# Patient Record
Sex: Female | Born: 1955 | Race: White | Hispanic: No | Marital: Single | Smoking: Current some day smoker
Health system: Southern US, Community
[De-identification: ages and names within clinical notes are randomized; demographics above are authoritative.]

## PROBLEM LIST (undated history)

## (undated) DIAGNOSIS — C50519 Malignant neoplasm of lower-outer quadrant of unspecified female breast: Secondary | ICD-10-CM

## (undated) DIAGNOSIS — G459 Transient cerebral ischemic attack, unspecified: Secondary | ICD-10-CM

## (undated) DIAGNOSIS — I671 Cerebral aneurysm, nonruptured: Secondary | ICD-10-CM

## (undated) DIAGNOSIS — I8390 Asymptomatic varicose veins of unspecified lower extremity: Secondary | ICD-10-CM

## (undated) DIAGNOSIS — T8859XA Other complications of anesthesia, initial encounter: Secondary | ICD-10-CM

## (undated) DIAGNOSIS — M1990 Unspecified osteoarthritis, unspecified site: Secondary | ICD-10-CM

## (undated) DIAGNOSIS — R52 Pain, unspecified: Secondary | ICD-10-CM

## (undated) DIAGNOSIS — F4323 Adjustment disorder with mixed anxiety and depressed mood: Secondary | ICD-10-CM

## (undated) DIAGNOSIS — C50919 Malignant neoplasm of unspecified site of unspecified female breast: Secondary | ICD-10-CM

## (undated) DIAGNOSIS — E785 Hyperlipidemia, unspecified: Secondary | ICD-10-CM

## (undated) DIAGNOSIS — T4145XA Adverse effect of unspecified anesthetic, initial encounter: Secondary | ICD-10-CM

## (undated) DIAGNOSIS — Z923 Personal history of irradiation: Secondary | ICD-10-CM

## (undated) DIAGNOSIS — D3A00 Benign carcinoid tumor of unspecified site: Secondary | ICD-10-CM

## (undated) DIAGNOSIS — Z9289 Personal history of other medical treatment: Secondary | ICD-10-CM

## (undated) DIAGNOSIS — J189 Pneumonia, unspecified organism: Secondary | ICD-10-CM

## (undated) DIAGNOSIS — B1910 Unspecified viral hepatitis B without hepatic coma: Secondary | ICD-10-CM

## (undated) DIAGNOSIS — N649 Disorder of breast, unspecified: Secondary | ICD-10-CM

## (undated) DIAGNOSIS — F419 Anxiety disorder, unspecified: Secondary | ICD-10-CM

## (undated) DIAGNOSIS — B1920 Unspecified viral hepatitis C without hepatic coma: Secondary | ICD-10-CM

## (undated) DIAGNOSIS — T7840XA Allergy, unspecified, initial encounter: Secondary | ICD-10-CM

## (undated) DIAGNOSIS — F329 Major depressive disorder, single episode, unspecified: Secondary | ICD-10-CM

## (undated) DIAGNOSIS — I10 Essential (primary) hypertension: Secondary | ICD-10-CM

## (undated) DIAGNOSIS — I639 Cerebral infarction, unspecified: Secondary | ICD-10-CM

## (undated) DIAGNOSIS — F32A Depression, unspecified: Secondary | ICD-10-CM

## (undated) DIAGNOSIS — R17 Unspecified jaundice: Secondary | ICD-10-CM

## (undated) HISTORY — DX: Hyperlipidemia, unspecified: E78.5

## (undated) HISTORY — PX: BREAST BIOPSY: SHX20

## (undated) HISTORY — DX: Transient cerebral ischemic attack, unspecified: G45.9

## (undated) HISTORY — DX: Asymptomatic varicose veins of unspecified lower extremity: I83.90

## (undated) HISTORY — DX: Unspecified jaundice: R17

## (undated) HISTORY — DX: Unspecified viral hepatitis C without hepatic coma: B19.20

## (undated) HISTORY — DX: Disorder of breast, unspecified: N64.9

## (undated) HISTORY — PX: DILATION AND CURETTAGE OF UTERUS: SHX78

---
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
## 2002-01-01 ENCOUNTER — Emergency Department (HOSPITAL_COMMUNITY): Admission: EM | Admit: 2002-01-01 | Discharge: 2002-01-01 | Payer: Self-pay | Admitting: Emergency Medicine

## 2002-01-01 ENCOUNTER — Encounter: Payer: Self-pay | Admitting: Emergency Medicine

## 2002-01-22 ENCOUNTER — Emergency Department (HOSPITAL_COMMUNITY): Admission: EM | Admit: 2002-01-22 | Discharge: 2002-01-22 | Payer: Self-pay | Admitting: Emergency Medicine

## 2002-01-23 ENCOUNTER — Emergency Department (HOSPITAL_COMMUNITY): Admission: EM | Admit: 2002-01-23 | Discharge: 2002-01-23 | Payer: Self-pay | Admitting: Emergency Medicine

## 2002-05-03 ENCOUNTER — Emergency Department (HOSPITAL_COMMUNITY): Admission: EM | Admit: 2002-05-03 | Discharge: 2002-05-03 | Payer: Self-pay | Admitting: Emergency Medicine

## 2002-06-08 ENCOUNTER — Emergency Department (HOSPITAL_COMMUNITY): Admission: EM | Admit: 2002-06-08 | Discharge: 2002-06-08 | Payer: Self-pay | Admitting: Emergency Medicine

## 2002-06-17 ENCOUNTER — Emergency Department (HOSPITAL_COMMUNITY): Admission: EM | Admit: 2002-06-17 | Discharge: 2002-06-17 | Payer: Self-pay | Admitting: Emergency Medicine

## 2003-02-10 ENCOUNTER — Emergency Department (HOSPITAL_COMMUNITY): Admission: EM | Admit: 2003-02-10 | Discharge: 2003-02-10 | Payer: Self-pay | Admitting: Emergency Medicine

## 2003-09-24 ENCOUNTER — Encounter: Admission: RE | Admit: 2003-09-24 | Discharge: 2003-09-24 | Payer: Self-pay | Admitting: Family Medicine

## 2004-03-30 ENCOUNTER — Emergency Department (HOSPITAL_COMMUNITY): Admission: EM | Admit: 2004-03-30 | Discharge: 2004-03-30 | Payer: Self-pay | Admitting: Emergency Medicine

## 2004-06-28 ENCOUNTER — Emergency Department (HOSPITAL_COMMUNITY): Admission: EM | Admit: 2004-06-28 | Discharge: 2004-06-28 | Payer: Self-pay | Admitting: Emergency Medicine

## 2007-10-30 ENCOUNTER — Emergency Department (HOSPITAL_COMMUNITY): Admission: EM | Admit: 2007-10-30 | Discharge: 2007-10-30 | Payer: Self-pay | Admitting: Emergency Medicine

## 2008-09-03 ENCOUNTER — Encounter: Admission: RE | Admit: 2008-09-03 | Discharge: 2008-09-03 | Payer: Self-pay | Admitting: Infectious Diseases

## 2008-09-07 ENCOUNTER — Encounter: Admission: RE | Admit: 2008-09-07 | Discharge: 2008-09-07 | Payer: Self-pay | Admitting: Infectious Diseases

## 2010-01-26 ENCOUNTER — Emergency Department (HOSPITAL_COMMUNITY): Admission: EM | Admit: 2010-01-26 | Discharge: 2010-01-26 | Payer: Self-pay | Admitting: Emergency Medicine

## 2010-09-11 DIAGNOSIS — I639 Cerebral infarction, unspecified: Secondary | ICD-10-CM

## 2010-09-11 DIAGNOSIS — I671 Cerebral aneurysm, nonruptured: Secondary | ICD-10-CM

## 2010-09-11 HISTORY — DX: Cerebral aneurysm, nonruptured: I67.1

## 2010-09-11 HISTORY — DX: Cerebral infarction, unspecified: I63.9

## 2010-10-01 ENCOUNTER — Encounter: Payer: Self-pay | Admitting: Family Medicine

## 2011-06-02 LAB — COMPREHENSIVE METABOLIC PANEL WITH GFR
ALT: 19
AST: 21
Albumin: 3.6
Alkaline Phosphatase: 54
BUN: 10
CO2: 24
Calcium: 8.8
Chloride: 108
Creatinine, Ser: 0.6
GFR calc Af Amer: >60
GFR calc non Af Amer: >60
Glucose, Bld: 108 — ABNORMAL HIGH
Potassium: 4
Sodium: 137
Total Bilirubin: 0.6
Total Protein: 6.3

## 2011-06-02 LAB — DIFFERENTIAL
Basophils Absolute: 0
Basophils Relative: 0
Eosinophils Absolute: 0.4
Eosinophils Relative: 4
Lymphocytes Relative: 25
Lymphs Abs: 2.3
Monocytes Absolute: 0.8
Monocytes Relative: 9
Neutro Abs: 5.7
Neutrophils Relative %: 62

## 2011-06-02 LAB — URINALYSIS, ROUTINE W REFLEX MICROSCOPIC
Nitrite: NEGATIVE
Specific Gravity, Urine: 1.025
Urobilinogen, UA: 0.2

## 2011-06-02 LAB — CBC
RBC: 4.47
WBC: 9.2

## 2011-06-12 DIAGNOSIS — G459 Transient cerebral ischemic attack, unspecified: Secondary | ICD-10-CM | POA: Insufficient documentation

## 2011-06-12 HISTORY — DX: Transient cerebral ischemic attack, unspecified: G45.9

## 2011-06-26 ENCOUNTER — Emergency Department (HOSPITAL_COMMUNITY): Payer: Self-pay

## 2011-06-26 ENCOUNTER — Emergency Department (HOSPITAL_COMMUNITY)
Admission: EM | Admit: 2011-06-26 | Discharge: 2011-06-27 | Disposition: A | Discharge status: Home / Self Care | Payer: Self-pay | Attending: Emergency Medicine | Admitting: Emergency Medicine

## 2011-06-26 DIAGNOSIS — R209 Unspecified disturbances of skin sensation: Secondary | ICD-10-CM | POA: Insufficient documentation

## 2011-06-26 DIAGNOSIS — R10812 Left upper quadrant abdominal tenderness: Secondary | ICD-10-CM | POA: Insufficient documentation

## 2011-06-26 DIAGNOSIS — G8929 Other chronic pain: Secondary | ICD-10-CM | POA: Insufficient documentation

## 2011-06-26 DIAGNOSIS — R4789 Other speech disturbances: Secondary | ICD-10-CM | POA: Insufficient documentation

## 2011-06-26 DIAGNOSIS — Z79899 Other long term (current) drug therapy: Secondary | ICD-10-CM | POA: Insufficient documentation

## 2011-06-26 DIAGNOSIS — R5381 Other malaise: Secondary | ICD-10-CM | POA: Insufficient documentation

## 2011-06-26 DIAGNOSIS — R109 Unspecified abdominal pain: Secondary | ICD-10-CM | POA: Insufficient documentation

## 2011-06-26 DIAGNOSIS — Z8619 Personal history of other infectious and parasitic diseases: Secondary | ICD-10-CM | POA: Insufficient documentation

## 2011-06-26 DIAGNOSIS — R42 Dizziness and giddiness: Secondary | ICD-10-CM | POA: Insufficient documentation

## 2011-06-26 DIAGNOSIS — R262 Difficulty in walking, not elsewhere classified: Secondary | ICD-10-CM | POA: Insufficient documentation

## 2011-06-26 DIAGNOSIS — G459 Transient cerebral ischemic attack, unspecified: Secondary | ICD-10-CM | POA: Insufficient documentation

## 2011-06-26 LAB — COMPREHENSIVE METABOLIC PANEL
ALT: 29 U/L (ref 0–35)
AST: 23 U/L (ref 0–37)
CO2: 23 mEq/L (ref 19–32)
Calcium: 9.5 mg/dL (ref 8.4–10.5)
Chloride: 108 mEq/L (ref 96–112)
Creatinine, Ser: 0.53 mg/dL (ref 0.50–1.10)
GFR calc Af Amer: >90 mL/min (ref >90)
GFR calc non Af Amer: >90 mL/min (ref >90)
Glucose, Bld: 105 mg/dL — ABNORMAL HIGH (ref 70–99)
Total Bilirubin: 0.6 mg/dL (ref 0.3–1.2)

## 2011-06-26 LAB — BASIC METABOLIC PANEL
BUN: 10 mg/dL (ref 6–23)
Creatinine, Ser: 0.61 mg/dL (ref 0.50–1.10)
GFR calc Af Amer: >90 mL/min (ref >90)
GFR calc non Af Amer: >90 mL/min (ref >90)
Glucose, Bld: 102 mg/dL — ABNORMAL HIGH (ref 70–99)

## 2011-06-26 LAB — CBC
HCT: 40.6 % (ref 36.0–46.0)
Hemoglobin: 13.6 g/dL (ref 12.0–15.0)
MCH: 28.8 pg (ref 26.0–34.0)
MCHC: 33.5 g/dL (ref 30.0–36.0)
MCV: 86 fL (ref 78.0–100.0)
RDW: 13.7 % (ref 11.5–15.5)

## 2011-06-26 LAB — CK TOTAL AND CKMB (NOT AT ARMC)
CK, MB: 2.3 ng/mL (ref 0.3–4.0)
Relative Index: INVALID (ref 0.0–2.5)

## 2011-06-26 LAB — DIFFERENTIAL
Eosinophils Relative: 1 % (ref 0–5)
Lymphocytes Relative: 24 % (ref 12–46)
Lymphs Abs: 1.8 10*3/uL (ref 0.7–4.0)
Monocytes Absolute: 0.4 10*3/uL (ref 0.1–1.0)
Monocytes Relative: 5 % (ref 3–12)

## 2011-06-26 LAB — URINALYSIS, ROUTINE W REFLEX MICROSCOPIC
Bilirubin Urine: NEGATIVE
Hgb urine dipstick: NEGATIVE
Ketones, ur: NEGATIVE mg/dL
Nitrite: NEGATIVE
pH: 6.5 (ref 5.0–8.0)

## 2011-06-26 LAB — GLUCOSE, CAPILLARY: Glucose-Capillary: 137 mg/dL — ABNORMAL HIGH (ref 70–99)

## 2011-06-27 ENCOUNTER — Other Ambulatory Visit (HOSPITAL_COMMUNITY): Payer: Self-pay

## 2011-06-27 DIAGNOSIS — I2699 Other pulmonary embolism without acute cor pulmonale: Secondary | ICD-10-CM

## 2011-06-27 DIAGNOSIS — M79609 Pain in unspecified limb: Secondary | ICD-10-CM

## 2011-06-27 LAB — CK TOTAL AND CKMB (NOT AT ARMC): CK, MB: 2 ng/mL (ref 0.3–4.0)

## 2011-06-27 LAB — LIPID PANEL
Cholesterol: 245 mg/dL — ABNORMAL HIGH (ref 0–200)
HDL: 67 mg/dL (ref >39)
Total CHOL/HDL Ratio: 3.7 RATIO
VLDL: 27 mg/dL (ref 0–40)

## 2011-06-27 LAB — HEMOGLOBIN A1C: Mean Plasma Glucose: 114 mg/dL (ref <117)

## 2011-11-22 ENCOUNTER — Other Ambulatory Visit: Payer: Self-pay | Admitting: Obstetrics and Gynecology

## 2011-11-22 DIAGNOSIS — Z1231 Encounter for screening mammogram for malignant neoplasm of breast: Secondary | ICD-10-CM

## 2011-11-28 ENCOUNTER — Other Ambulatory Visit: Payer: Self-pay | Admitting: Obstetrics and Gynecology

## 2011-11-28 ENCOUNTER — Ambulatory Visit (HOSPITAL_COMMUNITY): Payer: Medicaid Other | Attending: Obstetrics and Gynecology

## 2011-11-28 ENCOUNTER — Ambulatory Visit (INDEPENDENT_AMBULATORY_CARE_PROVIDER_SITE_OTHER): Payer: Self-pay | Admitting: *Deleted

## 2011-11-28 VITALS — BP 121/72 | HR 81 | Temp 97.0°F | Ht 66.0 in | Wt 162.9 lb

## 2011-11-28 DIAGNOSIS — N63 Unspecified lump in unspecified breast: Secondary | ICD-10-CM

## 2011-11-28 DIAGNOSIS — Z1239 Encounter for other screening for malignant neoplasm of breast: Secondary | ICD-10-CM

## 2011-11-28 DIAGNOSIS — N6323 Unspecified lump in the left breast, lower outer quadrant: Secondary | ICD-10-CM

## 2011-11-28 DIAGNOSIS — N6320 Unspecified lump in the left breast, unspecified quadrant: Secondary | ICD-10-CM

## 2011-11-28 NOTE — Progress Notes (Signed)
Complaints of lump in left breast for past 5 years that has increased in size within the last year. Lump palpated by physician at the free Breast and Pap smear screening at the [facility] on 11/06/11.   Pap Smear:    Pap smear not performed today. Patients last Pap smear was 11/06/11 at the free cervical cancer screening at the [facility] and awaiting results. Per patient she has no history of abnormal Pap smears. No Pap smear results in EPIC.  Physical exam: Breasts Breasts symmetrical. No skin abnormalities bilateral breasts. No nipple retraction bilateral breasts. No nipple discharge bilateral breasts. No lymphadenopathy. No lumps palpated right breast. Complaints of tenderness when palpated right outer breast. Palpated lump in the left breast at 5 o'clock. Patient complained of tenderness when palpated lump. Small lump palpated under left outer breast at bra line.  Patient referred to the [facility] for Diagnostic Mammogram and left breast ultrasound Friday, December 01, 2011 at 1430.         Pelvic/Bimanual No Pap smear completed today since last Pap smear was 11/06/11. Pap smear not indicated per BCCCP guidelines.

## 2011-11-28 NOTE — Patient Instructions (Addendum)
Taught patient how to perform BSE. Patient did not need a Pap smear today due to last Pap smear was at the free Pap smear screening at the [facility] 11/06/11 per patient. Let her know BCCCP will cover Pap smears every 3 years unless has a history of abnormal Pap smears. Patient is scheduled for a diagnostic mammogram at the [facility] Friday, December 01, 2011 at 1430. Patient aware of appointment and will be there. Smoking Cessation counseling was done. Patient stated has ordered nicotine patches online and plans to quit tomorrow. Told her about the free smoking cessation classes at the [facility] if interested. Let patient know will follow up with her within the next couple weeks with results. Patient verbalized understanding.

## 2011-12-01 ENCOUNTER — Other Ambulatory Visit: Payer: Self-pay | Admitting: Obstetrics and Gynecology

## 2011-12-01 ENCOUNTER — Ambulatory Visit
Admission: RE | Admit: 2011-12-01 | Discharge: 2011-12-01 | Disposition: A | Discharge status: Home / Self Care | Payer: No Typology Code available for payment source | Source: Ambulatory Visit | Attending: Obstetrics and Gynecology | Admitting: Obstetrics and Gynecology

## 2011-12-01 DIAGNOSIS — N6323 Unspecified lump in the left breast, lower outer quadrant: Secondary | ICD-10-CM

## 2011-12-06 ENCOUNTER — Ambulatory Visit
Admission: RE | Admit: 2011-12-06 | Discharge: 2011-12-06 | Disposition: A | Discharge status: Home / Self Care | Payer: No Typology Code available for payment source | Source: Ambulatory Visit | Attending: Obstetrics and Gynecology | Admitting: Obstetrics and Gynecology

## 2011-12-06 ENCOUNTER — Other Ambulatory Visit: Payer: Self-pay | Admitting: Obstetrics and Gynecology

## 2011-12-06 DIAGNOSIS — N6323 Unspecified lump in the left breast, lower outer quadrant: Secondary | ICD-10-CM

## 2011-12-24 ENCOUNTER — Emergency Department (HOSPITAL_COMMUNITY): Payer: Medicaid Other

## 2011-12-24 ENCOUNTER — Encounter (HOSPITAL_COMMUNITY): Payer: Self-pay | Admitting: Emergency Medicine

## 2011-12-24 ENCOUNTER — Emergency Department (HOSPITAL_COMMUNITY)
Admission: EM | Admit: 2011-12-24 | Discharge: 2011-12-24 | Disposition: A | Discharge status: Home / Self Care | Payer: Medicaid Other | Attending: Emergency Medicine | Admitting: Emergency Medicine

## 2011-12-24 DIAGNOSIS — Z8619 Personal history of other infectious and parasitic diseases: Secondary | ICD-10-CM | POA: Insufficient documentation

## 2011-12-24 DIAGNOSIS — E785 Hyperlipidemia, unspecified: Secondary | ICD-10-CM | POA: Insufficient documentation

## 2011-12-24 DIAGNOSIS — F419 Anxiety disorder, unspecified: Secondary | ICD-10-CM

## 2011-12-24 DIAGNOSIS — Z8673 Personal history of transient ischemic attack (TIA), and cerebral infarction without residual deficits: Secondary | ICD-10-CM | POA: Insufficient documentation

## 2011-12-24 DIAGNOSIS — R5381 Other malaise: Secondary | ICD-10-CM | POA: Insufficient documentation

## 2011-12-24 DIAGNOSIS — Z79899 Other long term (current) drug therapy: Secondary | ICD-10-CM | POA: Insufficient documentation

## 2011-12-24 DIAGNOSIS — F411 Generalized anxiety disorder: Secondary | ICD-10-CM | POA: Insufficient documentation

## 2011-12-24 DIAGNOSIS — F17200 Nicotine dependence, unspecified, uncomplicated: Secondary | ICD-10-CM | POA: Insufficient documentation

## 2011-12-24 DIAGNOSIS — R4789 Other speech disturbances: Secondary | ICD-10-CM | POA: Insufficient documentation

## 2011-12-24 DIAGNOSIS — R531 Weakness: Secondary | ICD-10-CM

## 2011-12-24 LAB — CBC
MCH: 28.7 pg (ref 26.0–34.0)
MCV: 87.5 fL (ref 78.0–100.0)
Platelets: 241 10*3/uL (ref 150–400)
RDW: 13.5 % (ref 11.5–15.5)
WBC: 8 10*3/uL (ref 4.0–10.5)

## 2011-12-24 LAB — POCT I-STAT, CHEM 8
Calcium, Ion: 1.18 mmol/L (ref 1.12–1.32)
HCT: 40 % (ref 36.0–46.0)
TCO2: 20 mmol/L (ref 0–100)

## 2011-12-24 LAB — COMPREHENSIVE METABOLIC PANEL
ALT: 22 U/L (ref 0–35)
AST: 20 U/L (ref 0–37)
Alkaline Phosphatase: 67 U/L (ref 39–117)
CO2: 20 mEq/L (ref 19–32)
Chloride: 107 mEq/L (ref 96–112)
Creatinine, Ser: 0.78 mg/dL (ref 0.50–1.10)
GFR calc non Af Amer: >90 mL/min (ref >90)
Sodium: 138 mEq/L (ref 135–145)
Total Bilirubin: 0.3 mg/dL (ref 0.3–1.2)

## 2011-12-24 LAB — RAPID URINE DRUG SCREEN, HOSP PERFORMED
Amphetamines: NOT DETECTED
Barbiturates: NOT DETECTED
Benzodiazepines: POSITIVE — AB
Cocaine: NOT DETECTED
Tetrahydrocannabinol: NOT DETECTED

## 2011-12-24 LAB — GLUCOSE, CAPILLARY

## 2011-12-24 LAB — DIFFERENTIAL
Basophils Absolute: 0 10*3/uL (ref 0.0–0.1)
Lymphocytes Relative: 35 % (ref 12–46)
Monocytes Absolute: 0.4 10*3/uL (ref 0.1–1.0)
Neutro Abs: 4.7 10*3/uL (ref 1.7–7.7)

## 2011-12-24 LAB — PROTIME-INR
INR: 0.93 (ref 0.00–1.49)
Prothrombin Time: 12.7 seconds (ref 11.6–15.2)

## 2011-12-24 LAB — APTT: aPTT: 27 s (ref 24–37)

## 2011-12-24 MED ORDER — ALPRAZOLAM 0.5 MG PO TABS
0.5000 mg | ORAL_TABLET | Freq: Once | ORAL | Status: AC
  Administered 2011-12-24: 0.5 mg via ORAL
  Filled 2011-12-24: qty 1

## 2011-12-24 NOTE — Discharge Instructions (Signed)
The neurologist recommends that you take an enteric coated aspirin a day. Stop smoking. For anxiety/stress, get adequate sleep, eat balanced diet, try exercise/yoga. You may also take your xanax as need. No driving for the next 6 hours or when taking xanax.  Follow up with primary care doctor in coming week - see resource guide. Return to ER if worse, new symptoms, one sided weakness, change in speech or vision, severe anxiety or depression, other concern.     RESOURCE GUIDE  Dental Problems  Patients with Medicaid: [facility] [phone] [address]                                           [phone] [address] Phone:  [phone]                                                  Phone:  [phone]  If unable to pay or uninsured, contact:  [facility] or [facility]. to become qualified for the adult dental clinic.  Chronic Pain Problems Contact [facility]  [phone] Patients need to be referred by their primary care doctor.  Insufficient Money for Medicine Contact [employer] Way:  call "211" or [facility] 531-365-8766.  No Primary Care Doctor Call [facility]  9843301643 Other agencies that provide inexpensive medical care    [facility]  [phone]    [facility]  [phone]    [facility]  [phone]    [facility]  [phone]    [facility]  4180874654    [facility]  [phone]  Psychological Services [facility]  [phone] [facility]  [phone] [facility]   [phone] (emergency services [phone])  Substance Abuse Resources [facility]  [phone] [facility] [phone] [address] [place] [phone] Floydene Flock [phone] Residential & Outpatient Substance Abuse Program  [phone]  Abuse/Neglect [facility] Child Abuse Hotline (418)636-6750 [facility] Child Abuse Hotline (724)132-2589 (After Hours)  Emergency Shelter [facility] [phone]  Maternity Homes [facility] [place] [facility] [place] [phone] [facility] Services ([phone]  MRSA Hotline #:   862-383-8607    [facility] Resources  [facility] [place]     [employer]                          [facility] [address] [place]                       [address]      [address]  Patrecia Pace  Michell Heinrich Phone:  [phone]                                   Phone:  [phone]                 Phone:  [phone]  [facility] Phone:  [phone]  [facility] Child Abuse Hotline [phone] (704)[phone] (After Hours)         Anxiety and Panic Attacks Your caregiver has informed you that you are having an anxiety or panic attack. There may be many forms of this. Most of the time these attacks come suddenly and without warning. They come at any time of day, including periods of sleep, and at any time of life. They may be strong and unexplained. Although panic attacks are very scary, they are physically harmless. Sometimes the cause of your anxiety is not known. Anxiety is a protective mechanism of the body in its fight or flight mechanism. Most of these perceived danger situations are actually nonphysical situations (such as anxiety over losing a job). CAUSES  The causes of an anxiety or panic attack are many. Panic attacks may occur in otherwise healthy people given a certain set of circumstances. There may be a genetic cause for panic attacks. Some medications may also have anxiety as a side effect. SYMPTOMS  Some of the most common feelings are:  Intense terror.   Dizziness, feeling faint.   Hot and cold flashes.   Fear of going crazy.   Feelings that nothing is  real.   Sweating.   Shaking.   Chest pain or a fast heartbeat (palpitations).   Smothering, choking sensations.   Feelings of impending doom and that death is near.   Tingling of extremities, this may be from over-breathing.   Altered reality (derealization).   Being detached from yourself (depersonalization).  Several symptoms can be present to make up anxiety or panic attacks. DIAGNOSIS  The evaluation by your caregiver will depend on the type of symptoms you are experiencing. The diagnosis of anxiety or panic attack is made when no physical illness can be determined to be a cause of the symptoms. TREATMENT  Treatment to prevent anxiety and panic attacks may include:  Avoidance of circumstances that cause anxiety.   Reassurance and relaxation.   Regular exercise.   Relaxation therapies, such as yoga.   Psychotherapy with a psychiatrist or therapist.   Avoidance of caffeine, alcohol and illegal drugs.   Prescribed medication.  SEEK IMMEDIATE MEDICAL CARE IF:   You experience panic attack symptoms that are different than your usual symptoms.   You have any worsening or concerning symptoms.  Document Released: 08/28/2005 Document Revised: 08/17/2011 Document Reviewed: 12/30/2009 [employer] Patient Information 2012 [place], [place].

## 2011-12-24 NOTE — ED Notes (Signed)
Pt to room 6 from triage at 1900. Pt placed on zoll. Pt to CT 3 with RN and NT at 1901. Lab at bedside 1902. Rapid Response to bedside at 1903. Neuro at bedside 1904. Pt back to room 6 at 1909. IV established pt being placed in gown and on monitor.

## 2011-12-24 NOTE — ED Notes (Signed)
Pt remains alertx4 at this time. Still has some slurred speech, Movement in all extremities.

## 2011-12-24 NOTE — ED Notes (Signed)
Pt tolerated ambulation well. Pt states she did not want fluids at this time, but did tolerate fluids well during stroke swallow screen. Pt remains alertx4, NAD, only request is referral to [facility] for follow up.

## 2011-12-24 NOTE — ED Notes (Signed)
Code Stroke activated.  [facility] notified per RN karen N.

## 2011-12-24 NOTE — Code Documentation (Signed)
56 yo wf brought in via pvt vehicle for stroke symptoms. LSN unknown, Code stroke called 1900, Pt arrival 1852, EDP exam 1856, Stroke team arrival 1903, to CT 1901, lab 1902, Code stroke canceled 1935.  NIH 1 for mild sensory loss.

## 2011-12-24 NOTE — ED Notes (Addendum)
C/o difficulty speaking and L sided weakness onset at 5pm.  L grip weaker than R.  Code Stroke initiated and pt straight back.  Pt reports mild headache.

## 2011-12-24 NOTE — Consult Note (Signed)
Chief Complaint: Left-sided weakness and numbness  HPI: [name] is an 56 y.o. female history hyperlipidemia, TIA in October 2012, and chronic anxiety and depression, presenting with new onset of left-sided numbness as well as complaint of left-sided weakness. Exact onset is unclear. Patient was noted to be very anxious by her son who brought her to the emergency room. She was complaining of left side weakness and numbness as well as speech difficulty. She has not been compliant with taking aspirin but to aspirin tablets prior to coming to the emergency room. CT scan of the head showed no acute intracranial abnormality. 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 No intervention is indicated. NIH stroke score was 1 for subjective sensory changes on the left side  LSN: Approximately 3 PM today tPA Given: No: No clear objective deficits MRankin: 0  Past Medical History  Diagnosis Date  . Hepatitis C   . Jaundice   . Mental disorder     anxiety and depression  . Breast disorder     lump in left breast  . TIA (transient ischemic attack) OCT 2012  . Hyperlipidemia   . Varicose veins     Family History  Problem Relation Age of Onset  . Diabetes Paternal Grandfather   . Diabetes Paternal Grandmother   . Cancer Father     lung  . Diabetes Mother   . Heart disease Mother   . Cancer Mother     cervical  . Diabetes Brother   . Stroke Brother   . Heart disease Sister   . Diabetes Sister   . Cancer Sister     cervical     Medications: Prior to Admission:  Xanax 0.5 mg twice a day when necessary Norco 5 mg one tablet every 8 hours when necessary pain   Physical Examination: Blood pressure 166/83, pulse 67, temperature 97.9 F (36.6 C), temperature source Oral, resp. rate 20, SpO2 99.00%.  Neurologic Examination: Mental Status: Alert, oriented, thought content appropriate.  Speech fluent without evidence of aphasia. Able to follow commands  without difficulty. Cranial Nerves: II-Visual fields were normal. III/IV/VI-Pupils were equal and reacted. Extraocular movements were full and conjugate.    V/VII-inconsistent left facial sensory perception abnormality detected stimulation; no facial weakness. VIII-normal. X-normal speech and symmetrical palatal movement. XII-midline tongue extension Motor: Normal strength and muscle tone throughout Sensory: Reduced perception tactile sensation over left extremities compared to the right. Deep Tendon Reflexes: 2+ and symmetric. Plantars: Flexor bilaterally Cerebellar: Normal finger-to-nose and heel-to-shin testing.  Ct Head Wo Contrast  12/24/2011  *RADIOLOGY REPORT*  Clinical Data: Code stroke.  Left-sided weakness with slurred speech.  Cannot see out of left eye.  CT HEAD WITHOUT CONTRAST  Technique:  Contiguous axial images were obtained from the base of the skull through the vertex without contrast.  Comparison: Head CT 06/26/2011 brain MRI 06/26/2011  Findings: No intracranial hemorrhage is identified.  The ventricles are normal in stable in size.  There is no evidence of mass, mass effect, hydrocephalus, or evidence of acute cortically based infarction.  The visualized paranasal sinuses, mastoid air cells, and middle ears are clear.  The skull is intact.  IMPRESSION: No acute intracranial abnormality is identified. This report was called to [facility] in the ED and Dr. Roseanne Reno at 7:25 pm on 12/23/2012.  Original Report Authenticated By: Britta Mccreedy, M.D.    Assessment: 56 y.o. female presenting with subjective weakness and sensory changes involving the left  side. No objective deficit was demonstrated on examination. No clinical indications TIA nor acute stroke. CT scan of her head was unremarkable. Suspect significant psychophysiologic factors contributing to her presenting symptoms, consistent with her history of anxiety which according to family members has been worse than usual.   Stroke  Risk Factors - hyperlipidemia  Plan: 1. No further neurodiagnostic studies are indicated. 2. Increase Xanax to 1 mg twice a day when necessary anxiety 3. Resume aspirin 325 mg per day for TIA and stroke prophylaxis.  C.R. Roseanne Reno, MD [facility] [phone]  12/24/2011, 8:16 PM

## 2011-12-24 NOTE — ED Provider Notes (Signed)
History     CSN: [id number]  Arrival date & time 12/24/11  4098   First MD Initiated Contact with Patient 12/24/11 1901      Chief Complaint  Patient presents with  . Code Stroke    (Consider location/radiation/quality/duration/timing/severity/associated sxs/prior treatment) The history is provided by the patient.  pt with hx hep c, presents stating onset approximately 5 pm of slurred speech and left sided weakness. States symptoms persistent since onset but improving. Denies numbness. States had slight frontal headache earlier but that resolved. Denies change in vision. Denies trauma or fall. Denies problems w balance or coordination. No hx cva. States uncertain fam hx cva. Pt denies exacerbating or alleviating factors. States has been under great deal of stress lately.   Past Medical History  Diagnosis Date  . Hepatitis C   . Jaundice   . Mental disorder     anxiety and depression  . Breast disorder     lump in left breast  . TIA (transient ischemic attack) OCT 2012  . Hyperlipidemia   . Varicose veins     History reviewed. No pertinent past surgical history.  Family History  Problem Relation Age of Onset  . Diabetes Paternal Grandfather   . Diabetes Paternal Grandmother   . Cancer Father     lung  . Diabetes Mother   . Heart disease Mother   . Cancer Mother     cervical  . Diabetes Brother   . Stroke Brother   . Heart disease Sister   . Diabetes Sister   . Cancer Sister     cervical    History  Substance Use Topics  . Smoking status: Current Everyday Smoker -- 0.5 packs/day for 35 years  . Smokeless tobacco: Never Used  . Alcohol Use: Yes     seldom    OB History    Grav Para Term Preterm Abortions TAB SAB Ect Mult Living   5 4  2 1 1    4       Review of Systems  Constitutional: Negative for fever and chills.  HENT: Negative for neck pain.   Eyes: Negative for visual disturbance.  Respiratory: Negative for shortness of breath.   Cardiovascular:  Negative for chest pain.  Gastrointestinal: Negative for abdominal pain.  Genitourinary: Negative for flank pain.  Musculoskeletal: Negative for back pain.  Skin: Negative for rash.  Neurological: Negative for seizures and light-headedness.  Hematological: Does not bruise/bleed easily.  Psychiatric/Behavioral: Negative for agitation.    Allergies  Review of patient's allergies indicates no known allergies.  Home Medications   Current Outpatient Rx  Name Route Sig Dispense Refill  . ALPRAZOLAM 1 MG PO TABS Oral Take 1 mg by mouth 2 (two) times daily as needed.    Marland Kitchen HYDROCODONE-ACETAMINOPHEN 10-325 MG PO TABS Oral Take 1 tablet by mouth every 8 (eight) hours as needed.    Marland Kitchen LOVASTATIN 20 MG PO TABS Oral Take 20 mg by mouth at bedtime.    . SERTRALINE HCL 50 MG PO TABS Oral Take 50 mg by mouth daily.      BP 166/83  Pulse 67  Temp(Src) 98.3 F (36.8 C) (Oral)  Resp 20  SpO2 99%  Physical Exam  Nursing note and vitals reviewed. Constitutional: She is oriented to person, place, and time. She appears well-developed and well-nourished. No distress.  HENT:  Head: Atraumatic.  Nose: Nose normal.  Mouth/Throat: Oropharynx is clear and moist.       No temporal tenderness  Eyes: Conjunctivae and EOM are normal. Pupils are equal, round, and reactive to light. No scleral icterus.  Neck: Normal range of motion. Neck supple. No tracheal deviation present.       No bruit  Cardiovascular: Normal rate, regular rhythm, normal heart sounds and intact distal pulses.  Exam reveals no gallop and no friction rub.   No murmur heard. Pulmonary/Chest: Effort normal and breath sounds normal. No respiratory distress.  Abdominal: Soft. Normal appearance and bowel sounds are normal. She exhibits no distension. There is no tenderness.  Musculoskeletal: Normal range of motion. She exhibits no edema and no tenderness.  Neurological: She is alert and oriented to person, place, and time. She displays normal  reflexes. No cranial nerve deficit.       Pt w dysarthric quality of speech which changes from second to second, minute to minute during H and P. No facial droop/asymmetry. No cr n deficit noted. Motor intact bil. Pt noted to hold left arm up against gravity for 20+ seconds while described left weakness, when testing strength states unable to lift.   Skin: Skin is warm and dry. No rash noted.  Psychiatric:       Anxious, tearful.     ED Course  Procedures (including critical care time)   Labs Reviewed  GLUCOSE, CAPILLARY  CBC  DIFFERENTIAL  POCT I-STAT, CHEM 8  PROTIME-INR  APTT  COMPREHENSIVE METABOLIC PANEL  CK TOTAL AND CKMB  TROPONIN I  URINE RAPID DRUG SCREEN (HOSP PERFORMED)    Results for orders placed during the hospital encounter of 12/24/11  GLUCOSE, CAPILLARY      Component Value Range   Glucose-Capillary 98  70 - 99 (mg/dL)   Comment 1 Notify RN     Comment 2 Documented in Chart    CBC      Component Value Range   WBC 8.0  4.0 - 10.5 (K/uL)   RBC 4.49  3.87 - 5.11 (MIL/uL)   Hemoglobin 12.9  12.0 - 15.0 (g/dL)   HCT 16.1  09.6 - 04.5 (%)   MCV 87.5  78.0 - 100.0 (fL)   MCH 28.7  26.0 - 34.0 (pg)   MCHC 32.8  30.0 - 36.0 (g/dL)   RDW 40.9  81.1 - 91.4 (%)   Platelets 241  150 - 400 (K/uL)  DIFFERENTIAL      Component Value Range   Neutrophils Relative 58  43 - 77 (%)   Neutro Abs 4.7  1.7 - 7.7 (K/uL)   Lymphocytes Relative 35  12 - 46 (%)   Lymphs Abs 2.8  0.7 - 4.0 (K/uL)   Monocytes Relative 5  3 - 12 (%)   Monocytes Absolute 0.4  0.1 - 1.0 (K/uL)   Eosinophils Relative 2  0 - 5 (%)   Eosinophils Absolute 0.2  0.0 - 0.7 (K/uL)   Basophils Relative 0  0 - 1 (%)   Basophils Absolute 0.0  0.0 - 0.1 (K/uL)  POCT I-STAT, CHEM 8      Component Value Range   Sodium 143  135 - 145 (mEq/L)   Potassium 3.8  3.5 - 5.1 (mEq/L)   Chloride 112  96 - 112 (mEq/L)   BUN 11  6 - 23 (mg/dL)   Creatinine, Ser 7.82  0.50 - 1.10 (mg/dL)   Glucose, Bld 91  70 - 99  (mg/dL)   Calcium, Ion 9.56  2.13 - 1.32 (mmol/L)   TCO2 20  0 - 100 (mmol/L)   Hemoglobin  13.6  12.0 - 15.0 (g/dL)   HCT 45.4  09.8 - 11.9 (%)   Ct Head Wo Contrast  12/24/2011  *RADIOLOGY REPORT*  Clinical Data: Code stroke.  Left-sided weakness with slurred speech.  Cannot see out of left eye.  CT HEAD WITHOUT CONTRAST  Technique:  Contiguous axial images were obtained from the base of the skull through the vertex without contrast.  Comparison: Head CT 06/26/2011 brain MRI 06/26/2011  Findings: No intracranial hemorrhage is identified.  The ventricles are normal in stable in size.  There is no evidence of mass, mass effect, hydrocephalus, or evidence of acute cortically based infarction.  The visualized paranasal sinuses, mastoid air cells, and middle ears are clear.  The skull is intact.  IMPRESSION: No acute intracranial abnormality is identified. This report was called to [facility] in the ED and Dr. Roseanne Reno at 7:25 pm on 12/23/2012.  Original Report Authenticated By: Britta Mccreedy, M.D.    MDM  Pt called code stroke on arrival. Code stroke and neurologist in with patient.   Ct done. Labs pending.   Neurology has evaluated patient - Dr Roseanne Reno indicates no cva/tia, and that likely anxiety/stress reaction. States no further neuro workup needed, but states would advise to restart asa a day.   Of note, pt w same symptoms 10/12, had extensive workup then including ct, mri, mra, carotid dopplers, echo. Dr Roseanne Reno states the possible 2 mm cavernous ica anuerysm on prior mra of no clinical relevance and unrelated to todays symptoms.   Pt provided reassurance. Is anxious. Xanax po.     Date: 12/24/2011  Rate: 65  Rhythm: normal sinus rhythm  QRS Axis: normal  Intervals: normal  ST/T Wave abnormalities: normal  Conduction Disutrbances:none  Narrative Interpretation:   Old EKG Reviewed: unchanged    Pt ambulatory about the ED with steady gait. Calm and alert. No depressed mood/affect. States  feels ready for d/c.      Suzi Roots, MD 12/24/11 2217

## 2011-12-24 NOTE — ED Notes (Signed)
Pt reports had a headache earlier and took 2 ASAs. Pt reports vision is blurred to left side. EDP and Neuro at bedside. Pt reports tingling to left hand.

## 2011-12-25 LAB — GLUCOSE, CAPILLARY: Glucose-Capillary: 86 mg/dL (ref 70–99)

## 2012-03-11 ENCOUNTER — Emergency Department (HOSPITAL_COMMUNITY)
Admission: EM | Admit: 2012-03-11 | Discharge: 2012-03-11 | Discharge status: Against Medical Advice | Payer: Medicaid Other | Attending: Emergency Medicine | Admitting: Emergency Medicine

## 2012-03-11 ENCOUNTER — Encounter (HOSPITAL_COMMUNITY): Payer: Self-pay | Admitting: *Deleted

## 2012-03-11 DIAGNOSIS — R0602 Shortness of breath: Secondary | ICD-10-CM | POA: Insufficient documentation

## 2012-03-11 HISTORY — DX: Cerebral aneurysm, nonruptured: I67.1

## 2012-03-11 LAB — CBC
MCHC: 33.8 g/dL (ref 30.0–36.0)
MCV: 85.2 fL (ref 78.0–100.0)
Platelets: 203 10*3/uL (ref 150–400)
RDW: 13.7 % (ref 11.5–15.5)
WBC: 7.2 10*3/uL (ref 4.0–10.5)

## 2012-03-11 LAB — POCT PREGNANCY, URINE: Preg Test, Ur: NEGATIVE

## 2012-03-11 LAB — BASIC METABOLIC PANEL
BUN: 10 mg/dL (ref 6–23)
CO2: 21 mEq/L (ref 19–32)
Calcium: 9.5 mg/dL (ref 8.4–10.5)
Creatinine, Ser: 0.63 mg/dL (ref 0.50–1.10)
GFR calc Af Amer: >90 mL/min (ref >90)

## 2012-03-11 NOTE — ED Notes (Signed)
Pt reports possible spider bite on back of neck - states she woke up last night to insect crawling on neck. Reports stiff neck, throbbing headache, sob, chest "tightness" radiating across chest, swollen lymph node on right side of neck. States she tried to see pcp today, was seen by NP - confirmed spider bite, prescribed antibiotic and tramadol. Pt states hx of brain aneurysm, worried about headache.

## 2012-03-21 ENCOUNTER — Emergency Department (INDEPENDENT_AMBULATORY_CARE_PROVIDER_SITE_OTHER): Payer: Medicaid Other

## 2012-03-21 ENCOUNTER — Emergency Department (HOSPITAL_COMMUNITY)
Admission: EM | Admit: 2012-03-21 | Discharge: 2012-03-21 | Disposition: A | Discharge status: Home / Self Care | Payer: Medicaid Other | Source: Home / Self Care | Attending: Emergency Medicine | Admitting: Emergency Medicine

## 2012-03-21 ENCOUNTER — Encounter (HOSPITAL_COMMUNITY): Payer: Self-pay | Admitting: *Deleted

## 2012-03-21 DIAGNOSIS — L0390 Cellulitis, unspecified: Secondary | ICD-10-CM

## 2012-03-21 DIAGNOSIS — S92919A Unspecified fracture of unspecified toe(s), initial encounter for closed fracture: Secondary | ICD-10-CM

## 2012-03-21 DIAGNOSIS — Z23 Encounter for immunization: Secondary | ICD-10-CM

## 2012-03-21 DIAGNOSIS — T148XXA Other injury of unspecified body region, initial encounter: Secondary | ICD-10-CM

## 2012-03-21 MED ORDER — LIDOCAINE HCL (PF) 1 % IJ SOLN
INTRAMUSCULAR | Status: AC
  Filled 2012-03-21: qty 5

## 2012-03-21 MED ORDER — TETANUS-DIPHTH-ACELL PERTUSSIS 5-2.5-18.5 LF-MCG/0.5 IM SUSP
0.5000 mL | Freq: Once | INTRAMUSCULAR | Status: AC
  Administered 2012-03-21: 0.5 mL via INTRAMUSCULAR

## 2012-03-21 MED ORDER — AMOXICILLIN-POT CLAVULANATE 875-125 MG PO TABS: 1.0000 | ORAL_TABLET | Freq: Two times a day (BID) | ORAL | Status: AC

## 2012-03-21 MED ORDER — CEFTRIAXONE SODIUM 1 G IJ SOLR
1.0000 g | Freq: Once | INTRAMUSCULAR | Status: AC
  Administered 2012-03-21: 1 g via INTRAMUSCULAR

## 2012-03-21 MED ORDER — TETANUS-DIPHTH-ACELL PERTUSSIS 5-2.5-18.5 LF-MCG/0.5 IM SUSP
INTRAMUSCULAR | Status: AC
  Filled 2012-03-21: qty 0.5

## 2012-03-21 MED ORDER — CIPROFLOXACIN HCL 500 MG PO TABS: 500.0000 mg | ORAL_TABLET | Freq: Two times a day (BID) | ORAL | Status: AC

## 2012-03-21 MED ORDER — CEFTRIAXONE SODIUM 1 G IJ SOLR
INTRAMUSCULAR | Status: AC
  Filled 2012-03-21: qty 10

## 2012-03-21 MED ORDER — TRAMADOL HCL 50 MG PO TABS: 100.0000 mg | ORAL_TABLET | Freq: Three times a day (TID) | ORAL | Status: AC | PRN

## 2012-03-21 NOTE — ED Provider Notes (Signed)
Chief Complaint  Patient presents with  . Puncture Wound  . Foot Pain  . Foot Injury    History of Present Illness:  [name] is a 56 year old female who experienced a puncture wound to the plantar surface of her right foot 3 days ago. She was chasing a kitten and a stepped on a board that had to rest the nail is protruding. She was wearing a tennis shoe at the time. Her granddaughter pulled out the board with nails attached and she cleansed it with some soap and antibiotic ointment. Ever since then it's been painful, red, and swollen with some redness and swelling extending up onto the dorsum of the foot. She is on some clindamycin for a spider bite. She denies any fever or chills. There's no red streak running up her leg. No numbness or tingling. She cannot recall when her last tetanus vaccine was.  Because of the pain in the foot, she has been limping around the last couple of days and subsequently yesterday, struck the little toe of her right foot. She's had a little bit of pain over the little toe as well.  Review of Systems:  Other than noted above, the patient denies any of the following symptoms: Systemic:  No fever or chills. Musculoskeletal:  No joint pain or decreased range of motion. Neuro:  No numbness, tingling, or weakness.  PMFSH:  Past medical history, family history, social history, meds, and allergies were reviewed.  Physical Exam:   Vital signs:  BP 115/81  Pulse 68  Temp 97.6 F (36.4 C) (Oral)  Resp 18  SpO2 97% Ext:  She has 2 puncture wounds in the plantar surface of the foot near the metatarsal heads. There is no purulent drainage, there is some surrounding swelling, erythema, and tenderness to palpation. She also has tenderness to palpation over the dorsum of the foot as well measuring 5 x 5 cm. This was outlined with a skin marking pen.  All joints had a full ROM without pain.  Pulses were full.  Good capillary refill in all digits.  No edema. Neurological:  Alert  and oriented.  No muscle weakness.  Sensation was intact to light touch.    Plantar surface of foot showing 2 puncture wounds.   Dorsal surface of the foot showing swelling and erythema.   Dorsal surface of the foot after marking with a skin marking pen showing a 5 cm x 5 cm area of erythema.  Dg Foot Complete Right  03/21/2012  *RADIOLOGY REPORT*  Clinical Data: Rule out foreign body  RIGHT FOOT COMPLETE - 3+ VIEW  Comparison: None.  Findings: Nondisplaced transverse fracture through the proximal phalanx of the small toe.  Associated soft tissue swelling.  No radiopaque foreign body in the soft tissues.  IMPRESSION: Nondisplaced fracture involving the proximal phalanx of the small toe.  No definite radiopaque foreign body.  Original Report Authenticated By: Donavan Burnet, M.D.    X-ray of foot showing fracture of the proximal phalanx of the little toe.  Medications given in [facility]:  She was given a Tdap vaccine and Rocephin 1 g IM and tolerated these well without any immediate side effects.  Assessment:  The primary encounter diagnosis was Puncture wound. Diagnoses of Cellulitis and Fracture of toe were also pertinent to this visit. I do not think that the fracture of the little toe is directly caused by the puncture wound, but rather as a result of limping around on the foot because of the pain  and striking the little toe the day after the puncture wound.  Plan:   1.  The following meds were prescribed:   New Prescriptions   AMOXICILLIN-CLAVULANATE (AUGMENTIN) 875-125 MG PER TABLET    Take 1 tablet by mouth 2 (two) times daily.   CIPROFLOXACIN (CIPRO) 500 MG TABLET    Take 1 tablet (500 mg total) by mouth every 12 (twelve) hours.   TRAMADOL (ULTRAM) 50 MG TABLET    Take 2 tablets (100 mg total) by mouth every 8 (eight) hours as needed for pain.   2.  The patient was instructed in wound care and pain control, and handouts were given. 3.  The patient was told to return in 48 hours for  recheck.   Reuben Likes, MD 03/21/12 2030

## 2012-03-21 NOTE — ED Notes (Signed)
Pt stepped on two nails Tuesday has two puncture wounds bottom of right foot - now with pain/swelling/redness - last tetnus unknown

## 2012-03-24 ENCOUNTER — Encounter (HOSPITAL_COMMUNITY): Payer: Self-pay

## 2012-03-24 ENCOUNTER — Emergency Department (HOSPITAL_COMMUNITY)
Admission: EM | Admit: 2012-03-24 | Discharge: 2012-03-24 | Disposition: A | Discharge status: Home / Self Care | Payer: Medicaid Other | Source: Home / Self Care | Attending: Emergency Medicine | Admitting: Emergency Medicine

## 2012-03-24 DIAGNOSIS — T148XXA Other injury of unspecified body region, initial encounter: Secondary | ICD-10-CM

## 2012-03-24 NOTE — ED Provider Notes (Signed)
Chief Complaint  Patient presents with  . Follow-up    History of Present Illness:   [name] is a 55 -year-old female who was seen here 3 days ago with a puncture wound to her right foot.this had been sustained a couple days prior to her presentation here while she was chasing a cat. She actually had 2 puncture wounds on the plantar surface of the foot from to rusty nails nails that were sticking in a board. The reason that she came in was because the wounds were getting red and inflamed and additionally, she was developing some redness and inflammation over the dorsum of the foot. She was placed on Cipro since she was wearing tennis shoes at the time and cephalexin. She couldn't tolerate the Cipro because of some palpitations but she has been taking the cephalexin. It still little bit sore and tender to touch, but the swelling and redness have gone down and she denies any fever or red streak running up the leg. There's been no purulent drainage.   Last tetanus shot was given at her last visit here, 3 days ago.   Additionally, she was found to have a fracture of the proximal phalanx of her right little toe which he probably sustained while she was limping on her foot because of the pain. She has been wearing a buddy tape and a postoperative shoe.  Review of Systems:  Other than noted above, the patient denies any of the following symptoms: Systemic:  No fever or chills. Musculoskeletal:  No joint pain or decreased range of motion. Neuro:  No numbness, tingling, or weakness.  PMFSH:  Past medical history, family history, social history, meds, and allergies were reviewed.  Physical Exam:   Vital signs:  BP 154/93  Pulse 62  Temp 97.7 F (36.5 C) (Oral)  Resp 17  SpO2 98% Ext:   her foot looks good today. There is no swelling, redness, and only minimal tenderness to palpation around the puncture wounds themselves. There is no purulent drainage.   All joints had a full ROM without pain.  Pulses  were full.  Good capillary refill in all digits.  No edema.The right little toe was buddy taped and she is wearing a postoperative shoe.  Neurological:  Alert and oriented.  No muscle weakness.  Sensation was intact to light touch.   Assessment:  The encounter diagnosis was Puncture wound.  Plan:   1.  The following meds were prescribed:   New Prescriptions   No medications on file   2.  The patient was instructed in wound care and pain control, and handouts were given. 3.  The patient was told to return if any sign of infection.      Reuben Likes, MD 03/24/12 [phone]

## 2012-03-24 NOTE — ED Notes (Signed)
Pt here for follow-up for rt foot, states the redness is much improved.  She stopped the cipro after two doses because of tachycardia.

## 2012-10-22 ENCOUNTER — Encounter: Payer: Self-pay | Admitting: Internal Medicine

## 2012-11-06 DIAGNOSIS — R17 Unspecified jaundice: Secondary | ICD-10-CM | POA: Insufficient documentation

## 2012-11-06 DIAGNOSIS — I671 Cerebral aneurysm, nonruptured: Secondary | ICD-10-CM | POA: Insufficient documentation

## 2012-11-06 DIAGNOSIS — N6320 Unspecified lump in the left breast, unspecified quadrant: Secondary | ICD-10-CM | POA: Insufficient documentation

## 2012-11-06 DIAGNOSIS — E785 Hyperlipidemia, unspecified: Secondary | ICD-10-CM | POA: Insufficient documentation

## 2012-11-06 DIAGNOSIS — I8390 Asymptomatic varicose veins of unspecified lower extremity: Secondary | ICD-10-CM | POA: Insufficient documentation

## 2012-11-06 DIAGNOSIS — B1920 Unspecified viral hepatitis C without hepatic coma: Secondary | ICD-10-CM | POA: Insufficient documentation

## 2012-11-08 ENCOUNTER — Institutional Professional Consult (permissible substitution): Payer: Medicaid Other | Admitting: Cardiovascular Disease

## 2012-11-26 ENCOUNTER — Encounter: Payer: Self-pay | Admitting: Internal Medicine

## 2012-12-05 ENCOUNTER — Encounter: Payer: Self-pay | Admitting: Cardiovascular Disease

## 2012-12-05 ENCOUNTER — Ambulatory Visit (INDEPENDENT_AMBULATORY_CARE_PROVIDER_SITE_OTHER): Payer: Medicaid Other | Admitting: Cardiovascular Disease

## 2012-12-05 VITALS — BP 132/86 | HR 67 | Wt 152.0 lb

## 2012-12-05 DIAGNOSIS — F17200 Nicotine dependence, unspecified, uncomplicated: Secondary | ICD-10-CM

## 2012-12-05 DIAGNOSIS — I8390 Asymptomatic varicose veins of unspecified lower extremity: Secondary | ICD-10-CM

## 2012-12-05 DIAGNOSIS — I658 Occlusion and stenosis of other precerebral arteries: Secondary | ICD-10-CM

## 2012-12-05 DIAGNOSIS — Z720 Tobacco use: Secondary | ICD-10-CM | POA: Insufficient documentation

## 2012-12-05 DIAGNOSIS — G459 Transient cerebral ischemic attack, unspecified: Secondary | ICD-10-CM

## 2012-12-05 DIAGNOSIS — I6523 Occlusion and stenosis of bilateral carotid arteries: Secondary | ICD-10-CM

## 2012-12-05 DIAGNOSIS — E785 Hyperlipidemia, unspecified: Secondary | ICD-10-CM

## 2012-12-05 NOTE — Assessment & Plan Note (Signed)
Counseled Less than 1ppd Continue patch and E-cig

## 2012-12-05 NOTE — Patient Instructions (Addendum)
Your physician wants you to follow-up in:   YEAR  WITH  DR NISHAN You will receive a reminder letter in the mail two months in advance. If you don't receive a letter, please call our office to schedule the follow-up appointment. Your physician recommends that you continue on your current medications as directed. Please refer to the Current Medication list given to you today. Your physician has requested that you have a carotid duplex. This test is an ultrasound of the carotid arteries in your neck. It looks at blood flow through these arteries that supply the brain with blood. Allow one hour for this exam. There are no restrictions or special instructions.  

## 2012-12-05 NOTE — Assessment & Plan Note (Signed)
F/U primary intolerant to ? statins

## 2012-12-05 NOTE — Progress Notes (Signed)
Patient ID: [name], female   DOB: 1956-02-29, 57 y.o.   MRN: [id number] 57 y.o. referred by [facility] Georgette [facility] for HTN, elevated lipids and TIA.  She continues to smoke Counseled for less than 10 minutes on cesssation and trying patches with E-cig.  10/12 had ? TIA with dysequilibrium  Reviewed w/u at [facility] Echo normal, LE duplex with varicose veins.  Carotid with 40-59% RICA.  D/C with ASA.  Intolerant to multiple chol pills pain and abdominal bloating.  No chest pain dyspnea , palpitations or syncope.  Walks daily.  No motor deficits.    ROS: Denies fever, malais, weight loss, blurry vision, decreased visual acuity, cough, sputum, SOB, hemoptysis, pleuritic pain, palpitaitons, heartburn, abdominal pain, melena, lower extremity edema, claudication, or rash.  All other systems reviewed and negative   General: Affect appropriate Healthy:  appears stated age HEENT: normal Neck supple with no adenopathy JVP normal no bruits no thyromegaly Lungs clear with no wheezing and good diaphragmatic motion Heart:  S1/S2 no murmur,rub, gallop or click PMI normal Abdomen: benighn, BS positve, no tenderness, no AAA no bruit.  No HSM or HJR Distal pulses intact with no bruits No edema Neuro non-focal Skin warm and dry No muscular weakness  Medications Current Outpatient Prescriptions  Medication Sig Dispense Refill  . ALPRAZolam (XANAX) 1 MG tablet Take 1 mg by mouth 2 (two) times daily as needed. anxiety      . aspirin 325 MG tablet Take 325 mg by mouth daily.      Marland Kitchen oxyCODONE (OXYCONTIN) 10 MG 12 hr tablet Take 10 mg by mouth as needed for pain.       No current facility-administered medications for this visit.    Allergies Ciprofloxacin hcl  Family History: Family History  Problem Relation Age of Onset  . Diabetes Paternal Grandfather   . Diabetes Paternal Grandmother   . Cancer Father     lung  . Diabetes Mother   . Heart disease Mother   . Cancer Mother     cervical  .  Diabetes Brother   . Stroke Brother   . Heart disease Sister   . Diabetes Sister   . Cancer Sister     cervical    Social History: History   Social History  . Marital Status: Legally Separated    Spouse Name: N/A    Number of Children: N/A  . Years of Education: N/A   Occupational History  . Not on file.   Social History Main Topics  . Smoking status: Current Every Day Smoker -- 0.50 packs/day for 35 years  . Smokeless tobacco: Never Used  . Alcohol Use: Yes     Comment: seldom  . Drug Use: No  . Sexually Active: No   Other Topics Concern  . Not on file   Social History Narrative  . No narrative on file    Electrocardiogram: 03/11/12  SR rate 59 normal  Assessment and Plan

## 2012-12-05 NOTE — Assessment & Plan Note (Signed)
Non painful Support hose and elevate legs as needed

## 2012-12-05 NOTE — Assessment & Plan Note (Signed)
F/U carotid duplex for moderate Right disease  ASA

## 2012-12-10 ENCOUNTER — Encounter: Payer: Medicaid Other | Admitting: Internal Medicine

## 2012-12-12 ENCOUNTER — Encounter (INDEPENDENT_AMBULATORY_CARE_PROVIDER_SITE_OTHER): Payer: Medicaid Other

## 2012-12-12 DIAGNOSIS — I6529 Occlusion and stenosis of unspecified carotid artery: Secondary | ICD-10-CM

## 2012-12-12 DIAGNOSIS — I6523 Occlusion and stenosis of bilateral carotid arteries: Secondary | ICD-10-CM

## 2012-12-18 ENCOUNTER — Telehealth: Payer: Self-pay | Admitting: Internal Medicine

## 2012-12-18 NOTE — Telephone Encounter (Signed)
PT AWARE OF CAROTID RESULTS ./CY 

## 2012-12-18 NOTE — Telephone Encounter (Signed)
New Problem:    Patient called in returning your call. Please call back. 

## 2013-01-04 ENCOUNTER — Emergency Department (HOSPITAL_COMMUNITY): Payer: Medicaid Other

## 2013-01-04 ENCOUNTER — Inpatient Hospital Stay (HOSPITAL_COMMUNITY)
Admission: EM | Admit: 2013-01-04 | Discharge: 2013-01-09 | DRG: 389 | Disposition: A | Discharge status: Home / Self Care | Payer: Medicaid Other | Attending: Surgery | Admitting: Surgery

## 2013-01-04 ENCOUNTER — Encounter (HOSPITAL_COMMUNITY): Payer: Self-pay | Admitting: Emergency Medicine

## 2013-01-04 DIAGNOSIS — K56609 Unspecified intestinal obstruction, unspecified as to partial versus complete obstruction: Secondary | ICD-10-CM

## 2013-01-04 DIAGNOSIS — N63 Unspecified lump in unspecified breast: Secondary | ICD-10-CM | POA: Diagnosis present

## 2013-01-04 DIAGNOSIS — E785 Hyperlipidemia, unspecified: Secondary | ICD-10-CM | POA: Diagnosis present

## 2013-01-04 DIAGNOSIS — F4323 Adjustment disorder with mixed anxiety and depressed mood: Secondary | ICD-10-CM | POA: Diagnosis present

## 2013-01-04 DIAGNOSIS — Z7982 Long term (current) use of aspirin: Secondary | ICD-10-CM

## 2013-01-04 DIAGNOSIS — G894 Chronic pain syndrome: Secondary | ICD-10-CM | POA: Diagnosis present

## 2013-01-04 DIAGNOSIS — F17200 Nicotine dependence, unspecified, uncomplicated: Secondary | ICD-10-CM | POA: Diagnosis present

## 2013-01-04 DIAGNOSIS — B1920 Unspecified viral hepatitis C without hepatic coma: Secondary | ICD-10-CM | POA: Diagnosis present

## 2013-01-04 DIAGNOSIS — E041 Nontoxic single thyroid nodule: Secondary | ICD-10-CM | POA: Diagnosis present

## 2013-01-04 DIAGNOSIS — N6320 Unspecified lump in the left breast, unspecified quadrant: Secondary | ICD-10-CM | POA: Diagnosis present

## 2013-01-04 DIAGNOSIS — Z8673 Personal history of transient ischemic attack (TIA), and cerebral infarction without residual deficits: Secondary | ICD-10-CM

## 2013-01-04 DIAGNOSIS — K5900 Constipation, unspecified: Secondary | ICD-10-CM | POA: Diagnosis present

## 2013-01-04 DIAGNOSIS — R17 Unspecified jaundice: Secondary | ICD-10-CM | POA: Diagnosis present

## 2013-01-04 DIAGNOSIS — K5669 Other intestinal obstruction: Principal | ICD-10-CM | POA: Diagnosis present

## 2013-01-04 DIAGNOSIS — B182 Chronic viral hepatitis C: Secondary | ICD-10-CM | POA: Diagnosis present

## 2013-01-04 DIAGNOSIS — R197 Diarrhea, unspecified: Secondary | ICD-10-CM | POA: Diagnosis present

## 2013-01-04 DIAGNOSIS — D72829 Elevated white blood cell count, unspecified: Secondary | ICD-10-CM | POA: Diagnosis present

## 2013-01-04 HISTORY — DX: Adjustment disorder with mixed anxiety and depressed mood: F43.23

## 2013-01-04 LAB — COMPREHENSIVE METABOLIC PANEL
BUN: 12 mg/dL (ref 6–23)
CO2: 22 mEq/L (ref 19–32)
Calcium: 9.7 mg/dL (ref 8.4–10.5)
Creatinine, Ser: 0.73 mg/dL (ref 0.50–1.10)
GFR calc Af Amer: >90 mL/min (ref >90)
GFR calc non Af Amer: >90 mL/min (ref >90)
Glucose, Bld: 97 mg/dL (ref 70–99)
Sodium: 137 mEq/L (ref 135–145)
Total Protein: 7 g/dL (ref 6.0–8.3)

## 2013-01-04 LAB — CBC WITH DIFFERENTIAL/PLATELET
Eosinophils Absolute: 0.1 10*3/uL (ref 0.0–0.7)
Eosinophils Relative: 1 % (ref 0–5)
HCT: 40.8 % (ref 36.0–46.0)
Lymphocytes Relative: 17 % (ref 12–46)
Lymphs Abs: 1.9 10*3/uL (ref 0.7–4.0)
MCH: 28.9 pg (ref 26.0–34.0)
MCV: 83.1 fL (ref 78.0–100.0)
Monocytes Absolute: 0.6 10*3/uL (ref 0.1–1.0)
Monocytes Relative: 6 % (ref 3–12)
Platelets: 240 10*3/uL (ref 150–400)
RBC: 4.91 MIL/uL (ref 3.87–5.11)

## 2013-01-04 LAB — URINALYSIS, MICROSCOPIC ONLY
Nitrite: NEGATIVE
Specific Gravity, Urine: 1.034 — ABNORMAL HIGH (ref 1.005–1.030)
Urobilinogen, UA: 1 mg/dL (ref 0.0–1.0)
pH: 7 (ref 5.0–8.0)

## 2013-01-04 LAB — LIPASE, BLOOD: Lipase: 11 U/L (ref 11–59)

## 2013-01-04 MED ORDER — HYDROMORPHONE HCL PF 1 MG/ML IJ SOLN
1.0000 mg | Freq: Once | INTRAMUSCULAR | Status: AC
  Administered 2013-01-04: 1 mg via INTRAVENOUS
  Filled 2013-01-04: qty 1

## 2013-01-04 MED ORDER — ONDANSETRON HCL 4 MG/2ML IJ SOLN
4.0000 mg | Freq: Once | INTRAMUSCULAR | Status: AC
  Administered 2013-01-04: 4 mg via INTRAVENOUS
  Filled 2013-01-04: qty 2

## 2013-01-04 MED ORDER — SODIUM CHLORIDE 0.9 % IV BOLUS (SEPSIS)
1000.0000 mL | Freq: Once | INTRAVENOUS | Status: AC
  Administered 2013-01-04: 1000 mL via INTRAVENOUS

## 2013-01-04 MED ORDER — FLEET ENEMA 7-19 GM/118ML RE ENEM
1.0000 | ENEMA | Freq: Once | RECTAL | Status: AC
  Administered 2013-01-04: 19:00:00 via RECTAL
  Filled 2013-01-04: qty 1

## 2013-01-04 MED ORDER — SODIUM CHLORIDE 0.9 % IV SOLN
INTRAVENOUS | Status: DC
  Administered 2013-01-04 – 2013-01-05 (×2): via INTRAVENOUS

## 2013-01-04 MED ORDER — IOHEXOL 300 MG/ML  SOLN
100.0000 mL | Freq: Once | INTRAMUSCULAR | Status: AC | PRN
  Administered 2013-01-04: 100 mL via INTRAVENOUS

## 2013-01-04 MED ORDER — IOHEXOL 300 MG/ML  SOLN
50.0000 mL | Freq: Once | INTRAMUSCULAR | Status: AC | PRN
  Administered 2013-01-04: 50 mL via ORAL

## 2013-01-04 NOTE — ED Notes (Signed)
Dr. Effie Shy states to stop fluid challenge because pt was vomiting. Recommend NPO until further notice from general surgery.

## 2013-01-04 NOTE — ED Provider Notes (Signed)
[name] is a 57 y.o. female who reports onset of abdominal pain, one week ago. She states she has not had a bowel movement in several weeks. She took a suppository last night and got a very tiny amount of stool out. She has nausea and vomiting. She denies fever, chills  Abdomen normal bowel sounds, soft, mild diffuse tenderness. There is no palpable mass or hepatosplenomegaly Neurologic grossly nonfocal. Psychiatric she is very anxious.  Assessment: Nonspecific abdominal pain. She will require assessment by CT imaging    Reevaluation: 21:30- Still has severe abdominal pain, N/V. Refuses NG at this time.   Consultation; General Surgery- 21:35- Dr. Derrell Lolling will see the patient  Medical screening examination/treatment/procedure(s) were conducted as a shared visit with non-physician practitioner(s) and myself.  I personally evaluated the patient during the encounter  Flint Melter, MD 01/05/13 [phone]

## 2013-01-04 NOTE — ED Notes (Signed)
Ct notified that pt is ready for CT 

## 2013-01-04 NOTE — ED Notes (Signed)
Order clarification re: catheter care order. Verbal order per Dr. Effie Shy for urinary catheter and nasogastric tube.

## 2013-01-04 NOTE — ED Notes (Signed)
Pt NPO - No fluid challenge.

## 2013-01-04 NOTE — ED Provider Notes (Signed)
History     CSN: [id number]  Arrival date & time 01/04/13  1336   First MD Initiated Contact with Patient 01/04/13 1510      Chief Complaint  Patient presents with  . Abdominal Pain    (Consider location/radiation/quality/duration/timing/severity/associated sxs/prior treatment) HPI Pt is a 57yo female hx of Hep C, TIA, and HLD c/o sharp, tearing sensation in epigastric region associated with nausea and vomiting x3 that started yesterday. Unknown if emesis was blooding.  Pt states she felt she was constipated and used 2 suppositories, states she did have a BM but unable to describe stool.  Unknown blood or mucous. Pt took 10mg  oxycodone she has for OA, states this did not help.  Pain waxes and wanes, 10/10 at worse, 0/10 at best.  Reports possible pancreatitis in past and was told she may need splenectomy 17yrs ago for Hep C, however informed 71mo ago by PCP her blood work indicated no Hep C. Reports taking daily aspirin 325mg . Denies fever, diarrhea, urinary symptoms, sick contacts, or abdominal surgery.    Past Medical History  Diagnosis Date  . Hepatitis C   . Jaundice   . Breast disorder     lump in left breast  . TIA (transient ischemic attack) OCT 2012  . Hyperlipidemia   . Varicose veins   . Brain aneurysm 2012  . Adjustment disorder with mixed anxiety and depressed mood 01/05/2013    History reviewed. No pertinent past surgical history.  Family History  Problem Relation Age of Onset  . Diabetes Paternal Grandfather   . Diabetes Paternal Grandmother   . Cancer Father     lung  . Diabetes Mother   . Heart disease Mother   . Cancer Mother     cervical  . Diabetes Brother   . Stroke Brother   . Heart disease Sister   . Diabetes Sister   . Cancer Sister     cervical    History  Substance Use Topics  . Smoking status: Current Every Day Smoker -- 0.50 packs/day for 35 years  . Smokeless tobacco: Never Used  . Alcohol Use: Yes     Comment: seldom    OB History    Grav Para Term Preterm Abortions TAB SAB Ect Mult Living   5 4  2 1 1    4       Review of Systems  Constitutional: Negative for fever and chills.  Respiratory: Negative for chest tightness and shortness of breath.   Cardiovascular: Negative for chest pain.  Gastrointestinal: Positive for nausea, vomiting, abdominal pain and abdominal distention. Negative for diarrhea.  Genitourinary: Negative for dysuria and flank pain.  Musculoskeletal: Negative for back pain.  Skin: Negative.   All other systems reviewed and are negative.    Allergies  Ciprofloxacin hcl  Home Medications   No current outpatient prescriptions on file.  BP 112/74  Pulse 63  Temp(Src) 98 F (36.7 C) (Oral)  Resp 18  Ht 5\' 6"  (1.676 m)  Wt 157 lb 3 oz (71.3 kg)  BMI 25.38 kg/m2  SpO2 98%  Physical Exam  Nursing note and vitals reviewed. Constitutional: She appears well-developed and well-nourished. No distress.  Pt laying on right side in exam bed, hold emesis bag and rubbing abdomen.  HENT:  Head: Normocephalic and atraumatic.  Eyes: Conjunctivae are normal. No scleral icterus.  Neck: Normal range of motion. Neck supple.  Cardiovascular: Normal rate, regular rhythm and normal heart sounds.   Pulmonary/Chest: Effort normal and breath  sounds normal. No respiratory distress. She has no wheezes. She has no rales. She exhibits no tenderness.  Abdominal: Soft. She exhibits distension ( per pt). She exhibits no pulsatile liver, no abdominal bruit, no ascites, no pulsatile midline mass and no mass. Bowel sounds are decreased. There is no splenomegaly or hepatomegaly. There is tenderness in the epigastric area, periumbilical area and left upper quadrant. There is no rigidity, no rebound, no guarding and no CVA tenderness. No hernia.  Musculoskeletal: Normal range of motion.  Neurological: She is alert.  Skin: Skin is warm and dry. She is not diaphoretic.    ED Course  Procedures (including critical care  time)  Labs Reviewed  CBC WITH DIFFERENTIAL - Abnormal; Notable for the following:    WBC 11.4 (*)    Neutro Abs 8.8 (*)    All other components within normal limits  URINALYSIS, MICROSCOPIC ONLY - Abnormal; Notable for the following:    Color, Urine AMBER (*)    Specific Gravity, Urine 1.034 (*)    Bilirubin Urine SMALL (*)    Ketones, ur >80 (*)    Leukocytes, UA TRACE (*)    Squamous Epithelial / LPF MANY (*)    All other components within normal limits  URINALYSIS, ROUTINE W REFLEX MICROSCOPIC - Abnormal; Notable for the following:    Color, Urine AMBER (*)    Hgb urine dipstick MODERATE (*)    Bilirubin Urine SMALL (*)    Ketones, ur >80 (*)    Protein, ur 30 (*)    All other components within normal limits  URINE MICROSCOPIC-ADD ON - Abnormal; Notable for the following:    Squamous Epithelial / LPF FEW (*)    Bacteria, UA FEW (*)    All other components within normal limits  BASIC METABOLIC PANEL - Abnormal; Notable for the following:    Glucose, Bld 157 (*)    All other components within normal limits  COMPREHENSIVE METABOLIC PANEL  LIPASE, BLOOD  LACTIC ACID, PLASMA  CBC   Ct Abdomen Pelvis W Contrast  01/04/2013  **ADDENDUM** CREATED: 01/04/2013 21:28:38  In the body of the report, a left breast mass was described but inadvertently omitted from the impression.  The patient had a left breast biopsy in 2009, but the previously described findings do not appear to correlate with today's CT finding.  Outpatient nonemergent bilateral diagnostic mammography and possibly left ultrasound are recommended for further evaluation, if the patient has not had intercurrent mammography elsewhere since 2009 (that is the most recent date for which we have record of mammography at this institution).  These findings and recommendations called to Dr. Effie Shy by Dr. Chilton Si on 01/04/2013 at 9:30 p.m.  **END ADDENDUM** SIGNED BY: Harrel Lemon, M.D.   01/04/2013  *RADIOLOGY REPORT*  Clinical  Data: Abdominal pain, reported history of hepatitis C  CT ABDOMEN AND PELVIS WITH CONTRAST  Technique:  Multidetector CT imaging of the abdomen and pelvis was performed following the standard protocol during bolus administration of intravenous contrast.  Contrast: OMNIPAQUE IOHEXOL 300 MG/ML  SOLN  Comparison: Abdominal radiograph same date  Findings: Minimal dependent bibasilar atelectasis noted.  In the left breast inferiorly, there is a lobulated 2.6 cm mass. Lateral to this, there is metallic artifact image 5 likely from previous ultrasound guided core biopsy in 2009.  Lung bases are clear.  Trace perihepatic ascites is present. There are numerous dilated loops of small bowel to the level of the ileum, with abrupt transition point image 71  and distal terminal ileal decompression.  Small bowel fecalization is present with bowel wall thickening involving the dilated small bowel loops, measuring 7 mm.  Surrounding free fluid is evident.  Colon is unremarkable.  The appendix is normal.  No free air is identified.  Liver, gallbladder, adrenal glands, kidneys, spleen, and pancreas are normal.  No lymphadenopathy.  Mild atheromatous aortic calcification without aneurysm.  No acute osseous abnormality. Uterus and ovaries are normal.  IMPRESSION: Small bowel obstruction, wall edema, and free fluid to the level of a transition point in the lower right pelvis.  This may be due to adhesion or occult mass. There is a risk of bowel ischemia.  Findings called to Dr. Effie Shy by Dr. Chilton Si on 01/04/2013 at 9:15 p.m.   Original Report Authenticated By: Christiana Pellant, M.D.    Dg Abd Acute W/chest  01/04/2013  *RADIOLOGY REPORT*  Clinical Data: Abdominal pain and distention.  Nausea.  ACUTE ABDOMEN SERIES (ABDOMEN 2 VIEW & CHEST 1 VIEW)  Comparison: 06/28/2004 CT scan  Findings: Interstitial accentuation in the lungs may be from tobacco use. Chest otherwise unremarkable.  Abnormal air-fluid levels present in the left  abdomen, within mildly dilated loops of small bowel.  Some of these air-fluid levels are at differing vertical heights in the same loop.  There may be some degree of small bowel mucosal irregularity on the supine view and this vicinity.  No free intraperitoneal gas observed.  IMPRESSION:  1.  Abnormal bowel gas pattern with air-fluid levels in the left abdomen within mildly dilated loops of small bowel.  Differing vertical heights of small bowel air fluid levels may reflect early obstruction.   Original Report Authenticated By: Gaylyn Rong, M.D.    3:10 PM Pt states medication has decreased pain and nausea.  Still waiting for plain films.  3:10 PM Checked on pt to see why she hadn't gone to CT.  Pt refuses until pain and nausea are under control.  Asking for an enema.  Will give dilaudid, enema and zofran.    Pt did feel better and ready to go to CT.  1. Small bowel obstruction   2. Left breast mass       MDM  Pt PMH Hep C c/o severe abd pain x1 day associated with nausea, vomiting, and constipation.  Started on fluids, zofran, and dilaudid.  PE: abd: soft, distended (per pt), diffuse ttp, > in epigastrium.   Concern for pancreatitis,  SBO, or peptic ulcer.  Will get labs and acute abdominal series.  Initial BP improved after 1L fluids.  Labs relatively normal. Acute abd: possible early obstruction.  Discussed pt with Dr. Effie Shy. Will order CT for better dx.  CT: SBO with wall edema, & free fluid to level of transition point in lower right pelvis. May be due to adhesin or occult mass. Risk of bowel ischemia.    Dr. Effie Shy consulted general surgery.  Pt will be admitted for SBO.  Pt refused NG tube but agreed after speaking to surgery. Dr. Derrell Lolling agreed to admit pt to med-surg floor. Pt made NPO.  NG tube and foley catheter will be placed.  Pt still on IV pain medication and prn zofran.   Discussed pt with attending during ED encounter.         Junius Finner, PA-C 01/05/13  1511

## 2013-01-04 NOTE — ED Notes (Signed)
Pt. Stated, I started having Abd. Pain last night with nausea.  Its right on my stomach.  Its tender to touch.  I had some ginger ale and then I vomited up.

## 2013-01-05 ENCOUNTER — Encounter (HOSPITAL_COMMUNITY): Payer: Self-pay | Admitting: *Deleted

## 2013-01-05 DIAGNOSIS — F4323 Adjustment disorder with mixed anxiety and depressed mood: Secondary | ICD-10-CM

## 2013-01-05 DIAGNOSIS — K56609 Unspecified intestinal obstruction, unspecified as to partial versus complete obstruction: Secondary | ICD-10-CM | POA: Diagnosis present

## 2013-01-05 DIAGNOSIS — R109 Unspecified abdominal pain: Secondary | ICD-10-CM

## 2013-01-05 DIAGNOSIS — G894 Chronic pain syndrome: Secondary | ICD-10-CM

## 2013-01-05 DIAGNOSIS — E041 Nontoxic single thyroid nodule: Secondary | ICD-10-CM | POA: Insufficient documentation

## 2013-01-05 HISTORY — DX: Adjustment disorder with mixed anxiety and depressed mood: F43.23

## 2013-01-05 LAB — URINALYSIS, ROUTINE W REFLEX MICROSCOPIC
Ketones, ur: >80 mg/dL — AB
Nitrite: NEGATIVE
Protein, ur: 30 mg/dL — AB
Specific Gravity, Urine: 1.03 (ref 1.005–1.030)
Urobilinogen, UA: 1 mg/dL (ref 0.0–1.0)

## 2013-01-05 LAB — CBC
Hemoglobin: 13 g/dL (ref 12.0–15.0)
MCH: 28.6 pg (ref 26.0–34.0)
MCV: 83.9 fL (ref 78.0–100.0)
Platelets: 215 10*3/uL (ref 150–400)
RBC: 4.54 MIL/uL (ref 3.87–5.11)
WBC: 9.4 10*3/uL (ref 4.0–10.5)

## 2013-01-05 LAB — URINE MICROSCOPIC-ADD ON

## 2013-01-05 LAB — BASIC METABOLIC PANEL
CO2: 24 mEq/L (ref 19–32)
Calcium: 8.5 mg/dL (ref 8.4–10.5)
Chloride: 108 mEq/L (ref 96–112)
Glucose, Bld: 157 mg/dL — ABNORMAL HIGH (ref 70–99)
Potassium: 3.7 mEq/L (ref 3.5–5.1)
Sodium: 141 mEq/L (ref 135–145)

## 2013-01-05 MED ORDER — HYDROMORPHONE HCL PF 1 MG/ML IJ SOLN
1.0000 mg | INTRAMUSCULAR | Status: DC | PRN
  Administered 2013-01-05 (×3): 1 mg via INTRAVENOUS
  Administered 2013-01-06: 2 mg via INTRAVENOUS
  Administered 2013-01-07 – 2013-01-08 (×4): 1 mg via INTRAVENOUS
  Filled 2013-01-05: qty 2
  Filled 2013-01-05 (×5): qty 1
  Filled 2013-01-05: qty 2

## 2013-01-05 MED ORDER — MENTHOL 3 MG MT LOZG
1.0000 | LOZENGE | OROMUCOSAL | Status: DC | PRN
  Filled 2013-01-05: qty 9

## 2013-01-05 MED ORDER — BISACODYL 10 MG RE SUPP: 10.0000 mg | Freq: Two times a day (BID) | RECTAL | Status: DC | PRN

## 2013-01-05 MED ORDER — PANTOPRAZOLE SODIUM 40 MG IV SOLR
40.0000 mg | Freq: Every day | INTRAVENOUS | Status: DC
  Administered 2013-01-05 – 2013-01-08 (×4): 40 mg via INTRAVENOUS
  Filled 2013-01-05 (×5): qty 40

## 2013-01-05 MED ORDER — MAGIC MOUTHWASH: 15.0000 mL | Freq: Four times a day (QID) | ORAL | Status: DC | PRN

## 2013-01-05 MED ORDER — DIPHENHYDRAMINE HCL 50 MG/ML IJ SOLN
12.5000 mg | Freq: Four times a day (QID) | INTRAMUSCULAR | Status: DC | PRN
  Administered 2013-01-09: 25 mg via INTRAVENOUS
  Filled 2013-01-05: qty 1

## 2013-01-05 MED ORDER — BIOTENE DRY MOUTH MT LIQD
15.0000 mL | Freq: Two times a day (BID) | OROMUCOSAL | Status: DC
  Administered 2013-01-05 – 2013-01-06 (×3): 15 mL via OROMUCOSAL

## 2013-01-05 MED ORDER — ALUM & MAG HYDROXIDE-SIMETH 200-200-20 MG/5ML PO SUSP: 30.0000 mL | Freq: Four times a day (QID) | ORAL | Status: DC | PRN

## 2013-01-05 MED ORDER — KETOROLAC TROMETHAMINE 15 MG/ML IJ SOLN
15.0000 mg | Freq: Four times a day (QID) | INTRAMUSCULAR | Status: AC
  Administered 2013-01-05 – 2013-01-06 (×4): 15 mg via INTRAVENOUS
  Filled 2013-01-05: qty 1

## 2013-01-05 MED ORDER — LIP MEDEX EX OINT
1.0000 "application " | TOPICAL_OINTMENT | Freq: Two times a day (BID) | CUTANEOUS | Status: DC
  Filled 2013-01-05: qty 7

## 2013-01-05 MED ORDER — ACETAMINOPHEN 650 MG RE SUPP: 650.0000 mg | Freq: Four times a day (QID) | RECTAL | Status: DC | PRN

## 2013-01-05 MED ORDER — LORAZEPAM 2 MG/ML IJ SOLN
0.5000 mg | Freq: Three times a day (TID) | INTRAMUSCULAR | Status: DC | PRN
  Administered 2013-01-05 (×2): 1 mg via INTRAVENOUS
  Filled 2013-01-05 (×2): qty 1

## 2013-01-05 MED ORDER — CHLORHEXIDINE GLUCONATE 0.12 % MT SOLN
15.0000 mL | Freq: Two times a day (BID) | OROMUCOSAL | Status: DC
  Administered 2013-01-05 – 2013-01-06 (×3): 15 mL via OROMUCOSAL
  Filled 2013-01-05 (×3): qty 15

## 2013-01-05 MED ORDER — BLISTEX EX OINT
TOPICAL_OINTMENT | Freq: Two times a day (BID) | CUTANEOUS | Status: DC
  Administered 2013-01-05 – 2013-01-09 (×9): via TOPICAL
  Filled 2013-01-05: qty 10

## 2013-01-05 MED ORDER — ONDANSETRON HCL 4 MG/2ML IJ SOLN
4.0000 mg | Freq: Once | INTRAMUSCULAR | Status: AC
  Administered 2013-01-05: 4 mg via INTRAVENOUS
  Filled 2013-01-05: qty 2

## 2013-01-05 MED ORDER — KETOROLAC TROMETHAMINE 15 MG/ML IJ SOLN
15.0000 mg | Freq: Four times a day (QID) | INTRAMUSCULAR | Status: DC | PRN
  Filled 2013-01-05: qty 1
  Filled 2013-01-05: qty 2
  Filled 2013-01-05: qty 1

## 2013-01-05 MED ORDER — METOPROLOL TARTRATE 1 MG/ML IV SOLN: 5.0000 mg | Freq: Four times a day (QID) | INTRAVENOUS | Status: DC | PRN

## 2013-01-05 MED ORDER — LACTATED RINGERS IV BOLUS (SEPSIS)
1000.0000 mL | Freq: Three times a day (TID) | INTRAVENOUS | Status: AC | PRN
  Administered 2013-01-05 – 2013-01-06 (×2): 1000 mL via INTRAVENOUS

## 2013-01-05 MED ORDER — ONDANSETRON HCL 4 MG/2ML IJ SOLN
4.0000 mg | Freq: Four times a day (QID) | INTRAMUSCULAR | Status: DC | PRN
  Administered 2013-01-05 – 2013-01-06 (×3): 4 mg via INTRAVENOUS
  Filled 2013-01-05 (×3): qty 2

## 2013-01-05 MED ORDER — HYDROMORPHONE HCL PF 1 MG/ML IJ SOLN
1.0000 mg | Freq: Once | INTRAMUSCULAR | Status: AC
  Administered 2013-01-05: 1 mg via INTRAVENOUS
  Filled 2013-01-05: qty 1

## 2013-01-05 MED ORDER — HYDROMORPHONE HCL PF 1 MG/ML IJ SOLN
0.5000 mg | INTRAMUSCULAR | Status: DC | PRN
  Filled 2013-01-05: qty 1

## 2013-01-05 MED ORDER — BISACODYL 10 MG RE SUPP
10.0000 mg | Freq: Every day | RECTAL | Status: DC
  Administered 2013-01-05 – 2013-01-09 (×3): 10 mg via RECTAL
  Filled 2013-01-05 (×4): qty 1

## 2013-01-05 MED ORDER — DEXTROSE-NACL 5-0.9 % IV SOLN
INTRAVENOUS | Status: DC
  Administered 2013-01-05 – 2013-01-08 (×6): via INTRAVENOUS

## 2013-01-05 MED ORDER — HYDROMORPHONE HCL PF 1 MG/ML IJ SOLN
1.0000 mg | INTRAMUSCULAR | Status: DC | PRN
  Administered 2013-01-05 (×2): 1 mg via INTRAVENOUS
  Filled 2013-01-05 (×2): qty 1

## 2013-01-05 MED ORDER — PHENOL 1.4 % MT LIQD
2.0000 | OROMUCOSAL | Status: DC | PRN
  Filled 2013-01-05: qty 177

## 2013-01-05 MED ORDER — KETOROLAC TROMETHAMINE 15 MG/ML IJ SOLN: 15.0000 mg | Freq: Four times a day (QID) | INTRAMUSCULAR | Status: DC | PRN

## 2013-01-05 MED ORDER — PROMETHAZINE HCL 25 MG/ML IJ SOLN: 12.5000 mg | Freq: Four times a day (QID) | INTRAMUSCULAR | Status: DC | PRN

## 2013-01-05 NOTE — Progress Notes (Signed)
[name] [id number] [birth year]  CARE TEAM:  PCP: [name], MD  Outpatient Care Team: Patient Care Team: [name], MD as PCP - General (Family Medicine)  Inpatient Treatment Team: Treatment Team: Attending Provider: Md Montez Morita, MD; Registered Nurse: Magnus Sinning, RN; Registered Nurse: Zoe Lan, RN; Registered Nurse: Laureen Ochs, RN; Respiratory Therapist: Antoine Poche, RRT   Subjective:  C/o pain Abd less distended w NGT Flatus w enema in ED  Objective:  Vital signs:  Filed Vitals:   01/05/13 0200 01/05/13 0300 01/05/13 0330 01/05/13 0427  BP: 120/76 121/77  116/67  Pulse: 63 64 66 63  Temp:   98.6 F (37 C) 98.1 F (36.7 C)  TempSrc:    Oral  Resp: 20  16 16   Height:    5\' 6"  (1.676 m)  Weight:    157 lb 3 oz (71.3 kg)  SpO2: 91% 92% 93% 97%    Last BM Date: 01/05/13 (after enema)  Intake/Output   Yesterday:  04/26 0701 - 04/27 0700 In: 30 [NG/GT:30] Out: 600 [Emesis/NG output:600] This shift:     Bowel function:  Flatus: n  BM: n  Drain: n/a  Physical Exam:  General: Pt awake/alert/oriented x4 in mild acute distress Eyes: PERRL, normal EOM.  Sclera clear.  No icterus Neuro: CN II-XII intact w/o focal sensory/motor deficits. Lymph: No head/neck/groin lymphadenopathy Psych:  No delerium/psychosis/paranoia.  Anxious/talkative/interrupting but consolable HENT: Normocephalic, Mucus membranes moist.  No thrush Neck: Supple, No tracheal deviation Chest: No chest wall pain w good excursion CV:  Pulses intact.  Regular rhythm MS: Normal AROM mjr joints.  No obvious deformity Abdomen: Soft.  Nondistended.  Mildly tender diffusely.  No evidence of peritonitis to cough.  Can sit up but afraid to move.  No incarcerated hernias. Ext:  SCDs BLE.  No mjr edema.  No cyanosis Skin: No petechiae / purpura   Problem List:   Principal Problem:   SBO (small bowel obstruction) Active Problems:   Hepatitis C   Mass  of breast, left   Adjustment disorder with mixed anxiety and depressed mood   Chronic pain syndrome   Assessment  [name]  57 y.o. female       Stabilizing but guarded  Plan:  -NGT -sips -IVF -improve pain control but follow closely -anxiolysis -f/u Xray in AM  -challenge to follow w chronic pain & anxiety.  No fever & WBC better, abd softer & no definite peritonits hopeful signs, but SB fecalization on CT, no prior abd surgery & c/o pain concerning.  If not better or worse - exlap 1-2days.  She wants to avoid surgery if possible.  I explained this can be life-threatening & surgery can be avoided 75% of time if pt improves.  She expressed understanding.  Time =34min.   -VTE prophylaxis- SCDs, etc -mobilize as tolerated to help recovery  Ardeth Sportsman, M.D., F.A.C.S. Gastrointestinal and Minimally Invasive Surgery [facility] [address], [address] [place], [place] [address] [phone] Main / Paging   01/05/2013   Results:   Labs: Results for orders placed during the hospital encounter of 01/04/13 (from the past 48 hour(s))  CBC WITH DIFFERENTIAL     Status: Abnormal   Collection Time    01/04/13  1:55 PM      Result Value Range   WBC 11.4 (*) 4.0 - 10.5 K/uL   RBC 4.91  3.87 - 5.11 MIL/uL   Hemoglobin 14.2  12.0 -  15.0 g/dL   HCT 16.1  09.6 - 04.5 %   MCV 83.1  78.0 - 100.0 fL   MCH 28.9  26.0 - 34.0 pg   MCHC 34.8  30.0 - 36.0 g/dL   RDW 40.9  81.1 - 91.4 %   Platelets 240  150 - 400 K/uL   Neutrophils Relative 77  43 - 77 %   Neutro Abs 8.8 (*) 1.7 - 7.7 K/uL   Lymphocytes Relative 17  12 - 46 %   Lymphs Abs 1.9  0.7 - 4.0 K/uL   Monocytes Relative 6  3 - 12 %   Monocytes Absolute 0.6  0.1 - 1.0 K/uL   Eosinophils Relative 1  0 - 5 %   Eosinophils Absolute 0.1  0.0 - 0.7 K/uL   Basophils Relative 0  0 - 1 %   Basophils Absolute 0.0  0.0 - 0.1 K/uL  COMPREHENSIVE METABOLIC PANEL     Status: None   Collection Time     01/04/13  1:55 PM      Result Value Range   Sodium 137  135 - 145 mEq/L   Potassium 4.2  3.5 - 5.1 mEq/L   Chloride 102  96 - 112 mEq/L   CO2 22  19 - 32 mEq/L   Glucose, Bld 97  70 - 99 mg/dL   BUN 12  6 - 23 mg/dL   Creatinine, Ser 7.82  0.50 - 1.10 mg/dL   Calcium 9.7  8.4 - 95.6 mg/dL   Total Protein 7.0  6.0 - 8.3 g/dL   Albumin 3.9  3.5 - 5.2 g/dL   AST 19  0 - 37 U/L   ALT 16  0 - 35 U/L   Alkaline Phosphatase 69  39 - 117 U/L   Total Bilirubin 0.6  0.3 - 1.2 mg/dL   GFR calc non Af Amer >90  >90 mL/min   GFR calc Af Amer >90  >90 mL/min   Comment:            The eGFR has been calculated     using the CKD EPI equation.     This calculation has not been     validated in all clinical     situations.     eGFR's persistently     <90 mL/min signify     possible Chronic Kidney Disease.  LIPASE, BLOOD     Status: None   Collection Time    01/04/13  1:55 PM      Result Value Range   Lipase 11  11 - 59 U/L  URINALYSIS, MICROSCOPIC ONLY     Status: Abnormal   Collection Time    01/04/13  3:18 PM      Result Value Range   Color, Urine AMBER (*) YELLOW   Comment: BIOCHEMICALS MAY BE AFFECTED BY COLOR   APPearance CLEAR  CLEAR   Specific Gravity, Urine 1.034 (*) 1.005 - 1.030   pH 7.0  5.0 - 8.0   Glucose, UA NEGATIVE  NEGATIVE mg/dL   Hgb urine dipstick NEGATIVE  NEGATIVE   Bilirubin Urine SMALL (*) NEGATIVE   Ketones, ur >80 (*) NEGATIVE mg/dL   Protein, ur NEGATIVE  NEGATIVE mg/dL   Urobilinogen, UA 1.0  0.0 - 1.0 mg/dL   Nitrite NEGATIVE  NEGATIVE   Leukocytes, UA TRACE (*) NEGATIVE   WBC, UA 0-2  <3 WBC/hpf   Bacteria, UA RARE  RARE   Squamous Epithelial / LPF MANY (*)  RARE   Urine-Other MUCOUS PRESENT     Comment: AMORPHOUS URATES/PHOSPHATES  LACTIC ACID, PLASMA     Status: None   Collection Time    01/04/13 10:12 PM      Result Value Range   Lactic Acid, Venous 0.8  0.5 - 2.2 mmol/L  URINALYSIS, ROUTINE W REFLEX MICROSCOPIC     Status: Abnormal    Collection Time    01/05/13  3:45 AM      Result Value Range   Color, Urine AMBER (*) YELLOW   Comment: BIOCHEMICALS MAY BE AFFECTED BY COLOR   APPearance CLEAR  CLEAR   Specific Gravity, Urine 1.030  1.005 - 1.030   pH 5.5  5.0 - 8.0   Glucose, UA NEGATIVE  NEGATIVE mg/dL   Hgb urine dipstick MODERATE (*) NEGATIVE   Bilirubin Urine SMALL (*) NEGATIVE   Ketones, ur >80 (*) NEGATIVE mg/dL   Protein, ur 30 (*) NEGATIVE mg/dL   Urobilinogen, UA 1.0  0.0 - 1.0 mg/dL   Nitrite NEGATIVE  NEGATIVE   Leukocytes, UA NEGATIVE  NEGATIVE  URINE MICROSCOPIC-ADD ON     Status: Abnormal   Collection Time    01/05/13  3:45 AM      Result Value Range   Squamous Epithelial / LPF FEW (*) RARE   WBC, UA 0-2  <3 WBC/hpf   RBC / HPF 7-10  <3 RBC/hpf   Bacteria, UA FEW (*) RARE   Urine-Other MICROSCOPIC EXAM PERFORMED ON UNCONCENTRATED URINE     Comment: LESS THAN 10 mL OF URINE SUBMITTED  BASIC METABOLIC PANEL     Status: Abnormal   Collection Time    01/05/13  6:50 AM      Result Value Range   Sodium 141  135 - 145 mEq/L   Potassium 3.7  3.5 - 5.1 mEq/L   Chloride 108  96 - 112 mEq/L   CO2 24  19 - 32 mEq/L   Glucose, Bld 157 (*) 70 - 99 mg/dL   BUN 9  6 - 23 mg/dL   Creatinine, Ser 2.72  0.50 - 1.10 mg/dL   Calcium 8.5  8.4 - 53.6 mg/dL   GFR calc non Af Amer >90  >90 mL/min   GFR calc Af Amer >90  >90 mL/min   Comment:            The eGFR has been calculated     using the CKD EPI equation.     This calculation has not been     validated in all clinical     situations.     eGFR's persistently     <90 mL/min signify     possible Chronic Kidney Disease.  CBC     Status: None   Collection Time    01/05/13  6:50 AM      Result Value Range   WBC 9.4  4.0 - 10.5 K/uL   RBC 4.54  3.87 - 5.11 MIL/uL   Hemoglobin 13.0  12.0 - 15.0 g/dL   HCT 64.4  03.4 - 74.2 %   MCV 83.9  78.0 - 100.0 fL   MCH 28.6  26.0 - 34.0 pg   MCHC 34.1  30.0 - 36.0 g/dL   RDW 59.5  63.8 - 75.6 %   Platelets  215  150 - 400 K/uL    Imaging / Studies: Ct Abdomen Pelvis W Contrast  01/04/2013  **ADDENDUM** CREATED: 01/04/2013 21:28:38  In the body of the report, a left  breast mass was described but inadvertently omitted from the impression.  The patient had a left breast biopsy in 2009, but the previously described findings do not appear to correlate with today's CT finding.  Outpatient nonemergent bilateral diagnostic mammography and possibly left ultrasound are recommended for further evaluation, if the patient has not had intercurrent mammography elsewhere since 2009 (that is the most recent date for which we have record of mammography at this institution).  These findings and recommendations called to Dr. Effie Shy by Dr. Chilton Si on 01/04/2013 at 9:30 p.m.  **END ADDENDUM** SIGNED BY: Harrel Lemon, M.D.   01/04/2013  *RADIOLOGY REPORT*  Clinical Data: Abdominal pain, reported history of hepatitis C  CT ABDOMEN AND PELVIS WITH CONTRAST  Technique:  Multidetector CT imaging of the abdomen and pelvis was performed following the standard protocol during bolus administration of intravenous contrast.  Contrast: OMNIPAQUE IOHEXOL 300 MG/ML  SOLN  Comparison: Abdominal radiograph same date  Findings: Minimal dependent bibasilar atelectasis noted.  In the left breast inferiorly, there is a lobulated 2.6 cm mass. Lateral to this, there is metallic artifact image 5 likely from previous ultrasound guided core biopsy in 2009.  Lung bases are clear.  Trace perihepatic ascites is present. There are numerous dilated loops of small bowel to the level of the ileum, with abrupt transition point image 71 and distal terminal ileal decompression.  Small bowel fecalization is present with bowel wall thickening involving the dilated small bowel loops, measuring 7 mm.  Surrounding free fluid is evident.  Colon is unremarkable.  The appendix is normal.  No free air is identified.  Liver, gallbladder, adrenal glands, kidneys, spleen,  and pancreas are normal.  No lymphadenopathy.  Mild atheromatous aortic calcification without aneurysm.  No acute osseous abnormality. Uterus and ovaries are normal.  IMPRESSION: Small bowel obstruction, wall edema, and free fluid to the level of a transition point in the lower right pelvis.  This may be due to adhesion or occult mass. There is a risk of bowel ischemia.  Findings called to Dr. Effie Shy by Dr. Chilton Si on 01/04/2013 at 9:15 p.m.   Original Report Authenticated By: Christiana Pellant, M.D.    Dg Abd Acute W/chest  01/04/2013  *RADIOLOGY REPORT*  Clinical Data: Abdominal pain and distention.  Nausea.  ACUTE ABDOMEN SERIES (ABDOMEN 2 VIEW & CHEST 1 VIEW)  Comparison: 06/28/2004 CT scan  Findings: Interstitial accentuation in the lungs may be from tobacco use. Chest otherwise unremarkable.  Abnormal air-fluid levels present in the left abdomen, within mildly dilated loops of small bowel.  Some of these air-fluid levels are at differing vertical heights in the same loop.  There may be some degree of small bowel mucosal irregularity on the supine view and this vicinity.  No free intraperitoneal gas observed.  IMPRESSION:  1.  Abnormal bowel gas pattern with air-fluid levels in the left abdomen within mildly dilated loops of small bowel.  Differing vertical heights of small bowel air fluid levels may reflect early obstruction.   Original Report Authenticated By: Gaylyn Rong, M.D.     Medications / Allergies: per chart  Antibiotics: Anti-infectives   None

## 2013-01-05 NOTE — Progress Notes (Signed)
Urine output was 150cc for 8 hours. LR bolus given per PRN order. Will continue to monitor.

## 2013-01-05 NOTE — ED Notes (Signed)
Pt reports pain.Dr. Effie Shy updated re: Dr. Derrell Lolling in surgery. Verbal order per Dr. Effie Shy for Dilaudid 1 mg IV x one dose and Zofran 4mg  IV x one dose.

## 2013-01-05 NOTE — H&P (Addendum)
[name] is an 57 y.o. female.   Chief Complaint: Abdominal pain HPI: the patient is a 57 year old female with a 24 history of abdominal pain, with nausea and emesis upon arrival to the ED. The patient states that her pain has been generalized over her abdomen. She felt that she was constipated and take the suppositories the night prior to admission on the morning of admission.  The patient was given a enema in the ER with minimal output. The patient also is she takes a large amount of psyllium fiber. The patient underwent CT scan of the abdomen and pelvis which revealed dilated loops of bowel, fluid-filled. There also seemed to be a transition point in the distal ileum.  Past Medical History  Diagnosis Date  . Hepatitis C   . Jaundice   . Mental disorder     anxiety and depression  . Breast disorder     lump in left breast  . TIA (transient ischemic attack) OCT 2012  . Hyperlipidemia   . Varicose veins   . Brain aneurysm 2012    History reviewed. No pertinent past surgical history.  Family History  Problem Relation Age of Onset  . Diabetes Paternal Grandfather   . Diabetes Paternal Grandmother   . Cancer Father     lung  . Diabetes Mother   . Heart disease Mother   . Cancer Mother     cervical  . Diabetes Brother   . Stroke Brother   . Heart disease Sister   . Diabetes Sister   . Cancer Sister     cervical   Social History:  reports that she has been smoking.  She has never used smokeless tobacco. She reports that  drinks alcohol. She reports that she does not use illicit drugs.  Allergies:  Allergies  Allergen Reactions  . Ciprofloxacin Hcl Palpitations    Medications Prior to Admission  Medication Sig Dispense Refill  . ALPRAZolam (XANAX) 1 MG tablet Take 1 mg by mouth 2 (two) times daily as needed for anxiety.       Marland Kitchen aspirin 325 MG tablet Take 325 mg by mouth daily.      [place] Lanius CAPS Take 1 capsule by mouth daily.      Marland Kitchen oxyCODONE (OXYCONTIN) 10 MG 12 hr  tablet Take 10 mg by mouth 3 (three) times daily as needed for pain.       Marland Kitchen Psyllium Husk POWD Take 5 mLs by mouth daily.        Results for orders placed during the hospital encounter of 01/04/13 (from the past 48 hour(s))  CBC WITH DIFFERENTIAL     Status: Abnormal   Collection Time    01/04/13  1:55 PM      Result Value Range   WBC 11.4 (*) 4.0 - 10.5 K/uL   RBC 4.91  3.87 - 5.11 MIL/uL   Hemoglobin 14.2  12.0 - 15.0 g/dL   HCT 40.9  81.1 - 91.4 %   MCV 83.1  78.0 - 100.0 fL   MCH 28.9  26.0 - 34.0 pg   MCHC 34.8  30.0 - 36.0 g/dL   RDW 78.2  95.6 - 21.3 %   Platelets 240  150 - 400 K/uL   Neutrophils Relative 77  43 - 77 %   Neutro Abs 8.8 (*) 1.7 - 7.7 K/uL   Lymphocytes Relative 17  12 - 46 %   Lymphs Abs 1.9  0.7 - 4.0 K/uL   Monocytes  Relative 6  3 - 12 %   Monocytes Absolute 0.6  0.1 - 1.0 K/uL   Eosinophils Relative 1  0 - 5 %   Eosinophils Absolute 0.1  0.0 - 0.7 K/uL   Basophils Relative 0  0 - 1 %   Basophils Absolute 0.0  0.0 - 0.1 K/uL  COMPREHENSIVE METABOLIC PANEL     Status: None   Collection Time    01/04/13  1:55 PM      Result Value Range   Sodium 137  135 - 145 mEq/L   Potassium 4.2  3.5 - 5.1 mEq/L   Chloride 102  96 - 112 mEq/L   CO2 22  19 - 32 mEq/L   Glucose, Bld 97  70 - 99 mg/dL   BUN 12  6 - 23 mg/dL   Creatinine, Ser 1.61  0.50 - 1.10 mg/dL   Calcium 9.7  8.4 - 09.6 mg/dL   Total Protein 7.0  6.0 - 8.3 g/dL   Albumin 3.9  3.5 - 5.2 g/dL   AST 19  0 - 37 U/L   ALT 16  0 - 35 U/L   Alkaline Phosphatase 69  39 - 117 U/L   Total Bilirubin 0.6  0.3 - 1.2 mg/dL   GFR calc non Af Amer >90  >90 mL/min   GFR calc Af Amer >90  >90 mL/min   Comment:            The eGFR has been calculated     using the CKD EPI equation.     This calculation has not been     validated in all clinical     situations.     eGFR's persistently     <90 mL/min signify     possible Chronic Kidney Disease.  LIPASE, BLOOD     Status: None   Collection Time     01/04/13  1:55 PM      Result Value Range   Lipase 11  11 - 59 U/L  URINALYSIS, MICROSCOPIC ONLY     Status: Abnormal   Collection Time    01/04/13  3:18 PM      Result Value Range   Color, Urine AMBER (*) YELLOW   Comment: BIOCHEMICALS MAY BE AFFECTED BY COLOR   APPearance CLEAR  CLEAR   Specific Gravity, Urine 1.034 (*) 1.005 - 1.030   pH 7.0  5.0 - 8.0   Glucose, UA NEGATIVE  NEGATIVE mg/dL   Hgb urine dipstick NEGATIVE  NEGATIVE   Bilirubin Urine SMALL (*) NEGATIVE   Ketones, ur >80 (*) NEGATIVE mg/dL   Protein, ur NEGATIVE  NEGATIVE mg/dL   Urobilinogen, UA 1.0  0.0 - 1.0 mg/dL   Nitrite NEGATIVE  NEGATIVE   Leukocytes, UA TRACE (*) NEGATIVE   WBC, UA 0-2  <3 WBC/hpf   Bacteria, UA RARE  RARE   Squamous Epithelial / LPF MANY (*) RARE   Urine-Other MUCOUS PRESENT     Comment: AMORPHOUS URATES/PHOSPHATES  LACTIC ACID, PLASMA     Status: None   Collection Time    01/04/13 10:12 PM      Result Value Range   Lactic Acid, Venous 0.8  0.5 - 2.2 mmol/L  URINALYSIS, ROUTINE W REFLEX MICROSCOPIC     Status: Abnormal   Collection Time    01/05/13  3:45 AM      Result Value Range   Color, Urine AMBER (*) YELLOW   Comment: BIOCHEMICALS MAY BE AFFECTED BY COLOR  APPearance CLEAR  CLEAR   Specific Gravity, Urine 1.030  1.005 - 1.030   pH 5.5  5.0 - 8.0   Glucose, UA NEGATIVE  NEGATIVE mg/dL   Hgb urine dipstick MODERATE (*) NEGATIVE   Bilirubin Urine SMALL (*) NEGATIVE   Ketones, ur >80 (*) NEGATIVE mg/dL   Protein, ur 30 (*) NEGATIVE mg/dL   Urobilinogen, UA 1.0  0.0 - 1.0 mg/dL   Nitrite NEGATIVE  NEGATIVE   Leukocytes, UA NEGATIVE  NEGATIVE  URINE MICROSCOPIC-ADD ON     Status: Abnormal   Collection Time    01/05/13  3:45 AM      Result Value Range   Squamous Epithelial / LPF FEW (*) RARE   WBC, UA 0-2  <3 WBC/hpf   RBC / HPF 7-10  <3 RBC/hpf   Bacteria, UA FEW (*) RARE   Urine-Other MICROSCOPIC EXAM PERFORMED ON UNCONCENTRATED URINE     Comment: LESS THAN 10 mL OF  URINE SUBMITTED   Ct Abdomen Pelvis W Contrast  01/04/2013  **ADDENDUM** CREATED: 01/04/2013 21:28:38  In the body of the report, a left breast mass was described but inadvertently omitted from the impression.  The patient had a left breast biopsy in 2009, but the previously described findings do not appear to correlate with today's CT finding.  Outpatient nonemergent bilateral diagnostic mammography and possibly left ultrasound are recommended for further evaluation, if the patient has not had intercurrent mammography elsewhere since 2009 (that is the most recent date for which we have record of mammography at this institution).  These findings and recommendations called to Dr. Effie Shy by Dr. Chilton Si on 01/04/2013 at 9:30 p.m.  **END ADDENDUM** SIGNED BY: Harrel Lemon, M.D.   01/04/2013  *RADIOLOGY REPORT*  Clinical Data: Abdominal pain, reported history of hepatitis C  CT ABDOMEN AND PELVIS WITH CONTRAST  Technique:  Multidetector CT imaging of the abdomen and pelvis was performed following the standard protocol during bolus administration of intravenous contrast.  Contrast: OMNIPAQUE IOHEXOL 300 MG/ML  SOLN  Comparison: Abdominal radiograph same date  Findings: Minimal dependent bibasilar atelectasis noted.  In the left breast inferiorly, there is a lobulated 2.6 cm mass. Lateral to this, there is metallic artifact image 5 likely from previous ultrasound guided core biopsy in 2009.  Lung bases are clear.  Trace perihepatic ascites is present. There are numerous dilated loops of small bowel to the level of the ileum, with abrupt transition point image 71 and distal terminal ileal decompression.  Small bowel fecalization is present with bowel wall thickening involving the dilated small bowel loops, measuring 7 mm.  Surrounding free fluid is evident.  Colon is unremarkable.  The appendix is normal.  No free air is identified.  Liver, gallbladder, adrenal glands, kidneys, spleen, and pancreas are normal.   No lymphadenopathy.  Mild atheromatous aortic calcification without aneurysm.  No acute osseous abnormality. Uterus and ovaries are normal.  IMPRESSION: Small bowel obstruction, wall edema, and free fluid to the level of a transition point in the lower right pelvis.  This may be due to adhesion or occult mass. There is a risk of bowel ischemia.  Findings called to Dr. Effie Shy by Dr. Chilton Si on 01/04/2013 at 9:15 p.m.   Original Report Authenticated By: Christiana Pellant, M.D.    Dg Abd Acute W/chest  01/04/2013  *RADIOLOGY REPORT*  Clinical Data: Abdominal pain and distention.  Nausea.  ACUTE ABDOMEN SERIES (ABDOMEN 2 VIEW & CHEST 1 VIEW)  Comparison: 06/28/2004 CT scan  Findings: Interstitial accentuation in the lungs may be from tobacco use. Chest otherwise unremarkable.  Abnormal air-fluid levels present in the left abdomen, within mildly dilated loops of small bowel.  Some of these air-fluid levels are at differing vertical heights in the same loop.  There may be some degree of small bowel mucosal irregularity on the supine view and this vicinity.  No free intraperitoneal gas observed.  IMPRESSION:  1.  Abnormal bowel gas pattern with air-fluid levels in the left abdomen within mildly dilated loops of small bowel.  Differing vertical heights of small bowel air fluid levels may reflect early obstruction.   Original Report Authenticated By: Gaylyn Rong, M.D.     Review of Systems  Constitutional: Negative.   HENT: Negative.   Eyes: Negative.   Respiratory: Negative.   Cardiovascular: Negative.   Gastrointestinal: Positive for nausea, vomiting and abdominal pain. Negative for diarrhea and constipation.  Genitourinary: Negative.   Musculoskeletal: Negative.   Skin: Negative.   Neurological: Negative.     Blood pressure 116/67, pulse 63, temperature 98.1 F (36.7 C), temperature source Oral, resp. rate 16, height 5\' 6"  (1.676 m), weight 157 lb 3 oz (71.3 kg), SpO2 97.00%. Physical Exam   Constitutional: She is oriented to person, place, and time. She appears well-developed and well-nourished.  HENT:  Head: Normocephalic and atraumatic.  Eyes: Conjunctivae and EOM are normal. Pupils are equal, round, and reactive to light.  Neck: Neck supple.  Cardiovascular: Normal rate, regular rhythm and normal heart sounds.   Respiratory: Effort normal and breath sounds normal.  GI: Soft. She exhibits distension. She exhibits no mass. Bowel sounds are increased. There is tenderness (x4Q's). There is no rebound and no guarding.  Musculoskeletal: Normal range of motion.  Neurological: She is alert and oriented to person, place, and time.     Assessment/Plan 89 her old female with a small bowel suction, possible bezoar.  With the patient admitted to the MedSurg floor. Patient will require an NG tube to be placed. We'll continue with serial abdominal exams. I discussed with the patient should her symptoms in bowel function not resolved the patient may require a laparotomy to further assess her bowel function. We'll keep patient n.p.o. With IV fluids.  Of note the patient also has a left breast mass for which she says is being followed by the breast center. The patient has had this previously worked up and is due for a mammogram.  Marigene Ehlers., Jed Limerick 01/05/2013, 4:32 AM

## 2013-01-05 NOTE — Progress Notes (Signed)
Utilization review completed.  

## 2013-01-06 ENCOUNTER — Inpatient Hospital Stay (HOSPITAL_COMMUNITY): Payer: Medicaid Other

## 2013-01-06 DIAGNOSIS — K5900 Constipation, unspecified: Secondary | ICD-10-CM

## 2013-01-06 MED ORDER — SORBITOL 70 % SOLN
960.0000 mL | TOPICAL_OIL | Freq: Once | ORAL | Status: AC
  Administered 2013-01-06: 960 mL via RECTAL
  Filled 2013-01-06 (×2): qty 240

## 2013-01-06 MED ORDER — ENOXAPARIN SODIUM 40 MG/0.4ML ~~LOC~~ SOLN
40.0000 mg | SUBCUTANEOUS | Status: DC
  Administered 2013-01-06: 40 mg via SUBCUTANEOUS
  Filled 2013-01-06 (×4): qty 0.4

## 2013-01-06 MED ORDER — ALPRAZOLAM 0.5 MG PO TABS
1.0000 mg | ORAL_TABLET | Freq: Two times a day (BID) | ORAL | Status: DC | PRN
  Administered 2013-01-06 – 2013-01-08 (×6): 1 mg via ORAL
  Filled 2013-01-06: qty 2
  Filled 2013-01-06: qty 1
  Filled 2013-01-06 (×2): qty 2
  Filled 2013-01-06: qty 1
  Filled 2013-01-06 (×2): qty 2

## 2013-01-06 NOTE — Progress Notes (Signed)
I have seen and examined the patient and agree with the assessment and plans. Clamping NG  Mekel Haverstock A. Magnus Ivan  MD, FACS

## 2013-01-06 NOTE — Progress Notes (Signed)
NG tube d/c. Was clamped for 4 hours residual checked and 25cc returned

## 2013-01-06 NOTE — Progress Notes (Signed)
Nutrition Brief Note  Patient identified on the Malnutrition Screening Tool (MST) Report  Body mass index is 25.38 kg/(m^2). Patient meets criteria for overweight based on current BMI.   Current diet order is clear liquid.  Labs and medications reviewed.   Pt with SBO. Status improved since yesterday- clamped NGT, reported appetite.  Pt has lost 5 lbs over the past 1 year (3% UBW) which is not clinically significant.  No nutrition interventions warranted at this time. If nutrition issues arise, please consult RD.   Loyce Dys, MS RD LDN Clinical Inpatient Dietitian Pager: [phone] Weekend/After hours pager: [phone]

## 2013-01-06 NOTE — Progress Notes (Signed)
Subjective: Pt much improved today, pain is nearly resolved, no N/V.  Pt passing flatus, small BM today.  Ambulating to BR.  Wants to get NG out and start eating.  Pt notes she has trouble with chronic constipation since her pain mgt doctor changed her dosage to 10mg  from 5mg  TID.  Objective: Vital signs in last 24 hours: Temp:  [98 F (36.7 C)-98.7 F (37.1 C)] 98.6 F (37 C) (04/28 0513) Pulse Rate:  [61-68] 68 (04/28 0513) Resp:  [18-20] 18 (04/28 0513) BP: (112-134)/(66-75) 133/70 mmHg (04/28 0513) SpO2:  [95 %-98 %] 96 % (04/28 0513) Last BM Date: 01/05/13  Intake/Output from previous day: 04/27 0701 - 04/28 0700 In: 2427 [P.O.:360; I.V.:2067] Out: 500 [Urine:400; Emesis/NG output:100] Intake/Output this shift:    PE: Gen:  Alert, NAD, pleasant Abd: Soft, NT/ND, +BS, no HSM, no abdominal scars noted   Lab Results:   Recent Labs  01/04/13 1355 01/05/13 0650  WBC 11.4* 9.4  HGB 14.2 13.0  HCT 40.8 38.1  PLT 240 215   BMET  Recent Labs  01/04/13 1355 01/05/13 0650  NA 137 141  K 4.2 3.7  CL 102 108  CO2 22 24  GLUCOSE 97 157*  BUN 12 9  CREATININE 0.73 0.68  CALCIUM 9.7 8.5   PT/INR No results found for this basename: LABPROT, INR,  in the last 72 hours CMP     Component Value Date/Time   NA 141 01/05/2013 0650   K 3.7 01/05/2013 0650   CL 108 01/05/2013 0650   CO2 24 01/05/2013 0650   GLUCOSE 157* 01/05/2013 0650   BUN 9 01/05/2013 0650   CREATININE 0.68 01/05/2013 0650   CALCIUM 8.5 01/05/2013 0650   PROT 7.0 01/04/2013 1355   ALBUMIN 3.9 01/04/2013 1355   AST 19 01/04/2013 1355   ALT 16 01/04/2013 1355   ALKPHOS 69 01/04/2013 1355   BILITOT 0.6 01/04/2013 1355   GFRNONAA >90 01/05/2013 0650   GFRAA >90 01/05/2013 0650   Lipase     Component Value Date/Time   LIPASE 11 01/04/2013 1355       Studies/Results: Dg Abd 1 View  01/06/2013  *RADIOLOGY REPORT*  Clinical Data: Low abdominal pain.  Small bowel obstruction.  ABDOMEN - 1 VIEW   Comparison: CT abdomen pelvis and acute abdominal series 01/04/2013.  Findings: There is retained oral contrast in the distal transverse and descending colon.  Mild gaseous distention of small bowel loops in the central abdomen, measuring up to 3.3 cm.  Gas is seen in nondistended colon and in the rectum.  IMPRESSION: Resolving small bowel obstruction.   Original Report Authenticated By: Leanna Battles, M.D.    Ct Abdomen Pelvis W Contrast  01/04/2013  **ADDENDUM** CREATED: 01/04/2013 21:28:38  In the body of the report, a left breast mass was described but inadvertently omitted from the impression.  The patient had a left breast biopsy in 2009, but the previously described findings do not appear to correlate with today's CT finding.  Outpatient nonemergent bilateral diagnostic mammography and possibly left ultrasound are recommended for further evaluation, if the patient has not had intercurrent mammography elsewhere since 2009 (that is the most recent date for which we have record of mammography at this institution).  These findings and recommendations called to Dr. Effie Shy by Dr. Chilton Si on 01/04/2013 at 9:30 p.m.  **END ADDENDUM** SIGNED BY: Harrel Lemon, M.D.   01/04/2013  *RADIOLOGY REPORT*  Clinical Data: Abdominal pain, reported history of hepatitis C  CT ABDOMEN AND PELVIS WITH CONTRAST  Technique:  Multidetector CT imaging of the abdomen and pelvis was performed following the standard protocol during bolus administration of intravenous contrast.  Contrast: OMNIPAQUE IOHEXOL 300 MG/ML  SOLN  Comparison: Abdominal radiograph same date  Findings: Minimal dependent bibasilar atelectasis noted.  In the left breast inferiorly, there is a lobulated 2.6 cm mass. Lateral to this, there is metallic artifact image 5 likely from previous ultrasound guided core biopsy in 2009.  Lung bases are clear.  Trace perihepatic ascites is present. There are numerous dilated loops of small bowel to the level of the  ileum, with abrupt transition point image 71 and distal terminal ileal decompression.  Small bowel fecalization is present with bowel wall thickening involving the dilated small bowel loops, measuring 7 mm.  Surrounding free fluid is evident.  Colon is unremarkable.  The appendix is normal.  No free air is identified.  Liver, gallbladder, adrenal glands, kidneys, spleen, and pancreas are normal.  No lymphadenopathy.  Mild atheromatous aortic calcification without aneurysm.  No acute osseous abnormality. Uterus and ovaries are normal.  IMPRESSION: Small bowel obstruction, wall edema, and free fluid to the level of a transition point in the lower right pelvis.  This may be due to adhesion or occult mass. There is a risk of bowel ischemia.  Findings called to Dr. Effie Shy by Dr. Chilton Si on 01/04/2013 at 9:15 p.m.   Original Report Authenticated By: Christiana Pellant, M.D.    Dg Abd Acute W/chest  01/04/2013  *RADIOLOGY REPORT*  Clinical Data: Abdominal pain and distention.  Nausea.  ACUTE ABDOMEN SERIES (ABDOMEN 2 VIEW & CHEST 1 VIEW)  Comparison: 06/28/2004 CT scan  Findings: Interstitial accentuation in the lungs may be from tobacco use. Chest otherwise unremarkable.  Abnormal air-fluid levels present in the left abdomen, within mildly dilated loops of small bowel.  Some of these air-fluid levels are at differing vertical heights in the same loop.  There may be some degree of small bowel mucosal irregularity on the supine view and this vicinity.  No free intraperitoneal gas observed.  IMPRESSION:  1.  Abnormal bowel gas pattern with air-fluid levels in the left abdomen within mildly dilated loops of small bowel.  Differing vertical heights of small bowel air fluid levels may reflect early obstruction.   Original Report Authenticated By: Gaylyn Rong, M.D.     Anti-infectives: Anti-infectives   None       Assessment/Plan SBO - improving only out NG/24 hours, KUB improved 1.  Clamp NG for 4 hours and  start clear sips, if worse restart suction, if improved d/c NG and advance diet 2.  Ambulate and IS 3.  VTE - lovenox & SCD's  Constipation 2/2 long term narcotic use for arthritis and recent increase in use 1.  Smog enema  Arthritis & chronic pain Hep C H/o TIA HLD Adjustment d/o with mixed anxiety and depression Left Breast lump - being seen by breast center   LOS: 2 days    DORT, Dinesh Ulysse 01/06/2013, 8:11 AM Pager: [phone]

## 2013-01-06 NOTE — Progress Notes (Signed)
SMOG giving with 2-3 small hard round pebbles

## 2013-01-07 ENCOUNTER — Encounter: Payer: Medicaid Other | Admitting: Internal Medicine

## 2013-01-07 ENCOUNTER — Inpatient Hospital Stay (HOSPITAL_COMMUNITY): Payer: Medicaid Other

## 2013-01-07 NOTE — Progress Notes (Signed)
I have seen and examined the patient and agree with the assessment and plans. Will check a SBFT  Nylia Gavina A. Magnus Ivan  MD, FACS

## 2013-01-07 NOTE — Progress Notes (Signed)
Subjective: Pt feels a little better, but still c/o a lot of RUQ abdominal pain which comes and goes.  She is tolerating her liquids well and the pain is not affected by her diet.  She is ambulating well through the halls.  She is passing gas, but is still burping a lot.    Objective: Vital signs in last 24 hours: Temp:  [98.1 F (36.7 C)-98.8 F (37.1 C)] 98.7 F (37.1 C) (04/29 0541) Pulse Rate:  [55-65] 55 (04/29 0541) Resp:  [16-20] 16 (04/29 0541) BP: (123-146)/(68-86) 136/76 mmHg (04/29 0541) SpO2:  [97 %-99 %] 98 % (04/29 0541) Last BM Date: 01/06/13  Intake/Output from previous day: 04/28 0701 - 04/29 0700 In: 487 [P.O.:180; I.V.:307] Out: 400 [Urine:400] Intake/Output this shift:    PE: Gen:  Alert, NAD, pleasant Abd: Soft, mildly tender to palp in RUQ, ND, +BS, no HSM, no abdominal scars noted   Lab Results:   Recent Labs  01/04/13 1355 01/05/13 0650  WBC 11.4* 9.4  HGB 14.2 13.0  HCT 40.8 38.1  PLT 240 215   BMET  Recent Labs  01/04/13 1355 01/05/13 0650  NA 137 141  K 4.2 3.7  CL 102 108  CO2 22 24  GLUCOSE 97 157*  BUN 12 9  CREATININE 0.73 0.68  CALCIUM 9.7 8.5   PT/INR No results found for this basename: LABPROT, INR,  in the last 72 hours CMP     Component Value Date/Time   NA 141 01/05/2013 0650   K 3.7 01/05/2013 0650   CL 108 01/05/2013 0650   CO2 24 01/05/2013 0650   GLUCOSE 157* 01/05/2013 0650   BUN 9 01/05/2013 0650   CREATININE 0.68 01/05/2013 0650   CALCIUM 8.5 01/05/2013 0650   PROT 7.0 01/04/2013 1355   ALBUMIN 3.9 01/04/2013 1355   AST 19 01/04/2013 1355   ALT 16 01/04/2013 1355   ALKPHOS 69 01/04/2013 1355   BILITOT 0.6 01/04/2013 1355   GFRNONAA >90 01/05/2013 0650   GFRAA >90 01/05/2013 0650   Lipase     Component Value Date/Time   LIPASE 11 01/04/2013 1355       Studies/Results: Dg Abd 1 View  01/06/2013  *RADIOLOGY REPORT*  Clinical Data: Low abdominal pain.  Small bowel obstruction.  ABDOMEN - 1 VIEW   Comparison: CT abdomen pelvis and acute abdominal series 01/04/2013.  Findings: There is retained oral contrast in the distal transverse and descending colon.  Mild gaseous distention of small bowel loops in the central abdomen, measuring up to 3.3 cm.  Gas is seen in nondistended colon and in the rectum.  IMPRESSION: Resolving small bowel obstruction.   Original Report Authenticated By: Leanna Battles, M.D.     Anti-infectives: Anti-infectives   None       Assessment/Plan SBO - KUB from yesterday improved, today's pending, still c/o abdominal pain 1. NG d/c yesterday, on clears and tolerating well 2. Ambulate and IS  3. VTE - lovenox & SCD's  4. Concerned given her lack of abdominal surgery history that her SBO is not resolving and she may need an ex lap or diagnostic laparoscopy.  Will discuss with Dr. Magnus Ivan 5. Told her sister its okay to bring her outside food if consistent with clear liquids (prune juice and better chicken broth)  Constipation 2/2 long term narcotic use for arthritis and recent increase in use  1. Having loose BM's which are watery, push fluids Arthritis & chronic pain  Hep C  H/o TIA  HLD  Adjustment d/o with mixed anxiety and depression  Left Breast lump - being seen by breast center     LOS: 3 days    DORT, Juliett Eastburn 01/07/2013, 10:00 AM Pager: [phone]

## 2013-01-08 MED ORDER — ENSURE COMPLETE PO LIQD
237.0000 mL | Freq: Two times a day (BID) | ORAL | Status: DC
  Administered 2013-01-08 – 2013-01-09 (×3): 237 mL via ORAL

## 2013-01-08 MED ORDER — POLYETHYLENE GLYCOL 3350 17 G PO PACK
17.0000 g | PACK | Freq: Four times a day (QID) | ORAL | Status: DC
  Administered 2013-01-08 – 2013-01-09 (×2): 17 g via ORAL
  Filled 2013-01-08 (×4): qty 1

## 2013-01-08 NOTE — Consult Note (Signed)
[facility] Reason for consult: possible small bowel obstruction Referring Physician: CCS. PCP: Dr. Burnadette Pop [name] is an 57 y.o. Cohen.  HPI: patient has been in her usual state of health until several weeks ago. 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 Recently, her OxyContin was increased to 10 mg TID. Following this increasing dosage the patient noticed increasing constipation. She has never had any abdominal or pelvic surgery. She developed severe abdominal pain with nausea and vomiting in the distended abdomen positioning to emergency room with acute abdominal series and subsequent CT scan consistent with SBO. She had an NG tube for a while now is taking small amounts of liquids without problems. She is having no fever or chills. She is seen no blood in her stool. She has never had colonoscopy. She has history of hepatitis C as normal LFT's. Upper G.I. small bowel follow-through obtained today interpreted as normal with no sign of SBO. Patient clinically feeling better.  Past Medical History  Diagnosis Date  . Hepatitis C   . Jaundice   . Breast disorder     lump in left breast  . TIA (transient ischemic attack) OCT 2012  . Hyperlipidemia   . Varicose veins   . Brain aneurysm 2012  . Adjustment disorder with mixed anxiety and depressed mood 01/05/2013    History reviewed. No pertinent past surgical history.  Family History  Problem Relation Age of Onset  . Diabetes Paternal Grandfather   . Diabetes Paternal Grandmother   . Cancer Father     lung  . Diabetes Mother   . Heart disease Mother   . Cancer Mother     cervical  . Diabetes Brother   . Stroke Brother   . Heart disease Sister   . Diabetes Sister   . Cancer Sister     cervical    Social History:  reports that she has been smoking.  She has never used smokeless tobacco. She reports that  drinks alcohol. She reports that  she does not use illicit drugs.  Allergies:  Allergies  Allergen Reactions  . Ciprofloxacin Hcl Palpitations    Medications; . bisacodyl  10 mg Rectal Daily  . enoxaparin (LOVENOX) injection  40 mg Subcutaneous Q24H  . feeding supplement  237 mL Oral BID BM  . lip balm   Topical BID  . pantoprazole (PROTONIX) IV  40 mg Intravenous QHS   PRN Meds acetaminophen, ALPRAZolam, alum & mag hydroxide-simeth, diphenhydrAMINE, HYDROmorphone (DILAUDID) injection, ketorolac, magic mouthwash, menthol-cetylpyridinium, metoprolol, ondansetron, phenol, promethazine Results for orders placed during the hospital encounter of 01/04/13 (from the past 48 hour(s))  CLOSTRIDIUM DIFFICILE BY PCR     Status: None   Collection Time    01/07/13  8:30 PM      Result Value Range   C difficile by pcr NEGATIVE  NEGATIVE    Dg Abd 1 View  01/07/2013  *RADIOLOGY REPORT*  Clinical Data: Abdominal pain and constipation.  Small bowel obstruction.  ABDOMEN - 1 VIEW  Comparison: 01/06/2013.  Findings: Dilated mid upper abdominal small bowel loop measuring up to 4.3 cm.  This may reflect result of underlying small bowel obstruction as best appreciated on recent CT.  Decrease in number of gas distended small bowel loops.  The possibility of free intraperitoneal air cannot be addressed on a supine view.  IMPRESSION: Decrease in number of gas distended small bowel  loops.  Residual gas distended small bowel loop upper mid abdomen may represent result of previously noted small bowel obstruction.   Original Report Authenticated By: Lacy Duverney, M.D.    Dg Kayleen Memos W/small Bowel  01/08/2013  *RADIOLOGY REPORT*  Clinical Data:Small bowel obstruction.  UPPER GI W/ SMALL BOWEL  Technique: Upper GI series performed with high density barium and effervescent agent. Thin barium also used.  Subsequently, serial images of the small bowel were obtained including spot views of the terminal ileum.  Fluoroscopy Time: 1 minute and 46 seconds   Comparison: CT scan 01/04/2013  Findings: Initial barium swallows demonstrate normal esophageal motility.  No intrinsic or extrinsic lesions of the esophagus are identified.  There is a small sliding-type hiatal hernia.  No gastroesophageal reflux was demonstrated.  The stomach, duodenal bulb and C-loop are unremarkable.  Small bowel follow-through examination demonstrates normal transit time of 90 minutes.  Mildly dilated mid small bowel loops but normal mucosal fold pattern.  The distal and terminal ileum are normal.  IMPRESSION: Normal upper GI and small bowel follow-through examination.   Original Report Authenticated By: Rudie Meyer, M.D.                Blood pressure 132/91, pulse 62, temperature 98.6 F (37 C), temperature source Oral, resp. rate 18, height 5\' 6"  (1.676 m), weight 71.3 kg (157 lb 3 oz), SpO2 98.00%.  Physical exam:   General-- [name] in no acute distress. Mucous membranes moist sclera nonicteric Heart-- RRR Lungs--clear  Abdomen-- non-distended and generally soft with normal bowel sounds minimal tenderness   Assessment: 1. Abdominal distention/nausea and vomiting. Small bowel follow-through normal ruling out SBO. But wonder if patient developed decreased G.I. motility as a result of increasing narcotic dose 2. Hepatitis C history with normal LFTs. Patient reports that she saw Dr. in [place] for hepatitis C. After having this for a number of years, apparently the virus cleared. 3. Chronic narcotic use for knee pain  Plan: would continue on clear liquid diet and will add Miralax to see if we can obtain spontaneous bowel movements. If this is successful, we may try to clean her out further consider colonoscopy at some point in the future.   [name],[name] 01/08/2013, 3:09 PM

## 2013-01-08 NOTE — Progress Notes (Signed)
Subjective: Pt doesn't feel much better.  Yesterday night her pain was improved and she was able to eat, but last night she had abdominal cramping.  She has been having constant diarrhea-cdiff pending.  BM's are watery with some brownish stool.  Pt is ambulating well and tolerating clears most of the time.    Objective: Vital signs in last 24 hours: Temp:  [98 F (36.7 C)-98.1 F (36.7 C)] 98 F (36.7 C) (04/30 0543) Pulse Rate:  [57-64] 63 (04/30 0543) Resp:  [15-18] 18 (04/30 0543) BP: (121-144)/(52-88) 144/88 mmHg (04/30 0543) SpO2:  [99 %-100 %] 99 % (04/30 0543) Last BM Date: 01/07/13  Intake/Output from previous day: 04/29 0701 - 04/30 0700 In: 3300 [I.V.:3300] Out: 200 [Stool:200] Intake/Output this shift:    PE: Gen:  Alert, NAD, pleasant Abd: Soft, mild tenderness in upper abdomen, ND, +BS, no HSM,  no abdominal scars noted   Lab Results:  No results found for this basename: WBC, HGB, HCT, PLT,  in the last 72 hours BMET No results found for this basename: NA, K, CL, CO2, GLUCOSE, BUN, CREATININE, CALCIUM,  in the last 72 hours PT/INR No results found for this basename: LABPROT, INR,  in the last 72 hours CMP     Component Value Date/Time   NA 141 01/05/2013 0650   K 3.7 01/05/2013 0650   CL 108 01/05/2013 0650   CO2 24 01/05/2013 0650   GLUCOSE 157* 01/05/2013 0650   BUN 9 01/05/2013 0650   CREATININE 0.68 01/05/2013 0650   CALCIUM 8.5 01/05/2013 0650   PROT 7.0 01/04/2013 1355   ALBUMIN 3.9 01/04/2013 1355   AST 19 01/04/2013 1355   ALT 16 01/04/2013 1355   ALKPHOS 69 01/04/2013 1355   BILITOT 0.6 01/04/2013 1355   GFRNONAA >90 01/05/2013 0650   GFRAA >90 01/05/2013 0650   Lipase     Component Value Date/Time   LIPASE 11 01/04/2013 1355       Studies/Results: Dg Abd 1 View  01/07/2013  *RADIOLOGY REPORT*  Clinical Data: Abdominal pain and constipation.  Small bowel obstruction.  ABDOMEN - 1 VIEW  Comparison: 01/06/2013.  Findings: Dilated mid upper  abdominal small bowel loop measuring up to 4.3 cm.  This may reflect result of underlying small bowel obstruction as best appreciated on recent CT.  Decrease in number of gas distended small bowel loops.  The possibility of free intraperitoneal air cannot be addressed on a supine view.  IMPRESSION: Decrease in number of gas distended small bowel loops.  Residual gas distended small bowel loop upper mid abdomen may represent result of previously noted small bowel obstruction.   Original Report Authenticated By: Lacy Duverney, M.D.     Anti-infectives: Anti-infectives   None       Assessment/Plan SBO - no history of surgery, KUB from yesterday improved, still c/o abdominal pain/cramping, nausea 1. NG d/c yesterday, on clears and tolerating well  2. Ambulate and IS  3. VTE - lovenox & SCD's  4. Concerned that she may have something else going on as she does not appear to be completely obstructed at this point.  May need scoped by GI, will consult.  Chronic Constipation 2/2 long term narcotic use for arthritis and recent increase in use  Diarrhea 1. Having loose BM's which are watery, ordered Cdiff which is pending Arthritis & chronic pain  Hep C  H/o TIA  HLD  Adjustment d/o with mixed anxiety and depression  Left Breast lump - being seen  by breast center     LOS: 4 days    DORT, Zennie Ayars 01/08/2013, 8:09 AM Pager: [phone]

## 2013-01-08 NOTE — Progress Notes (Signed)
I have seen and examined the patient and agree with the assessment and plans.  Waiting for final read on SBFT.  GI consult.  If no obstruction, question viral illness  Berneta Sconyers A. Magnus Ivan  MD, FACS

## 2013-01-09 MED ORDER — TRAMADOL HCL 50 MG PO TABS: 50.0000 mg | ORAL_TABLET | Freq: Four times a day (QID) | ORAL | Status: DC | PRN

## 2013-01-09 MED ORDER — SODIUM CHLORIDE 0.9 % IJ SOLN
3.0000 mL | Freq: Two times a day (BID) | INTRAMUSCULAR | Status: DC
  Administered 2013-01-09: 3 mL via INTRAVENOUS

## 2013-01-09 MED ORDER — SODIUM CHLORIDE 0.9 % IJ SOLN: 3.0000 mL | INTRAMUSCULAR | Status: DC | PRN

## 2013-01-09 NOTE — Discharge Summary (Signed)
Physician Discharge Summary  Patient ID: [name] MRN: [id number] DOB/AGE: [birth year] 57 y.o.  Admit date: 01/04/2013 Discharge date: 01/09/2013  Admitting Diagnosis: Small Bowel Obstruction Constipation Abdominal pain in the epigastric/periumbilical/LUQ area Nausea and vomiting  Discharge Diagnosis Patient Active Problem List   Diagnosis Date Noted  . SBO (small bowel obstruction) 01/05/2013  . Adjustment disorder with mixed anxiety and depressed mood 01/05/2013  . Chronic pain syndrome 01/05/2013  . Thyroid cyst 01/05/2013  . Smoking 12/05/2012  . Hepatitis C   . Jaundice   . Mass of breast, left   . Hyperlipidemia   . Varicose veins   . Brain aneurysm   . TIA (transient ischemic attack) 06/12/2011    Consultants Dr. Vertell Novak - Gastroenterology  Imaging: Dg Abd 1 View  01/07/2013  *RADIOLOGY REPORT*  Clinical Data: Abdominal pain and constipation.  Small bowel obstruction.  ABDOMEN - 1 VIEW  Comparison: 01/06/2013.  Findings: Dilated mid upper abdominal small bowel loop measuring up to 4.3 cm.  This may reflect result of underlying small bowel obstruction as best appreciated on recent CT.  Decrease in number of gas distended small bowel loops.  The possibility of free intraperitoneal air cannot be addressed on a supine view.  IMPRESSION: Decrease in number of gas distended small bowel loops.  Residual gas distended small bowel loop upper mid abdomen may represent result of previously noted small bowel obstruction.   Original Report Authenticated By: Lacy Duverney, M.D.    Dg Kayleen Memos W/small Bowel  01/08/2013  *RADIOLOGY REPORT*  Clinical Data:Small bowel obstruction.  UPPER GI W/ SMALL BOWEL  Technique: Upper GI series performed with high density barium and effervescent agent. Thin barium also used.  Subsequently, serial images of the small bowel were obtained including spot views of the terminal ileum.  Fluoroscopy Time: 1 minute and 46 seconds  Comparison: CT scan  01/04/2013  Findings: Initial barium swallows demonstrate normal esophageal motility.  No intrinsic or extrinsic lesions of the esophagus are identified.  There is a small sliding-type hiatal hernia.  No gastroesophageal reflux was demonstrated.  The stomach, duodenal bulb and C-loop are unremarkable.  Small bowel follow-through examination demonstrates normal transit time of 90 minutes.  Mildly dilated mid small bowel loops but normal mucosal fold pattern.  The distal and terminal ileum are normal.  IMPRESSION: Normal upper GI and small bowel follow-through examination.   Original Report Authenticated By: Rudie Meyer, M.D.     Procedures None  Hospital Course:  Patient is a 57 year old female who presented to [facility] with abdominal pain, nausea and vomiting.  Workup showed leukocytosis.  CT scan with contrast of abdomen and pelvis revealed a small bowel obstruction with wall edema and free fluid to the level of the transition point in the lower right pelvis which could be caused by an adhesion or occult mass.  It was noted that there was a possible risk of bowel ischemia.  Radiologic exams of the abdomen revealed an abnormal bowel gas pattern with air-fluid levels in the left abdomen with mildly dilated loops of small bowel consistent with SBO.  Patient was admitted for SBO observation and pain management and further work-up.  Patient had intermittent cramping and diarrhea for several days, which prevented her from going home.  Diet was advanced as tolerated.   She had to follow-up abdominal radiologic exams, which showed a decrease in the number of gas distended small bowel loops consistent with a resolving SBO.  Patient was still complaining of  intermittent abdominal pain and diarrhea, so an upper GI with small bowel follow-through was completed which revealed a normal examination - at which point Dr. Randa Evens was consulted.  On hospital day 6, the patient was voiding well, diarrhea had improved, tolerating  liquid and ensure diet, ambulating well, pain well controlled, vital signs stable, and felt stable for discharge home.  Patient will follow up in our office as needed and knows to call with questions or concerns.  Patient will need to follow-up with Dr. Randa Evens, gastroenterologist in 2-3 weeks to arrange an outpatient colonoscopy.     Medication List    STOP taking these medications       oxyCODONE 10 MG 12 hr tablet  Commonly known as:  OXYCONTIN      TAKE these medications       ALPRAZolam 1 MG tablet  Commonly known as:  XANAX  Take 1 mg by mouth 2 (two) times daily as needed for anxiety.     aspirin 325 MG tablet  Take 325 mg by mouth daily.     Krill Oil Caps  Take 1 capsule by mouth daily.     Psyllium Husk Powd  Take 5 mLs by mouth daily.     traMADol 50 MG tablet  Commonly known as:  ULTRAM  Take 1-2 tablets (50-100 mg total) by mouth every 6 (six) hours as needed for pain.         Follow-up Information   Call [facility],MD, MD. (As needed)    Contact information:   [address] [place] [address] [phone]       Call [name], MD. (As needed)    Contact information:   [address] [place] [address] [phone]       Schedule an appointment as soon as possible for a visit with [facility] Burna Mortimer, MD.   Contact information:   [address]                         [address] [place] [place] [address] [phone]       Signed: Marchelle Folks A. Fabienne Bruns, PA-S [facility] [phone]  01/09/2013, 9:39 AM

## 2013-01-09 NOTE — Progress Notes (Signed)
Patient discharged to home with instructions. 

## 2013-01-09 NOTE — Progress Notes (Signed)
Subjective: Pt feels a bit better today.  Tolerating liquids and ensure supplements.  Pt ambulating well.  Diarrhea improved.  Still having abdominal cramping and pain which is intermittent.  Pt would like to go home today.  Pt is wiling to f/u with GI on outpatient basis.    Objective: Vital signs in last 24 hours: Temp:  [98.6 F (37 C)] 98.6 F (37 C) (05/01 0500) Pulse Rate:  [57-62] 59 (05/01 0500) Resp:  [18] 18 (05/01 0500) BP: (129-134)/(68-91) 134/68 mmHg (05/01 0500) SpO2:  [98 %-99 %] 98 % (05/01 0500) Last BM Date: 01/08/13  Intake/Output from previous day: 04/30 0701 - 05/01 0700 In: 1700 [I.V.:1700] Out: 1701 [Urine:1700; Stool:1] Intake/Output this shift:    PE: Gen:  Alert, NAD, pleasant Abd: Soft, NT/ND, +BS, no HSM, no abdominal scars noted   Lab Results:  No results found for this basename: WBC, HGB, HCT, PLT,  in the last 72 hours BMET No results found for this basename: NA, K, CL, CO2, GLUCOSE, BUN, CREATININE, CALCIUM,  in the last 72 hours PT/INR No results found for this basename: LABPROT, INR,  in the last 72 hours CMP     Component Value Date/Time   NA 141 01/05/2013 0650   K 3.7 01/05/2013 0650   CL 108 01/05/2013 0650   CO2 24 01/05/2013 0650   GLUCOSE 157* 01/05/2013 0650   BUN 9 01/05/2013 0650   CREATININE 0.68 01/05/2013 0650   CALCIUM 8.5 01/05/2013 0650   PROT 7.0 01/04/2013 1355   ALBUMIN 3.9 01/04/2013 1355   AST 19 01/04/2013 1355   ALT 16 01/04/2013 1355   ALKPHOS 69 01/04/2013 1355   BILITOT 0.6 01/04/2013 1355   GFRNONAA >90 01/05/2013 0650   GFRAA >90 01/05/2013 0650   Lipase     Component Value Date/Time   LIPASE 11 01/04/2013 1355       Studies/Results: Dg Abd 1 View  01/07/2013  *RADIOLOGY REPORT*  Clinical Data: Abdominal pain and constipation.  Small bowel obstruction.  ABDOMEN - 1 VIEW  Comparison: 01/06/2013.  Findings: Dilated mid upper abdominal small bowel loop measuring up to 4.3 cm.  This may reflect result of  underlying small bowel obstruction as best appreciated on recent CT.  Decrease in number of gas distended small bowel loops.  The possibility of free intraperitoneal air cannot be addressed on a supine view.  IMPRESSION: Decrease in number of gas distended small bowel loops.  Residual gas distended small bowel loop upper mid abdomen may represent result of previously noted small bowel obstruction.   Original Report Authenticated By: Lacy Duverney, M.D.    Dg Kayleen Memos W/small Bowel  01/08/2013  *RADIOLOGY REPORT*  Clinical Data:Small bowel obstruction.  UPPER GI W/ SMALL BOWEL  Technique: Upper GI series performed with high density barium and effervescent agent. Thin barium also used.  Subsequently, serial images of the small bowel were obtained including spot views of the terminal ileum.  Fluoroscopy Time: 1 minute and 46 seconds  Comparison: CT scan 01/04/2013  Findings: Initial barium swallows demonstrate normal esophageal motility.  No intrinsic or extrinsic lesions of the esophagus are identified.  There is a small sliding-type hiatal hernia.  No gastroesophageal reflux was demonstrated.  The stomach, duodenal bulb and C-loop are unremarkable.  Small bowel follow-through examination demonstrates normal transit time of 90 minutes.  Mildly dilated mid small bowel loops but normal mucosal fold pattern.  The distal and terminal ileum are normal.  IMPRESSION: Normal upper GI and small  bowel follow-through examination.   Original Report Authenticated By: Rudie Meyer, M.D.     Anti-infectives: Anti-infectives   None       Assessment/Plan Abdominal distension, cramping, pain, nausea and vomiting  1. Tolerating liquids and ensure supplements 2. Ambulate and IS  3. VTE - lovenox & SCD's  4. Can likely go home today to continue liquid diet for now, if okay with GI.  She can f/u as an outpatient, would like them to set up OP f/u for colonoscopy which was was previously set up for and OP offices  visits.  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

## 2013-01-09 NOTE — Progress Notes (Signed)
I have seen and examined the patient and agree with the assessment and plans.  Juel Ripley A. Eulalia Ellerman  MD, FACS  

## 2013-01-09 NOTE — Progress Notes (Signed)
[facility] PROGRESS NOTE Subjective Pt still bloated but passing air and having BMs Is going home today  Objective: Vital signs in last 24 hours: Temp:  [98.6 F (37 C)] 98.6 F (37 C) (05/01 0500) Pulse Rate:  [57-62] 59 (05/01 0500) Resp:  [18] 18 (05/01 0500) BP: (129-134)/(68-91) 134/68 mmHg (05/01 0500) SpO2:  [98 %-99 %] 98 % (05/01 0500) Last BM Date: 01/08/13  Intake/Output from previous day: 04/30 0701 - 05/01 0700 In: 1700 [I.V.:1700] Out: 1701 [Urine:1700; Stool:1] Intake/Output this shift:      Lab Results: No results found for this basename: WBC, HGB, HCT, PLT,  in the last 72 hours BMET No results found for this basename: NA, K, CL, CO2, CREATININE,  in the last 72 hours LFT No results found for this basename: PROT, AST, ALT, ALKPHOS, BILITOT, BILIDIR, IBILI,  in the last 72 hours PT/INR No results found for this basename: LABPROT, INR,  in the last 72 hours PANCREAS No results found for this basename: LIPASE,  in the last 72 hours       Studies/Results: Dg Abd 1 View  01/07/2013  *RADIOLOGY REPORT*  Clinical Data: Abdominal pain and constipation.  Small bowel obstruction.  ABDOMEN - 1 VIEW  Comparison: 01/06/2013.  Findings: Dilated mid upper abdominal small bowel loop measuring up to 4.3 cm.  This may reflect result of underlying small bowel obstruction as best appreciated on recent CT.  Decrease in number of gas distended small bowel loops.  The possibility of free intraperitoneal air cannot be addressed on a supine view.  IMPRESSION: Decrease in number of gas distended small bowel loops.  Residual gas distended small bowel loop upper mid abdomen may represent result of previously noted small bowel obstruction.   Original Report Authenticated By: Lacy Duverney, M.D.    Dg Kayleen Memos W/small Bowel  01/08/2013  *RADIOLOGY REPORT*  Clinical Data:Small bowel obstruction.  UPPER GI W/ SMALL BOWEL  Technique: Upper GI series performed with high density  barium and effervescent agent. Thin barium also used.  Subsequently, serial images of the small bowel were obtained including spot views of the terminal ileum.  Fluoroscopy Time: 1 minute and 46 seconds  Comparison: CT scan 01/04/2013  Findings: Initial barium swallows demonstrate normal esophageal motility.  No intrinsic or extrinsic lesions of the esophagus are identified.  There is a small sliding-type hiatal hernia.  No gastroesophageal reflux was demonstrated.  The stomach, duodenal bulb and C-loop are unremarkable.  Small bowel follow-through examination demonstrates normal transit time of 90 minutes.  Mildly dilated mid small bowel loops but normal mucosal fold pattern.  The distal and terminal ileum are normal.  IMPRESSION: Normal upper GI and small bowel follow-through examination.   Original Report Authenticated By: Rudie Meyer, M.D.     Medications: I have reviewed the patient's current medications.  Assessment/Plan: 1. Abd pain, bloating, diarrhea.  ? Cause. ? Due to narcotics, viral etc.  The SBFT was normal w/o SBO.  I discussed LR diet with her and have asked her to make an appointment in about 2-3 weeks to arrange an OP colonoscopy   Dhyana Bastone JR,Leonora Gores L 01/09/2013, 9:59 AM

## 2013-01-20 ENCOUNTER — Other Ambulatory Visit: Payer: Self-pay | Admitting: Physician Assistant

## 2013-01-20 DIAGNOSIS — N6325 Unspecified lump in the left breast, overlapping quadrants: Secondary | ICD-10-CM

## 2013-01-31 ENCOUNTER — Other Ambulatory Visit: Payer: Medicaid Other

## 2013-02-12 ENCOUNTER — Ambulatory Visit
Admission: RE | Admit: 2013-02-12 | Discharge: 2013-02-12 | Disposition: A | Discharge status: Home / Self Care | Payer: Self-pay | Source: Ambulatory Visit | Attending: Physician Assistant | Admitting: Physician Assistant

## 2013-03-11 HISTORY — PX: COLON SURGERY: SHX602

## 2013-03-31 ENCOUNTER — Encounter (HOSPITAL_COMMUNITY): Payer: Self-pay | Admitting: Emergency Medicine

## 2013-03-31 ENCOUNTER — Inpatient Hospital Stay (HOSPITAL_COMMUNITY)
Admission: EM | Admit: 2013-03-31 | Discharge: 2013-04-07 | DRG: 330 | Disposition: A | Discharge status: Home / Self Care | Payer: Medicaid Other | Attending: General Surgery | Admitting: General Surgery

## 2013-03-31 ENCOUNTER — Emergency Department (HOSPITAL_COMMUNITY): Payer: Medicaid Other

## 2013-03-31 DIAGNOSIS — D371 Neoplasm of uncertain behavior of stomach: Principal | ICD-10-CM | POA: Diagnosis present

## 2013-03-31 DIAGNOSIS — F4323 Adjustment disorder with mixed anxiety and depressed mood: Secondary | ICD-10-CM | POA: Diagnosis present

## 2013-03-31 DIAGNOSIS — Z5331 Laparoscopic surgical procedure converted to open procedure: Secondary | ICD-10-CM

## 2013-03-31 DIAGNOSIS — E876 Hypokalemia: Secondary | ICD-10-CM | POA: Diagnosis not present

## 2013-03-31 DIAGNOSIS — F17200 Nicotine dependence, unspecified, uncomplicated: Secondary | ICD-10-CM | POA: Diagnosis present

## 2013-03-31 DIAGNOSIS — B1920 Unspecified viral hepatitis C without hepatic coma: Secondary | ICD-10-CM | POA: Diagnosis present

## 2013-03-31 DIAGNOSIS — R599 Enlarged lymph nodes, unspecified: Secondary | ICD-10-CM | POA: Diagnosis present

## 2013-03-31 DIAGNOSIS — K56600 Partial intestinal obstruction, unspecified as to cause: Secondary | ICD-10-CM | POA: Diagnosis present

## 2013-03-31 DIAGNOSIS — R630 Anorexia: Secondary | ICD-10-CM | POA: Diagnosis present

## 2013-03-31 DIAGNOSIS — K56609 Unspecified intestinal obstruction, unspecified as to partial versus complete obstruction: Secondary | ICD-10-CM

## 2013-03-31 DIAGNOSIS — Z8673 Personal history of transient ischemic attack (TIA), and cerebral infarction without residual deficits: Secondary | ICD-10-CM

## 2013-03-31 DIAGNOSIS — D0590 Unspecified type of carcinoma in situ of unspecified breast: Secondary | ICD-10-CM | POA: Diagnosis present

## 2013-03-31 DIAGNOSIS — D0512 Intraductal carcinoma in situ of left breast: Secondary | ICD-10-CM

## 2013-03-31 DIAGNOSIS — K5669 Other intestinal obstruction: Secondary | ICD-10-CM | POA: Diagnosis present

## 2013-03-31 DIAGNOSIS — D649 Anemia, unspecified: Secondary | ICD-10-CM | POA: Diagnosis present

## 2013-03-31 DIAGNOSIS — E785 Hyperlipidemia, unspecified: Secondary | ICD-10-CM | POA: Diagnosis present

## 2013-03-31 LAB — COMPREHENSIVE METABOLIC PANEL
AST: 35 U/L (ref 0–37)
Albumin: 4 g/dL (ref 3.5–5.2)
BUN: 5 mg/dL — ABNORMAL LOW (ref 6–23)
Calcium: 9.3 mg/dL (ref 8.4–10.5)
Chloride: 103 mEq/L (ref 96–112)
Creatinine, Ser: 0.58 mg/dL (ref 0.50–1.10)
Total Protein: 7.5 g/dL (ref 6.0–8.3)

## 2013-03-31 LAB — CBC WITH DIFFERENTIAL/PLATELET
Basophils Absolute: 0 10*3/uL (ref 0.0–0.1)
Basophils Relative: 0 % (ref 0–1)
Eosinophils Absolute: 0.2 10*3/uL (ref 0.0–0.7)
Eosinophils Relative: 2 % (ref 0–5)
HCT: 44.8 % (ref 36.0–46.0)
MCH: 29 pg (ref 26.0–34.0)
MCHC: 34.2 g/dL (ref 30.0–36.0)
Monocytes Absolute: 0.5 10*3/uL (ref 0.1–1.0)
Neutro Abs: 5.6 10*3/uL (ref 1.7–7.7)
RDW: 14.5 % (ref 11.5–15.5)

## 2013-03-31 LAB — URINALYSIS, ROUTINE W REFLEX MICROSCOPIC
Glucose, UA: NEGATIVE mg/dL
Leukocytes, UA: NEGATIVE
Nitrite: NEGATIVE
Specific Gravity, Urine: <1.005 — ABNORMAL LOW (ref 1.005–1.030)
pH: 5.5 (ref 5.0–8.0)

## 2013-03-31 LAB — LIPASE, BLOOD: Lipase: 16 U/L (ref 11–59)

## 2013-03-31 MED ORDER — HYDROMORPHONE HCL PF 1 MG/ML IJ SOLN
1.0000 mg | Freq: Once | INTRAMUSCULAR | Status: AC
  Administered 2013-03-31: 1 mg via INTRAVENOUS
  Filled 2013-03-31: qty 1

## 2013-03-31 MED ORDER — ONDANSETRON HCL 4 MG/2ML IJ SOLN: 4.0000 mg | Freq: Four times a day (QID) | INTRAMUSCULAR | Status: DC | PRN

## 2013-03-31 MED ORDER — DEXTROSE-NACL 5-0.9 % IV SOLN
INTRAVENOUS | Status: DC
  Administered 2013-03-31 – 2013-04-02 (×3): via INTRAVENOUS

## 2013-03-31 MED ORDER — SODIUM CHLORIDE 0.9 % IV SOLN
1000.0000 mL | Freq: Once | INTRAVENOUS | Status: AC
  Administered 2013-03-31: 1000 mL via INTRAVENOUS

## 2013-03-31 MED ORDER — HYDROMORPHONE HCL PF 1 MG/ML IJ SOLN
1.0000 mg | Freq: Once | INTRAMUSCULAR | Status: AC
  Administered 2013-03-31: 1 mg via INTRAVENOUS

## 2013-03-31 MED ORDER — HYDROMORPHONE HCL PF 1 MG/ML IJ SOLN
1.0000 mg | INTRAMUSCULAR | Status: DC | PRN
  Administered 2013-04-01: 1 mg via INTRAVENOUS
  Filled 2013-03-31 (×3): qty 1

## 2013-03-31 MED ORDER — IOHEXOL 300 MG/ML  SOLN
100.0000 mL | Freq: Once | INTRAMUSCULAR | Status: AC | PRN
  Administered 2013-03-31: 100 mL via INTRAVENOUS

## 2013-03-31 MED ORDER — ONDANSETRON HCL 4 MG/2ML IJ SOLN
4.0000 mg | Freq: Once | INTRAMUSCULAR | Status: AC
  Administered 2013-03-31: 4 mg via INTRAVENOUS
  Filled 2013-03-31: qty 2

## 2013-03-31 MED ORDER — PANTOPRAZOLE SODIUM 40 MG IV SOLR
40.0000 mg | Freq: Every day | INTRAVENOUS | Status: DC
  Administered 2013-03-31 – 2013-04-05 (×5): 40 mg via INTRAVENOUS
  Filled 2013-03-31 (×7): qty 40

## 2013-03-31 MED ORDER — IOHEXOL 300 MG/ML  SOLN
25.0000 mL | INTRAMUSCULAR | Status: AC
  Administered 2013-03-31: 25 mL via ORAL

## 2013-03-31 MED ORDER — ALPRAZOLAM 0.5 MG PO TABS
1.0000 mg | ORAL_TABLET | Freq: Two times a day (BID) | ORAL | Status: DC | PRN
  Administered 2013-03-31 – 2013-04-01 (×3): 1 mg via ORAL
  Filled 2013-03-31: qty 1
  Filled 2013-03-31 (×2): qty 2
  Filled 2013-03-31: qty 1

## 2013-03-31 NOTE — ED Notes (Signed)
Unable to obtain urine sample at this time, pt already used the bathroom at this time

## 2013-03-31 NOTE — ED Notes (Signed)
Dr. Ramirez at bedside  

## 2013-03-31 NOTE — H&P (Signed)
[name] is an 57 y.o. female.   Chief Complaint: Abdominal pain HPI: the patient is a 57 year old female who is recently admitted to the hospital back in April 2014 for small bowel obstruction. Patient was discharged from the hospital and underwent colonoscopy which was normal. The patient returns today with a three-day history of abdominal pain which is increased today that she presented to the ER. Patient complains of some on-off abdominal pain. She states her last bowel movement was approximately 4 days ago.   The patient also has a left breast mass which she's been followed by the breast center. She is to me with Dr. Magnus Ivan next week.    Past Medical History  Diagnosis Date  . Hepatitis C   . Jaundice   . Breast disorder     lump in left breast  . TIA (transient ischemic attack) OCT 2012  . Hyperlipidemia   . Varicose veins   . Brain aneurysm 2012  . Adjustment disorder with mixed anxiety and depressed mood 01/05/2013    History reviewed. No pertinent past surgical history.  Family History  Problem Relation Age of Onset  . Diabetes Paternal Grandfather   . Diabetes Paternal Grandmother   . Cancer Father     lung  . Diabetes Mother   . Heart disease Mother   . Cancer Mother     cervical  . Diabetes Brother   . Stroke Brother   . Heart disease Sister   . Diabetes Sister   . Cancer Sister     cervical   Social History:  reports that she has been smoking.  She has never used smokeless tobacco. She reports that  drinks alcohol. She reports that she does not use illicit drugs.  Allergies:  Allergies  Allergen Reactions  . Statins Swelling    Stomach swelling & muscle ache  . Ciprofloxacin Hcl Palpitations     (Not in a hospital admission)  Results for orders placed during the hospital encounter of 03/31/13 (from the past 48 hour(s))  CBC WITH DIFFERENTIAL     Status: Abnormal   Collection Time    03/31/13  4:43 PM      Result Value Range   WBC 8.1  4.0 -  10.5 K/uL   RBC 5.27 (*) 3.87 - 5.11 MIL/uL   Hemoglobin 15.3 (*) 12.0 - 15.0 g/dL   HCT 16.1  09.6 - 04.5 %   MCV 85.0  78.0 - 100.0 fL   MCH 29.0  26.0 - 34.0 pg   MCHC 34.2  30.0 - 36.0 g/dL   RDW 40.9  81.1 - 91.4 %   Platelets 231  150 - 400 K/uL   Neutrophils Relative % 70  43 - 77 %   Neutro Abs 5.6  1.7 - 7.7 K/uL   Lymphocytes Relative 22  12 - 46 %   Lymphs Abs 1.8  0.7 - 4.0 K/uL   Monocytes Relative 6  3 - 12 %   Monocytes Absolute 0.5  0.1 - 1.0 K/uL   Eosinophils Relative 2  0 - 5 %   Eosinophils Absolute 0.2  0.0 - 0.7 K/uL   Basophils Relative 0  0 - 1 %   Basophils Absolute 0.0  0.0 - 0.1 K/uL  COMPREHENSIVE METABOLIC PANEL     Status: Abnormal   Collection Time    03/31/13  4:43 PM      Result Value Range   Sodium 137  135 - 145 mEq/L  Potassium 5.7 (*) 3.5 - 5.1 mEq/L   Comment: HEMOLYSIS AT THIS LEVEL MAY AFFECT RESULT   Chloride 103  96 - 112 mEq/L   CO2 22  19 - 32 mEq/L   Glucose, Bld 101 (*) 70 - 99 mg/dL   BUN 5 (*) 6 - 23 mg/dL   Creatinine, Ser 1.61  0.50 - 1.10 mg/dL   Calcium 9.3  8.4 - 09.6 mg/dL   Total Protein 7.5  6.0 - 8.3 g/dL   Albumin 4.0  3.5 - 5.2 g/dL   AST 35  0 - 37 U/L   ALT 20  0 - 35 U/L   Comment: HEMOLYSIS AT THIS LEVEL MAY AFFECT RESULT   Alkaline Phosphatase 61  39 - 117 U/L   Comment: HEMOLYSIS AT THIS LEVEL MAY AFFECT RESULT   Total Bilirubin 0.6  0.3 - 1.2 mg/dL   GFR calc non Af Amer >90  >90 mL/min   GFR calc Af Amer >90  >90 mL/min   Comment:            The eGFR has been calculated     using the CKD EPI equation.     This calculation has not been     validated in all clinical     situations.     eGFR's persistently     <90 mL/min signify     possible Chronic Kidney Disease.  LIPASE, BLOOD     Status: None   Collection Time    03/31/13  4:43 PM      Result Value Range   Lipase 16  11 - 59 U/L   Ct Abdomen Pelvis W Contrast  03/31/2013   *RADIOLOGY REPORT*  Clinical Data: Diffuse abdominal pain and  tenderness.  CT ABDOMEN AND PELVIS WITH CONTRAST  Technique:  Multidetector CT imaging of the abdomen and pelvis was performed following the standard protocol during bolus administration of intravenous contrast.  Contrast: OMNIPAQUE IOHEXOL 300 MG/ML  SOLN  Comparison: CT abdomen and pelvis 01/04/2013.  Diagnostic mammogram and ultrasound 02/12/2013.  Findings: A lesion inferiorly in the left breast demonstrates interval growth as previously documented by mammography and ultrasound.  Please see the mammogram and ultrasound reports for further discussion.  The lung bases are clear without focal nodule, mass, or airspace disease.  Heart size is normal.  No significant pleural or pericardial effusion is present.  The liver and spleen is within normal limits.  A small amount of free fluid is present.  The stomach, duodenum, and pancreas are unremarkable.  The common bile duct and gallbladder are normal. The adrenal glands are normal bilaterally.  The kidneys and ureters are unremarkable.  The rectosigmoid colon is within normal limits.  The remainder the colon is unremarkable.  The appendix is visualized and normal.  A partial distal small bowel obstruction is evident to at the same site of the previous exam.  A soft tissue mass is suggested, measuring 2.1 x 1.8 cm.  Wall thickening continues for several loops proximal to this transition point.  No significant adenopathy is present.  Moderate to valve ascites are present.  The uterus and adnexa are within normal limits for age.  The bone windows demonstrate minimal endplate degenerative changes. No focal lytic or blastic lesions are present.  IMPRESSION:  1.  Persistent or recurrent to distal partial small bowel obstruction with transition point near the terminal ileum.  There appears to be a soft tissue mass which is concerning for neoplasm. Inflammatory bowel  disease is considered less likely. 2.  Increasing abdominal ascites with layering fluid throughout the  abdomen as described. 3.  Increasing size of left breast mass.  Please see the discussion and recommendation for excisional biopsy on the diagnostic mammogram and ultrasound 02/12/2013.   Original Report Authenticated By: Marin Roberts, M.D.    Review of Systems  Constitutional: Negative.   HENT: Negative.   Respiratory: Negative.   Cardiovascular: Negative.   Gastrointestinal: Positive for abdominal pain. Negative for nausea, vomiting and diarrhea.  Genitourinary: Negative.   Musculoskeletal: Negative.   Skin: Negative.   Neurological: Negative.   All other systems reviewed and are negative.    Blood pressure 127/76, pulse 59, temperature 97.8 F (36.6 C), temperature source Oral, resp. rate 16, SpO2 100.00%. Physical Exam  Nursing note and vitals reviewed. Constitutional: She is oriented to person, place, and time. She appears well-developed and well-nourished.  HENT:  Head: Normocephalic and atraumatic.  Eyes: Conjunctivae and EOM are normal. Pupils are equal, round, and reactive to light.  Neck: Normal range of motion. Neck supple.  Cardiovascular: Normal rate, regular rhythm and normal heart sounds.   Respiratory: Effort normal and breath sounds normal.  GI: Soft. She exhibits no distension and no mass. There is tenderness (RLQ). There is no rebound and no guarding.  Musculoskeletal: Normal range of motion.  Neurological: She is alert and oriented to person, place, and time.  Skin: Skin is warm and dry.     Assessment/Plan 57 year old female with a small bowel obstruction and a small bowel mass, and left breast mass 1. I will admit the patient to the hospital, n.p.o., and hydrate the patient at this time 2. I will discuss the patient with Dr. Magnus Ivan and the likely need for a diagnostic laparoscopy and possible bowel resection.  Marigene Ehlers., Sacha Topor 03/31/2013, 7:39 PM

## 2013-03-31 NOTE — ED Provider Notes (Signed)
History    CSN: [id number] Arrival date & time 03/31/13  1551  First MD Initiated Contact with Patient 03/31/13 1606     Chief Complaint  Patient presents with  . Abdominal Pain   (Consider location/radiation/quality/duration/timing/severity/associated sxs/prior Treatment) HPI  Patient presents with abdominal pain.  Pain began 3 days ago.  Pain is similar to that she experienced during prior small bowel obstruction. There is no clear precipitant, and since onset the pain has been persistent, with no relief from home OTC medication or narcotics. Patient has had bowel movements, following use of suppositories and enemas. She is by mouth intolerance, with anorexia. There is no report of fever, chills, dyspnea, chest pain, confusion, disorientation.   Past Medical History  Diagnosis Date  . Hepatitis C   . Jaundice   . Breast disorder     lump in left breast  . TIA (transient ischemic attack) OCT 2012  . Hyperlipidemia   . Varicose veins   . Brain aneurysm 2012  . Adjustment disorder with mixed anxiety and depressed mood 01/05/2013   History reviewed. No pertinent past surgical history. Family History  Problem Relation Age of Onset  . Diabetes Paternal Grandfather   . Diabetes Paternal Grandmother   . Cancer Father     lung  . Diabetes Mother   . Heart disease Mother   . Cancer Mother     cervical  . Diabetes Brother   . Stroke Brother   . Heart disease Sister   . Diabetes Sister   . Cancer Sister     cervical   History  Substance Use Topics  . Smoking status: Current Every Day Smoker -- 0.50 packs/day for 35 years  . Smokeless tobacco: Never Used  . Alcohol Use: Yes     Comment: seldom   OB History   Grav Para Term Preterm Abortions TAB SAB Ect Mult Living   5 4  2 1 1    4      Review of Systems  Constitutional:       Per HPI, otherwise negative  HENT:       Per HPI, otherwise negative  Respiratory:       Per HPI, otherwise negative  Cardiovascular:        Per HPI, otherwise negative  Gastrointestinal: Positive for nausea. Negative for vomiting.  Endocrine:       Negative aside from HPI  Genitourinary:       Neg aside from HPI   Musculoskeletal:       Per HPI, otherwise negative  Skin: Negative.   Neurological: Negative for syncope.    Allergies  Statins and Ciprofloxacin hcl  Home Medications   Current Outpatient Rx  Name  Route  Sig  Dispense  Refill  . ALPRAZolam (XANAX) 1 MG tablet   Oral   Take 1 mg by mouth 2 (two) times daily as needed for anxiety.          Marland Kitchen oxyCODONE (OXY IR/ROXICODONE) 5 MG immediate release tablet   Oral   Take 5 mg by mouth every 8 (eight) hours as needed for pain. For pain          BP 142/85  Pulse 73  Temp(Src) 97.8 F (36.6 C) (Oral)  Resp 16  SpO2 99% Physical Exam  Nursing note and vitals reviewed. Constitutional: She is oriented to person, place, and time. She appears well-developed and well-nourished. No distress.  HENT:  Head: Normocephalic and atraumatic.  Eyes: Conjunctivae and EOM  are normal.  Cardiovascular: Normal rate and regular rhythm.   Pulmonary/Chest: Effort normal and breath sounds normal. No stridor. No respiratory distress.  Abdominal: She exhibits no distension. There is generalized tenderness. There is guarding. There is no rigidity and no rebound.  Musculoskeletal: She exhibits no edema.  Neurological: She is alert and oriented to person, place, and time. No cranial nerve deficit.  Skin: Skin is warm and dry.  Psychiatric: She has a normal mood and affect.    ED Course  Procedures (including critical care time) Labs Reviewed  CBC WITH DIFFERENTIAL - Abnormal; Notable for the following:    RBC 5.27 (*)    Hemoglobin 15.3 (*)    All other components within normal limits  COMPREHENSIVE METABOLIC PANEL - Abnormal; Notable for the following:    Potassium 5.7 (*)    Glucose, Bld 101 (*)    BUN 5 (*)    All other components within normal limits   LIPASE, BLOOD  URINALYSIS, ROUTINE W REFLEX MICROSCOPIC   No results found. No diagnosis found. I reviewed the patient's chart from her recent admission for abdominal pain, diagnosis of small bowel obstruction. Pulse ox 99% room air normal  MDM  Patient with history of bowel obstructions now presents with abdominal pain.  On exam she isn't tender abdomen, though no peritonitis.  Patient admitted for further evaluation and management after CT demonstrates small bowel obstruction.  Gerhard Munch, MD 04/01/13 [phone]

## 2013-03-31 NOTE — ED Notes (Signed)
Attempted to place NG tube as ordered. Patient did not tolerate well however tube was placed. Placement verified via auscultation. Patient requested that tube be removed immediately and adamantly refuses the NG tube. MD notified

## 2013-03-31 NOTE — ED Notes (Signed)
Patient transported to CT 

## 2013-03-31 NOTE — ED Notes (Signed)
Pt c/o generalized abd pain x 3 days; pt sts hx of same with SBO; pt denies N/V at present

## 2013-03-31 NOTE — ED Notes (Signed)
Patient presents with episodic midabd pain x 3 days. Started after eating a heavy meal. Has taken pain medications, stool softeners and enemas with some relief. No nausea or vomiting. No diarrhea or constipation. Is tolerating liquids and bland foods without issues. States that the episodes of pain is occuring more frequently. At its worst, pain 9/10, described as constant and sharp. Will last "just a few minutes" then goes away. No hx abdominal surgery. Notably, patient was admitted on 01/04/13 for SBO that resolved without any surgical interventions.

## 2013-03-31 NOTE — ED Notes (Signed)
Attempted floor report x 1 

## 2013-04-01 ENCOUNTER — Encounter (HOSPITAL_COMMUNITY): Payer: Self-pay | Admitting: *Deleted

## 2013-04-01 DIAGNOSIS — D0590 Unspecified type of carcinoma in situ of unspecified breast: Secondary | ICD-10-CM

## 2013-04-01 LAB — CBC
HCT: 35.3 % — ABNORMAL LOW (ref 36.0–46.0)
MCV: 86.3 fL (ref 78.0–100.0)
Platelets: 172 10*3/uL (ref 150–400)
RBC: 4.09 MIL/uL (ref 3.87–5.11)
RDW: 14.6 % (ref 11.5–15.5)
WBC: 6.7 10*3/uL (ref 4.0–10.5)

## 2013-04-01 LAB — BASIC METABOLIC PANEL
CO2: 26 mEq/L (ref 19–32)
Chloride: 107 mEq/L (ref 96–112)
GFR calc Af Amer: >90 mL/min (ref >90)
Sodium: 138 mEq/L (ref 135–145)

## 2013-04-01 MED ORDER — HYDROMORPHONE HCL PF 1 MG/ML IJ SOLN
1.0000 mg | INTRAMUSCULAR | Status: DC | PRN
  Administered 2013-04-01 – 2013-04-02 (×5): 1 mg via INTRAVENOUS
  Filled 2013-04-01 (×4): qty 1

## 2013-04-01 NOTE — Progress Notes (Signed)
Utilization review completed. Orena Cavazos RN CCM Case Mgmt phone [phone] 

## 2013-04-01 NOTE — Progress Notes (Signed)
Pt complaining of abdominal pain rated 9 out of 10;was medicated with 1 mg Dilaudid approx. 0400.  Notified Dr. Derrell Lolling.  Order placed in chart.

## 2013-04-01 NOTE — Progress Notes (Signed)
Subjective: Doing ok this morning. Abdominal pain is better, though states just had pain medication. Is passing gas. No BM yet. Notes has had this type of pain for about 10 years.  Objective: Vital signs in last 24 hours: Temp:  [97.6 F (36.4 C)-98.3 F (36.8 C)] 98.3 F (36.8 C) (07/22 0536) Pulse Rate:  [56-119] 56 (07/22 0536) Resp:  [16-18] 16 (07/22 0536) BP: (121-155)/(73-117) 121/73 mmHg (07/22 0536) SpO2:  [96 %-100 %] 99 % (07/22 0536) Weight:  [157 lb (71.215 kg)] 157 lb (71.215 kg) (07/21 2145) Last BM Date: 03/31/13  Intake/Output from previous day: 07/21 0701 - 07/22 0700 In: 826.7 [I.V.:826.7] Out: -  Intake/Output this shift:    General appearance: alert, cooperative and no distress GI: soft, mildly tender to palpation throughout, no guarding or rebound, no masses felt  Lab Results:   Recent Labs  03/31/13 1643 04/01/13 0603  WBC 8.1 6.7  HGB 15.3* 11.6*  HCT 44.8 35.3*  PLT 231 172    BMET  Recent Labs  03/31/13 1643 04/01/13 0603  NA 137 138  K 5.7* 3.5  CL 103 107  CO2 22 26  GLUCOSE 101* 116*  BUN 5* 5*  CREATININE 0.58 0.66  CALCIUM 9.3 8.5   PT/INR No results found for this basename: LABPROT, INR,  in the last 72 hours   Recent Labs Lab 03/31/13 1643  AST 35  ALT 20  ALKPHOS 61  BILITOT 0.6  PROT 7.5  ALBUMIN 4.0     Lipase     Component Value Date/Time   LIPASE 16 03/31/2013 1643     Studies/Results: Ct Abdomen Pelvis W Contrast  03/31/2013   *RADIOLOGY REPORT*  Clinical Data: Diffuse abdominal pain and tenderness.  CT ABDOMEN AND PELVIS WITH CONTRAST  Technique:  Multidetector CT imaging of the abdomen and pelvis was performed following the standard protocol during bolus administration of intravenous contrast.  Contrast: OMNIPAQUE IOHEXOL 300 MG/ML  SOLN  Comparison: CT abdomen and pelvis 01/04/2013.  Diagnostic mammogram and ultrasound 02/12/2013.  Findings: A lesion inferiorly in the left breast  demonstrates interval growth as previously documented by mammography and ultrasound.  Please see the mammogram and ultrasound reports for further discussion.  The lung bases are clear without focal nodule, mass, or airspace disease.  Heart size is normal.  No significant pleural or pericardial effusion is present.  The liver and spleen is within normal limits.  A small amount of free fluid is present.  The stomach, duodenum, and pancreas are unremarkable.  The common bile duct and gallbladder are normal. The adrenal glands are normal bilaterally.  The kidneys and ureters are unremarkable.  The rectosigmoid colon is within normal limits.  The remainder the colon is unremarkable.  The appendix is visualized and normal.  A partial distal small bowel obstruction is evident to at the same site of the previous exam.  A soft tissue mass is suggested, measuring 2.1 x 1.8 cm.  Wall thickening continues for several loops proximal to this transition point.  No significant adenopathy is present.  Moderate to valve ascites are present.  The uterus and adnexa are within normal limits for age.  The bone windows demonstrate minimal endplate degenerative changes. No focal lytic or blastic lesions are present.  IMPRESSION:  1.  Persistent or recurrent to distal partial small bowel obstruction with transition point near the terminal ileum.  There appears to be a soft tissue mass which is concerning for neoplasm. Inflammatory bowel disease is  considered less likely. 2.  Increasing abdominal ascites with layering fluid throughout the abdomen as described. 3.  Increasing size of left breast mass.  Please see the discussion and recommendation for excisional biopsy on the diagnostic mammogram and ultrasound 02/12/2013.   Original Report Authenticated By: Marin Roberts, M.D.    Medications: . pantoprazole (PROTONIX) IV  40 mg Intravenous QHS    Assessment/Plan 1. Partial SBO 2. Left Breast mass 3. Hep C 4.  Hyperlipidemia 5. Anemia  Plan: 1. Patient with partial SBO likely related to soft tissue mass near terminal ileum. Will likely plan for exploratory laparoscopy tomorrow to get a better idea of what this mass is. Dr. Magnus Ivan to evaluate for need for procedure. 2. Breast mass: patient with diagnostic mammogram and ultrasound of left breast on 02/12/13 that indicated an enlarging mass (3.0x2.8x1.6 cm) at the 5:30 position. The recommendation from this imaging study was for left breast surgical excisional biopsy. This could potentially be accomplished at the time of a procedure for her abdominal mass, though this will be determined by Dr. Magnus Ivan. 3. Continue pain control dilaudid prn. 4. Continue IV fluids until transitioned to PO intake. 5. 2 view abd film to be done tomorrow 6. Anemia likely related to dilutional effect-will follow with cbc in AM    LOS: 1 day    Marikay Alar 04/01/2013

## 2013-04-01 NOTE — Progress Notes (Signed)
I have seen and examined the patient and agree with the assessment and plans. I suspect the breast mass is a fibroadenoma although phylloides tumor can not be ruled out so it does need to be removed.  I also believe she needs a diagnostic lap, possible bowel resection regarding the intraabdominal process.  I discussed the risks of surgery with her in detail.  These include but are not limited to bleeding, infection, need for bowel resection, injury to surrounding structures, need for further surgery should malignancy be found, etc.  She agrees to proceed.  Hobart Marte A. Magnus Ivan  MD, FACS

## 2013-04-02 ENCOUNTER — Encounter (HOSPITAL_COMMUNITY): Payer: Self-pay | Admitting: Anesthesiology

## 2013-04-02 ENCOUNTER — Encounter (HOSPITAL_COMMUNITY): Admission: EM | Disposition: A | Discharge status: Home / Self Care | Payer: Self-pay | Source: Home / Self Care

## 2013-04-02 ENCOUNTER — Inpatient Hospital Stay (HOSPITAL_COMMUNITY): Payer: Medicaid Other | Admitting: Anesthesiology

## 2013-04-02 ENCOUNTER — Inpatient Hospital Stay (HOSPITAL_COMMUNITY): Payer: Medicaid Other

## 2013-04-02 DIAGNOSIS — C7A019 Malignant carcinoid tumor of the small intestine, unspecified portion: Secondary | ICD-10-CM

## 2013-04-02 HISTORY — PX: TUMOR EXCISION: SHX421

## 2013-04-02 HISTORY — PX: BREAST LUMPECTOMY: SHX2

## 2013-04-02 HISTORY — PX: LAPAROSCOPY: SHX197

## 2013-04-02 LAB — BASIC METABOLIC PANEL
BUN: 3 mg/dL — ABNORMAL LOW (ref 6–23)
CO2: 27 mEq/L (ref 19–32)
Chloride: 108 mEq/L (ref 96–112)
Creatinine, Ser: 0.64 mg/dL (ref 0.50–1.10)
GFR calc Af Amer: >90 mL/min (ref >90)
Glucose, Bld: 107 mg/dL — ABNORMAL HIGH (ref 70–99)
Potassium: 3.7 mEq/L (ref 3.5–5.1)

## 2013-04-02 LAB — CBC
HCT: 33.3 % — ABNORMAL LOW (ref 36.0–46.0)
Hemoglobin: 11 g/dL — ABNORMAL LOW (ref 12.0–15.0)
MCV: 87.2 fL (ref 78.0–100.0)
RBC: 3.82 MIL/uL — ABNORMAL LOW (ref 3.87–5.11)
RDW: 14.6 % (ref 11.5–15.5)
WBC: 6.1 10*3/uL (ref 4.0–10.5)

## 2013-04-02 SURGERY — LAPAROSCOPY, DIAGNOSTIC
Anesthesia: General | Site: Breast | Wound class: Clean Contaminated

## 2013-04-02 MED ORDER — NEOSTIGMINE METHYLSULFATE 1 MG/ML IJ SOLN
INTRAMUSCULAR | Status: DC | PRN
  Administered 2013-04-02: 3 mg via INTRAVENOUS

## 2013-04-02 MED ORDER — SODIUM CHLORIDE 0.9 % IJ SOLN: 9.0000 mL | INTRAMUSCULAR | Status: DC | PRN

## 2013-04-02 MED ORDER — ONDANSETRON HCL 4 MG/2ML IJ SOLN
4.0000 mg | Freq: Four times a day (QID) | INTRAMUSCULAR | Status: DC | PRN
  Administered 2013-04-02 – 2013-04-03 (×3): 4 mg via INTRAVENOUS
  Filled 2013-04-02 (×3): qty 2

## 2013-04-02 MED ORDER — BUPIVACAINE-EPINEPHRINE 0.25% -1:200000 IJ SOLN
INTRAMUSCULAR | Status: DC | PRN
  Administered 2013-04-02 (×2): 10 mL

## 2013-04-02 MED ORDER — FENTANYL CITRATE 0.05 MG/ML IJ SOLN
INTRAMUSCULAR | Status: DC | PRN
  Administered 2013-04-02: 150 ug via INTRAVENOUS
  Administered 2013-04-02: 100 ug via INTRAVENOUS
  Administered 2013-04-02: 50 ug via INTRAVENOUS
  Administered 2013-04-02: 100 ug via INTRAVENOUS

## 2013-04-02 MED ORDER — LIDOCAINE HCL (CARDIAC) 20 MG/ML IV SOLN
INTRAVENOUS | Status: DC | PRN
  Administered 2013-04-02: 50 mg via INTRAVENOUS

## 2013-04-02 MED ORDER — NALOXONE HCL 0.4 MG/ML IJ SOLN: 0.4000 mg | INTRAMUSCULAR | Status: DC | PRN

## 2013-04-02 MED ORDER — DIPHENHYDRAMINE HCL 50 MG/ML IJ SOLN: 12.5000 mg | Freq: Four times a day (QID) | INTRAMUSCULAR | Status: DC | PRN

## 2013-04-02 MED ORDER — ALBUTEROL SULFATE HFA 108 (90 BASE) MCG/ACT IN AERS
INHALATION_SPRAY | RESPIRATORY_TRACT | Status: DC | PRN
  Administered 2013-04-02: 2 via RESPIRATORY_TRACT

## 2013-04-02 MED ORDER — DEXTROSE-NACL 5-0.9 % IV SOLN
INTRAVENOUS | Status: DC
  Administered 2013-04-02: 20:00:00 via INTRAVENOUS
  Administered 2013-04-02: 1000 mL via INTRAVENOUS
  Administered 2013-04-03: 125 mL/h via INTRAVENOUS
  Administered 2013-04-03 – 2013-04-05 (×4): via INTRAVENOUS

## 2013-04-02 MED ORDER — PROPOFOL 10 MG/ML IV BOLUS
INTRAVENOUS | Status: DC | PRN
  Administered 2013-04-02: 170 mg via INTRAVENOUS

## 2013-04-02 MED ORDER — GLYCOPYRROLATE 0.2 MG/ML IJ SOLN
INTRAMUSCULAR | Status: DC | PRN
  Administered 2013-04-02: 0.6 mg via INTRAVENOUS

## 2013-04-02 MED ORDER — HYDROMORPHONE 0.3 MG/ML IV SOLN
INTRAVENOUS | Status: DC
  Administered 2013-04-02: 11:00:00 via INTRAVENOUS
  Administered 2013-04-02: 2 mg via INTRAVENOUS
  Administered 2013-04-03: 14:00:00 via INTRAVENOUS
  Administered 2013-04-03: 1.5 mg via INTRAVENOUS
  Administered 2013-04-03: 2.7 mg via INTRAVENOUS
  Administered 2013-04-03: 1.5 mg via INTRAVENOUS
  Administered 2013-04-03: 2.1 mg via INTRAVENOUS
  Administered 2013-04-03: 0.6 mg via INTRAVENOUS
  Administered 2013-04-04 (×2): 2.4 mg via INTRAVENOUS
  Filled 2013-04-02 (×2): qty 25

## 2013-04-02 MED ORDER — LORAZEPAM 2 MG/ML IJ SOLN
1.0000 mg | Freq: Four times a day (QID) | INTRAMUSCULAR | Status: DC | PRN
  Administered 2013-04-03 – 2013-04-06 (×9): 1 mg via INTRAVENOUS
  Filled 2013-04-02 (×9): qty 1

## 2013-04-02 MED ORDER — METOCLOPRAMIDE HCL 5 MG/ML IJ SOLN
10.0000 mg | Freq: Once | INTRAMUSCULAR | Status: AC | PRN
  Administered 2013-04-02: 10 mg via INTRAVENOUS

## 2013-04-02 MED ORDER — MIDAZOLAM HCL 5 MG/5ML IJ SOLN
INTRAMUSCULAR | Status: DC | PRN
  Administered 2013-04-02: 2 mg via INTRAVENOUS

## 2013-04-02 MED ORDER — SODIUM CHLORIDE 0.9 % IR SOLN
Status: DC | PRN
  Administered 2013-04-02 (×2): 2000 mL

## 2013-04-02 MED ORDER — HYDROMORPHONE HCL PF 1 MG/ML IJ SOLN
INTRAMUSCULAR | Status: AC
  Filled 2013-04-02: qty 1

## 2013-04-02 MED ORDER — DEXTROSE 5 % IV SOLN
2.0000 g | INTRAVENOUS | Status: DC
  Filled 2013-04-02: qty 2

## 2013-04-02 MED ORDER — CEFOXITIN SODIUM 2 G IV SOLR
2.0000 g | INTRAVENOUS | Status: DC | PRN
  Administered 2013-04-02: 2 g via INTRAVENOUS

## 2013-04-02 MED ORDER — LACTATED RINGERS IV SOLN
INTRAVENOUS | Status: DC | PRN
  Administered 2013-04-02: 08:00:00 via INTRAVENOUS

## 2013-04-02 MED ORDER — LORAZEPAM 2 MG/ML IJ SOLN
INTRAMUSCULAR | Status: AC
  Administered 2013-04-02: 1 mg via INTRAVENOUS
  Filled 2013-04-02: qty 1

## 2013-04-02 MED ORDER — LACTATED RINGERS IV SOLN
INTRAVENOUS | Status: DC
  Administered 2013-04-02: 08:00:00 via INTRAVENOUS

## 2013-04-02 MED ORDER — DIPHENHYDRAMINE HCL 12.5 MG/5ML PO ELIX: 12.5000 mg | ORAL_SOLUTION | Freq: Four times a day (QID) | ORAL | Status: DC | PRN

## 2013-04-02 MED ORDER — ROCURONIUM BROMIDE 100 MG/10ML IV SOLN
INTRAVENOUS | Status: DC | PRN
  Administered 2013-04-02: 5 mg via INTRAVENOUS
  Administered 2013-04-02: 50 mg via INTRAVENOUS

## 2013-04-02 MED ORDER — HYDROMORPHONE 0.3 MG/ML IV SOLN
INTRAVENOUS | Status: AC
  Administered 2013-04-02: 20:00:00
  Filled 2013-04-02: qty 25

## 2013-04-02 MED ORDER — ENOXAPARIN SODIUM 40 MG/0.4ML ~~LOC~~ SOLN
40.0000 mg | SUBCUTANEOUS | Status: DC
  Administered 2013-04-03 – 2013-04-06 (×4): 40 mg via SUBCUTANEOUS
  Filled 2013-04-02 (×7): qty 0.4

## 2013-04-02 MED ORDER — OXYCODONE HCL 5 MG PO TABS: 5.0000 mg | ORAL_TABLET | Freq: Once | ORAL | Status: DC | PRN

## 2013-04-02 MED ORDER — METOCLOPRAMIDE HCL 5 MG/ML IJ SOLN
INTRAMUSCULAR | Status: AC
  Filled 2013-04-02: qty 2

## 2013-04-02 MED ORDER — EPHEDRINE SULFATE 50 MG/ML IJ SOLN
INTRAMUSCULAR | Status: DC | PRN
  Administered 2013-04-02: 10 mg via INTRAVENOUS

## 2013-04-02 MED ORDER — ONDANSETRON HCL 4 MG/2ML IJ SOLN
INTRAMUSCULAR | Status: DC | PRN
  Administered 2013-04-02: 4 mg via INTRAVENOUS

## 2013-04-02 MED ORDER — HYDROMORPHONE HCL PF 1 MG/ML IJ SOLN
0.2500 mg | INTRAMUSCULAR | Status: DC | PRN
  Administered 2013-04-02 (×4): 0.5 mg via INTRAVENOUS

## 2013-04-02 MED ORDER — OXYCODONE HCL 5 MG/5ML PO SOLN: 5.0000 mg | Freq: Once | ORAL | Status: DC | PRN

## 2013-04-02 SURGICAL SUPPLY — 68 items
ADH SKN CLS APL DERMABOND .7 (GAUZE/BANDAGES/DRESSINGS) ×2
APL SKNCLS STERI-STRIP NONHPOA (GAUZE/BANDAGES/DRESSINGS) ×2
APPLIER CLIP 5 13 M/L LIGAMAX5 (MISCELLANEOUS) ×3
APR CLP MED LRG 5 ANG JAW (MISCELLANEOUS) ×2
BANDAGE ADHESIVE 1X3 (GAUZE/BANDAGES/DRESSINGS) ×6 IMPLANT
BENZOIN TINCTURE PRP APPL 2/3 (GAUZE/BANDAGES/DRESSINGS) ×3 IMPLANT
BINDER BREAST LRG (GAUZE/BANDAGES/DRESSINGS) IMPLANT
BINDER BREAST XLRG (GAUZE/BANDAGES/DRESSINGS) IMPLANT
BLADE SURG 15 STRL LF DISP TIS (BLADE) ×2 IMPLANT
BLADE SURG 15 STRL SS (BLADE) ×6
CANISTER SUCTION 2500CC (MISCELLANEOUS) ×1 IMPLANT
CHLORAPREP W/TINT 26ML (MISCELLANEOUS) ×3 IMPLANT
CLIP APPLIE 5 13 M/L LIGAMAX5 (MISCELLANEOUS) IMPLANT
CLOTH BEACON ORANGE TIMEOUT ST (SAFETY) ×3 IMPLANT
CONT SPEC 4OZ CLIKSEAL STRL BL (MISCELLANEOUS) ×1 IMPLANT
COVER SURGICAL LIGHT HANDLE (MISCELLANEOUS) ×3 IMPLANT
DECANTER SPIKE VIAL GLASS SM (MISCELLANEOUS) ×2 IMPLANT
DERMABOND ADVANCED (GAUZE/BANDAGES/DRESSINGS) ×1
DERMABOND ADVANCED .7 DNX12 (GAUZE/BANDAGES/DRESSINGS) ×2 IMPLANT
DEVICE DUBIN SPECIMEN MAMMOGRA (MISCELLANEOUS) IMPLANT
DRAPE C-ARM 42X72 X-RAY (DRAPES) IMPLANT
DRAPE PED LAPAROTOMY (DRAPES) ×3 IMPLANT
DRSG MEPILEX BORDER 4X8 (GAUZE/BANDAGES/DRESSINGS) ×1 IMPLANT
DRSG TEGADERM 4X4.75 (GAUZE/BANDAGES/DRESSINGS) ×1 IMPLANT
ELECT CAUTERY BLADE 6.4 (BLADE) ×3 IMPLANT
ELECT REM PT RETURN 9FT ADLT (ELECTROSURGICAL) ×3
ELECTRODE REM PT RTRN 9FT ADLT (ELECTROSURGICAL) ×2 IMPLANT
GAUZE SPONGE 2X2 8PLY STRL LF (GAUZE/BANDAGES/DRESSINGS) IMPLANT
GAUZE SPONGE 4X4 16PLY XRAY LF (GAUZE/BANDAGES/DRESSINGS) ×3 IMPLANT
GLOVE SURG SIGNA 7.5 PF LTX (GLOVE) ×4 IMPLANT
GOWN STRL NON-REIN LRG LVL3 (GOWN DISPOSABLE) ×5 IMPLANT
GOWN STRL REIN XL XLG (GOWN DISPOSABLE) ×4 IMPLANT
KIT BASIN OR (CUSTOM PROCEDURE TRAY) ×3 IMPLANT
KIT MARKER MARGIN INK (KITS) IMPLANT
KIT ROOM TURNOVER OR (KITS) ×3 IMPLANT
LIGASURE IMPACT 36 18CM CVD LR (INSTRUMENTS) ×1 IMPLANT
NDL HYPO 25GX1X1/2 BEV (NEEDLE) ×2 IMPLANT
NEEDLE HYPO 25GX1X1/2 BEV (NEEDLE) ×3 IMPLANT
NS IRRIG 1000ML POUR BTL (IV SOLUTION) ×6 IMPLANT
PACK SURGICAL SETUP 50X90 (CUSTOM PROCEDURE TRAY) ×3 IMPLANT
PAD ARMBOARD 7.5X6 YLW CONV (MISCELLANEOUS) ×4 IMPLANT
PENCIL BUTTON HOLSTER BLD 10FT (ELECTRODE) ×3 IMPLANT
RELOAD PROXIMATE 75MM BLUE (ENDOMECHANICALS) ×6 IMPLANT
RELOAD STAPLE 75 3.8 BLU REG (ENDOMECHANICALS) IMPLANT
SCISSORS LAP 5X35 DISP (ENDOMECHANICALS) ×1 IMPLANT
SET IRRIG TUBING LAPAROSCOPIC (IRRIGATION / IRRIGATOR) ×1 IMPLANT
SLEEVE ENDOPATH XCEL 5M (ENDOMECHANICALS) ×3 IMPLANT
SPONGE GAUZE 2X2 STER 10/PKG (GAUZE/BANDAGES/DRESSINGS) ×1
STAPLER GUN LINEAR PROX 60 (STAPLE) ×1 IMPLANT
STAPLER PROXIMATE 75MM BLUE (STAPLE) ×1 IMPLANT
STAPLER VISISTAT 35W (STAPLE) ×1 IMPLANT
SUCTION POOLE TIP (SUCTIONS) ×1 IMPLANT
SUT MNCRL AB 4-0 PS2 18 (SUTURE) ×4 IMPLANT
SUT MON AB 4-0 PC3 18 (SUTURE) ×3 IMPLANT
SUT PDS AB 1 TP1 96 (SUTURE) ×1 IMPLANT
SUT SILK 3 0 SH CR/8 (SUTURE) ×2 IMPLANT
SUT VIC AB 3-0 SH 27 (SUTURE) ×3
SUT VIC AB 3-0 SH 27X BRD (SUTURE) ×2 IMPLANT
SYR BULB 3OZ (MISCELLANEOUS) ×3 IMPLANT
SYR CONTROL 10ML LL (SYRINGE) ×3 IMPLANT
TOWEL OR 17X24 6PK STRL BLUE (TOWEL DISPOSABLE) ×3 IMPLANT
TOWEL OR 17X26 10 PK STRL BLUE (TOWEL DISPOSABLE) ×3 IMPLANT
TRAY LAPAROSCOPIC (CUSTOM PROCEDURE TRAY) ×3 IMPLANT
TROCAR XCEL BLUNT TIP 100MML (ENDOMECHANICALS) ×1 IMPLANT
TROCAR XCEL NON-BLD 11X100MML (ENDOMECHANICALS) IMPLANT
TROCAR XCEL NON-BLD 5MMX100MML (ENDOMECHANICALS) ×3 IMPLANT
TUBE CONNECTING 12X1/4 (SUCTIONS) ×1 IMPLANT
YANKAUER SUCT BULB TIP NO VENT (SUCTIONS) ×2 IMPLANT

## 2013-04-02 NOTE — Anesthesia Preprocedure Evaluation (Signed)
Anesthesia Evaluation  Patient identified by MRN, date of birth, ID band Patient awake    Reviewed: Allergy & Precautions, H&P , NPO status , Patient's Chart, lab work & pertinent test results, reviewed documented beta blocker date and time   Airway Mallampati: II TM Distance: >3 FB Neck ROM: full    Dental   Pulmonary neg pulmonary ROS,  breath sounds clear to auscultation        Cardiovascular + Peripheral Vascular Disease negative cardio ROS  Rhythm:regular     Neuro/Psych PSYCHIATRIC DISORDERS Anxiety Depression TIAnegative neurological ROS  negative psych ROS   GI/Hepatic negative GI ROS, Neg liver ROS, (+) Hepatitis -, C  Endo/Other  negative endocrine ROS  Renal/GU negative Renal ROS  negative genitourinary   Musculoskeletal   Abdominal   Peds  Hematology negative hematology ROS (+)   Anesthesia Other Findings See surgeon's H&P   Reproductive/Obstetrics negative OB ROS                           Anesthesia Physical Anesthesia Plan  ASA: III  Anesthesia Plan: General   Post-op Pain Management:    Induction: Intravenous  Airway Management Planned: Oral ETT  Additional Equipment:   Intra-op Plan:   Post-operative Plan: Extubation in OR  Informed Consent: I have reviewed the patients History and Physical, chart, labs and discussed the procedure including the risks, benefits and alternatives for the proposed anesthesia with the patient or authorized representative who has indicated his/her understanding and acceptance.   Dental Advisory Given  Plan Discussed with: CRNA and Surgeon  Anesthesia Plan Comments:         Anesthesia Quick Evaluation

## 2013-04-02 NOTE — Preoperative (Signed)
Beta Blockers   Reason not to administer Beta Blockers:Not Applicable 

## 2013-04-02 NOTE — Op Note (Signed)
Diaganostic Lap.; Small Bowel Resection, Excision  of Left Breast Mass  Procedure Note  [name] 03/31/2013 - 04/02/2013   Pre-op Diagnosis: Small Bowel Obstruction Left Breast Mass     Post-op Diagnosis: same with small bowel mass  Procedure(s): Diaganostic Lap.; Small Bowel Resection Excision  of Left Breast Mass  Surgeon(s): Shelly Rubenstein, MD  Anesthesia: General  Staff:  Circulator: Patrica Duel Scrub Person: Eliane Decree, RN; Armanda Magic Key  Estimated Blood Loss: Minimal               Specimens: sent to path          [facility]   Date: 04/02/2013  Time: 10:03 AM

## 2013-04-02 NOTE — Anesthesia Postprocedure Evaluation (Signed)
Anesthesia Post Note  Patient: [name]  Procedure(s) Performed: Procedure(s) (LRB): Diaganostic Lap.; Small Bowel Resection (N/A) Excision  of Left Breast Mass (Left)  Anesthesia type: General  Patient location: PACU  Post pain: Pain level controlled  Post assessment: Patient's Cardiovascular Status Stable  Last Vitals:  Filed Vitals:   04/02/13 1227  BP: 147/82  Pulse: 53  Temp: 36.7 C  Resp: 16    Post vital signs: Reviewed and stable  Level of consciousness: alert  Complications: No apparent anesthesia complications

## 2013-04-02 NOTE — Op Note (Signed)
NAMEHANNA, [name]                ACCOUNT NO.:  [id number]  MEDICAL RECORD NO.:  [id number]  LOCATION:  [facility]                         FACILITY:  [facility]  PHYSICIAN:  Abigail Miyamoto, M.D. DATE OF BIRTH:  September 23, 1955  DATE OF PROCEDURE:  04/02/2013 DATE OF DISCHARGE:                              OPERATIVE REPORT   PREOPERATIVE DIAGNOSES: 1. Small bowel obstruction. 2. Left breast mass.  POSTOPERATIVE DIAGNOSES: 1. Small bowel obstruction. 2. Left breast mass.  PROCEDURE: 1. Excision of left breast mass. 2. Diagnostic laparoscopy. 3. Small bowel resection.  SURGEON:  Abigail Miyamoto, MD  ANESTHESIA:  General and 0.25% Marcaine.  ESTIMATED BLOOD LOSS:  Minimal.  INDICATIONS:  This is a 57 year old female with a known history of fibroadenoma of the left breast, which is getting larger.  She has also been presenting with intermittent small bowel obstructions.  Originally in April she had a CAT scan of the abdomen and pelvis which was fairly unremarkable.  She had a small-bowel follow-through which is unremarkable as well.  She now presents again with a small bowel obstruction and this time the CAT scan demonstrated a possible mass in the small intestines.  FINDINGS:  The left breast mass appeared consistent with a fibroadenoma. There was a obstructing mass in the lumen of the distal small bowel.  PROCEDURE IN DETAIL:  The patient was brought to the operating room, identified as Ms. [name].  She was placed supine on the operating table and general anesthesia was induced.  Her breast and abdomen were then prepped and draped in usual sterile fashion.  I made a circumareolar incision at the lower edge of the areola of the left breast.  I took this down to the breast tissue with electrocautery.  The mass was easily identified and elevated and appeared consistent with a fibroadenoma.  It was completely excised with the electrocautery.  It was then sent to Pathology for  evaluation.  Hemostasis was achieved with the cautery.  I anesthetized the wound with Marcaine.  I then closed subcutaneous tissue with interrupted 3-0 Vicryl sutures and closed the skin with a running 4- 0 Monocryl.  Steri-Strips, gauze, and Tegaderm were then applied.  I then turned my attention to the abdominal portion of procedure.  I made a small incision just below the umbilicus with scalpel.  I took this down to fascia which was opened with scalpel.  A hemostat was then used to pass through the peritoneal cavity under direct vision.  A 0 Vicryl pursestring suture was then placed around the fascial opening. The Hasson port was placed through the opening and insufflation of the abdomen was begun.  A 5 mm port was placed above the umbilicus and another in the lower midline both under direct vision.  I evaluated the distal small bowel and colon.  The patient had a mass that appeared to be obstructing the lumen of the small bowel, approximately 1-1/2 feet from the terminal ileum.  The rest of the intra-abdominal contents appeared normal.  At this point, I converted to an open procedure.  I removed all ports and deflated the abdomen.  I then created an incision between the Hasson port in the lower  midline port with a scalpel.  I took this down to the fascia which was opened with the electrocautery. Upon entering the abdomen, there was a small serosal injury to the small intestines, which I closed with a silk suture.  I then eviscerated the small intestines to identify the mass.  Again it was approximately 1-1/2 feet from the distal ileum.  There was some mild shotty adenopathy in the mesentery around the mass.  I transected the small intestine proximal and distal to the mass with GIA 75 stapler.  I then took down the mesentery with the ligature and 3-0 silk sutures.  The specimen was then sent to Pathology for evaluation.  I eviscerated the rest of the proximal small intestines and found  no other pathology.  I then reapproximated the small bowel proximally and distally in a side-to-side fashion with silk sutures.  I created 2 enterotomies and performed a side-to-side anastomosis with a single firing of the GIA 75 stapler. The open end was then closed with a TA 60 stapler.  I reinforced staple line with interrupted silk sutures.  I also closed the mesenteric defect with silk sutures as well.  A wide anastomosis which appeared pink and viable appeared to be achieved.  I then placed this back into the abdominal cavity and irrigated the abdomen with a liter of normal saline.  Hemostasis appeared to be achieved.  I then closed the patient's midline fascia with a running looped #1 PDS suture.  The skin was then irrigated, anesthetized with Marcaine, and closed with staples. Gauze and tape were then applied.  The patient tolerated the procedure well.  All counts were correct at the end of procedure.  The patient was then extubated in operating room and taken in stable condition to the recovery room.     Abigail Miyamoto, M.D.     DB/MEDQ  D:  04/02/2013  T:  04/02/2013  Job:  [id number]

## 2013-04-02 NOTE — Anesthesia Procedure Notes (Signed)
Procedure Name: Intubation Date/Time: 04/02/2013 8:42 AM Performed by: Gwenyth Allegra Pre-anesthesia Checklist: Emergency Drugs available, Patient identified, Timeout performed, Suction available and Patient being monitored Patient Re-evaluated:Patient Re-evaluated prior to inductionOxygen Delivery Method: Circle system utilized Preoxygenation: Pre-oxygenation with 100% oxygen Intubation Type: IV induction Ventilation: Mask ventilation without difficulty Laryngoscope Size: Mac Grade View: Grade II Tube type: Oral Tube size: 7.0 mm Number of attempts: 1 Airway Equipment and Method: Stylet Secured at: 21 cm Tube secured with: Tape Dental Injury: Teeth and Oropharynx as per pre-operative assessment

## 2013-04-02 NOTE — Transfer of Care (Signed)
Immediate Anesthesia Transfer of Care Note  Patient: [name]  Procedure(s) Performed: Procedure(s): Diaganostic Lap.; Small Bowel Resection (N/A) Excision  of Left Breast Mass (Left)  Patient Location: PACU  Anesthesia Type:General  Level of Consciousness: awake, alert  and oriented  Airway & Oxygen Therapy: Patient Spontanous Breathing and Patient connected to nasal cannula oxygen  Post-op Assessment: Report given to PACU RN and Post -op Vital signs reviewed and stable  Post vital signs: Reviewed and stable  Complications: No apparent anesthesia complications

## 2013-04-03 ENCOUNTER — Encounter (HOSPITAL_COMMUNITY): Payer: Self-pay | Admitting: Surgery

## 2013-04-03 LAB — CBC
MCH: 28.7 pg (ref 26.0–34.0)
MCV: 85.8 fL (ref 78.0–100.0)
Platelets: 183 10*3/uL (ref 150–400)
RDW: 14.3 % (ref 11.5–15.5)
WBC: 11.3 10*3/uL — ABNORMAL HIGH (ref 4.0–10.5)

## 2013-04-03 LAB — BASIC METABOLIC PANEL
Calcium: 8.7 mg/dL (ref 8.4–10.5)
Creatinine, Ser: 0.56 mg/dL (ref 0.50–1.10)
GFR calc Af Amer: >90 mL/min (ref >90)
GFR calc non Af Amer: >90 mL/min (ref >90)
Sodium: 137 mEq/L (ref 135–145)

## 2013-04-03 NOTE — Progress Notes (Signed)
Md on call notified for portable chest xray to verify placement of NGT. Ilean Skill LPN

## 2013-04-03 NOTE — Progress Notes (Signed)
1 Day Post-Op  Subjective: Complains of some pain this morning. States PCA helps with this. Is not enjoying the NG tube.  Objective: Vital signs in last 24 hours: Temp:  [97.6 F (36.4 C)-98.6 F (37 C)] 98.6 F (37 C) (07/24 0454) Pulse Rate:  [47-72] 64 (07/24 0623) Resp:  [1-25] 17 (07/24 0623) BP: (138-173)/(69-87) 154/69 mmHg (07/24 0623) SpO2:  [93 %-100 %] 99 % (07/24 0623) FiO2 (%):  [100 %] 100 % (07/24 0415) Last BM Date: 04/01/13  Intake/Output from previous day: 07/23 0701 - 07/24 0700 In: 3385.4 [I.V.:3385.4] Out: 5325 [Urine:5125; Emesis/NG output:200] Intake/Output this shift:    General appearance: alert, cooperative and no distress Breasts: bandage in place and appears c/d/i, small area of erythema below bandage without warmth, induration, or fluctuance GI: soft, diffusely tender to palpation, no guarding or rebound, minimal bowel sounds  Lab Results:   Recent Labs  04/02/13 0445 04/03/13 0530  WBC 6.1 11.3*  HGB 11.0* 11.9*  HCT 33.3* 35.6*  PLT 165 183    BMET  Recent Labs  04/02/13 0445 04/03/13 0530  NA 141 137  K 3.7 3.7  CL 108 103  CO2 27 27  GLUCOSE 107* 141*  BUN 3* 3*  CREATININE 0.64 0.56  CALCIUM 8.8 8.7   PT/INR No results found for this basename: LABPROT, INR,  in the last 72 hours   Recent Labs Lab 03/31/13 1643  AST 35  ALT 20  ALKPHOS 61  BILITOT 0.6  PROT 7.5  ALBUMIN 4.0     Lipase     Component Value Date/Time   LIPASE 16 03/31/2013 1643     Studies/Results: Dg Chest Port 1 View  04/02/2013   *RADIOLOGY REPORT*  Clinical Data: Small bowel obstruction. Verify nasogastric tube placement  PORTABLE CHEST - 1 VIEW  Comparison: January 04, 2013  Findings: Nasogastric tube is identified with distal tip in the proximal stomach.  There is no small bowel obstruction or diverticulitis.  Mediastinal contour and cardiac silhouette are stable.  The soft tissues and osseous structures are stable.  IMPRESSION: Nasogastric  tube is identified with distal tip in the proximal stomach.   Original Report Authenticated By: Sherian Rein, M.D.   Dg Abd Portable 2v  04/02/2013   *RADIOLOGY REPORT*  Clinical Data: Partial small bowel obstruction.  PORTABLE ABDOMEN - 2 VIEW  Comparison: Radiograph of January 07, 2013; CT scan of March 31, 2013.  Findings: Residual contrast is seen in the distal colon.  Mildly dilated loops of small bowel seen in the central portion of the abdomen consistent with the partial small bowel obstruction noted on prior study.  No abnormal calcifications are noted.  No osseous abnormality is noted.  IMPRESSION: Mildly dilated loops of small bowel seen in central portion of the abdomen consistent with partial small bowel obstruction seen on prior CT scan.   Original Report Authenticated By: Lupita Raider.,  M.D.    Medications: . enoxaparin (LOVENOX) injection  40 mg Subcutaneous Q24H  . HYDROmorphone PCA 0.3 mg/mL   Intravenous Q4H  . pantoprazole (PROTONIX) IV  40 mg Intravenous QHS    Assessment/Plan 1. Partial SBO s/p diagnostic lap with small bowel resection 2. Left Breast mass s/p excision of breast mass 3. Hep C 4. Hyperlipidemia 5. Anemia 6. Leukocytosis  Plan: 1. Will continue with NG tube and NPO as patient continues to have abdominal pain in post-surgical setting. Advised to mobilize today.  2. Continue pain management with dilaudid PCA 3.  Will await pathology results for diagnosis of small bowel mass. 4. 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 Leukocytosis likely related to stress reaction from surgery, will follow with CBC tomorrow 6. Will follow erythema of left breast, if spreads further consider cellulitis and may need antibiotics    LOS: 3 days    Marikay Alar 04/03/2013

## 2013-04-03 NOTE — Progress Notes (Signed)
I have seen and examined the patient and agree with the assessment and plans. Awaiting path  Charnese Federici A. Chanika Byland  MD, FACS 

## 2013-04-04 LAB — CBC
Platelets: 178 10*3/uL (ref 150–400)
RBC: 3.79 MIL/uL — ABNORMAL LOW (ref 3.87–5.11)
RDW: 14.3 % (ref 11.5–15.5)
WBC: 8.4 10*3/uL (ref 4.0–10.5)

## 2013-04-04 LAB — BASIC METABOLIC PANEL
Chloride: 103 mEq/L (ref 96–112)
Creatinine, Ser: 0.57 mg/dL (ref 0.50–1.10)
GFR calc Af Amer: >90 mL/min (ref >90)
Potassium: 3.1 mEq/L — ABNORMAL LOW (ref 3.5–5.1)
Sodium: 140 mEq/L (ref 135–145)

## 2013-04-04 MED ORDER — POTASSIUM CHLORIDE 10 MEQ/100ML IV SOLN
10.0000 meq | INTRAVENOUS | Status: AC
  Administered 2013-04-04 (×4): 10 meq via INTRAVENOUS
  Filled 2013-04-04: qty 100
  Filled 2013-04-04: qty 300
  Filled 2013-04-04: qty 100

## 2013-04-04 MED ORDER — HYDROMORPHONE HCL PF 1 MG/ML IJ SOLN
1.0000 mg | INTRAMUSCULAR | Status: DC | PRN
  Administered 2013-04-04 – 2013-04-07 (×7): 1 mg via INTRAVENOUS
  Filled 2013-04-04 (×7): qty 1

## 2013-04-04 NOTE — Progress Notes (Signed)
2 Days Post-Op  Subjective: Doing well. Pain is improved though still present. No gas or BM yet. Burping a lot.   Objective: Vital signs in last 24 hours: Temp:  [88.1 F (31.2 C)-98.8 F (37.1 C)] 98.5 F (36.9 C) (07/25 0611) Pulse Rate:  [57-73] 73 (07/25 0611) Resp:  [16-22] 17 (07/25 0611) BP: (127-142)/(54-83) 140/68 mmHg (07/25 0611) SpO2:  [95 %-100 %] 96 % (07/25 0611) Last BM Date: 04/01/13  Intake/Output from previous day: 07/24 0701 - 07/25 0700 In: 0  Out: 2900 [Urine:1900; Emesis/NG output:1000] Intake/Output this shift:    General appearance: alert, cooperative and no distress Breasts: incision c/d/i, erythema below area of previous bandage unchanged GI: soft, minimally tender to palpation, no guarding or rebound, minimal bowel sounds, midline incision with staples c/d/i  Lab Results:   Recent Labs  04/03/13 0530 04/04/13 0500  WBC 11.3* 8.4  HGB 11.9* 10.8*  HCT 35.6* 32.8*  PLT 183 178    BMET  Recent Labs  04/03/13 0530 04/04/13 0500  NA 137 140  K 3.7 3.1*  CL 103 103  CO2 27 26  GLUCOSE 141* 112*  BUN 3* 3*  CREATININE 0.56 0.57  CALCIUM 8.7 8.9   PT/INR No results found for this basename: LABPROT, INR,  in the last 72 hours   Recent Labs Lab 03/31/13 1643  AST 35  ALT 20  ALKPHOS 61  BILITOT 0.6  PROT 7.5  ALBUMIN 4.0     Lipase     Component Value Date/Time   LIPASE 16 03/31/2013 1643     Studies/Results: Dg Chest Port 1 View  04/02/2013   *RADIOLOGY REPORT*  Clinical Data: Small bowel obstruction. Verify nasogastric tube placement  PORTABLE CHEST - 1 VIEW  Comparison: January 04, 2013  Findings: Nasogastric tube is identified with distal tip in the proximal stomach.  There is no small bowel obstruction or diverticulitis.  Mediastinal contour and cardiac silhouette are stable.  The soft tissues and osseous structures are stable.  IMPRESSION: Nasogastric tube is identified with distal tip in the proximal stomach.    Original Report Authenticated By: Sherian Rein, M.D.    Medications: . enoxaparin (LOVENOX) injection  40 mg Subcutaneous Q24H  . HYDROmorphone PCA 0.3 mg/mL   Intravenous Q4H  . pantoprazole (PROTONIX) IV  40 mg Intravenous QHS    Assessment/Plan 1. Partial SBO s/p diagnostic lap with small bowel resection 2. Left Breast mass s/p excision of breast mass 3. Hep C 4. Hyperlipidemia 5. Anemia 6. Hypokalemia  Plan: 1. Will continue with NG tube and NPO as patient continues to have minimal bowel sounds and is not passing gas or having BM yet. Continue to encourage mobilization. Once bowel sounds increase could consider clamping trial of NG. 2. Will d/c pca and transition to dilaudid injection 3. Will await pathology results for diagnosis of small bowel mass. 4. 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 Will replete potassium    LOS: 4 days    Marikay Alar 04/04/2013

## 2013-04-04 NOTE — Progress Notes (Signed)
I have seen and examined the patient and agree with the assessment and plans. Awaiting path  Riley Lam A. Magnus Ivan  MD, FACS

## 2013-04-05 MED ORDER — DEXTROSE-NACL 5-0.9 % IV SOLN: INTRAVENOUS | Status: DC

## 2013-04-05 MED ORDER — POTASSIUM CHLORIDE 2 MEQ/ML IV SOLN
INTRAVENOUS | Status: DC
  Administered 2013-04-05 – 2013-04-06 (×2): via INTRAVENOUS
  Filled 2013-04-05 (×5): qty 1000

## 2013-04-05 NOTE — Progress Notes (Signed)
3 Days Post-Op  Subjective: Feels better. Passed some flatus. No nausea. Ambulating. NG drainage thinner.  Objective: Vital signs in last 24 hours: Temp:  [98.1 F (36.7 C)-99.3 F (37.4 C)] 98.9 F (37.2 C) (07/26 0600) Pulse Rate:  [69-78] 69 (07/26 0600) Resp:  [17-20] 18 (07/26 0600) BP: (135-143)/(68-83) 140/71 mmHg (07/26 0600) SpO2:  [96 %-97 %] 97 % (07/26 0600) Last BM Date: 04/01/13  Intake/Output from previous day: 07/25 0701 - 07/26 0700 In: 0  Out: 3625 [Urine:2775; Emesis/NG output:850] Intake/Output this shift:    General appearance: alert. Mental status normal. No distress GI: soft. Not distended. Hypoactive bowel sounds. Wound clean.  Lab Results:  No results found for this or any previous visit (from the past 24 hour(s)).   Studies/Results: @RISRSLT24 @  . enoxaparin (LOVENOX) injection  40 mg Subcutaneous Q24H  . pantoprazole (PROTONIX) IV  40 mg Intravenous QHS     Assessment/Plan: s/p Procedure(s): Diaganostic Lap.; Small Bowel Resection Excision  of Left Breast Mass  POD#3. Small bowel resection and left breast mass excision Ileus resolving. Discontinue NG tube. Start clear liquids.  Hypokalemia. (3.1)Add Kay Ciel to IV fluids  Check pathology  @PROBHOSP @  LOS: 5 days    Anet Logsdon M 04/05/2013  . .prob

## 2013-04-06 MED ORDER — PANTOPRAZOLE SODIUM 40 MG PO TBEC
40.0000 mg | DELAYED_RELEASE_TABLET | Freq: Every day | ORAL | Status: DC
  Administered 2013-04-06 – 2013-04-07 (×2): 40 mg via ORAL
  Filled 2013-04-06 (×2): qty 1

## 2013-04-06 MED ORDER — HYDROCODONE-ACETAMINOPHEN 5-325 MG PO TABS
1.0000 | ORAL_TABLET | ORAL | Status: DC | PRN
  Administered 2013-04-07: 2 via ORAL
  Filled 2013-04-06: qty 2

## 2013-04-06 NOTE — Progress Notes (Signed)
4 Days Post-Op  Subjective: Feeling better. Had a bowel movement. Tolerating clear liquids. Wants regular food.Pain control very good.  Pathology report reviewed with patient by Dr. 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 I told her that she would need further breast workup including MRI and Pott and involvement with medical oncology at a later date and Dr. Rayburn Ma which supervised that. She understands as well.  Objective: Vital signs in last 24 hours: Temp:  [98.4 F (36.9 C)-99.7 F (37.6 C)] 98.4 F (36.9 C) (07/27 0550) Pulse Rate:  [61-68] 61 (07/27 0550) Resp:  [15-20] 16 (07/27 0550) BP: (125-158)/(76-78) 158/78 mmHg (07/27 0550) SpO2:  [96 %-99 %] 99 % (07/27 0550) Last BM Date: 04/06/13  Intake/Output from previous day: 07/26 0701 - 07/27 0700 In: 1413 [P.O.:120; I.V.:1293] Out: -  Intake/Output this shift:    General appearance: alert. Pleasant. In good spirits. No distress. GI: abdomen soft. Minimally tender. Not distended. Bowel sounds present. Wound clean.  Lab Results:  No results found for this or any previous visit (from the past 24 hour(s)).   Studies/Results: @RISRSLT24 @  . enoxaparin (LOVENOX) injection  40 mg Subcutaneous Q24H  . pantoprazole  40 mg Oral Daily     Assessment/Plan: s/p Procedure(s): Diaganostic Lap.; Small Bowel Resection Excision  of Left Breast Mass   POD #4. Small bowel resection for stage II carcinoid, and left breast excision for fibroadenoma and low-grade DCIS. Pathology discussed with patient Ileus resolving. Advanced to low-residue diet  Oral analgesics Possible discharge home tomorrow.  Dr. Carman Ching will arrange followup care regarding her breast neoplasia and her abdominal neoplasia.  @PROBHOSP @  LOS: 6 days    Macguire Holsinger  M 04/06/2013  . .prob

## 2013-04-07 ENCOUNTER — Encounter (INDEPENDENT_AMBULATORY_CARE_PROVIDER_SITE_OTHER): Payer: Medicaid Other | Admitting: Surgery

## 2013-04-07 DIAGNOSIS — K56600 Partial intestinal obstruction, unspecified as to cause: Secondary | ICD-10-CM | POA: Diagnosis present

## 2013-04-07 DIAGNOSIS — D0512 Intraductal carcinoma in situ of left breast: Secondary | ICD-10-CM

## 2013-04-07 MED ORDER — HYDROCODONE-ACETAMINOPHEN 5-325 MG PO TABS: 1.0000 | ORAL_TABLET | ORAL | Status: DC | PRN

## 2013-04-07 NOTE — Discharge Summary (Signed)
Physician Discharge Summary  [name]:[id number] DOB: Jun 20, 1956 DOA: 03/31/2013  PCP: [name], MD  Consultation: none  Admit date: 03/31/2013 Discharge date: 04/07/2013  Recommendations for Outpatient Follow-up:    Follow-up Information   Follow up with [facility] On 04/10/2013. (this is a nurse visit to have your staples removed.  Appointment time: 10AM)    Contact information:   [address] [place] [address] [phone]      Follow up with Shelly Rubenstein, MD On 04/22/2013. (APPOINTMENT TIME: 12PM. THIS IS AN APPOINTMENT WITH YOUR BREAST AND ABDOMINAL SURGEON)    Contact information:   [address] [place] [address] [phone]      Discharge Diagnoses:  1. 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 Magnus Ivan 04/02/13)  Discharge Condition: stable Disposition: home  Diet recommendation: regular  Filed Weights   03/31/13 2145  Weight: 157 lb (71.215 kg)    Filed Vitals:   04/07/13 0539  BP: 141/67  Pulse: 59  Temp: 98.1 F (36.7 C)  Resp: 18    Hospital Course:  [name] is a 57 year old female with a history of depression, hepatitis C, TIA, SBO in April of 2014 admitted with recurrent SBO secondary to a small bowel mass.  She also had a new patient evaluation for a breast mass with Dr. Magnus Ivan scheduled today.  Upon admission she was started on IVF, NPO.  She underwent a small bowel resection on 04/02/13 as well as resection of the breast mass.  She was treated with NGT and PCA post operatively.  She began to feel better, having bms and diet was advanced.  Her pathology showed stage II cancer of small bowel and DCIS of left breast.  This was discussed with the patient.  Follow up with Dr. Magnus Ivan has been set up on behalf of the patient which she is  agreeable with.  She had hypokalemia which was supplemented.  She had mild anemia which was monitored, but did not require treatment.  POD #5 her pain was under good control having bm's and ambulating in hallways.  General appearance: alert and oriented. Calm and cooperative No acute distress. VSS. Afebrile.  Resp: clear to auscultation bilaterally  Cardio: S1S1 RRR without murmurs or gallops. No edema. GI: soft round and nontender. +BS x4 quadrants. No organomegaly, hernias or masses. Midline incision-dressing removed, edges are approximated, staples are intact, there is no erythema or drainage.   Pulses: +2 bilateral distal pulses without cyanosis  Neurologic: thought content appropriate  Breast: left breast inferior to nipple-no drainage, steri strips are intact  Discharge Instructions   Future Appointments Provider Department Dept Phone   04/10/2013 10:00 AM [facility] Nurse [facility] [place] [phone]   04/22/2013 12:15 PM Shelly Rubenstein, MD [facility], [place] [phone]       Medication List    STOP taking these medications       oxyCODONE 5 MG immediate release tablet  Commonly known as:  Oxy IR/ROXICODONE      TAKE these medications       ALPRAZolam 1 MG tablet  Commonly known as:  XANAX  Take 1 mg by mouth 2 (two) times daily as needed for anxiety.     HYDROcodone-acetaminophen 5-325 MG per tablet  Commonly known as:  NORCO/VICODIN  Take 1 tablet by mouth every 4 (four) hours as needed.           Follow-up Information   Follow up with [facility] On 04/10/2013. (this is a nurse visit to have your staples removed.  Appointment time: 10AM)    Contact information:   [address] [place] [address] [phone]      Follow up with Shelly Rubenstein, MD On 04/22/2013. (APPOINTMENT TIME: 12PM. THIS IS AN APPOINTMENT WITH YOUR BREAST AND ABDOMINAL SURGEON)    Contact information:   [address] [place] [address] [phone]        The results of significant diagnostics from this hospitalization (including imaging, microbiology, ancillary and laboratory) are listed below for reference.    Significant Diagnostic Studies: Ct Abdomen Pelvis W Contrast  03/31/2013   *RADIOLOGY REPORT*  Clinical Data: Diffuse abdominal pain and tenderness.  CT ABDOMEN AND PELVIS WITH CONTRAST  Technique:  Multidetector CT imaging of the abdomen and pelvis was performed following the standard protocol during bolus administration of intravenous contrast.  Contrast: OMNIPAQUE IOHEXOL 300 MG/ML  SOLN  Comparison: CT abdomen and pelvis 01/04/2013.  Diagnostic mammogram and ultrasound 02/12/2013.  Findings: A lesion inferiorly in the left breast demonstrates interval growth as previously documented by mammography and ultrasound.  Please see the mammogram and ultrasound reports for further discussion.  The lung bases are clear without focal nodule, mass, or airspace disease.  Heart size is normal.  No significant pleural or pericardial effusion is present.  The liver and spleen is within normal limits.  A small amount of free fluid is present.  The stomach, duodenum, and pancreas are unremarkable.  The common bile duct and gallbladder are normal. The adrenal glands are normal bilaterally.  The kidneys and ureters are unremarkable.  The rectosigmoid colon is within normal limits.  The remainder the colon is unremarkable.  The appendix is visualized and normal.  A partial distal small bowel obstruction is evident to at the same site of the previous exam.  A soft tissue mass is suggested, measuring 2.1 x 1.8 cm.  Wall thickening continues for several loops proximal to this transition point.  No significant adenopathy is present.  Moderate to valve ascites are present.  The uterus and adnexa are within normal limits for age.  The bone windows demonstrate minimal endplate degenerative changes. No  focal lytic or blastic lesions are present.  IMPRESSION:  1.  Persistent or recurrent to distal partial small bowel obstruction with transition point near the terminal ileum.  There appears to be a soft tissue mass which is concerning for neoplasm. Inflammatory bowel disease is considered less likely. 2.  Increasing abdominal ascites with layering fluid throughout the abdomen as described. 3.  Increasing size of left breast mass.  Please see the discussion and recommendation for excisional biopsy on the diagnostic mammogram and ultrasound 02/12/2013.   Original Report Authenticated By: Marin Roberts, M.D.   Dg Chest Port 1 View  04/02/2013   *RADIOLOGY REPORT*  Clinical Data: Small bowel obstruction. Verify nasogastric tube placement  PORTABLE CHEST - 1 VIEW  Comparison: January 04, 2013  Findings: Nasogastric tube is identified with distal tip in the proximal stomach.  There is no small bowel obstruction or diverticulitis.  Mediastinal contour and cardiac silhouette are stable.  The soft tissues and osseous structures are stable.  IMPRESSION: Nasogastric tube is identified with distal tip in the proximal stomach.  Original Report Authenticated By: Sherian Rein, M.D.   Dg Abd Portable 2v  04/02/2013   *RADIOLOGY REPORT*  Clinical Data: Partial small bowel obstruction.  PORTABLE ABDOMEN - 2 VIEW  Comparison: Radiograph of January 07, 2013; CT scan of March 31, 2013.  Findings: Residual contrast is seen in the distal colon.  Mildly dilated loops of small bowel seen in the central portion of the abdomen consistent with the partial small bowel obstruction noted on prior study.  No abnormal calcifications are noted.  No osseous abnormality is noted.  IMPRESSION: Mildly dilated loops of small bowel seen in central portion of the abdomen consistent with partial small bowel obstruction seen on prior CT scan.   Original Report Authenticated By: Lupita Raider.,  M.D.    Microbiology: Recent Results (from the past  240 hour(s))  SURGICAL PCR SCREEN     Status: None   Collection Time    04/02/13  5:22 AM      Result Value Range Status   MRSA, PCR NEGATIVE  NEGATIVE Final   Staphylococcus aureus NEGATIVE  NEGATIVE Final   Comment:            The Xpert SA Assay (FDA     approved for NASAL specimens     in patients over 66 years of age),     is one component of     a comprehensive surveillance     program.  Test performance has     been validated by [employer] for patients greater     than or equal to 3 year old.     It is not intended     to diagnose infection nor to     guide or monitor treatment.     Labs: Basic Metabolic Panel:  Recent Labs Lab 03/31/13 1643 04/01/13 0603 04/02/13 0445 04/03/13 0530 04/04/13 0500  NA 137 138 141 137 140  K 5.7* 3.5 3.7 3.7 3.1*  CL 103 107 108 103 103  CO2 22 26 27 27 26   GLUCOSE 101* 116* 107* 141* 112*  BUN 5* 5* 3* 3* 3*  CREATININE 0.58 0.66 0.64 0.56 0.57  CALCIUM 9.3 8.5 8.8 8.7 8.9   Liver Function Tests:  Recent Labs Lab 03/31/13 1643  AST 35  ALT 20  ALKPHOS 61  BILITOT 0.6  PROT 7.5  ALBUMIN 4.0    Recent Labs Lab 03/31/13 1643  LIPASE 16   CBC:  Recent Labs Lab 03/31/13 1643 04/01/13 0603 04/02/13 0445 04/03/13 0530 04/04/13 0500  WBC 8.1 6.7 6.1 11.3* 8.4  NEUTROABS 5.6  --   --   --   --   HGB 15.3* 11.6* 11.0* 11.9* 10.8*  HCT 44.8 35.3* 33.3* 35.6* 32.8*  MCV 85.0 86.3 87.2 85.8 86.5  PLT 231 172 165 183 178   Active Problems:   DCIS (ductal carcinoma in situ)   Partial small bowel obstruction Diaganostic Lap.; Small Bowel Resection  Excision of Left Breast Mass Small bowel resection for stage II carcinoid   Time coordinating discharge: 30 mins  Signed:  Romelo Sciandra, ANP-BC

## 2013-04-07 NOTE — Discharge Summary (Signed)
Margaret Kimmet, MD, MPH, FACS Pager: [phone]  

## 2013-04-10 ENCOUNTER — Ambulatory Visit (INDEPENDENT_AMBULATORY_CARE_PROVIDER_SITE_OTHER): Payer: Medicaid Other

## 2013-04-10 DIAGNOSIS — Z4802 Encounter for removal of sutures: Secondary | ICD-10-CM

## 2013-04-10 NOTE — Progress Notes (Signed)
Patient comes in for staple removal s/p diagnostic laparoscopy for SBO. Incision is healed and intact with no signs of infection. All staples were removed without difficulty. Steri strips were applied. Patient instructed to leave steri strips on 7-10 days. Patient withh follow up with Dr Magnus Ivan on 8/12.

## 2013-04-22 ENCOUNTER — Encounter (INDEPENDENT_AMBULATORY_CARE_PROVIDER_SITE_OTHER): Payer: Self-pay | Admitting: Surgery

## 2013-04-22 ENCOUNTER — Other Ambulatory Visit (INDEPENDENT_AMBULATORY_CARE_PROVIDER_SITE_OTHER): Payer: Self-pay | Admitting: Surgery

## 2013-04-22 ENCOUNTER — Ambulatory Visit (INDEPENDENT_AMBULATORY_CARE_PROVIDER_SITE_OTHER): Payer: Medicaid Other | Admitting: Surgery

## 2013-04-22 VITALS — BP 102/80 | HR 81 | Resp 14 | Ht 66.0 in | Wt 141.0 lb

## 2013-04-22 DIAGNOSIS — Z8719 Personal history of other diseases of the digestive system: Secondary | ICD-10-CM

## 2013-04-22 DIAGNOSIS — Z09 Encounter for follow-up examination after completed treatment for conditions other than malignant neoplasm: Secondary | ICD-10-CM

## 2013-04-22 DIAGNOSIS — C50912 Malignant neoplasm of unspecified site of left female breast: Secondary | ICD-10-CM

## 2013-04-22 NOTE — Progress Notes (Signed)
Subjective:     Patient ID: [name], female   DOB: 1956/06/25, 57 y.o.   MRN: [id number]  HPI She is here for her first postop visit. Again, she is status post excision of a left breast mass and exploratory laparotomy for small bowel obstruction. The left breast mass revealed a fibroadenoma but there was also ductal carcinoma in situ. The small bowel obstruction showed a carcinoid tumor. Currently, she is doing well and has no complaints  Review of Systems     Objective:   Physical Exam On exam, both her incisions are well healed.    Assessment:     Patient stable postop with recent findings of ductal carcinoma in situ of the left breast and carcinoid tumor of the small bowel     Plan:     Because of her mammogram showing no rollout maladies other than a fibroadenoma, she needs a bilateral breast MRI prior to reexcision of the biopsy site for negative margins. She will also need referral to the cancer center for discussion with from an oncological standpoint. I will see her back after the MRI to determine whether or not other areas in the breast need to be addressed preoperatively.

## 2013-04-24 ENCOUNTER — Telehealth: Payer: Self-pay | Admitting: *Deleted

## 2013-04-25 NOTE — Telephone Encounter (Signed)
Left message for pt to return my call so I can schedule her for a med onc appt. 

## 2013-04-28 ENCOUNTER — Telehealth: Payer: Self-pay | Admitting: *Deleted

## 2013-04-28 NOTE — Telephone Encounter (Signed)
Spoke to pt concerning new breast cancer appt.   Scheduled pt for 05/15/13 at 1:30pm to see Dr. Welton Flakes.  Confirmed appt date and time.  Gave pt instructions and contact information.  Will send pt intake form and new pt letter.  Pt denies further needs at this time.  Encourage pt to call with needs.

## 2013-04-29 ENCOUNTER — Ambulatory Visit
Admission: RE | Admit: 2013-04-29 | Discharge: 2013-04-29 | Disposition: A | Discharge status: Home / Self Care | Payer: Medicaid Other | Source: Ambulatory Visit | Attending: Surgery | Admitting: Surgery

## 2013-04-29 DIAGNOSIS — C50912 Malignant neoplasm of unspecified site of left female breast: Secondary | ICD-10-CM

## 2013-04-29 MED ORDER — GADOBENATE DIMEGLUMINE 529 MG/ML IV SOLN
13.0000 mL | Freq: Once | INTRAVENOUS | Status: AC | PRN
  Administered 2013-04-29: 13 mL via INTRAVENOUS

## 2013-05-14 ENCOUNTER — Other Ambulatory Visit: Payer: Self-pay | Admitting: Medical Oncology

## 2013-05-14 DIAGNOSIS — N6320 Unspecified lump in the left breast, unspecified quadrant: Secondary | ICD-10-CM

## 2013-05-15 ENCOUNTER — Telehealth: Payer: Self-pay | Admitting: Oncology

## 2013-05-15 ENCOUNTER — Ambulatory Visit: Payer: Medicaid Other

## 2013-05-15 ENCOUNTER — Other Ambulatory Visit (HOSPITAL_BASED_OUTPATIENT_CLINIC_OR_DEPARTMENT_OTHER): Payer: Medicaid Other | Admitting: Lab

## 2013-05-15 ENCOUNTER — Encounter: Payer: Self-pay | Admitting: Oncology

## 2013-05-15 ENCOUNTER — Ambulatory Visit (HOSPITAL_BASED_OUTPATIENT_CLINIC_OR_DEPARTMENT_OTHER): Payer: Medicaid Other | Admitting: Oncology

## 2013-05-15 VITALS — BP 145/80 | HR 60 | Temp 98.4°F | Resp 20 | Ht 66.0 in | Wt 142.1 lb

## 2013-05-15 DIAGNOSIS — Z170 Estrogen receptor positive status [ER+]: Secondary | ICD-10-CM

## 2013-05-15 DIAGNOSIS — C7A010 Malignant carcinoid tumor of the duodenum: Secondary | ICD-10-CM

## 2013-05-15 DIAGNOSIS — D0590 Unspecified type of carcinoma in situ of unspecified breast: Secondary | ICD-10-CM

## 2013-05-15 DIAGNOSIS — N6320 Unspecified lump in the left breast, unspecified quadrant: Secondary | ICD-10-CM

## 2013-05-15 DIAGNOSIS — N63 Unspecified lump in unspecified breast: Secondary | ICD-10-CM

## 2013-05-15 LAB — COMPREHENSIVE METABOLIC PANEL (CC13)
AST: 13 U/L (ref 5–34)
Albumin: 3.5 g/dL (ref 3.5–5.0)
Alkaline Phosphatase: 65 U/L (ref 40–150)
BUN: 7 mg/dL (ref 7.0–26.0)
Potassium: 4.5 mEq/L (ref 3.5–5.1)
Sodium: 144 mEq/L (ref 136–145)
Total Protein: 6.4 g/dL (ref 6.4–8.3)

## 2013-05-15 LAB — CBC WITH DIFFERENTIAL/PLATELET
BASO%: 0.4 % (ref 0.0–2.0)
EOS%: 1.4 % (ref 0.0–7.0)
MCH: 28.3 pg (ref 25.1–34.0)
MCHC: 33 g/dL (ref 31.5–36.0)
MCV: 85.7 fL (ref 79.5–101.0)
MONO%: 7.4 % (ref 0.0–14.0)
RBC: 4.33 10*6/uL (ref 3.70–5.45)
RDW: 14.4 % (ref 11.2–14.5)

## 2013-05-15 NOTE — Patient Instructions (Addendum)
Proceed with radiation therapy initially  We discussed your diagnosis of breast cancer and neuroendocrine tumor  We discussed the treatment options  You will need radiation therapy first   We discussed prevention of future breast cancer risk with aromasin for 5 years  Exemestane tablets What is this medicine? EXEMESTANE (ex e MES tane) blocks the production of the hormone estrogen. Some types of breast cancer depend on estrogen to grow, and this medicine can stop tumor growth by blocking estrogen production. This medicine is for the treatment of breast cancer in postmenopausal women only. This medicine may be used for other purposes; ask your health care provider or pharmacist if you have questions. What should I tell my health care provider before I take this medicine? They need to know if you have any of these conditions: -an unusual or allergic reaction to exemestane, other medicines, foods, dyes, or preservatives -pregnant or trying to get pregnant -breast-feeding How should I use this medicine? Take this medicine by mouth with a glass of water. Follow the directions on the prescription label. Take your doses at regular intervals after a meal. Do not take your medicine more often than directed. Do not stop taking except on the advice of your doctor or health care professional. Contact your pediatrician regarding the use of this medicine in children. Special care may be needed. Overdosage: If you think you have taken too much of this medicine contact a poison control center or emergency room at once. NOTE: This medicine is only for you. Do not share this medicine with others. What if I miss a dose? If you miss a dose, take the next dose as usual. Do not try to make up the missed dose. Do not take double or extra doses. What may interact with this medicine? Do not take this medicine with any of the following medications: -female hormones, like estrogens and birth control pills This  medicine may also interact with the following medications: -androstenedione -phenytoin -rifabutin, rifampin, or rifapentine -St. John's Wort This list may not describe all possible interactions. Give your health care provider a list of all the medicines, herbs, non-prescription drugs, or dietary supplements you use. Also tell them if you smoke, drink alcohol, or use illegal drugs. Some items may interact with your medicine. What should I watch for while using this medicine? Visit your doctor or health care professional for regular checks on your progress. If you experience hot flashes or sweating while taking this medicine, avoid alcohol, smoking and drinks with caffeine. This may help to decrease these side effects. What side effects may I notice from receiving this medicine? Side effects that you should report to your doctor or health care professional as soon as possible: -any new or unusual symptoms -changes in vision -fever -leg or arm swelling -pain in bones, joints, or muscles -pain in hips, back, ribs, arms, shoulders, or legs Side effects that usually do not require medical attention (report to your doctor or health care professional if they continue or are bothersome): -difficulty sleeping -headache -hot flashes -sweating -unusually weak or tired This list may not describe all possible side effects. Call your doctor for medical advice about side effects. You may report side effects to FDA at 1-800-FDA-1088. Where should I keep my medicine? Keep out of the reach of children. Store at room temperature between 15 and 30 degrees C (59 and 86 degrees F). Throw away any unused medicine after the expiration date. NOTE: This sheet is a summary. It may not  cover all possible information. If you have questions about this medicine, talk to your doctor, pharmacist, or health care provider.  2013, [employer]vier/Gold Standard. (12/31/2007 11:48:29 AM)

## 2013-05-15 NOTE — Telephone Encounter (Signed)
, °

## 2013-05-15 NOTE — Progress Notes (Signed)
Spoke with patient after her appt. With Dr. Welton Flakes.  Contact information given.  In-basket sent to Dr. Magnus Ivan for follow up after her MRI.  Will continue to follow.  No other questions or concerns at this time.

## 2013-05-15 NOTE — Progress Notes (Signed)
Checked in new patient and she is not sure if active [employer] access. I sent email to Tiffany M.--not sure if referral was done. She wants all communication via mail and phone.

## 2013-05-16 ENCOUNTER — Encounter: Payer: Self-pay | Admitting: Radiation Oncology

## 2013-05-16 NOTE — Progress Notes (Addendum)
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 Breast, excision, LeftFIBROADENOMA WITH EXTENSIVE INVOLVEMENT WITH LOW GRADE CARCINOMA IN SITU.- ESTIMATED TUMOR SIZE IS 3.5 CM IN GREATEST DIMENSION. - LOW GRADE DUCTAL CARCINOMA IN SITU LESS THAN 0.1 CM FROM INKED MARGIN.- SEE ONCOLOGY TEMPLATE.2. Small intestine, resection, Small- INVASIVE WELL DIFFERENTIATED NEUROENDOCRINE TUMOR (CARCINOID), SPANNING 2 CM IN GREATEST(low grade DCIS)  Past/Anticipated interventions by surgeon, if any: 04/02/13: 1. Excision of left breast mass. 2. Diagnostic laparoscopy. 3. Small bowel resection. Dr. Abigail Miyamoto  Past/Anticipated interventions by medical oncology, if any: no  Weight changes, if any: no Bowel/Bladder complaints, if any: regular bowels, no bladder c/o Nausea / Vomiting, if any: no   Pain issues, if any:  Under left axilla,dull aching, also lower left abdomen still has sharp stabbing pains  Still, for 10 years has been pain there, at pain management clinic  SAFETY ISSUES:  Prior radiation? no  Pacemaker/ICD? no  Possible current pregnancy? no  Is the patient on methotrexate? no  Current Complaints / other details: separated 10 years, anxiety/depressio,    hX TIA, smoker 1/2ppd 35 years.,drinks alcohol, Mother deceased cervical cancer, heart disease,diabetes,   sister alive  hx cervical cancer, father hx lung cancer,  Menses age 81, 1st pregnancy age 47,6 months  Carried, daughter passed away 13 hours later, set twins 1 year later,carried them 6 months also, hospital  For 4 months, then 10 years later full term son G22,P3

## 2013-05-19 ENCOUNTER — Ambulatory Visit
Admission: RE | Admit: 2013-05-19 | Discharge: 2013-05-19 | Disposition: A | Discharge status: Home / Self Care | Payer: Medicaid Other | Source: Ambulatory Visit | Attending: Radiation Oncology | Admitting: Radiation Oncology

## 2013-05-19 ENCOUNTER — Encounter: Payer: Self-pay | Admitting: Radiation Oncology

## 2013-05-19 VITALS — BP 136/89 | HR 66 | Temp 97.5°F | Resp 20 | Ht 66.0 in | Wt 142.5 lb

## 2013-05-19 DIAGNOSIS — K56600 Partial intestinal obstruction, unspecified as to cause: Secondary | ICD-10-CM

## 2013-05-19 DIAGNOSIS — C7A019 Malignant carcinoid tumor of the small intestine, unspecified portion: Secondary | ICD-10-CM | POA: Insufficient documentation

## 2013-05-19 DIAGNOSIS — D0590 Unspecified type of carcinoma in situ of unspecified breast: Secondary | ICD-10-CM | POA: Insufficient documentation

## 2013-05-19 DIAGNOSIS — C50519 Malignant neoplasm of lower-outer quadrant of unspecified female breast: Secondary | ICD-10-CM

## 2013-05-19 DIAGNOSIS — K56609 Unspecified intestinal obstruction, unspecified as to partial versus complete obstruction: Secondary | ICD-10-CM

## 2013-05-19 HISTORY — DX: Allergy, unspecified, initial encounter: T78.40XA

## 2013-05-19 HISTORY — DX: Unspecified osteoarthritis, unspecified site: M19.90

## 2013-05-19 HISTORY — DX: Anxiety disorder, unspecified: F41.9

## 2013-05-19 HISTORY — DX: Cerebral infarction, unspecified: I63.9

## 2013-05-19 HISTORY — DX: Malignant neoplasm of lower-outer quadrant of unspecified female breast: C50.519

## 2013-05-19 NOTE — Progress Notes (Signed)
Radiation Oncology         ([phone]) [phone] ________________________________  Name: [name] MRN: [id number]  Date: 05/19/2013  DOB: [birth year]  NW:GNFAOZ,HYQMVHQ C, MD  Victorino December, MD     REFERRING PHYSICIAN: Victorino December, MD   DIAGNOSIS: Ductal carcinoma in situ of the left breast; the patient also has a recent diagnosis of small bowel carcinoid tumor  HISTORY OF PRESENT ILLNESS::[name] is a 57 y.o. female who is seen for an initial consultation visit. The patient is seen today regarding her recent diagnosis of left-sided breast cancer. The patient had been noted to have a left breast mass previously. She underwent an ultrasound guided biopsy on 12/06/2011. Pathology revealed a fibroadenoma. The patient however noted recently some enlargement in the left breast tumor which was associated with some discomfort. She therefore sought additional evaluation for this area and an excision was recommended. This was completed by Dr. Magnus Ivan on 04/02/2013. The pathology revealed a fibroadenoma with extensive involvement with low-grade carcinoma in situ. The estimated tumor size was 3.5 cm in greatest dimension. Ductal carcinoma in situ extended to less than 0.1 cm from the inked margin.  The patient subsequently has undergone an MRI scan breasts bilaterally. This demonstrated an enlarging mass in the inferior left breast which was felt to represent a seroma. There is also what was felt to be a fibroadenoma within the lower inner quadrant of the left breast which is adjacent to the seroma. No findings of malignancy in the right breast and no axillary adenopathy on either side which were abnormal.  The patient has seen Dr. Park Breed who discussed possible hormonal treatment with her after radiation treatment. The patient indicates that she is feeling well postoperatively in terms of recovering.  The patient notably also had an exploratory laparotomy at the time of her lumpectomy. She was  suffering from a small bowel obstruction and this did return positive for a carcinoid tumor spanning 2 cm which was present associate with the small intestine. The margins were negative. 2/5 lymph nodes were positive. This represented a T3 N1 tumor.   PREVIOUS RADIATION THERAPY: No   PAST MEDICAL HISTORY:  has a past medical history of Hepatitis C; Jaundice; Breast disorder; TIA (transient ischemic attack) (OCT 2012); Hyperlipidemia; Varicose veins; Brain aneurysm (2012); Adjustment disorder with mixed anxiety and depressed mood (01/05/2013); Stroke; Allergy; Anxiety; Arthritis; and Malignant neoplasm of lower-outer quadrant of female breast (05/19/2013).     PAST SURGICAL HISTORY: Past Surgical History  Procedure Laterality Date  . Laparoscopy N/A 04/02/2013    Procedure: Diaganostic Lap.; Small Bowel Resection;  Surgeon: Shelly Rubenstein, MD;  Location[facility];  Service: General;  Laterality: N/A;  . Breast lumpectomy Left 04/02/2013    Procedure: Excision  of Left Breast Mass;  Surgeon: Shelly Rubenstein, MD;  Location: [facility] OR;  Service: General;  Laterality: Left;     FAMILY HISTORY: family history includes Cancer in her father, mother, and sister; Diabetes in her brother, mother, paternal grandfather, paternal grandmother, and sister; Heart disease in her mother and sister; Stroke in her brother.   SOCIAL HISTORY:  reports that she has been smoking.  She has never used smokeless tobacco. She reports that  drinks alcohol. She reports that she does not use illicit drugs.   ALLERGIES: Statins; Ace inhibitors; and Ciprofloxacin hcl   MEDICATIONS:  Current Outpatient Prescriptions  Medication Sig Dispense Refill  . ALPRAZolam (XANAX) 1 MG tablet 1 mg. Take 1 tablet (1  mg total) by mouth 3 (three) times a day as needed.      . Multiple Vitamins-Minerals (MULTIVITAMIN WITH MINERALS) tablet Take 1 tablet by mouth daily.      Marland Kitchen OVER THE COUNTER MEDICATION Take by mouth as needed. Fiber pill       . oxyCODONE (OXYCONTIN) 10 MG 12 hr tablet Take 10 mg by mouth every 12 (twelve) hours. Takes half a pill      . aspirin 325 MG tablet 325 mg. Take 325 mg by mouth daily.      . Probiotic Product (PROBIOTIC DAILY PO) Take by mouth.       No current facility-administered medications for this encounter.     REVIEW OF SYSTEMS:  A 15 point review of systems is documented in the electronic medical record. This was obtained by the nursing staff. However, I reviewed this with the patient to discuss relevant findings and make appropriate changes.  Pertinent items are noted in HPI.    PHYSICAL EXAM:  height is 5\' 6"  (1.676 m) and weight is 142 lb 8 oz (64.638 kg). Her oral temperature is 97.5 F (36.4 C). Her blood pressure is 136/89 and her pulse is 66. Her respiration is 20.   General: Well-developed, in no acute distress HEENT: Normocephalic, atraumatic; oral cavity clear Neck: Supple without any lymphadenopathy Cardiovascular: Regular rate and rhythm Respiratory: Clear to auscultation bilaterally GI: Soft, nontender, normal bowel sounds Extremities: No edema present Neuro: No focal deficits  ECOG = 1  0 - Asymptomatic (Fully active, able to carry on all predisease activities without restriction)  1 - Symptomatic but completely ambulatory (Restricted in physically strenuous activity but ambulatory and able to carry out work of a light or sedentary nature. For example, light housework, office work)  2 - Symptomatic, <50% in bed during the day (Ambulatory and capable of all self care but unable to carry out any work activities. Up and about more than 50% of waking hours)  3 - Symptomatic, >50% in bed, but not bedbound (Capable of only limited self-care, confined to bed or chair 50% or more of waking hours)  4 - Bedbound (Completely disabled. Cannot carry on any self-care. Totally confined to bed or chair)  5 - Death   Santiago Glad MM, Creech RH, Tormey DC, et al. [phone]). "Toxicity and  response criteria of the [facility]". Am. Evlyn Clines. Oncol. 5 (6): 649-55   LABORATORY DATA:  Lab Results  Component Value Date   WBC 7.4 05/15/2013   HGB 12.2 05/15/2013   HCT 37.1 05/15/2013   MCV 85.7 05/15/2013   PLT 192 05/15/2013   Lab Results  Component Value Date   NA 144 05/15/2013   K 4.5 05/15/2013   CL 103 04/04/2013   CO2 27 05/15/2013   Lab Results  Component Value Date   ALT 14 05/15/2013   AST 13 05/15/2013   ALKPHOS 65 05/15/2013   BILITOT 0.40 05/15/2013      RADIOGRAPHY: Mr Breast Bilateral W Wo Contrast  04/29/2013   CLINICAL DATA:  Enlarging left breast previously biopsied fibroadenoma at recent mammography and ultrasound. 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  EXAM: BILATERAL BREAST MRI WITH AND WITHOUT CONTRAST  LABS:  None.  TECHNIQUE: Multiplanar, multisequence MR images of both breasts were obtained prior to and following the intravenous administration of 13ml of MultiHance.  THREE-DIMENSIONAL MR IMAGE RENDERING ON INDEPENDENT WORKSTATION:  Three-dimensional MR images were rendered  by post-processing of the original MR data on an independent workstation. The three-dimensional MR images were interpreted, and findings are reported in the following complete MRI report for this study.  COMPARISON:  Current and recent imaging examinations  FINDINGS: Breast composition: b. Scattered fibroglandular tissue  Background parenchymal enhancement: Moderate  Right breast: No mass or abnormal enhancement.  Left breast: 10 x 7 x 6 mm rounded enhancing mass in the lower inner quadrant of the left breast in the middle 3rd. This has plateau enhancement kinetics. This is at the inferior, posterior aspect of a seroma containing a fluid/hematocrit level and surrounding hemosiderin rim in the 6 o'clock position of the left breast in the middle 3rd. This seroma corresponds to the enlarging mass seen on recent mammography and  ultrasound. There is a biopsy marker clip artifact 7 mm lateral to the rounded enhancing mass. This 10 x 7 x 6 mm enhancing mass corresponds to the 15 x 12 x 11 mm fibroadenoma biopsied on 12/06/2011. No other abnormalities are seen in the left breast.  Lymph nodes: No abnormal appearing lymph nodes.  Ancillary findings:  None.  IMPRESSION: 1. The recently demonstrated enlarging mass in the inferior left breast corresponds to a chronic post biopsy seroma.  2. Interval mild decrease in size of a previously biopsied fibroadenoma in the lower inner quadrant of the left breast. This is adjacent to the seroma.  3. No evidence of malignancy in either breast.  RECOMMENDATION: Annual screening mammography. The patient will be due for her next screening mammogram in 10 months.  2: Benign Finding(s)   Electronically Signed   By: Gordan Payment   On: 04/29/2013 16:49       IMPRESSION: The patient is a pleasant 57 year old female with a recent diagnosis of DCIS of the left breast. She also simultaneously he was found to have a carcinoid tumor of the small bowel status post resection.  The patient is an appropriate candidate for adjuvant radiotherapy to reduce the chance of local/regional recurrence. I discussed this with her. We discussed the possible benefit of treatment as well as the possible side effects and risks. All of her questions were answered. I would anticipate a 6-1/2 week course of treatment.  The patient has seen Dr. Park Breed to discuss possible antifungal treatment after radiation treatment.  The patient's margin status was close with DCIS extending to less than 0.1 cm. The patient's MRI scan also was somewhat complex and this will be reviewed with the patient by Dr. Magnus Ivan when he sees her next week for consideration of re-excision.   PLAN: I will await her visit with Dr. Magnus Ivan. From my perspective, given the close margin and MRI findings, I believe that reexcision is a reasonable consideration. I  discussed this with the patient and she understands that we will bring her back to our clinic at the appropriate time for coordination of adjuvant radiotherapy.    I spent 60 minutes minutes face to face with the patient and more than 50% of that time was spent in counseling and/or coordination of care.    ________________________________   Radene Gunning, MD, PhD

## 2013-05-19 NOTE — Progress Notes (Signed)
Please see the Nurse Progress Note in the MD Initial Consult Encounter for this patient. 

## 2013-05-22 ENCOUNTER — Encounter: Payer: Self-pay | Admitting: *Deleted

## 2013-05-22 NOTE — Progress Notes (Signed)
[facility] Psychosocial Distress Screening Clinical Social Work  Clinical Social Work was referred by distress screening protocol.  The patient scored a 8 on the Psychosocial Distress Thermometer which indicates extreme distress. Clinical Social Worker phoned Pt to follow up and further assess for distress and other psychosocial needs. Pt shared financial concerns, family issues and emotional distress are frequent concerns. She reports to take xanax for her anxiety and this is helpful, but still has issues with sleep. Pt receives disability, medicaid and food stamps ($53 a month) for some assistance. Her son lives with her and is supportive, but has his own mental health concerns. Pt shared concerns about her treatment plan and adjusting to her illness were also concerns.  CSW provided supportive listening and education re. [facility] resources. CSW mailed her a calendar and other resource info.   Clinical Social Worker follow up needed: yes  If yes, follow up plan: Check in at follow up appt in December, mailed resources to Pt's home.    Doreen Salvage, LCSW Clinical Social Worker [facility] Wednesday, Thursday and Friday Phone: [phone] Fax: [phone]

## 2013-05-23 ENCOUNTER — Ambulatory Visit (HOSPITAL_COMMUNITY)
Admission: RE | Admit: 2013-05-23 | Discharge: 2013-05-23 | Disposition: A | Discharge status: Home / Self Care | Payer: Medicaid Other | Source: Ambulatory Visit | Attending: Oncology | Admitting: Oncology

## 2013-05-23 ENCOUNTER — Encounter (HOSPITAL_COMMUNITY): Payer: Self-pay

## 2013-05-23 DIAGNOSIS — C7A010 Malignant carcinoid tumor of the duodenum: Secondary | ICD-10-CM

## 2013-05-23 DIAGNOSIS — D3A010 Benign carcinoid tumor of the duodenum: Secondary | ICD-10-CM | POA: Insufficient documentation

## 2013-05-23 DIAGNOSIS — I708 Atherosclerosis of other arteries: Secondary | ICD-10-CM | POA: Insufficient documentation

## 2013-05-23 DIAGNOSIS — J438 Other emphysema: Secondary | ICD-10-CM | POA: Insufficient documentation

## 2013-05-23 MED ORDER — IOHEXOL 300 MG/ML  SOLN
100.0000 mL | Freq: Once | INTRAMUSCULAR | Status: AC | PRN
  Administered 2013-05-23: 100 mL via INTRAVENOUS

## 2013-05-26 ENCOUNTER — Encounter (INDEPENDENT_AMBULATORY_CARE_PROVIDER_SITE_OTHER): Payer: Medicaid Other | Admitting: Surgery

## 2013-05-27 ENCOUNTER — Ambulatory Visit (INDEPENDENT_AMBULATORY_CARE_PROVIDER_SITE_OTHER): Payer: Medicaid Other | Admitting: Surgery

## 2013-05-27 ENCOUNTER — Encounter (INDEPENDENT_AMBULATORY_CARE_PROVIDER_SITE_OTHER): Payer: Self-pay | Admitting: Surgery

## 2013-05-27 VITALS — BP 122/74 | HR 68 | Resp 16 | Ht 66.0 in | Wt 139.4 lb

## 2013-05-27 DIAGNOSIS — C50919 Malignant neoplasm of unspecified site of unspecified female breast: Secondary | ICD-10-CM

## 2013-05-27 DIAGNOSIS — Z09 Encounter for follow-up examination after completed treatment for conditions other than malignant neoplasm: Secondary | ICD-10-CM

## 2013-05-27 DIAGNOSIS — C50912 Malignant neoplasm of unspecified site of left female breast: Secondary | ICD-10-CM

## 2013-05-27 NOTE — Progress Notes (Signed)
Subjective:     Patient ID: [name], female   DOB: 07-22-1956, 57 y.o.   MRN: [id number]  HPI She is here for a followup. She actually been lost to followup temporarily. She has actually seen oncology. She now is ready to schedule her for reexcision of the ductal carcinoma in situ of the left breast. She is doing great  Review of Systems     Objective:   Physical Exam Her incisions are well-healed    Assessment:     Patient stable postop     Plan:     I will now scheduled her for re\re lumpectomy of the left breast with left breast and the lymph node biopsy. Risks were discussed

## 2013-05-28 ENCOUNTER — Encounter (HOSPITAL_COMMUNITY): Payer: Self-pay | Admitting: Pharmacy Technician

## 2013-05-31 NOTE — Progress Notes (Signed)
[name] [id number] 1956/07/08 56 y.o. 05/31/2013 8:36 PM  CC  [name], MD [address] [place] [address] Dr. 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 Abigail Miyamoto  HISTORY OF PRESENT ILLNESS:  [name] is a 57 y.o. female.  Patient noted a left breast mass she underwent ultrasound-guided biopsy on 12/06/2011 that showed a fibroadenoma. Recently she had enlargement in the left breast that was associated with some discomfort. She went on to have an excision performed on 04/02/2013. The pathology revealed a fibroadenoma with extensive involvement with low-grade ductal carcinoma in situ. The tumor was 3.5 cm in dimension. The ductal carcinoma in situ extended to less than 0.1 cm from the inked margin. Patient had MRI of the breast performed post R. This demonstrated enlarging mass in the inferior left breast representing a seroma there was also a fibroadenoma within the lower inner quadrant of left breast adjacent to the seroma no malignancies in the right side no axillary adenopathy.  Patient also has had a exploratory laparotomy after having small bowel obstruction. She had a resection performed that showed a 2 cm well-differentiated  Neuroendocrine tumor with 2 of 5 lymph nodes positive. T3 N1 tumor. Postoperatively she is doing well for that as well.  Today patient is without any clinical complaints. She is accompanied by her family member.   Past Medical History: Past Medical History  Diagnosis Date  . Hepatitis C    . Jaundice   . Breast disorder     lump in left breast  . TIA (transient ischemic attack) OCT 2012  . Hyperlipidemia   . Varicose veins   . Brain aneurysm 2012  . Adjustment disorder with mixed anxiety and depressed mood 01/05/2013  . Stroke     TIA  . Allergy   . Anxiety   . Arthritis     knees,wrists   . Malignant neoplasm of lower-outer quadrant of female breast 05/19/2013    Past Surgical History: Past Surgical History  Procedure Laterality Date  . Laparoscopy N/A 04/02/2013    Procedure: Diaganostic Lap.; Small Bowel Resection;  Surgeon: Shelly Rubenstein, MD;  Location: [facility] OR;  Service: General;  Laterality: N/A;  . Breast lumpectomy Left 04/02/2013    Procedure: Excision  of Left Breast Mass;  Surgeon: Shelly Rubenstein, MD;  Location: [facility] OR;  Service: General;  Laterality: Left;    Family History: Family History  Problem Relation Age of Onset  . Diabetes Paternal Grandfather   . Diabetes Paternal Grandmother   . Cancer Father     lung  . Diabetes Mother   . Heart disease Mother   . Cancer Mother     cervical  . Diabetes Brother   . Stroke Brother   . Heart disease Sister   . Diabetes Sister   . Cancer Sister     cervical    Social History History  Substance Use Topics  . Smoking status: Current Every Day Smoker -- 0.50 packs/day for 35 years  . Smokeless tobacco: Never Used  . Alcohol Use: Yes  Comment: seldom    Allergies: Allergies  Allergen Reactions  . Statins Swelling    Stomach swelling & muscle ache  . Ace Inhibitors Other (See Comments)    Other reaction(s): Palpitations  . Ciprofloxacin Hcl Palpitations    Current Medications: Current Outpatient Prescriptions  Medication Sig Dispense Refill  . ALPRAZolam (XANAX) 1 MG tablet 1 mg 3 (three) times daily. Takes 2-3 times daily      . Multiple Vitamins-Minerals (MULTIVITAMIN WITH MINERALS) tablet Take 1 tablet by mouth daily.      Marland Kitchen oxyCODONE (OXYCONTIN) 10 MG 12 hr tablet Take 5-10  mg by mouth every 12 (twelve) hours as needed. For pain      . aspirin EC 81 MG tablet Take 81 mg by mouth 2 (two) times daily.       No current facility-administered medications for this visit.    OB/GYN History:menarche at age 73 she underwent menopause in 2001 she said 3 live births first live birth was at 61  Fertility Discussion: n/a Prior History of Cancer: no  Health Maintenance:  Colonoscopy 2014 Bone Density no Last PAP smear 2012  ECOG PERFORMANCE STATUS: 1 - Symptomatic but completely ambulatory  Genetic Counseling/testing: no  REVIEW OF SYSTEMS:  Constitutional: positive for fatigue and night sweats Ears, nose, mouth, throat, and face: negative Respiratory: negative Cardiovascular: negative Gastrointestinal: positive for nausea and reflux symptoms Genitourinary:negative Integument/breast: positive for breast tenderness Hematologic/lymphatic: negative Musculoskeletal:positive for arthralgias Neurological: negative  PHYSICAL EXAMINATION: Blood pressure 145/80, pulse 60, temperature 98.4 F (36.9 C), temperature source Oral, resp. rate 20, height 5\' 6"  (1.676 m), weight 142 lb 1.6 oz (64.456 kg).  WUJ:WJXBJ, healthy, no distress, well nourished, well developed and anxious SKIN: skin color, texture, turgor are normal HEAD: Normocephalic EYES: PERRLA, EOMI, Conjunctiva are pink and non-injected EARS: External ears normal OROPHARYNX:no exudate, no erythema and dentition normal  NECK: supple, no adenopathy, thyroid normal size, non-tender, without nodularity LYMPH:  no palpable lymphadenopathy, no hepatosplenomegaly, no cervical supraclavicular or axillary adenopathy BREAST:right breast normal without mass, skin or nipple changes or axillary nodes, surgical scars noted well-healed in the left breast LUNGS: clear to auscultation and percussion HEART: regular rate & rhythm ABDOMEN:abdomen soft, non-tender, normal bowel sounds, no masses or organomegaly and surgical  scar well healing BACK: Back symmetric, no curvature., No CVA tenderness, Range of motion is normal EXTREMITIES:no edema, no clubbing, no cyanosis  NEURO: alert & oriented x 3 with fluent speech, no focal motor/sensory deficits, gait normal, reflexes normal and symmetric     STUDIES/RESULTS: Ct Chest W Contrast  05/23/2013   CLINICAL DATA:  Followup evaluation for carcinoid tumor of the duodenum.  EXAM: CT CHEST, ABDOMEN, AND PELVIS WITH CONTRAST  TECHNIQUE: Multidetector CT imaging of the chest, abdomen and pelvis was performed following the standard protocol during bolus administration of intravenous contrast.  CONTRAST:  OMNIPAQUE IOHEXOL 300 MG/ML  SOLN  COMPARISON:  CT of the abdomen and pelvis 03/31/2013.  FINDINGS: CT CHEST FINDINGS  Mediastinum: Heart size is normal. There is no significant pericardial fluid, thickening or pericardial calcification. No pathologically enlarged mediastinal or hilar lymph nodes. Esophagus is unremarkable in appearance.  Lungs/Pleura: No suspicious appearing pulmonary nodules or masses are identified at this time. No acute consolidative airspace disease. No pleural effusions. Mild centrilobular and paraseptal emphysema, most apparent in the lung apices.  Musculoskeletal: There are no aggressive appearing lytic or blastic lesions noted in the visualized portions of the skeleton. Surgical clips in the left breast, presumably  from prior lumpectomy. The previously noted large nodule in the left breast has been removed, however, inferior to the resection site, there are at least 2 areas of soft tissue nodularity which appear new compared the prior study, best demonstrated on image 38 of series 2, the largest of which measures approximately 1.2 cm. These are nonspecific.  CT ABDOMEN AND PELVIS FINDINGS  Abdomen/Pelvis: The appearance of the liver, gallbladder, pancreas, spleen, bilateral adrenal glands and bilateral kidneys is unremarkable. The duodenum is grossly  normal in appearance on today's examination. No significant volume of ascites. No pneumoperitoneum. No pathologic distention of small bowel. Normal appendix. No definite pathologic lymphadenopathy identified within the abdomen or pelvis. Atherosclerosis throughout the abdominal and pelvic vasculature, without definite aneurysm or dissection. Uterus and ovaries are unremarkable in appearance. Urinary bladder is normal in appearance.  Musculoskeletal: There are no aggressive appearing lytic or blastic lesions noted in the visualized portions of the skeleton.  IMPRESSION: CT CHEST IMPRESSION  1. No signs of metastatic disease to the lungs at this time. 2. Status post left breast lumpectomy. Adjacent to the lumpectomy site there are 2 areas of nodularity inferiorly which are nonspecific. These may represent areas of postoperative scarring, however, the possibility of residual or recurrent disease in this region is not excluded. Correlation with mammography and or breast ultrasound may be warranted if clinically indicated. 3. Mild centrilobular and paraseptal emphysema.  CT ABDOMEN AND PELVIS IMPRESSION  1. No definite evidence to suggest local recurrence of disease or metastatic disease in the abdomen or pelvis on today's examination. 2. Extensive atherosclerosis.   Electronically Signed   By: Trudie Reed M.D.   On: 05/23/2013 08:56   Ct Abdomen Pelvis W Contrast  05/23/2013   CLINICAL DATA:  Followup evaluation for carcinoid tumor of the duodenum.  EXAM: CT CHEST, ABDOMEN, AND PELVIS WITH CONTRAST  TECHNIQUE: Multidetector CT imaging of the chest, abdomen and pelvis was performed following the standard protocol during bolus administration of intravenous contrast.  CONTRAST:  OMNIPAQUE IOHEXOL 300 MG/ML  SOLN  COMPARISON:  CT of the abdomen and pelvis 03/31/2013.  FINDINGS: CT CHEST FINDINGS  Mediastinum: Heart size is normal. There is no significant pericardial fluid, thickening or pericardial  calcification. No pathologically enlarged mediastinal or hilar lymph nodes. Esophagus is unremarkable in appearance.  Lungs/Pleura: No suspicious appearing pulmonary nodules or masses are identified at this time. No acute consolidative airspace disease. No pleural effusions. Mild centrilobular and paraseptal emphysema, most apparent in the lung apices.  Musculoskeletal: There are no aggressive appearing lytic or blastic lesions noted in the visualized portions of the skeleton. Surgical clips in the left breast, presumably from prior lumpectomy. The previously noted large nodule in the left breast has been removed, however, inferior to the resection site, there are at least 2 areas of soft tissue nodularity which appear new compared the prior study, best demonstrated on image 38 of series 2, the largest of which measures approximately 1.2 cm. These are nonspecific.  CT ABDOMEN AND PELVIS FINDINGS  Abdomen/Pelvis: The appearance of the liver, gallbladder, pancreas, spleen, bilateral adrenal glands and bilateral kidneys is unremarkable. The duodenum is grossly normal in appearance on today's examination. No significant volume of ascites. No pneumoperitoneum. No pathologic distention of small bowel. Normal appendix. No definite pathologic lymphadenopathy identified within the abdomen or pelvis. Atherosclerosis throughout the abdominal and pelvic vasculature, without definite aneurysm or dissection. Uterus and ovaries are unremarkable in appearance. Urinary bladder is normal in appearance.  Musculoskeletal: There are  no aggressive appearing lytic or blastic lesions noted in the visualized portions of the skeleton.  IMPRESSION: CT CHEST IMPRESSION  1. No signs of metastatic disease to the lungs at this time. 2. Status post left breast lumpectomy. Adjacent to the lumpectomy site there are 2 areas of nodularity inferiorly which are nonspecific. These may represent areas of postoperative scarring, however, the possibility of  residual or recurrent disease in this region is not excluded. Correlation with mammography and or breast ultrasound may be warranted if clinically indicated. 3. Mild centrilobular and paraseptal emphysema.  CT ABDOMEN AND PELVIS IMPRESSION  1. No definite evidence to suggest local recurrence of disease or metastatic disease in the abdomen or pelvis on today's examination. 2. Extensive atherosclerosis.   Electronically Signed   By: Trudie Reed M.D.   On: 05/23/2013 08:56     LABS:    Chemistry      Component Value Date/Time   NA 144 05/15/2013 1303   NA 140 04/04/2013 0500   K 4.5 05/15/2013 1303   K 3.1* 04/04/2013 0500   CL 103 04/04/2013 0500   CO2 27 05/15/2013 1303   CO2 26 04/04/2013 0500   BUN 7.0 05/15/2013 1303   BUN 3* 04/04/2013 0500   CREATININE 0.7 05/15/2013 1303   CREATININE 0.57 04/04/2013 0500      Component Value Date/Time   CALCIUM 9.1 05/15/2013 1303   CALCIUM 8.9 04/04/2013 0500   ALKPHOS 65 05/15/2013 1303   ALKPHOS 61 03/31/2013 1643   AST 13 05/15/2013 1303   AST 35 03/31/2013 1643   ALT 14 05/15/2013 1303   ALT 20 03/31/2013 1643   BILITOT 0.40 05/15/2013 1303   BILITOT 0.6 03/31/2013 1643      Lab Results  Component Value Date   WBC 7.4 05/15/2013   HGB 12.2 05/15/2013   HCT 37.1 05/15/2013   MCV 85.7 05/15/2013   PLT 192 05/15/2013   PATHOLOGY: ADDITIONAL INFORMATION: 1. PROGNOSTIC INDICATORS - ACIS Results: IMMUNOHISTOCHEMICAL AND MORPHOMETRIC ANALYSIS BY THE AUTOMATED CELLULAR IMAGING SYSTEM (ACIS) Estrogen Receptor: 100%, POSITIVE, STRONG STAINING INTENSITY Progesterone Receptor: 100%, POSITIVE, STRONG STAINING INTENSITY REFERENCE RANGE ESTROGEN RECEPTOR NEGATIVE <1% POSITIVE =>1% PROGESTERONE RECEPTOR NEGATIVE <1% POSITIVE =>1% All controls stained appropriately Pecola Leisure MD Pathologist, Electronic Signature ( Signed 04/07/2013) FINAL DIAGNOSIS Diagnosis 1. Breast, excision, Left - FIBROADENOMA WITH EXTENSIVE INVOLVEMENT WITH LOW GRADE CARCINOMA IN SITU. -  ESTIMATED TUMOR SIZE IS 3.5 CM IN GREATEST DIMENSION. - LOW GRADE DUCTAL CARCINOMA IN SITU LESS THAN 0.1 CM FROM INKED MARGIN. - SEE ONCOLOGY TEMPLATE. 2. Small intestine, resection, Small - INVASIVE WELL DIFFERENTIATED NEUROENDOCRINE TUMOR (CARCINOID), SPANNING 2 CM IN GREATEST 1 of 3 Duplicate copy FINAL for LATOSHA, GAYLORD B [phone]) Diagnosis(continued) DIMENSION. - TUMOR INVADES THROUGH MUSCULARIS PROPRIA INTO SUBSEROSAL TISSUES. - MARGINS ARE NEGATIVE. - TWO OF FIVE LYMPH NODES ARE POSITIVE FOR METASTATIC WELL DIFFERENTIATED NEUROENDOCRINE TUMOR (2/5). - SEE ONCOLOGY TEMPLATE. Microscopic Comment 1. BREAST, IN SITU CARCINOMA Specimen, including laterality: Left partial breast. Procedure: Left breast excision. Grade of carcinoma: Low grade. Necrosis: No. Estimated tumor size: (based on gross measurement): 3.5 cm. Treatment effect: N/A. Distance to closest margin: Less than 0.1 cm from unoriented inked margin. Breast prognostic profile: Not previously performed, will be performed on current case. Lymph nodes: Examined: 0. TNM: pTis, pNX. Comments: A Cytokeratin 5/6 immunohistochemical stain is performed on a representative block. The areas of epithelial proliferation fail to stain for cytokeratin 5/6. The morphology coupled with the staining pattern is consistent  with the above diagnosis. A quantitative estrogen receptor and progesterone receptor will be performed on a representative block and reported in an addendum. Dr. Colonel Bald has seen the first specimen in consultation with agreement that the findings represent a fibroadenoma with low grade ductal carcinoma in situ. (RAH:caf 04/04/13) 2. SMALL INTESTINE NEUROENDOCRINE TUMOR Histologic Type: Well differentiated neuroendocrine tumor (carcinoid). Histologic Grade: Well differentiated/low grade. Microscopic Tumor Extension: Tumor extends through muscularis propria into subserosal tissues. Resection Margins: All margins are  negative including proximal and distal margins. Lymph-Vascular invasion: Definitive lymph-vascular invasion is not identified; however, lymph nodes are positive, see below. Perineural Invasion: Not identified. Lymph nodes: 2 of 5 lymph nodes positive for metastatic tumor. TNM code: pT3, pN1, MX. Additional Pathologic Findings: No additional significant pathologic abnormalities. Ancillary Studies: Can be performed upon request. Clinical History: Small bowel obstruction with small bowel mass. Comments: Immunohistochemical stains are performed. The tumor is positive for cytokeratin AE1/AE3, chromogranin, synaptophysin, and CD56. The morphology coupled with the staining pattern is consistent with the above diagnosis. Dr. Colonel Bald is in agreement that the tumor extends through muscularis propria (pT3). (RH:ecj 04/03/2013)   ASSESSMENT    57 year old female with  #1 new diagnosis of stage 0 ductal carcinoma in situ of the left breast measuring 3.5 cm ER/PR positive. Patient is status post lumpectomy. She does have a close margin. If she will require a reexcision given the close margin.  #2 patient and I discussed adjuvant treatment of DCIS with antiestrogen therapy. We discussed role of tamoxifen in this setting. We discussed the side effects of this as well today.  #3 patient will require radiation therapy post lumpectomy and I have referred her to Dr. Dorothy Puffer.  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 We will need to continue to follow this as well.  Clinical Trial Eligibility:no Multidisciplinary conference discussion no     PLAN:    #1 patient will be referred to radiation oncology.  #2 when she completes her radiation I will plan on starting her on antiestrogen therapy with tamoxifen.  #3 for her neuroendocrine tumor I will plan on getting staging scans performed on her at  some point. I do not believe she needs active treatment at this time. But we will make a final decision after her scans.  #4 I will see her back in a few weeks time      Discussion: Patient is being treated per NCCN breast cancer care guidelines appropriate for stage.0 for breast cancer Stage II for neuroendocrine tumor   Thank you so much for allowing me to participate in the care of Sheanna B Goatley. I will continue to follow up the patient with you and assist in her care.  All questions were answered. The patient knows to call the clinic with any problems, questions or concerns. We can certainly see the patient much sooner if necessary.  I spent 55 minutes counseling the patient face to face. The total time spent in the appointment was 60 minutes.  Drue Second, MD Medical/Oncology [facility] [phone] (beeper) [phone] (Office)

## 2013-06-03 ENCOUNTER — Encounter (HOSPITAL_COMMUNITY): Payer: Self-pay

## 2013-06-03 ENCOUNTER — Encounter (HOSPITAL_COMMUNITY)
Admission: RE | Admit: 2013-06-03 | Discharge: 2013-06-03 | Disposition: A | Discharge status: Home / Self Care | Payer: Medicaid Other | Source: Ambulatory Visit | Attending: Surgery | Admitting: Surgery

## 2013-06-03 HISTORY — DX: Other complications of anesthesia, initial encounter: T88.59XA

## 2013-06-03 HISTORY — DX: Adverse effect of unspecified anesthetic, initial encounter: T41.45XA

## 2013-06-03 HISTORY — DX: Personal history of other medical treatment: Z92.89

## 2013-06-03 HISTORY — DX: Pain, unspecified: R52

## 2013-06-03 LAB — COMPREHENSIVE METABOLIC PANEL
ALT: 16 U/L (ref 0–35)
Alkaline Phosphatase: 66 U/L (ref 39–117)
BUN: 9 mg/dL (ref 6–23)
CO2: 27 mEq/L (ref 19–32)
GFR calc Af Amer: 80 mL/min — ABNORMAL LOW (ref >90)
GFR calc non Af Amer: 69 mL/min — ABNORMAL LOW (ref >90)
Glucose, Bld: 71 mg/dL (ref 70–99)
Potassium: 3.7 mEq/L (ref 3.5–5.1)
Total Protein: 6.8 g/dL (ref 6.0–8.3)

## 2013-06-03 LAB — CBC
HCT: 39.4 % (ref 36.0–46.0)
Platelets: 205 10*3/uL (ref 150–400)
RDW: 14.1 % (ref 11.5–15.5)
WBC: 7.5 10*3/uL (ref 4.0–10.5)

## 2013-06-03 NOTE — Progress Notes (Signed)
Pt. Denies chest pain, difficulty breathing, SOB etc.

## 2013-06-03 NOTE — Pre-Procedure Instructions (Signed)
[name]  06/03/2013   Your procedure is scheduled on:  06/05/2013  Report to [facility] tower, Entrance A at 5:30 AM.  Call this number if you have problems the morning of surgery: [phone]   Remember:   Do not eat food or drink liquids after midnight. On WEDNESDAY  Take these medicines the morning of surgery with A SIP OF WATER: pain medicine is ok if needed, also take Xanax if needed    Do not wear jewelry, make-up or nail polish.  Do not wear lotions, powders, or perfumes. You may wear deodorant.  Do not shave 48 hours prior to surgery.  Do not bring valuables to the hospital.  [facility] is not responsible                   for any belongings or valuables.  Contacts, dentures or bridgework may not be worn into surgery.  Leave suitcase in the car. After surgery it may be brought to your room.  For patients admitted to the hospital, checkout time is 11:00 AM the day of  discharge.   Patients discharged the day of surgery will not be allowed to drive  home.  Name and phone number of your driver: with relative   Special Instructions: Shower using CHG 2 nights before surgery and the night before surgery.  If you shower the day of surgery use CHG.  Use special wash - you have one bottle of CHG for all showers.  You should use approximately 1/3 of the bottle for each shower.   Please read over the following fact sheets that you were given: Pain Booklet, Coughing and Deep Breathing and Surgical Site Infection Prevention

## 2013-06-04 MED ORDER — CEFAZOLIN SODIUM-DEXTROSE 2-3 GM-% IV SOLR
2.0000 g | INTRAVENOUS | Status: AC
  Administered 2013-06-05: 2 g via INTRAVENOUS
  Filled 2013-06-04: qty 50

## 2013-06-04 NOTE — H&P (Signed)
[name] is an 57 y.o. female.   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 At the same time she had an enlarging fibroadenoma of left breast. This was removed at the same time. The final pathology also showed DCIS. Margins were negative but close. She now presents for reexcision and sentinel node biopsy. She is doing well and is without complaints  Past Medical History  Diagnosis Date  . Hepatitis C   . Jaundice   . Breast disorder     lump in left breast  . TIA (transient ischemic attack) OCT 2012  . Hyperlipidemia   . Varicose veins   . Adjustment disorder with mixed anxiety and depressed mood 01/05/2013  . Allergy   . Anxiety   . Arthritis     knees,wrists   . Malignant neoplasm of lower-outer quadrant of female breast 05/19/2013  . Pain management     goes to [facility] clinic, but she doesn't intend to return   . Complication of anesthesia     hallucinations   . H/O echocardiogram     not finding report in EPIC, pt. reports that it was before her appt. /w Dr. Eden Emms   . Stroke 2012    TIA  . Brain aneurysm 2012    2cms.    Past Surgical History  Procedure Laterality Date  . Laparoscopy N/A 04/02/2013    Procedure: Diaganostic Lap.; Small Bowel Resection;  Surgeon: Shelly Rubenstein, MD;  Location: [facility] OR;  Service: General;  Laterality: N/A;  . Breast lumpectomy Left 04/02/2013    Procedure: Excision  of Left Breast Mass;  Surgeon: Shelly Rubenstein, MD;  Location: [facility] OR;  Service: General;  Laterality: Left;  . Colon surgery  03/2013    bowel obstruction , malignant tumor    . Vaginal births   3     birthed twins at -[redacted] weeks gestation - both survived , total of 3 pregnancies - all born vaginally    Family History  Problem Relation Age of Onset  . Diabetes Paternal Grandfather   . Diabetes Paternal Grandmother   . Cancer Father     lung  .  Diabetes Mother   . Heart disease Mother   . Cancer Mother     cervical  . Diabetes Brother   . Stroke Brother   . Heart disease Sister   . Diabetes Sister   . Cancer Sister     cervical   Social History:  reports that she has been smoking.  She has never used smokeless tobacco. She reports that  drinks alcohol. She reports that she does not use illicit drugs.  Allergies:  Allergies  Allergen Reactions  . Statins Swelling    Stomach swelling & muscle ache  . Ace Inhibitors Other (See Comments)    Other reaction(s): Palpitations  . Ciprofloxacin Hcl Palpitations    No prescriptions prior to admission    Results for orders placed during the hospital encounter of 06/03/13 (from the past 48 hour(s))  COMPREHENSIVE METABOLIC PANEL     Status: Abnormal   Collection Time    06/03/13  3:49 PM      Result Value Range   Sodium 141  135 - 145 mEq/L   Potassium 3.7  3.5 - 5.1 mEq/L   Chloride 104  96 - 112 mEq/L   CO2 27  19 - 32 mEq/L  Glucose, Bld 71  70 - 99 mg/dL   BUN 9  6 - 23 mg/dL   Creatinine, Ser 6.44  0.50 - 1.10 mg/dL   Calcium 9.1  8.4 - 03.4 mg/dL   Total Protein 6.8  6.0 - 8.3 g/dL   Albumin 3.7  3.5 - 5.2 g/dL   AST 15  0 - 37 U/L   ALT 16  0 - 35 U/L   Alkaline Phosphatase 66  39 - 117 U/L   Total Bilirubin 0.3  0.3 - 1.2 mg/dL   GFR calc non Af Amer 69 (*) >90 mL/min   GFR calc Af Amer 80 (*) >90 mL/min   Comment: (NOTE)     The eGFR has been calculated using the CKD EPI equation.     This calculation has not been validated in all clinical situations.     eGFR's persistently <90 mL/min signify possible Chronic Kidney     Disease.  CBC     Status: None   Collection Time    06/03/13  3:49 PM      Result Value Range   WBC 7.5  4.0 - 10.5 K/uL   RBC 4.58  3.87 - 5.11 MIL/uL   Hemoglobin 13.6  12.0 - 15.0 g/dL   HCT 74.2  59.5 - 63.8 %   MCV 86.0  78.0 - 100.0 fL   MCH 29.7  26.0 - 34.0 pg   MCHC 34.5  30.0 - 36.0 g/dL   RDW 75.6  43.3 - 29.5 %    Platelets 205  150 - 400 K/uL   No results found.  Review of Systems  All other systems reviewed and are negative.    There were no vitals taken for this visit. Physical Exam  Constitutional: She is oriented to person, place, and time. She appears well-developed and well-nourished. No distress.  HENT:  Head: Normocephalic and atraumatic.  Eyes: Conjunctivae are normal. Pupils are equal, round, and reactive to light.  Neck: Normal range of motion. Neck supple. No tracheal deviation present.  Cardiovascular: Normal rate, regular rhythm and normal heart sounds.   Respiratory: Effort normal and breath sounds normal. No respiratory distress.  GI: She exhibits no distension. There is no tenderness.  Musculoskeletal: Normal range of motion.  Neurological: She is alert and oriented to person, place, and time.  Skin: Skin is warm and dry. No rash noted. No erythema.  Psychiatric: Her behavior is normal. Judgment normal.   Breasts: Excision of the left breast is well-healed  Assessment/Plan Left breast DCIS  She now presents for reexcision of the lumpectomy site and sentinel biopsy. I discussed the risks with her which includes but not limited to bleeding, infection, need for further surgery if the margins are positive, seroma formation, etc. She understands and wishes to proceed. Surgery is scheduled  Kearie Mennen A 06/04/2013, 11:33 AM

## 2013-06-05 ENCOUNTER — Ambulatory Visit (HOSPITAL_COMMUNITY)
Admit: 2013-06-05 | Discharge: 2013-06-05 | Disposition: A | Discharge status: Home / Self Care | Payer: Medicaid Other | Attending: Surgery | Admitting: Surgery

## 2013-06-05 ENCOUNTER — Ambulatory Visit (HOSPITAL_COMMUNITY): Payer: Medicaid Other | Admitting: Certified Registered Nurse Anesthetist

## 2013-06-05 ENCOUNTER — Encounter (HOSPITAL_COMMUNITY): Disposition: A | Discharge status: Home / Self Care | Payer: Self-pay | Attending: Surgery

## 2013-06-05 ENCOUNTER — Telehealth (INDEPENDENT_AMBULATORY_CARE_PROVIDER_SITE_OTHER): Payer: Self-pay | Admitting: General Surgery

## 2013-06-05 ENCOUNTER — Encounter (HOSPITAL_COMMUNITY): Payer: Self-pay

## 2013-06-05 ENCOUNTER — Encounter (HOSPITAL_COMMUNITY): Payer: Self-pay | Admitting: Anesthesiology

## 2013-06-05 ENCOUNTER — Ambulatory Visit (HOSPITAL_COMMUNITY)
Admission: RE | Admit: 2013-06-05 | Discharge: 2013-06-05 | Disposition: A | Discharge status: Home / Self Care | Payer: Medicaid Other | Source: Ambulatory Visit | Attending: Surgery | Admitting: Surgery

## 2013-06-05 ENCOUNTER — Encounter (HOSPITAL_COMMUNITY): Payer: Self-pay | Admitting: Certified Registered Nurse Anesthetist

## 2013-06-05 ENCOUNTER — Ambulatory Visit (HOSPITAL_COMMUNITY): Payer: Medicaid Other | Admitting: Anesthesiology

## 2013-06-05 ENCOUNTER — Encounter (HOSPITAL_COMMUNITY)
Admission: RE | Disposition: A | Discharge status: Home / Self Care | Payer: Self-pay | Source: Ambulatory Visit | Attending: Surgery

## 2013-06-05 ENCOUNTER — Encounter (HOSPITAL_COMMUNITY)
Admission: RE | Admit: 2013-06-05 | Discharge: 2013-06-05 | Disposition: A | Discharge status: Home / Self Care | Payer: Medicaid Other | Source: Ambulatory Visit | Attending: Surgery | Admitting: Surgery

## 2013-06-05 DIAGNOSIS — C50912 Malignant neoplasm of unspecified site of left female breast: Secondary | ICD-10-CM

## 2013-06-05 DIAGNOSIS — D0590 Unspecified type of carcinoma in situ of unspecified breast: Secondary | ICD-10-CM

## 2013-06-05 DIAGNOSIS — IMO0002 Reserved for concepts with insufficient information to code with codable children: Secondary | ICD-10-CM | POA: Insufficient documentation

## 2013-06-05 DIAGNOSIS — D249 Benign neoplasm of unspecified breast: Secondary | ICD-10-CM | POA: Insufficient documentation

## 2013-06-05 DIAGNOSIS — Z01812 Encounter for preprocedural laboratory examination: Secondary | ICD-10-CM | POA: Insufficient documentation

## 2013-06-05 DIAGNOSIS — Z9049 Acquired absence of other specified parts of digestive tract: Secondary | ICD-10-CM | POA: Insufficient documentation

## 2013-06-05 DIAGNOSIS — C50919 Malignant neoplasm of unspecified site of unspecified female breast: Secondary | ICD-10-CM

## 2013-06-05 DIAGNOSIS — Y839 Surgical procedure, unspecified as the cause of abnormal reaction of the patient, or of later complication, without mention of misadventure at the time of the procedure: Secondary | ICD-10-CM | POA: Insufficient documentation

## 2013-06-05 HISTORY — PX: BREAST LUMPECTOMY WITH SENTINEL LYMPH NODE BIOPSY: SHX5597

## 2013-06-05 HISTORY — PX: EVACUATION BREAST HEMATOMA: SHX1537

## 2013-06-05 HISTORY — DX: Malignant neoplasm of unspecified site of unspecified female breast: C50.919

## 2013-06-05 SURGERY — EVACUATION, HEMATOMA, BREAST
Anesthesia: General | Site: Breast | Laterality: Left | Wound class: Clean

## 2013-06-05 SURGERY — BREAST LUMPECTOMY WITH SENTINEL LYMPH NODE BX
Anesthesia: General | Laterality: Left | Wound class: Clean

## 2013-06-05 MED ORDER — PROPOFOL 10 MG/ML IV BOLUS
INTRAVENOUS | Status: DC | PRN
  Administered 2013-06-05: 200 mg via INTRAVENOUS

## 2013-06-05 MED ORDER — 0.9 % SODIUM CHLORIDE (POUR BTL) OPTIME
TOPICAL | Status: DC | PRN
  Administered 2013-06-05: 1000 mL

## 2013-06-05 MED ORDER — ONDANSETRON HCL 4 MG/2ML IJ SOLN: 4.0000 mg | Freq: Once | INTRAMUSCULAR | Status: DC | PRN

## 2013-06-05 MED ORDER — OXYCODONE HCL 5 MG/5ML PO SOLN: 5.0000 mg | Freq: Once | ORAL | Status: AC | PRN

## 2013-06-05 MED ORDER — TRAMADOL HCL 50 MG PO TABS: 50.0000 mg | ORAL_TABLET | Freq: Four times a day (QID) | ORAL | Status: DC | PRN

## 2013-06-05 MED ORDER — MIDAZOLAM HCL 2 MG/2ML IJ SOLN: 0.5000 mg | Freq: Once | INTRAMUSCULAR | Status: DC | PRN

## 2013-06-05 MED ORDER — KETOROLAC TROMETHAMINE 30 MG/ML IJ SOLN
INTRAMUSCULAR | Status: DC | PRN
  Administered 2013-06-05: 30 mg via INTRAVENOUS

## 2013-06-05 MED ORDER — OXYCODONE HCL 5 MG PO TABS
ORAL_TABLET | ORAL | Status: AC
  Filled 2013-06-05: qty 1

## 2013-06-05 MED ORDER — ONDANSETRON HCL 4 MG/2ML IJ SOLN
INTRAMUSCULAR | Status: DC | PRN
  Administered 2013-06-05: 4 mg via INTRAVENOUS

## 2013-06-05 MED ORDER — LACTATED RINGERS IV SOLN
INTRAVENOUS | Status: DC | PRN
  Administered 2013-06-05 (×2): via INTRAVENOUS

## 2013-06-05 MED ORDER — MIDAZOLAM HCL 5 MG/5ML IJ SOLN
INTRAMUSCULAR | Status: DC | PRN
  Administered 2013-06-05: 2 mg via INTRAVENOUS

## 2013-06-05 MED ORDER — HYDROMORPHONE HCL PF 1 MG/ML IJ SOLN
INTRAMUSCULAR | Status: AC
  Filled 2013-06-05: qty 1

## 2013-06-05 MED ORDER — HYDROMORPHONE HCL PF 1 MG/ML IJ SOLN
0.2500 mg | INTRAMUSCULAR | Status: DC | PRN
  Administered 2013-06-05 (×2): 0.5 mg via INTRAVENOUS

## 2013-06-05 MED ORDER — HYDROCODONE-ACETAMINOPHEN 5-325 MG PO TABS: 1.0000 | ORAL_TABLET | Freq: Four times a day (QID) | ORAL | Status: DC | PRN

## 2013-06-05 MED ORDER — BUPIVACAINE-EPINEPHRINE PF 0.5-1:200000 % IJ SOLN
INTRAMUSCULAR | Status: DC | PRN
  Administered 2013-06-05: 30 mL

## 2013-06-05 MED ORDER — SUCCINYLCHOLINE CHLORIDE 20 MG/ML IJ SOLN
INTRAMUSCULAR | Status: DC | PRN
  Administered 2013-06-05: 100 mg via INTRAVENOUS

## 2013-06-05 MED ORDER — SODIUM CHLORIDE 0.9 % IJ SOLN
INTRAMUSCULAR | Status: AC
  Filled 2013-06-05: qty 10

## 2013-06-05 MED ORDER — FENTANYL CITRATE 0.05 MG/ML IJ SOLN
INTRAMUSCULAR | Status: DC | PRN
  Administered 2013-06-05: 100 ug via INTRAVENOUS

## 2013-06-05 MED ORDER — CEFAZOLIN SODIUM 1-5 GM-% IV SOLN
INTRAVENOUS | Status: AC
  Filled 2013-06-05: qty 100

## 2013-06-05 MED ORDER — OXYCODONE HCL 5 MG PO TABS
5.0000 mg | ORAL_TABLET | ORAL | Status: DC | PRN
  Administered 2013-06-05: 5 mg via ORAL

## 2013-06-05 MED ORDER — CEFAZOLIN SODIUM-DEXTROSE 2-3 GM-% IV SOLR: 2.0000 g | INTRAVENOUS | Status: DC

## 2013-06-05 MED ORDER — LIDOCAINE HCL (CARDIAC) 20 MG/ML IV SOLN
INTRAVENOUS | Status: DC | PRN
  Administered 2013-06-05: 60 mg via INTRAVENOUS

## 2013-06-05 MED ORDER — OXYCODONE HCL 5 MG PO TABS
5.0000 mg | ORAL_TABLET | Freq: Once | ORAL | Status: AC | PRN
  Administered 2013-06-05: 5 mg via ORAL

## 2013-06-05 MED ORDER — PHENYLEPHRINE HCL 10 MG/ML IJ SOLN
INTRAMUSCULAR | Status: DC | PRN
  Administered 2013-06-05: 80 ug via INTRAVENOUS
  Administered 2013-06-05 (×2): 40 ug via INTRAVENOUS

## 2013-06-05 MED ORDER — LIDOCAINE HCL (CARDIAC) 20 MG/ML IV SOLN
INTRAVENOUS | Status: DC | PRN
  Administered 2013-06-05: 40 mg via INTRAVENOUS

## 2013-06-05 MED ORDER — ARTIFICIAL TEARS OP OINT
TOPICAL_OINTMENT | OPHTHALMIC | Status: DC | PRN
  Administered 2013-06-05: 1 via OPHTHALMIC

## 2013-06-05 MED ORDER — METHYLENE BLUE 1 % INJ SOLN
INTRAMUSCULAR | Status: DC | PRN
  Administered 2013-06-05: 2 mL via SUBMUCOSAL

## 2013-06-05 MED ORDER — TECHNETIUM TC 99M SULFUR COLLOID FILTERED
1.0000 | Freq: Once | INTRAVENOUS | Status: AC | PRN
  Administered 2013-06-05: 1 via INTRADERMAL

## 2013-06-05 MED ORDER — FENTANYL CITRATE 0.05 MG/ML IJ SOLN
INTRAMUSCULAR | Status: DC | PRN
  Administered 2013-06-05 (×2): 50 ug via INTRAVENOUS
  Administered 2013-06-05: 100 ug via INTRAVENOUS
  Administered 2013-06-05: 50 ug via INTRAVENOUS

## 2013-06-05 MED ORDER — BUPIVACAINE-EPINEPHRINE (PF) 0.5% -1:200000 IJ SOLN
INTRAMUSCULAR | Status: AC
  Filled 2013-06-05: qty 10

## 2013-06-05 MED ORDER — PROMETHAZINE HCL 25 MG/ML IJ SOLN: 6.2500 mg | INTRAMUSCULAR | Status: DC | PRN

## 2013-06-05 MED ORDER — HYDROMORPHONE HCL PF 1 MG/ML IJ SOLN
0.2500 mg | INTRAMUSCULAR | Status: DC | PRN
  Administered 2013-06-05: 0.5 mg via INTRAVENOUS

## 2013-06-05 MED ORDER — SODIUM CHLORIDE 0.9 % IJ SOLN
INTRAMUSCULAR | Status: DC | PRN
  Administered 2013-06-05: 3 mL via INTRAVENOUS

## 2013-06-05 MED ORDER — LACTATED RINGERS IV SOLN
INTRAVENOUS | Status: DC | PRN
  Administered 2013-06-05: 12:00:00 via INTRAVENOUS

## 2013-06-05 MED ORDER — MEPERIDINE HCL 25 MG/ML IJ SOLN: 6.2500 mg | INTRAMUSCULAR | Status: DC | PRN

## 2013-06-05 MED ORDER — METHYLENE BLUE 1 % INJ SOLN
INTRAMUSCULAR | Status: AC
  Filled 2013-06-05: qty 10

## 2013-06-05 MED ORDER — BUPIVACAINE-EPINEPHRINE PF 0.25-1:200000 % IJ SOLN
INTRAMUSCULAR | Status: AC
  Filled 2013-06-05: qty 30

## 2013-06-05 MED ORDER — CEFAZOLIN SODIUM-DEXTROSE 2-3 GM-% IV SOLR
2.0000 g | Freq: Once | INTRAVENOUS | Status: AC
  Administered 2013-06-05: 2 g via INTRAVENOUS

## 2013-06-05 MED ORDER — BUPIVACAINE-EPINEPHRINE 0.25% -1:200000 IJ SOLN
INTRAMUSCULAR | Status: DC | PRN
  Administered 2013-06-05: 20 mL

## 2013-06-05 SURGICAL SUPPLY — 49 items
ADH SKN CLS APL DERMABOND .7 (GAUZE/BANDAGES/DRESSINGS)
APL SKNCLS STERI-STRIP NONHPOA (GAUZE/BANDAGES/DRESSINGS) ×1
BENZOIN TINCTURE PRP APPL 2/3 (GAUZE/BANDAGES/DRESSINGS) ×1 IMPLANT
BINDER BREAST LRG (GAUZE/BANDAGES/DRESSINGS) IMPLANT
BINDER BREAST XLRG (GAUZE/BANDAGES/DRESSINGS) IMPLANT
BLADE SURG 15 STRL LF DISP TIS (BLADE) ×1 IMPLANT
BLADE SURG 15 STRL SS (BLADE) ×2
CANISTER SUCTION 2500CC (MISCELLANEOUS) IMPLANT
CHLORAPREP W/TINT 26ML (MISCELLANEOUS) ×2 IMPLANT
CLOTH BEACON ORANGE TIMEOUT ST (SAFETY) ×2 IMPLANT
CLSR STERI-STRIP ANTIMIC 1/2X4 (GAUZE/BANDAGES/DRESSINGS) ×1 IMPLANT
CONT SPEC 4OZ CLIKSEAL STRL BL (MISCELLANEOUS) IMPLANT
COVER SURGICAL LIGHT HANDLE (MISCELLANEOUS) ×2 IMPLANT
DECANTER SPIKE VIAL GLASS SM (MISCELLANEOUS) ×2 IMPLANT
DERMABOND ADVANCED (GAUZE/BANDAGES/DRESSINGS)
DERMABOND ADVANCED .7 DNX12 (GAUZE/BANDAGES/DRESSINGS) ×1 IMPLANT
DEVICE DUBIN SPECIMEN MAMMOGRA (MISCELLANEOUS) IMPLANT
DRAPE PED LAPAROTOMY (DRAPES) ×2 IMPLANT
DRSG TEGADERM 2-3/8X2-3/4 SM (GAUZE/BANDAGES/DRESSINGS) ×1 IMPLANT
DRSG TEGADERM 4X4.75 (GAUZE/BANDAGES/DRESSINGS) ×1 IMPLANT
ELECT CAUTERY BLADE 6.4 (BLADE) ×2 IMPLANT
ELECT REM PT RETURN 9FT ADLT (ELECTROSURGICAL) ×2
ELECTRODE REM PT RTRN 9FT ADLT (ELECTROSURGICAL) ×1 IMPLANT
GAUZE SPONGE 4X4 16PLY XRAY LF (GAUZE/BANDAGES/DRESSINGS) ×2 IMPLANT
GLOVE BIOGEL PI IND STRL 6.5 (GLOVE) IMPLANT
GLOVE BIOGEL PI INDICATOR 6.5 (GLOVE) ×3
GLOVE ECLIPSE 6.5 STRL STRAW (GLOVE) ×1 IMPLANT
GLOVE SURG SIGNA 7.5 PF LTX (GLOVE) ×2 IMPLANT
GOWN STRL NON-REIN LRG LVL3 (GOWN DISPOSABLE) ×2 IMPLANT
GOWN STRL REIN XL XLG (GOWN DISPOSABLE) ×2 IMPLANT
KIT BASIN OR (CUSTOM PROCEDURE TRAY) ×2 IMPLANT
KIT MARKER MARGIN INK (KITS) IMPLANT
KIT ROOM TURNOVER OR (KITS) ×2 IMPLANT
NDL HYPO 25GX1X1/2 BEV (NEEDLE) ×1 IMPLANT
NEEDLE HYPO 25GX1X1/2 BEV (NEEDLE) ×2 IMPLANT
NS IRRIG 1000ML POUR BTL (IV SOLUTION) ×2 IMPLANT
PACK SURGICAL SETUP 50X90 (CUSTOM PROCEDURE TRAY) ×2 IMPLANT
PAD ARMBOARD 7.5X6 YLW CONV (MISCELLANEOUS) ×2 IMPLANT
PENCIL BUTTON HOLSTER BLD 10FT (ELECTRODE) ×2 IMPLANT
SPONGE GAUZE 4X4 12PLY (GAUZE/BANDAGES/DRESSINGS) ×1 IMPLANT
SUT MNCRL AB 4-0 PS2 18 (SUTURE) ×2 IMPLANT
SUT VIC AB 3-0 SH 27 (SUTURE) ×2
SUT VIC AB 3-0 SH 27X BRD (SUTURE) ×1 IMPLANT
SYR BULB 3OZ (MISCELLANEOUS) ×2 IMPLANT
SYR CONTROL 10ML LL (SYRINGE) ×2 IMPLANT
TOWEL OR 17X24 6PK STRL BLUE (TOWEL DISPOSABLE) ×2 IMPLANT
TOWEL OR 17X26 10 PK STRL BLUE (TOWEL DISPOSABLE) ×2 IMPLANT
TUBE CONNECTING 12X1/4 (SUCTIONS) IMPLANT
YANKAUER SUCT BULB TIP NO VENT (SUCTIONS) ×1 IMPLANT

## 2013-06-05 SURGICAL SUPPLY — 48 items
APL SKNCLS STERI-STRIP NONHPOA (GAUZE/BANDAGES/DRESSINGS) ×1
APPLIER CLIP 9.375 MED OPEN (MISCELLANEOUS)
APR CLP MED 9.3 20 MLT OPN (MISCELLANEOUS)
BENZOIN TINCTURE PRP APPL 2/3 (GAUZE/BANDAGES/DRESSINGS) ×2 IMPLANT
BINDER BREAST LRG (GAUZE/BANDAGES/DRESSINGS) IMPLANT
BINDER BREAST XLRG (GAUZE/BANDAGES/DRESSINGS) IMPLANT
BLADE SURG 15 STRL LF DISP TIS (BLADE) ×1 IMPLANT
BLADE SURG 15 STRL SS (BLADE) ×2
CANISTER SUCTION 2500CC (MISCELLANEOUS) ×2 IMPLANT
CHLORAPREP W/TINT 26ML (MISCELLANEOUS) ×2 IMPLANT
CLIP APPLIE 9.375 MED OPEN (MISCELLANEOUS) IMPLANT
CLOTH BEACON ORANGE TIMEOUT ST (SAFETY) ×2 IMPLANT
CONT SPEC 4OZ CLIKSEAL STRL BL (MISCELLANEOUS) ×2 IMPLANT
COVER PROBE W GEL 5X96 (DRAPES) ×2 IMPLANT
COVER SURGICAL LIGHT HANDLE (MISCELLANEOUS) ×2 IMPLANT
DEVICE DUBIN SPECIMEN MAMMOGRA (MISCELLANEOUS) IMPLANT
DRAPE LAPAROSCOPIC ABDOMINAL (DRAPES) ×2 IMPLANT
DRSG TEGADERM 4X4.75 (GAUZE/BANDAGES/DRESSINGS) ×2 IMPLANT
ELECT CAUTERY BLADE 6.4 (BLADE) ×2 IMPLANT
ELECT REM PT RETURN 9FT ADLT (ELECTROSURGICAL) ×2
ELECTRODE REM PT RTRN 9FT ADLT (ELECTROSURGICAL) ×1 IMPLANT
GLOVE SURG SIGNA 7.5 PF LTX (GLOVE) ×2 IMPLANT
GOWN STRL NON-REIN LRG LVL3 (GOWN DISPOSABLE) ×2 IMPLANT
GOWN STRL REIN XL XLG (GOWN DISPOSABLE) ×2 IMPLANT
KIT BASIN OR (CUSTOM PROCEDURE TRAY) ×2 IMPLANT
KIT MARKER MARGIN INK (KITS) IMPLANT
KIT ROOM TURNOVER OR (KITS) ×2 IMPLANT
NDL 18GX1X1/2 (RX/OR ONLY) (NEEDLE) ×1 IMPLANT
NDL HYPO 25GX1X1/2 BEV (NEEDLE) ×2 IMPLANT
NEEDLE 18GX1X1/2 (RX/OR ONLY) (NEEDLE) ×2 IMPLANT
NEEDLE HYPO 25GX1X1/2 BEV (NEEDLE) ×4 IMPLANT
NS IRRIG 1000ML POUR BTL (IV SOLUTION) ×2 IMPLANT
PACK SURGICAL SETUP 50X90 (CUSTOM PROCEDURE TRAY) ×2 IMPLANT
PAD ARMBOARD 7.5X6 YLW CONV (MISCELLANEOUS) ×2 IMPLANT
PENCIL BUTTON HOLSTER BLD 10FT (ELECTRODE) ×2 IMPLANT
SPONGE GAUZE 4X4 12PLY (GAUZE/BANDAGES/DRESSINGS) ×2 IMPLANT
SPONGE LAP 4X18 X RAY DECT (DISPOSABLE) ×2 IMPLANT
STRIP CLOSURE SKIN 1/2X4 (GAUZE/BANDAGES/DRESSINGS) ×2 IMPLANT
SUT MON AB 4-0 PC3 18 (SUTURE) ×2 IMPLANT
SUT SILK 2 0 SH (SUTURE) IMPLANT
SUT VIC AB 3-0 SH 27 (SUTURE) ×2
SUT VIC AB 3-0 SH 27XBRD (SUTURE) ×1 IMPLANT
SYR BULB 3OZ (MISCELLANEOUS) ×2 IMPLANT
SYR CONTROL 10ML LL (SYRINGE) ×4 IMPLANT
TOWEL OR 17X24 6PK STRL BLUE (TOWEL DISPOSABLE) ×2 IMPLANT
TOWEL OR 17X26 10 PK STRL BLUE (TOWEL DISPOSABLE) ×2 IMPLANT
TUBE CONNECTING 12X1/4 (SUCTIONS) ×2 IMPLANT
YANKAUER SUCT BULB TIP NO VENT (SUCTIONS) ×2 IMPLANT

## 2013-06-05 NOTE — Preoperative (Signed)
Beta Blockers   Reason not to administer Beta Blockers:Not Applicable 

## 2013-06-05 NOTE — Telephone Encounter (Signed)
[name], pharmacist at [facility], called to confirm Ultram and Norco ordered today by Dr. Magnus Ivan.  Pt is seen by Pain Management and has a narcotic contract with them.  Paged and updated Dr. Magnus Ivan; stated okay to fill the Norco.  Pt has apparently already picked up the Ultram before the pharmacist noted the narcotic contract existing.  Asked the pharmacy to admonish the pt not to take with Ultram, if she will comply.  [name] made aware of all and will fill the Norco.

## 2013-06-05 NOTE — Transfer of Care (Signed)
Immediate Anesthesia Transfer of Care Note  Patient: [name]  Procedure(s) Performed: Procedure(s): BREAST LUMPECTOMY WITH SENTINEL LYMPH NODE BX (Left)  Patient Location: PACU  Anesthesia Type:General  Level of Consciousness: awake, alert , oriented and sedated  Airway & Oxygen Therapy: Patient Spontanous Breathing and Patient connected to nasal cannula oxygen  Post-op Assessment: Report given to PACU RN, Post -op Vital signs reviewed and stable and Patient moving all extremities  Post vital signs: Reviewed and stable  Complications: No apparent anesthesia complications

## 2013-06-05 NOTE — Discharge Instructions (Signed)
[facility] °Office Phone Number [phone] ° °BREAST BIOPSY/ PARTIAL MASTECTOMY: POST OP INSTRUCTIONS ° °Always review your discharge instruction sheet given to you by the facility where your surgery was performed. ° °IF YOU HAVE DISABILITY OR FAMILY LEAVE FORMS, YOU MUST BRING THEM TO THE OFFICE FOR PROCESSING.  DO NOT GIVE THEM TO YOUR DOCTOR. ° °1. A prescription for pain medication may be given to you upon discharge.  Take your pain medication as prescribed, if needed.  If narcotic pain medicine is not needed, then you may take acetaminophen (Tylenol) or ibuprofen (Advil) as needed. °2. Take your usually prescribed medications unless otherwise directed °3. If you need a refill on your pain medication, please contact your pharmacy.  They will contact our office to request authorization.  Prescriptions will not be filled after 5pm or on week-ends. °4. You should eat very light the first 24 hours after surgery, such as soup, crackers, pudding, etc.  Resume your normal diet the day after surgery. °5. Most patients will experience some swelling and bruising in the breast.  Ice packs and a good support bra will help.  Swelling and bruising can take several days to resolve.  °6. It is common to experience some constipation if taking pain medication after surgery.  Increasing fluid intake and taking a stool softener will usually help or prevent this problem from occurring.  A mild laxative (Milk of Magnesia or Miralax) should be taken according to package directions if there are no bowel movements after 48 hours. °7. Unless discharge instructions indicate otherwise, you may remove your bandages 24-48 hours after surgery, and you may shower at that time.  You may have steri-strips (small skin tapes) in place directly over the incision.  These strips should be left on the skin for 7-10 days.  If your surgeon used skin glue on the incision, you may shower in 24 hours.  The glue will flake off over the  next 2-3 weeks.  Any sutures or staples will be removed at the office during your follow-up visit. °8. ACTIVITIES:  You may resume regular daily activities (gradually increasing) beginning the next day.  Wearing a good support bra or sports bra minimizes pain and swelling.  You may have sexual intercourse when it is comfortable. °a. You may drive when you no longer are taking prescription pain medication, you can comfortably wear a seatbelt, and you can safely maneuver your car and apply brakes. °b. RETURN TO WORK:  ______________________________________________________________________________________ °9. You should see your doctor in the office for a follow-up appointment approximately two weeks after your surgery.  Your doctor’s nurse will typically make your follow-up appointment when she calls you with your pathology report.  Expect your pathology report 2-3 business days after your surgery.  You may call to check if you do not hear from us after three days. °10. OTHER INSTRUCTIONS: _______________________________________________________________________________________________ _____________________________________________________________________________________________________________________________________ °_____________________________________________________________________________________________________________________________________ °_____________________________________________________________________________________________________________________________________ ° °WHEN TO CALL YOUR DOCTOR: °1. Fever over 101.0 °2. Nausea and/or vomiting. °3. Extreme swelling or bruising. °4. Continued bleeding from incision. °5. Increased pain, redness, or drainage from the incision. ° °The clinic staff is available to answer your questions during regular business hours.  Please don’t hesitate to call and ask to speak to one of the nurses for clinical concerns.  If you have a medical emergency, go to the nearest  emergency room or call 911.  A surgeon from [facility] is always on call at the hospital. ° °For further questions, please visit [email or web address]  °

## 2013-06-05 NOTE — Anesthesia Postprocedure Evaluation (Signed)
  Anesthesia Post-op Note  Patient: [name]  Procedure(s) Performed: Procedure(s): EVACUATION HEMATOMA BREAST (Left)  Patient Location: PACU  Anesthesia Type:General  Level of Consciousness: awake, alert , oriented and patient cooperative  Airway and Oxygen Therapy: Patient Spontanous Breathing  Post-op Pain: mild  Post-op Assessment: Post-op Vital signs reviewed, Patient's Cardiovascular Status Stable, Respiratory Function Stable, Patent Airway, No signs of Nausea or vomiting and Pain level controlled  Post-op Vital Signs: Reviewed and stable  Complications: No apparent anesthesia complications

## 2013-06-05 NOTE — Anesthesia Postprocedure Evaluation (Signed)
  Anesthesia Post-op Note  Patient: [name]  Procedure(s) Performed: Procedure(s): BREAST LUMPECTOMY WITH SENTINEL LYMPH NODE BX (Left)  Patient Location: PACU  Anesthesia Type:General  Level of Consciousness: awake, alert , oriented and patient cooperative  Airway and Oxygen Therapy: Patient Spontanous Breathing  Post-op Pain: mild  Post-op Assessment: Post-op Vital signs reviewed, Patient's Cardiovascular Status Stable, Respiratory Function Stable, Patent Airway, No signs of Nausea or vomiting and Pain level controlled  Post-op Vital Signs: stable  Complications: No apparent anesthesia complications

## 2013-06-05 NOTE — H&P (Signed)
[name] is an 57 y.o. female.  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 At the same time she had an enlarging fibroadenoma of left breast. This was removed at the same time. The final pathology also showed DCIS. Margins were negative but close. She now presents for reexcision and sentinel node biopsy. She is doing well and is without complaints  Past Medical History   Diagnosis  Date   .  Hepatitis C    .  Jaundice    .  Breast disorder      lump in left breast   .  TIA (transient ischemic attack)  OCT 2012   .  Hyperlipidemia    .  Varicose veins    .  Adjustment disorder with mixed anxiety and depressed mood  01/05/2013   .  Allergy    .  Anxiety    .  Arthritis      knees,wrists   .  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  Pain management      goes to [facility] clinic, but she doesn't intend to return   .  Complication of anesthesia      hallucinations   .  H/O echocardiogram      not finding report in EPIC, pt. reports that it was before her appt. /w Dr. Eden Emms   .  Stroke  2012     TIA   .  Brain aneurysm  2012     2cms.    Past Surgical History   Procedure  Laterality  Date   .  Laparoscopy  N/A  04/02/2013     Procedure: Diaganostic Lap.; Small Bowel Resection; Surgeon: Shelly Rubenstein, MD; Location: [facility] OR; Service: General; Laterality: N/A;   .  Breast lumpectomy  Left  04/02/2013     Procedure: Excision of Left Breast Mass; Surgeon: Shelly Rubenstein, MD; Location: [facility] OR; Service: General; Laterality: Left;   .  Colon surgery   03/2013     bowel obstruction , malignant tumor   .  Vaginal births   58     birthed twins at -[redacted] weeks gestation - both survived , total of 3 pregnancies - all born vaginally    Family History   Problem  Relation  Age of Onset   .  Diabetes  Paternal Grandfather    .  Diabetes   Paternal Grandmother    .  Cancer  Father      lung   .  Diabetes  Mother    .  Heart disease  Mother    .  Cancer  Mother      cervical   .  Diabetes  Brother    .  Stroke  Brother    .  Heart disease  Sister    .  Diabetes  Sister    .  Cancer  Sister      cervical   Social History: reports that she has been smoking. She has never used smokeless tobacco. She reports that drinks alcohol. She reports that she does not use illicit drugs.  Allergies:  Allergies   Allergen  Reactions   .  Statins  Swelling     Stomach swelling & muscle ache   .  Ace Inhibitors  Other (See Comments)     Other reaction(s): Palpitations   .  Ciprofloxacin Hcl  Palpitations    No prescriptions prior to admission    Results for orders placed during the hospital encounter of 06/03/13 (from the past 48 hour(s))   COMPREHENSIVE METABOLIC PANEL Status: Abnormal    Collection Time    06/03/13 3:49 PM   Result  Value  Range    Sodium  141  135 - 145 mEq/L    Potassium  3.7  3.5 - 5.1 mEq/L    Chloride  104  96 - 112 mEq/L    CO2  27  19 - 32 mEq/L    Glucose, Bld  71  70 - 99 mg/dL    BUN  9  6 - 23 mg/dL    Creatinine, Ser  6.57  0.50 - 1.10 mg/dL    Calcium  9.1  8.4 - 10.5 mg/dL    Total Protein  6.8  6.0 - 8.3 g/dL    Albumin  3.7  3.5 - 5.2 g/dL    AST  15  0 - 37 U/L    ALT  16  0 - 35 U/L    Alkaline Phosphatase  66  39 - 117 U/L    Total Bilirubin  0.3  0.3 - 1.2 mg/dL    GFR calc non Af Amer  69 (*)  >90 mL/min    GFR calc Af Amer  80 (*)  >90 mL/min    Comment:  (NOTE)     The eGFR has been calculated using the CKD EPI equation.     This calculation has not been validated in all clinical situations.     eGFR's persistently <90 mL/min signify possible Chronic Kidney     Disease.   CBC Status: None    Collection Time    06/03/13 3:49 PM   Result  Value  Range    WBC  7.5  4.0 - 10.5 K/uL    RBC  4.58  3.87 - 5.11 MIL/uL    Hemoglobin  13.6  12.0 - 15.0 g/dL    HCT  84.6  96.2 -  95.2 %    MCV  86.0  78.0 - 100.0 fL    MCH  29.7  26.0 - 34.0 pg    MCHC  34.5  30.0 - 36.0 g/dL    RDW  84.1  32.4 - 40.1 %    Platelets  205  150 - 400 K/uL   No results found.  Review of Systems  All other systems reviewed and are negative.  There were no vitals taken for this visit.  Physical Exam  Constitutional: She is oriented to person, place, and time. She appears well-developed and well-nourished. No distress.  HENT:  Head: Normocephalic and atraumatic.  Eyes: Conjunctivae are normal. Pupils are equal, round, and reactive to light.  Neck: Normal range of motion. Neck supple. No tracheal deviation present.  Cardiovascular: Normal rate, regular rhythm and normal heart sounds.  Respiratory: Effort normal and breath sounds normal. No respiratory distress.  GI: She exhibits no distension. There is no tenderness.  Musculoskeletal: Normal range of motion.  Neurological: She is alert and oriented to person, place, and time.  Skin: Skin is warm and dry. No rash noted. No erythema.  Psychiatric: Her behavior is normal. Judgment normal.  Breasts: Excision of the left breast is well-healed   Addendum:  Large left breast hematoma Assessment/Plan  Left breast DCIS  She now presents for reexcision of the lumpectomy site and sentinel biopsy. I discussed the  risks with her which includes but not limited to bleeding, infection, need for further surgery if the margins are positive, seroma formation, etc. She understands and wishes to proceed. Surgery is scheduled   Addendum:  She has developed a postop hematoma so will have to return to OR now for evacuation of the left breast hematoma.  Risks discussed.

## 2013-06-05 NOTE — Interval H&P Note (Signed)
History and Physical Interval Note:  Postop patient felt a sudden pop and noticed a large amount of swelling at the left breast operative site.  On exam, she is hemodynamically stable.  A large hematoma has developed at the left breast lumpectomy site.  06/05/2013 11:17 AM  [name]  has presented today for surgery, with the diagnosis of left breast DCIS   The various methods of treatment have been discussed with the patient and family. After consideration of risks, benefits and other options for treatment, the patient has consented to  Procedure(s): BREAST LUMPECTOMY WITH SENTINEL LYMPH NODE BX (Left) as a surgical intervention .  The patient's history has been reviewed, patient examined, no change in status, stable for surgery.  I have reviewed the patient's chart and labs.  Questions were answered to the patient's satisfaction.    Addendum:  Plan emergent evacuation of the left breast hematoma in the OR today.  I discussed the risks with her in detail.  She agrees to proceed.  Kirby Cortese A

## 2013-06-05 NOTE — Op Note (Signed)
BREAST LUMPECTOMY WITH SENTINEL LYMPH NODE BX  Procedure Note  [name] 06/05/2013   Pre-op Diagnosis: left breast DCIS      Post-op Diagnosis: same  Procedure(s): RE-EXCISION LEFT BREAST DCIS LUMPECTOMY, SENTINEL LYMPH NODE BIOPSY  Surgeon(s): Shelly Rubenstein, MD  Anesthesia: General  Staff:  Circulator: Lina Sayre, RN Scrub Person: Leighton Parody, CST  Estimated Blood Loss: Minimal               Specimens: SENT TO PATH          [name]   Date: 06/05/2013  Time: 8:41 AM

## 2013-06-05 NOTE — Anesthesia Preprocedure Evaluation (Signed)
Anesthesia Evaluation  Patient identified by MRN, date of birth, ID band Patient awake    Reviewed: Allergy & Precautions, H&P , NPO status , Patient's Chart, lab work & pertinent test results, reviewed documented beta blocker date and time   History of Anesthesia Complications (+) PONV  Airway Mallampati: II TM Distance: >3 FB Neck ROM: Full    Dental  (+) Missing and Dental Advisory Given   Pulmonary Current Smoker,  breath sounds clear to auscultation  Pulmonary exam normal       Cardiovascular + Peripheral Vascular Disease (h/o cerebral aneurysm, asymptomatic) Rhythm:Regular Rate:Normal     Neuro/Psych TIA   GI/Hepatic negative GI ROS, (+) Hepatitis -, C  Endo/Other  negative endocrine ROS  Renal/GU negative Renal ROS     Musculoskeletal  (+) Arthritis - (narcotics daily),   Abdominal   Peds  Hematology   Anesthesia Other Findings   Reproductive/Obstetrics                           Anesthesia Physical Anesthesia Plan  ASA: II and emergent  Anesthesia Plan: General   Post-op Pain Management:    Induction: Intravenous  Airway Management Planned: Oral ETT  Additional Equipment:   Intra-op Plan:   Post-operative Plan: Extubation in OR  Informed Consent: I have reviewed the patients History and Physical, chart, labs and discussed the procedure including the risks, benefits and alternatives for the proposed anesthesia with the patient or authorized representative who has indicated his/her understanding and acceptance.   Dental advisory given  Plan Discussed with: CRNA and Surgeon  Anesthesia Plan Comments: (Plan routine monitors, GETA)        Anesthesia Quick Evaluation

## 2013-06-05 NOTE — Interval H&P Note (Signed)
History and Physical Interval Note: no change in H and P  06/05/2013 7:06 AM  [name]  has presented today for surgery, with the diagnosis of left breast DCIS   The various methods of treatment have been discussed with the patient and family. After consideration of risks, benefits and other options for treatment, the patient has consented to  Procedure(s): BREAST LUMPECTOMY WITH SENTINEL LYMPH NODE BX (Left) as a surgical intervention .  The patient's history has been reviewed, patient examined, no change in status, stable for surgery.  I have reviewed the patient's chart and labs.  Questions were answered to the patient's satisfaction.     [name]

## 2013-06-05 NOTE — Anesthesia Preprocedure Evaluation (Signed)
Anesthesia Evaluation  Patient identified by MRN, date of birth, ID band Patient awake    Reviewed: Allergy & Precautions, H&P , NPO status , Patient's Chart, lab work & pertinent test results  Airway Mallampati: I TM Distance: >3 FB Neck ROM: full    Dental   Pulmonary Current Smoker,          Cardiovascular + Peripheral Vascular Disease Rhythm:regular Rate:Normal     Neuro/Psych Anxiety TIACVA    GI/Hepatic (+) Hepatitis -  Endo/Other    Renal/GU      Musculoskeletal   Abdominal   Peds  Hematology   Anesthesia Other Findings   Reproductive/Obstetrics                           Anesthesia Physical Anesthesia Plan  ASA: III  Anesthesia Plan: General   Post-op Pain Management:    Induction: Intravenous  Airway Management Planned: LMA and Oral ETT  Additional Equipment:   Intra-op Plan:   Post-operative Plan: Extubation in OR  Informed Consent: I have reviewed the patients History and Physical, chart, labs and discussed the procedure including the risks, benefits and alternatives for the proposed anesthesia with the patient or authorized representative who has indicated his/her understanding and acceptance.     Plan Discussed with: CRNA, Anesthesiologist and Surgeon  Anesthesia Plan Comments:         Anesthesia Quick Evaluation

## 2013-06-05 NOTE — Transfer of Care (Signed)
Immediate Anesthesia Transfer of Care Note  Patient: [name]  Procedure(s) Performed: Procedure(s): EVACUATION HEMATOMA BREAST (Left)  Patient Location: PACU  Anesthesia Type:General  Level of Consciousness: awake, alert , oriented and patient cooperative  Airway & Oxygen Therapy: Patient spontaneously breathing  Post-op Assessment: Report given to PACU RN, Post -op Vital signs reviewed and stable and Patient moving all extremities X 4  Post vital signs: Reviewed, stable  Complications: No apparent anesthesia complications

## 2013-06-05 NOTE — Progress Notes (Signed)
Dentures returned to pt per request 

## 2013-06-05 NOTE — Op Note (Signed)
EVACUATION HEMATOMA BREAST  Procedure Note  Phylicia B Cripps 06/05/2013   Pre-op Diagnosis: post op hematoma     Post-op Diagnosis: same  Procedure(s): EVACUATION HEMATOMA BREAST  Surgeon(s): Shelly Rubenstein, MD  Anesthesia: General  Staff:  Circulator: Jacqlyn Larsen, RN; Nelta Numbers, RN Scrub Person: Gunnar Fusi, RN; Staci Righter, CST  Estimated Blood Loss: Minimal                         Kajal Scalici A   Date: 06/05/2013  Time: 12:26 PM

## 2013-06-05 NOTE — OR Nursing (Signed)
Dr. Magnus Ivan here. Patient will need to return to surgery.

## 2013-06-05 NOTE — OR Nursing (Signed)
[name] was brought back to the PACU at approximately 1040 by her sister. She stated that they were leaving the drugstore and she felt her breast go "pop" and it immediately started swelling. [name] was brought into the Phase II area of PACU where her left breast was noted to be edematous, tight, with a small amount of blood on the bandage. Dr. Magnus Ivan was notified. He is returning to the hospital.

## 2013-06-06 NOTE — Op Note (Signed)
[name], [name]                ACCOUNT NO.:  [id number]  MEDICAL RECORD NO.:  [id number]  LOCATION:  [facility]                         FACILITY:  [facility]  PHYSICIAN:  Abigail Miyamoto, M.D. DATE OF BIRTH:  [birth year]  DATE OF PROCEDURE:  06/05/2013 DATE OF DISCHARGE:  06/05/2013                              OPERATIVE REPORT   PREOPERATIVE DIAGNOSIS:  Left breast ductal carcinoma in situ.  POSTOPERATIVE DIAGNOSIS:  Left breast ductal carcinoma in situ.  PROCEDURE: 1. Re-excision of left breast lumpectomy with left axillary sentinel     lymph node biopsy. 2. Injection of blue dye.  SURGEON:  Abigail Miyamoto, M.D.  ANESTHESIA:  General and 0.5% Marcaine.  ESTIMATED BLOOD LOSS:  Minimal.  INDICATIONS:  This is a 57 year old female who underwent a combined procedure of a small bowel resection for carcinoid tumor as well as re- excision of a fibroadenoma of the left breast.  The fibroadenoma had been biopsied and was found to be benign, but it was becoming larger, so removal was requested.  The biopsy specimen in the periphery ductal carcinoma in situ was identified.  Margins were closed, but negative. Followup MRI showed no other findings.  Decision was made to proceed with re-excision of the lumpectomy site.  PROCEDURE IN DETAIL:  The patient was brought to the operating room, identified as [name].  She was placed supine on the operating room table and general anesthesia was induced.  She had already been injected with radioactive isotope in the holding area.  I prepped the area of the left breast and injected blue dye underneath the areola and nipple and massaged the breast.  Her left breast and axilla were then prepped and draped in usual sterile fashion.  I anesthetized the skin around the previous incision at the lower edge of the areola with Marcaine.  I then excised the scar with a scalpel and took this down to the breast tissue. I then performed a wide excision of  the lumpectomy cavity.  I took this all the way down to the chest wall.  A wide excision appeared to be achieved.  The specimen was then marked circumferentially with marker paint and sent to Pathology for evaluation.  The Neoprobe was then brought out to the field.  I identified an area of increased uptake in the left axilla.  I anesthetized the skin with Marcaine.  I made a small incision with a scalpel.  I took this down to the axillary tissue with the electrocautery.  Then using the Neoprobe, I was able to identify a lymph node with increased uptake.  This lymph node also had uptake of blue dye.  I excised the node in its entirety and sent it to Pathology for evaluation.  I then again examined the axilla with the Neoprobe and found no other area of increased uptake in the nodal basin.  Hemostasis was achieved with the cautery.  I irrigated both wounds with saline, anesthetized further with Marcaine.  I then closed subcutaneous tissue with interrupted 3-0 Vicryl sutures and closed the skin of both incisions with running 4-0 Monocryl.  Steri-Strips, gauze, and Tegaderm were then applied.  The patient tolerated the procedure well.  All counts were correct at the end of procedure.  The patient was then extubated in the operating room and taken in a stable condition to recovery room.     Abigail Miyamoto, M.D.     DB/MEDQ  D:  06/05/2013  T:  06/06/2013  Job:  [id number]

## 2013-06-06 NOTE — Op Note (Signed)
[name], [name]                ACCOUNT NO.:  [id number]  MEDICAL RECORD NO.:  [id number]  LOCATION:  [facility]                         FACILITY:  [facility]  PHYSICIAN:  [name], M.D. DATE OF BIRTH:  03-15-1956  DATE OF PROCEDURE:  06/05/2013 DATE OF DISCHARGE:  06/05/2013                              OPERATIVE REPORT   PREOPERATIVE DIAGNOSIS:  Left breast postoperative hematoma.  POSTOPERATIVE DIAGNOSIS:  Left breast postoperative hematoma.  PROCEDURES:  Evacuation of left breast postoperative hematoma.  SURGEON:  [name], M.D.  ANESTHESIA:  General with 0.5% Marcaine with epinephrine.  ESTIMATED BLOOD LOSS:  Minimal.  INDICATIONS:  This is a 57 year old female who earlier today had undergone a left breast re-excision of ductal carcinoma in situ as well as a sentinel lymph node biopsy.  She recovered for several hours in the hospital, was doing well when she was in the pharmacy.  In line, she had a sudden pop in the breast and then developed a large hematoma.  She now presents for evacuation.  PROCEDURE IN DETAIL:  The patient was brought to the operating room, identified as [name].  She was placed spine on the operating table, and general anesthesia was induced.  Her left breast was then prepped and draped in the usual sterile fashion.  I used a scalpel to excise the previous subcuticular sutures.  I then evacuated a very large hematoma from the breast.  I then thoroughly irrigated the breast with several liters of normal saline.  There were several areas of mild venous disease, but no pumping blood vessel.  I cauterized all these areas and washed the breast for quite some time.  I anesthetized it further with Marcaine with epinephrine.  I again evaluated and saw no evidence of ongoing bleeding.  Hemostasis appeared to be achieved again. At this point, I closed the subcutaneous tissue with interrupted 3-0 Vicryl sutures and closed the skin with running  4-0 Monocryl.  Steri- Strips, gauze, and Tegaderm were then applied.  The patient tolerated the procedure well.  All the counts were correct at the end of procedure.  The patient was then extubated in the operating room and taken in stable condition to the recovery room.     [name], M.D.    DB/MEDQ  D:  06/05/2013  T:  06/06/2013  Job:  [id number]

## 2013-06-10 ENCOUNTER — Encounter (HOSPITAL_COMMUNITY): Payer: Self-pay | Admitting: Surgery

## 2013-06-16 ENCOUNTER — Other Ambulatory Visit (INDEPENDENT_AMBULATORY_CARE_PROVIDER_SITE_OTHER): Payer: Self-pay | Admitting: *Deleted

## 2013-06-16 ENCOUNTER — Telehealth (INDEPENDENT_AMBULATORY_CARE_PROVIDER_SITE_OTHER): Payer: Self-pay | Admitting: General Surgery

## 2013-06-16 ENCOUNTER — Telehealth (INDEPENDENT_AMBULATORY_CARE_PROVIDER_SITE_OTHER): Payer: Self-pay | Admitting: *Deleted

## 2013-06-16 ENCOUNTER — Encounter (INDEPENDENT_AMBULATORY_CARE_PROVIDER_SITE_OTHER): Payer: Self-pay | Admitting: Surgery

## 2013-06-16 NOTE — Telephone Encounter (Signed)
Called patient and told her that she has a Rx for Noroco 5/325 1-2 tabs 6 hrs as needed for pain 330 no refills

## 2013-06-16 NOTE — Telephone Encounter (Signed)
Patient called to state that when she went to get her refill of Norco which was prescribed to her on 06/05/13 the pharmacy will not accept it due to the law change.  Explained to patient that that is true and it would have to be a hard prescription.  Patient did state that she is still having to take 2 tablets every 6 hours for pain control, so patient is asking if there is anyway she can be prescribed the Norco 10/325mg  so she is not taking as much Tylenol.  Explained to patient that Dr. Magnus Ivan would be in the office later today so I would ask then let her know.  Patient states understanding and agreeable at this time.

## 2013-06-18 ENCOUNTER — Ambulatory Visit (INDEPENDENT_AMBULATORY_CARE_PROVIDER_SITE_OTHER): Payer: Medicaid Other | Admitting: Surgery

## 2013-06-18 ENCOUNTER — Encounter (INDEPENDENT_AMBULATORY_CARE_PROVIDER_SITE_OTHER): Payer: Self-pay | Admitting: Surgery

## 2013-06-18 ENCOUNTER — Encounter (INDEPENDENT_AMBULATORY_CARE_PROVIDER_SITE_OTHER): Payer: Self-pay

## 2013-06-18 VITALS — BP 130/78 | HR 72 | Temp 97.2°F | Resp 14 | Ht 66.0 in | Wt 143.0 lb

## 2013-06-18 DIAGNOSIS — Z09 Encounter for follow-up examination after completed treatment for conditions other than malignant neoplasm: Secondary | ICD-10-CM

## 2013-06-18 NOTE — Progress Notes (Signed)
Subjective:     Patient ID: [name], female   DOB: [birth year], 57 y.o.   MRN: [id number]  HPI She is here for a postop visit status post reexcision of the left breast lumpectomy site. She actually had to return to the operating room later that day for a hematoma. She is now doing well and is comfortable  Review of Systems     Objective:   Physical Exam On exam, there still swelling of the left breast with some seroma and ecchymosis.  The final pathology showed residual DCIS with negative margins. The sentinel node was negative as well    Assessment:     Patient stable postop     Plan:     She will be seeing the radiation oncologist next week. I will see her back in one month. I may need to drain some of the seroma

## 2013-06-23 ENCOUNTER — Encounter: Payer: Self-pay | Admitting: *Deleted

## 2013-06-23 NOTE — Progress Notes (Signed)
Location of Breast Cancer: Left Breast lower outer quadrant  Histology per Pathology Report: 06/05/13: Biopsy: Diagnosis1. Breast, excision, Left- FIBROADENOMA WITH RESIDUAL 0.15 CM AREA OF LOW GRADE DUCTAL CARCINOMA IN SITU.- ASSOCIATED FIBROSIS, CHRONIC INFLAMMATION AND FAT NECROSIS WITH FOREIGN BODYGIANT CELL REACTION.- MARGINS ARE NEGATIVE.- SEE COMMENT.ADDITIONAL FINDINGS:- BENIGN SKIN.- FIBROCYSTIC CHANGES ASSOCIATED MICROCALCIFICATION AND FOCAL SCLEROSINADENOSIS.2. Lymph node, sentinel, biopsy, Left axillary #1- ONE BENIGN LYMPH NODE WITH NO TUMOR SEEN (0/1)  Diagnosis 04/02/13: 1. Breast, excision, Left- FIBROADENOMA WITH EXTENSIVE INVOLVEMENT WITH LOW GRADE CARCINOMA IN SITU.- ESTIMATED TUMOR SIZE IS 3.5 CM IN GREATEST DIMENSION.- LOW GRADE DUCTAL CARCINOMA IN SITU LESS THAN 0.1 CM FROM INKED MARGIN.- SEE ONCOLOGY TEMPLATE.2. Small intestine, resection, Small- INVASIVE WELL DIFFERENTIATED NEUROENDOCRINE TUMOR (CARCINOID), SPANNING 2 CM IN GREATEST Also  7//23/14 Small bowel Obstruction Lap/Resection ,  Stage II Carcinoid,; Dr. Abigail Miyamoto, A.  +), PR (+), Her2-neu ( )  Did patient present with symptoms (if so, please note symptoms) or was this found on screening mammography?:pt noted left breast mass had U/S guided biopsy showed fibroadenoma, then recently had enlargement assoc with discomfort, had excision 04/02/13 and biopsy  Past/Anticipated interventions by surgeon, if any: 06/18/13:She is here for a postop visit status post reexcision of the left breast lumpectomy site. She actually had to return to the operating room later that day for a hematoma. She is now doing well and is comfortable  Objective:   On exam, there still swelling of the left breast with some seroma and ecchymosis. The final pathology showed residual DCIS with negative margins. The sentinel node was negative as well  Assessment:   Patient stable postop  Plan:   She will be seeing the radiation oncologist next week. I  will see her back in one month. I may need to drain some of the seroma , follow up post op check 07/21/13 Dr.Blackman     Past/Anticipated interventions by medical oncology, if any: Chemotherapy : referral to Radiation Oncology; , when completed radiation therapy, will start antiestrogen therapy with tamoxifen, follow up 08/28/13 Lymphedema issues, if any: no Pain issues, if any:  Soreness pain in left breast 4 on 1-10 scale, taking oxycodone 5mg  pprn q 6h SAFETY ISSUES:no  Prior radiation? no  Pacemaker/ICD? no  Possible current pregnancy?no Is the patient on methotrexate? 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

## 2013-06-23 NOTE — Progress Notes (Signed)
Mailed after appt letter to pt. 

## 2013-06-25 ENCOUNTER — Ambulatory Visit
Admission: RE | Admit: 2013-06-25 | Discharge: 2013-06-25 | Disposition: A | Discharge status: Home / Self Care | Payer: Medicaid Other | Source: Ambulatory Visit | Attending: Radiation Oncology | Admitting: Radiation Oncology

## 2013-06-25 ENCOUNTER — Encounter: Payer: Self-pay | Admitting: Radiation Oncology

## 2013-06-25 VITALS — BP 127/79 | HR 64 | Temp 97.6°F | Resp 20 | Ht 66.0 in | Wt 144.0 lb

## 2013-06-25 DIAGNOSIS — Z7982 Long term (current) use of aspirin: Secondary | ICD-10-CM | POA: Insufficient documentation

## 2013-06-25 DIAGNOSIS — C50512 Malignant neoplasm of lower-outer quadrant of left female breast: Secondary | ICD-10-CM

## 2013-06-25 DIAGNOSIS — D0590 Unspecified type of carcinoma in situ of unspecified breast: Secondary | ICD-10-CM | POA: Insufficient documentation

## 2013-06-25 NOTE — Progress Notes (Signed)
Please see the Nurse Progress Note in the MD Initial Consult Encounter for this patient. 

## 2013-06-25 NOTE — Progress Notes (Signed)
Radiation Oncology         [phone] ________________________________  Name: [name] MRN: [id number]  Date: 06/25/2013  DOB: 1956-02-06  Follow-Up Visit Note  CC: [name], MD  Shelly Rubenstein, MD  Diagnosis:   Ductal carcinoma in situ of the left breast  Interval Since Last Radiation: Not applicable   Narrative:  The patient returns today for routine follow-up.  The patient's case was discussed in multidisciplinary breast conference this morning. She underwent re\re excision. Some residual DCIS was present with negative margins she. One benign lymph node was also evaluated with a sentinel lymph node biopsy. The patient describes some immediate postoperative bleeding/development of a hematoma. She has been recovering from this. She is scheduled to see Dr. Magnus Ivan in several weeks to review how this is healing. She indicates that I discussed the possibility of further drainage of this area.                               ALLERGIES:  is allergic to statins; ace inhibitors; and ciprofloxacin hcl.  Meds: Current Outpatient Prescriptions  Medication Sig Dispense Refill  . ALPRAZolam (XANAX) 1 MG tablet Take 1 mg by mouth 3 (three) times daily as needed.      Marland Kitchen ibuprofen (ADVIL,MOTRIN) 200 MG tablet Take 200 mg by mouth every 6 (six) hours as needed.      . Ibuprofen-Diphenhydramine Cit (IBUPROFEN PM) 200-38 MG TABS Take 1 tablet by mouth at bedtime as needed. Takes when xanax doesnt help      . Multiple Vitamins-Minerals (THERA-M) TABS Take 1 tablet by mouth daily.      Marland Kitchen oxyCODONE (ROXICODONE) 5 MG immediate release tablet Take 5 mg by mouth every 6 (six) hours as needed.      Marland Kitchen aspirin EC 81 MG tablet Take 81 mg by mouth 2 (two) times daily.       No current facility-administered medications for this encounter.    Physical Findings: The patient is in no acute distress. Patient is alert and oriented.  height is 5\' 6"  (1.676 m) and weight is 144 lb (65.318 kg).  Her oral temperature is 97.6 F (36.4 C). Her blood pressure is 127/79 and her pulse is 64. Her respiration is 20. .   Some bruising present in the left breast area with a large palpable hematoma present in the inferior aspect of the left breast. The surgical incision is healing well.  Lab Findings: Lab Results  Component Value Date   WBC 7.5 06/03/2013   HGB 13.6 06/03/2013   HCT 39.4 06/03/2013   MCV 86.0 06/03/2013   PLT 205 06/03/2013     Radiographic Findings: Nm Sentinel Node Inj-no Rpt (breast)  06/05/2013   CLINICAL DATA: left breast cancer   Sulfur colloid was injected intradermally by the nuclear medicine  technologist for breast cancer sentinel node localization.     Impression:    The patient is appropriate to proceed with adjuvant radiotherapy. I discussed once again the expected benefit of a course of adjuvant radiotherapy. We also discussed the possible side effects and risks of treatment and all of the patient's questions were answered. We will schedule a simulation in the next several weeks such that we can proceed with treatment planning. I discussed with the patient that I need to coordinate this with Dr. Magnus Ivan. I do not want to start radiation treatment before he reevaluates the patient. If any additional  procedures are necessary then these will need to be done prior to beginning her course of treatment.  Plan:  Simulation in the near future to be coordinated with Dr. Eliberto Ivory office with his reevaluation.  I spent 25 minutes with the patient today, the majority of which was spent counseling the patient on the diagnosis of cancer and coordinating care.   Radene Gunning, M.D., Ph.D.

## 2013-07-03 ENCOUNTER — Encounter (INDEPENDENT_AMBULATORY_CARE_PROVIDER_SITE_OTHER): Payer: Self-pay | Admitting: Surgery

## 2013-07-03 ENCOUNTER — Ambulatory Visit (INDEPENDENT_AMBULATORY_CARE_PROVIDER_SITE_OTHER): Payer: Medicaid Other | Admitting: Surgery

## 2013-07-03 VITALS — BP 142/82 | HR 64 | Temp 97.6°F | Resp 16 | Ht 66.0 in | Wt 142.6 lb

## 2013-07-03 DIAGNOSIS — Z09 Encounter for follow-up examination after completed treatment for conditions other than malignant neoplasm: Secondary | ICD-10-CM

## 2013-07-03 NOTE — Progress Notes (Signed)
Subjective:     Patient ID: [name], female   DOB: 1956/04/22, 57 y.o.   MRN: [id number]  HPI She is here for another postoperative visit. She reports her breast now feels like it is getting back to normal. She does have some discomfort at her abdominal incision  Review of Systems     Objective:   Physical Exam On exam, her left breast is healing well. There is not seem to be a retained seroma or hematoma.  Her midline incision is well healed and I can palpate the underlying permanent suture    Assessment:     Patient stable postop     Plan:     I believe she can go ahead and start radiation. I will see her back in 3 months unless there is a problem

## 2013-07-09 ENCOUNTER — Ambulatory Visit
Admission: RE | Admit: 2013-07-09 | Discharge: 2013-07-09 | Disposition: A | Discharge status: Home / Self Care | Payer: Medicaid Other | Source: Ambulatory Visit | Attending: Radiation Oncology | Admitting: Radiation Oncology

## 2013-07-09 DIAGNOSIS — L259 Unspecified contact dermatitis, unspecified cause: Secondary | ICD-10-CM | POA: Insufficient documentation

## 2013-07-09 DIAGNOSIS — C50512 Malignant neoplasm of lower-outer quadrant of left female breast: Secondary | ICD-10-CM

## 2013-07-09 DIAGNOSIS — Z510 Encounter for antineoplastic radiation therapy: Secondary | ICD-10-CM | POA: Insufficient documentation

## 2013-07-09 DIAGNOSIS — N644 Mastodynia: Secondary | ICD-10-CM | POA: Insufficient documentation

## 2013-07-09 DIAGNOSIS — C50919 Malignant neoplasm of unspecified site of unspecified female breast: Secondary | ICD-10-CM | POA: Insufficient documentation

## 2013-07-11 NOTE — Progress Notes (Signed)
  Radiation Oncology         [phone] ________________________________  Name: [name] MRN: [id number]  Date: 07/09/2013  DOB: [birth year]   SIMULATION AND TREATMENT PLANNING NOTE  The patient presented for simulation prior to beginning her course of radiation treatment for her diagnosis of left-sided breast cancer. The patient was placed in a supine position on a breast board. A customized accuform device was also constructed, and this complex treatment device will be used on a daily basis during her treatment. In this fashion, a CT scan was obtained through the chest area and an isocenter was placed near the chest wall within the left breast. Both a free breathing scan and a breath-hold scan were both obtained to determine which technique was optimal, and to see if the breath-hold technique markedly improved the anticipated dose to the heart. This was the case. Therefore we will use this technique.  The patient will be planned to receive a course of radiation initially to a dose of 50.4 gray. This will consist of a whole breast radiotherapy technique. To accomplish this, 2 customized blocks have been designed which will correspond to medial and lateral whole breast tangent fields. This treatment will be accomplished at 1.8 gray per fraction. A complex isodose plan is requested to ensure that the breast target area is adequately covered dosimetrically. A forward planning technique will also be evaluated to determine if this approach improves the plan. It is anticipated that the patient will then receive a 10 gray boost to the seroma cavity which has been contoured. This will be accomplished at 2 gray per fraction. The final anticipated total dose therefore will correspond to 60.4 gray.  This initial treatment will consist of a 3-D conformal technique. The seroma has been contoured as the primary target structure. Additionally, dose volume histograms of both this target as well as the lungs and  heart will also be evaluated. Such an approach is necessary to ensure that the target area is adequately covered while the nearby critical normal structures are adequately spared.   _______________________________   Radene Gunning, MD, PhD

## 2013-07-11 NOTE — Addendum Note (Signed)
Encounter addended by: Jonna Coup, MD on: 07/11/2013  5:54 PM<BR>     Documentation filed: Notes Section

## 2013-07-11 NOTE — Progress Notes (Signed)
  Radiation Oncology         [phone] ________________________________  Name: [name] MRN: [id number]  Date: 07/09/2013  DOB: Oct 21, 1955  RESPIRATORY MOTION MANAGEMENT SIMULATION  NARRATIVE:  In order to account for effect of respiratory motion on target structures and other organs in the planning and delivery of radiotherapy, this patient underwent respiratory motion management simulation.  To accomplish this, when the patient was brought to the CT simulation planning suite, 4D respiratoy motion management CT images were obtained.  The CT images were loaded into the planning software.  Then, using a variety of tools including Cine, MIP, and standard views, the target volume and planning target volumes (PTV) were delineated.  Avoidance structures were contoured.  Treatment planning then occurred.  Dose volume histograms were generated and reviewed for each of the requested structure.  The resulting plan was carefully reviewed and approved today.   ------------------------------------------------  Radene Gunning, MD, PhD

## 2013-07-16 ENCOUNTER — Ambulatory Visit
Admission: RE | Admit: 2013-07-16 | Discharge: 2013-07-16 | Disposition: A | Discharge status: Home / Self Care | Payer: Medicaid Other | Source: Ambulatory Visit | Attending: Radiation Oncology | Admitting: Radiation Oncology

## 2013-07-16 DIAGNOSIS — C50512 Malignant neoplasm of lower-outer quadrant of left female breast: Secondary | ICD-10-CM

## 2013-07-16 NOTE — Progress Notes (Signed)
  Radiation Oncology         [phone] ________________________________  Name: [name] MRN: [id number]  Date: 07/16/2013  DOB: July 18, 1956  Simulation Verification Note   NARRATIVE: The patient was brought to the treatment unit and placed in the planned treatment position. The clinical setup was verified. Then port films were obtained and uploaded to the radiation oncology medical record software.  The treatment beams were carefully compared against the planned radiation fields. The position, location, and shape of the radiation fields was reviewed. The targeted volume of tissue appears to be appropriately covered by the radiation beams. Based on my personal review, I approved the simulation verification. The patient's treatment will proceed as planned.  ________________________________   Radene Gunning, MD, PhD

## 2013-07-17 ENCOUNTER — Encounter: Payer: Self-pay | Admitting: *Deleted

## 2013-07-17 ENCOUNTER — Ambulatory Visit
Admission: RE | Admit: 2013-07-17 | Discharge: 2013-07-17 | Disposition: A | Discharge status: Home / Self Care | Payer: Medicaid Other | Source: Ambulatory Visit | Attending: Radiation Oncology | Admitting: Radiation Oncology

## 2013-07-17 DIAGNOSIS — C50512 Malignant neoplasm of lower-outer quadrant of left female breast: Secondary | ICD-10-CM

## 2013-07-17 MED ORDER — RADIAPLEXRX EX GEL
Freq: Once | CUTANEOUS | Status: AC
  Administered 2013-07-17: 12:00:00 via TOPICAL

## 2013-07-17 MED ORDER — ALRA NON-METALLIC DEODORANT (RAD-ONC)
1.0000 "application " | Freq: Once | TOPICAL | Status: AC
  Administered 2013-07-17: 1 via TOPICAL

## 2013-07-17 NOTE — Progress Notes (Unsigned)
Patient education done, radiaplex gel and alra with radiation book given to patient, discusses pain,skin irritation,fatigue, diet,increase protein drink plenty fluids , teach back given 11:35 AM

## 2013-07-18 ENCOUNTER — Ambulatory Visit
Admission: RE | Admit: 2013-07-18 | Discharge: 2013-07-18 | Disposition: A | Discharge status: Home / Self Care | Payer: Medicaid Other | Source: Ambulatory Visit | Attending: Radiation Oncology | Admitting: Radiation Oncology

## 2013-07-21 ENCOUNTER — Encounter (INDEPENDENT_AMBULATORY_CARE_PROVIDER_SITE_OTHER): Payer: Medicaid Other | Admitting: Surgery

## 2013-07-21 ENCOUNTER — Ambulatory Visit
Admission: RE | Admit: 2013-07-21 | Discharge: 2013-07-21 | Disposition: A | Discharge status: Home / Self Care | Payer: Medicaid Other | Source: Ambulatory Visit | Attending: Radiation Oncology | Admitting: Radiation Oncology

## 2013-07-22 ENCOUNTER — Ambulatory Visit
Admission: RE | Admit: 2013-07-22 | Discharge: 2013-07-22 | Disposition: A | Discharge status: Home / Self Care | Payer: Medicaid Other | Source: Ambulatory Visit | Attending: Radiation Oncology | Admitting: Radiation Oncology

## 2013-07-23 ENCOUNTER — Ambulatory Visit
Admission: RE | Admit: 2013-07-23 | Discharge: 2013-07-23 | Disposition: A | Discharge status: Home / Self Care | Payer: Medicaid Other | Source: Ambulatory Visit | Attending: Radiation Oncology | Admitting: Radiation Oncology

## 2013-07-23 VITALS — BP 126/88 | HR 62 | Temp 97.8°F | Ht 66.0 in | Wt 145.8 lb

## 2013-07-23 DIAGNOSIS — C50512 Malignant neoplasm of lower-outer quadrant of left female breast: Secondary | ICD-10-CM

## 2013-07-23 NOTE — Progress Notes (Signed)
[name] here with her sister for weekly under treat visit.  She has had 5 fractions to her left breast.  She denies pain now but has pain during treatment and at night when she raises her left arm.  She says the pain is an aching in her side and back.  She does have fatigue.  The skin on her left breast is pink and intact.  She is using radiaplex twice a day.

## 2013-07-23 NOTE — Progress Notes (Signed)
   Department of Radiation Oncology  Phone:  [phone] Fax:        [phone]  Weekly Treatment Note    Name: [name] Date: 07/23/2013 MRN: [id number] DOB: Jun 19, 1956   Current dose: 9 Gy  Current fraction: 5   MEDICATIONS: Current Outpatient Prescriptions  Medication Sig Dispense Refill  . ALPRAZolam (XANAX) 1 MG tablet Take 1 mg by mouth 3 (three) times daily as needed.      Marland Kitchen aspirin EC 81 MG tablet Take 81 mg by mouth 2 (two) times daily.      . hyaluronate sodium (RADIAPLEXRX) GEL Apply 1 application topically once.      Marland Kitchen ibuprofen (ADVIL,MOTRIN) 200 MG tablet Take 200 mg by mouth every 6 (six) hours as needed.      . Ibuprofen-Diphenhydramine Cit (IBUPROFEN PM) 200-38 MG TABS Take 1 tablet by mouth at bedtime as needed. Takes when xanax doesnt help      . Multiple Vitamins-Minerals (THERA-M) TABS Take 1 tablet by mouth daily.      . non-metallic deodorant Thornton Papas) MISC Apply 1 application topically daily as needed.       No current facility-administered medications for this encounter.     ALLERGIES: Statins; Ace inhibitors; and Ciprofloxacin hcl   LABORATORY DATA:  Lab Results  Component Value Date   WBC 7.5 06/03/2013   HGB 13.6 06/03/2013   HCT 39.4 06/03/2013   MCV 86.0 06/03/2013   PLT 205 06/03/2013   Lab Results  Component Value Date   NA 141 06/03/2013   K 3.7 06/03/2013   CL 104 06/03/2013   CO2 27 06/03/2013   Lab Results  Component Value Date   ALT 16 06/03/2013   AST 15 06/03/2013   ALKPHOS 66 06/03/2013   BILITOT 0.3 06/03/2013     NARRATIVE: [name] was seen today for weekly treatment management. The chart was checked and the patient's films were reviewed. The patient is doing well after her first week of treatment. No significant complaints other than some continued postoperative pain in the breast area.  PHYSICAL EXAMINATION: height is 5\' 6"  (1.676 m) and weight is 145 lb 12.8 oz (66.134 kg). Her temperature is 97.8 F (36.6  C). Her blood pressure is 126/88 and her pulse is 62.        ASSESSMENT: The patient is doing satisfactorily with treatment.  PLAN: We will continue with the patient's radiation treatment as planned.

## 2013-07-24 ENCOUNTER — Ambulatory Visit
Admission: RE | Admit: 2013-07-24 | Discharge: 2013-07-24 | Disposition: A | Discharge status: Home / Self Care | Payer: Medicaid Other | Source: Ambulatory Visit | Attending: Radiation Oncology | Admitting: Radiation Oncology

## 2013-07-25 ENCOUNTER — Ambulatory Visit: Admission: RE | Admit: 2013-07-25 | Payer: Medicaid Other | Source: Ambulatory Visit | Admitting: Radiation Oncology

## 2013-07-25 ENCOUNTER — Ambulatory Visit
Admission: RE | Admit: 2013-07-25 | Discharge: 2013-07-25 | Disposition: A | Discharge status: Home / Self Care | Payer: Medicaid Other | Source: Ambulatory Visit | Attending: Radiation Oncology | Admitting: Radiation Oncology

## 2013-07-28 ENCOUNTER — Ambulatory Visit
Admission: RE | Admit: 2013-07-28 | Discharge: 2013-07-28 | Disposition: A | Discharge status: Home / Self Care | Payer: Medicaid Other | Source: Ambulatory Visit | Attending: Radiation Oncology | Admitting: Radiation Oncology

## 2013-07-29 ENCOUNTER — Ambulatory Visit
Admission: RE | Admit: 2013-07-29 | Discharge: 2013-07-29 | Disposition: A | Discharge status: Home / Self Care | Payer: Medicaid Other | Source: Ambulatory Visit | Attending: Radiation Oncology | Admitting: Radiation Oncology

## 2013-07-30 ENCOUNTER — Encounter: Payer: Self-pay | Admitting: Radiation Oncology

## 2013-07-30 ENCOUNTER — Ambulatory Visit
Admission: RE | Admit: 2013-07-30 | Discharge: 2013-07-30 | Disposition: A | Discharge status: Home / Self Care | Payer: Medicaid Other | Source: Ambulatory Visit | Attending: Radiation Oncology | Admitting: Radiation Oncology

## 2013-07-30 VITALS — BP 140/90 | HR 65 | Temp 97.5°F | Resp 20 | Wt 147.5 lb

## 2013-07-30 DIAGNOSIS — C50512 Malignant neoplasm of lower-outer quadrant of left female breast: Secondary | ICD-10-CM

## 2013-07-30 NOTE — Progress Notes (Signed)
wekly rad txs 10 completed left breast,erythema only,skin intact, radiaplex bid, occasional sharp pain in breast, then goes to dull ache for a while, takes tylenol pm q hs to help her sleep, off pain medications,, knees bother her,still smokiing, "But I have cut back" 10:55 AM

## 2013-07-30 NOTE — Progress Notes (Signed)
   Department of Radiation Oncology  Phone:  [phone] Fax:        [phone]  Weekly Treatment Note    Name: [name] Date: 07/30/2013 MRN: [id number] DOB: [birth year]   Current dose: 18 Gy  Current fraction: 10   MEDICATIONS: Current Outpatient Prescriptions  Medication Sig Dispense Refill  . ALPRAZolam (XANAX) 1 MG tablet Take 1 mg by mouth 3 (three) times daily as needed.      Marland Kitchen aspirin EC 81 MG tablet Take 81 mg by mouth 2 (two) times daily.      . diphenhydramine-acetaminophen (TYLENOL PM) 25-500 MG TABS Take 1 tablet by mouth at bedtime as needed.      . hyaluronate sodium (RADIAPLEXRX) GEL Apply 1 application topically once.      Marland Kitchen ibuprofen (ADVIL,MOTRIN) 200 MG tablet Take 200 mg by mouth every 6 (six) hours as needed.      . Ibuprofen-Diphenhydramine Cit (IBUPROFEN PM) 200-38 MG TABS Take 1 tablet by mouth at bedtime as needed. Takes when xanax doesnt help      . Multiple Vitamins-Minerals (THERA-M) TABS Take 1 tablet by mouth daily.      . non-metallic deodorant Thornton Papas) MISC Apply 1 application topically daily as needed.       No current facility-administered medications for this encounter.     ALLERGIES: Statins; Ace inhibitors; and Ciprofloxacin hcl   LABORATORY DATA:  Lab Results  Component Value Date   WBC 7.5 06/03/2013   HGB 13.6 06/03/2013   HCT 39.4 06/03/2013   MCV 86.0 06/03/2013   PLT 205 06/03/2013   Lab Results  Component Value Date   NA 141 06/03/2013   K 3.7 06/03/2013   CL 104 06/03/2013   CO2 27 06/03/2013   Lab Results  Component Value Date   ALT 16 06/03/2013   AST 15 06/03/2013   ALKPHOS 66 06/03/2013   BILITOT 0.3 06/03/2013     NARRATIVE: [name] was seen today for weekly treatment management. The chart was checked and the patient's films were reviewed. The patient is doing well after 2 weeks of treatment. No real skin irritation issues. No other complaints.  PHYSICAL EXAMINATION: weight is 147 lb 8 oz (66.906 kg).  Her oral temperature is 97.5 F (36.4 C). Her blood pressure is 140/90 and her pulse is 65. Her respiration is 20.      mild hyperpigmentation present  ASSESSMENT: The patient is doing satisfactorily with treatment.  PLAN: We will continue with the patient's radiation treatment as planned.

## 2013-07-31 ENCOUNTER — Ambulatory Visit
Admission: RE | Admit: 2013-07-31 | Discharge: 2013-07-31 | Disposition: A | Discharge status: Home / Self Care | Payer: Medicaid Other | Source: Ambulatory Visit | Attending: Radiation Oncology | Admitting: Radiation Oncology

## 2013-08-01 ENCOUNTER — Ambulatory Visit
Admission: RE | Admit: 2013-08-01 | Discharge: 2013-08-01 | Disposition: A | Discharge status: Home / Self Care | Payer: Medicaid Other | Source: Ambulatory Visit | Attending: Radiation Oncology | Admitting: Radiation Oncology

## 2013-08-04 ENCOUNTER — Encounter: Payer: Self-pay | Admitting: Radiation Oncology

## 2013-08-04 ENCOUNTER — Ambulatory Visit
Admission: RE | Admit: 2013-08-04 | Discharge: 2013-08-04 | Disposition: A | Discharge status: Home / Self Care | Payer: Medicaid Other | Source: Ambulatory Visit | Attending: Radiation Oncology | Admitting: Radiation Oncology

## 2013-08-04 VITALS — BP 138/100 | HR 69 | Temp 97.9°F | Resp 20 | Wt 146.1 lb

## 2013-08-04 DIAGNOSIS — C50512 Malignant neoplasm of lower-outer quadrant of left female breast: Secondary | ICD-10-CM

## 2013-08-04 NOTE — Progress Notes (Signed)
Weekly rad txs lt breast 13/33 mild erythema ,pain under axilla mild, knees hurt more, stress level up this am, didn't take xanax stated 10:27 AM

## 2013-08-04 NOTE — Progress Notes (Signed)
   Department of Radiation Oncology  Phone:  [phone] Fax:        [phone]  Weekly Treatment Note    Name: [name] Date: 08/04/2013 MRN: [id number] DOB: 01/15/1956   Current dose: 23.4 Gy  Current fraction: 13   MEDICATIONS: Current Outpatient Prescriptions  Medication Sig Dispense Refill  . ALPRAZolam (XANAX) 1 MG tablet Take 1 mg by mouth 3 (three) times daily.      Marland Kitchen aspirin EC 81 MG tablet Take 81 mg by mouth 2 (two) times daily.      . diphenhydramine-acetaminophen (TYLENOL PM) 25-500 MG TABS Take 1 tablet by mouth at bedtime as needed.      . hyaluronate sodium (RADIAPLEXRX) GEL Apply 1 application topically once.      Marland Kitchen ibuprofen (ADVIL,MOTRIN) 200 MG tablet Take 200 mg by mouth every 6 (six) hours as needed.      . Ibuprofen-Diphenhydramine Cit (IBUPROFEN PM) 200-38 MG TABS Take 1 tablet by mouth at bedtime as needed. Takes when xanax doesnt help      . Multiple Vitamins-Minerals (THERA-M) TABS Take 1 tablet by mouth daily.      . non-metallic deodorant Thornton Papas) MISC Apply 1 application topically daily as needed.       No current facility-administered medications for this encounter.     ALLERGIES: Statins; Ace inhibitors; and Ciprofloxacin hcl   LABORATORY DATA:  Lab Results  Component Value Date   WBC 7.5 06/03/2013   HGB 13.6 06/03/2013   HCT 39.4 06/03/2013   MCV 86.0 06/03/2013   PLT 205 06/03/2013   Lab Results  Component Value Date   NA 141 06/03/2013   K 3.7 06/03/2013   CL 104 06/03/2013   CO2 27 06/03/2013   Lab Results  Component Value Date   ALT 16 06/03/2013   AST 15 06/03/2013   ALKPHOS 66 06/03/2013   BILITOT 0.3 06/03/2013     NARRATIVE: [name] was seen today for weekly treatment management. The chart was checked and the patient's films were reviewed. The patient is doing very well. She states that her skin has not really changed much in terms of being irritated.  PHYSICAL EXAMINATION: weight is 146 lb 1.6 oz (66.271 kg).  Her oral temperature is 97.9 F (36.6 C). Her blood pressure is 138/100 and her pulse is 69. Her respiration is 20.      some redness secondary to treatment, doing well  ASSESSMENT: The patient is doing satisfactorily with treatment.  PLAN: We will continue with the patient's radiation treatment as planned.

## 2013-08-05 ENCOUNTER — Ambulatory Visit
Admission: RE | Admit: 2013-08-05 | Discharge: 2013-08-05 | Disposition: A | Discharge status: Home / Self Care | Payer: Medicaid Other | Source: Ambulatory Visit | Attending: Radiation Oncology | Admitting: Radiation Oncology

## 2013-08-06 ENCOUNTER — Ambulatory Visit
Admission: RE | Admit: 2013-08-06 | Discharge: 2013-08-06 | Disposition: A | Discharge status: Home / Self Care | Payer: Medicaid Other | Source: Ambulatory Visit | Attending: Radiation Oncology | Admitting: Radiation Oncology

## 2013-08-11 ENCOUNTER — Ambulatory Visit
Admission: RE | Admit: 2013-08-11 | Discharge: 2013-08-11 | Disposition: A | Discharge status: Home / Self Care | Payer: Medicaid Other | Source: Ambulatory Visit | Attending: Radiation Oncology | Admitting: Radiation Oncology

## 2013-08-12 ENCOUNTER — Ambulatory Visit: Payer: Medicaid Other

## 2013-08-13 ENCOUNTER — Telehealth (INDEPENDENT_AMBULATORY_CARE_PROVIDER_SITE_OTHER): Payer: Self-pay | Admitting: General Surgery

## 2013-08-13 ENCOUNTER — Telehealth (INDEPENDENT_AMBULATORY_CARE_PROVIDER_SITE_OTHER): Payer: Self-pay

## 2013-08-13 ENCOUNTER — Ambulatory Visit
Admission: RE | Admit: 2013-08-13 | Discharge: 2013-08-13 | Disposition: A | Discharge status: Home / Self Care | Payer: Medicaid Other | Source: Ambulatory Visit | Attending: Radiation Oncology | Admitting: Radiation Oncology

## 2013-08-13 ENCOUNTER — Encounter (INDEPENDENT_AMBULATORY_CARE_PROVIDER_SITE_OTHER): Payer: Self-pay | Admitting: Surgery

## 2013-08-13 NOTE — Telephone Encounter (Signed)
Call her in ultram 50 mg.  1 to 2 po q 4 hours prn pain.  #40 and make appt for next week.

## 2013-08-13 NOTE — Telephone Encounter (Signed)
Called Rx for Ultram into the [facility] on [facility] [phone]. Ultram 50 mg 1-2 po q 4 hrs prn for pain #40 with out

## 2013-08-13 NOTE — Telephone Encounter (Signed)
Patient calling into office to report having left breast pain that radiates around to her back.  Patient states "it feel's like something trying to claw it's way out of her back" patient spoke to Dr. Joellen Jersey nurse and they advised her to call our office.  Patient has been taking aleve and ibuprofen several times a day with no relief.  Patient had night sweats last night.  Patient hasn't taken her temperature.  Patient would like to what to do from this point.  Please advise.

## 2013-08-13 NOTE — Progress Notes (Signed)
Spoke w/Dr Mitzi Hansen re: pt's request for pain medication. Per Dr Mitzi Hansen pt can take Ibuprofen up to 800 mg 3-4 x daily. He states that if this does not relieve her pain she needs to contact her surgeon.  Spoke w/pt after her radiation treatment and advised her of Dr Joellen Jersey instructions. Pt states "I took 2 Aleve at 7 am today then 2 Ibuprofen, and it's still not working." Advised pt call Dr Magnus Ivan, her surgeon today and discuss w/RN. Pt stated she would call Dr Eliberto Ivory office today.

## 2013-08-13 NOTE — Telephone Encounter (Signed)
LMOM to let patient know that I called in her some ultram 50 mg 1-2 po q 4 hrs prn pain #40 with no refill. I also told the patient that she will need to call back to the office to make a follow up apt with Dr Magnus Ivan. Patient has medicaid [employer]

## 2013-08-13 NOTE — Telephone Encounter (Signed)
Called patient to let her know that I called her Rx into the [facility] on [facility]

## 2013-08-14 ENCOUNTER — Ambulatory Visit
Admission: RE | Admit: 2013-08-14 | Discharge: 2013-08-14 | Disposition: A | Discharge status: Home / Self Care | Payer: Medicaid Other | Source: Ambulatory Visit | Attending: Radiation Oncology | Admitting: Radiation Oncology

## 2013-08-14 VITALS — BP 118/80 | HR 68 | Temp 98.1°F | Ht 66.0 in | Wt 146.5 lb

## 2013-08-14 DIAGNOSIS — C50512 Malignant neoplasm of lower-outer quadrant of left female breast: Secondary | ICD-10-CM

## 2013-08-14 NOTE — Progress Notes (Signed)
   Department of Radiation Oncology  Phone:  [phone] Fax:        [phone]  Weekly Treatment Note    Name: [name] Date: 08/14/2013 MRN: [id number] DOB: 1956-08-20   Current dose: 32.4 Gy  Current fraction: 18   MEDICATIONS: Current Outpatient Prescriptions  Medication Sig Dispense Refill  . ALPRAZolam (XANAX) 1 MG tablet Take 1 mg by mouth 3 (three) times daily.      Marland Kitchen aspirin EC 81 MG tablet Take 81 mg by mouth 2 (two) times daily.      . hyaluronate sodium (RADIAPLEXRX) GEL Apply 1 application topically once.      Marland Kitchen ibuprofen (ADVIL,MOTRIN) 200 MG tablet Take 200 mg by mouth every 6 (six) hours as needed.      . Multiple Vitamins-Minerals (THERA-M) TABS Take 1 tablet by mouth daily.      . non-metallic deodorant Thornton Papas) MISC Apply 1 application topically daily as needed.      . traMADol (ULTRAM) 50 MG tablet Take by mouth every 6 (six) hours as needed.      . diphenhydramine-acetaminophen (TYLENOL PM) 25-500 MG TABS Take 1 tablet by mouth at bedtime as needed.      . Ibuprofen-Diphenhydramine Cit (IBUPROFEN PM) 200-38 MG TABS Take 1 tablet by mouth at bedtime as needed. Takes when xanax doesnt help       No current facility-administered medications for this encounter.     ALLERGIES: Statins; Ace inhibitors; and Ciprofloxacin hcl   LABORATORY DATA:  Lab Results  Component Value Date   WBC 7.5 06/03/2013   HGB 13.6 06/03/2013   HCT 39.4 06/03/2013   MCV 86.0 06/03/2013   PLT 205 06/03/2013   Lab Results  Component Value Date   NA 141 06/03/2013   K 3.7 06/03/2013   CL 104 06/03/2013   CO2 27 06/03/2013   Lab Results  Component Value Date   ALT 16 06/03/2013   AST 15 06/03/2013   ALKPHOS 66 06/03/2013   BILITOT 0.3 06/03/2013     NARRATIVE: [name] was seen today for weekly treatment management. The chart was checked and the patient's films were reviewed. The patient is doing well overall. She has noticed some increased radiation change in the  breast area. She is complaining of some pain in the left breast area. Dr. Magnus Ivan has called in tramadol for this.  PHYSICAL EXAMINATION: height is 5\' 6"  (1.676 m) and weight is 146 lb 8 oz (66.452 kg). Her temperature is 98.1 F (36.7 C). Her blood pressure is 118/80 and her pulse is 68.      some diffuse erythema/dermatitis present.   ASSESSMENT: The patient is doing satisfactorily with treatment.  PLAN: We will continue with the patient's radiation treatment as planned.

## 2013-08-14 NOTE — Progress Notes (Signed)
Arthea Folker here for weekly under treat visit. She has had 18 fractions to her left breast.  She is having pain in her left underarm and left breast.  She describes the pain in her breast as deep and "like a cat scratching its way out."  She contacted Her surgeon, Dr. Rayburn Ma who prescribed her tramadol.  She has not started taking it yet.  She has fatigue.  The skin on her right breast is red with a raised, scattered rash.  She is using radiaplex twice a day.

## 2013-08-15 ENCOUNTER — Ambulatory Visit
Admission: RE | Admit: 2013-08-15 | Discharge: 2013-08-15 | Disposition: A | Discharge status: Home / Self Care | Payer: Medicaid Other | Source: Ambulatory Visit | Attending: Radiation Oncology | Admitting: Radiation Oncology

## 2013-08-18 ENCOUNTER — Ambulatory Visit
Admission: RE | Admit: 2013-08-18 | Discharge: 2013-08-18 | Disposition: A | Discharge status: Home / Self Care | Payer: Medicaid Other | Source: Ambulatory Visit | Attending: Radiation Oncology | Admitting: Radiation Oncology

## 2013-08-19 ENCOUNTER — Ambulatory Visit
Admission: RE | Admit: 2013-08-19 | Discharge: 2013-08-19 | Disposition: A | Discharge status: Home / Self Care | Payer: Medicaid Other | Source: Ambulatory Visit | Attending: Radiation Oncology | Admitting: Radiation Oncology

## 2013-08-20 ENCOUNTER — Ambulatory Visit
Admission: RE | Admit: 2013-08-20 | Discharge: 2013-08-20 | Disposition: A | Discharge status: Home / Self Care | Payer: Medicaid Other | Source: Ambulatory Visit | Attending: Radiation Oncology | Admitting: Radiation Oncology

## 2013-08-21 ENCOUNTER — Encounter: Payer: Self-pay | Admitting: Radiation Oncology

## 2013-08-21 ENCOUNTER — Ambulatory Visit
Admission: RE | Admit: 2013-08-21 | Discharge: 2013-08-21 | Disposition: A | Discharge status: Home / Self Care | Payer: Medicaid Other | Source: Ambulatory Visit | Attending: Radiation Oncology | Admitting: Radiation Oncology

## 2013-08-22 ENCOUNTER — Encounter: Payer: Self-pay | Admitting: Radiation Oncology

## 2013-08-22 ENCOUNTER — Ambulatory Visit
Admission: RE | Admit: 2013-08-22 | Discharge: 2013-08-22 | Disposition: A | Discharge status: Home / Self Care | Payer: Medicaid Other | Source: Ambulatory Visit | Attending: Radiation Oncology | Admitting: Radiation Oncology

## 2013-08-22 VITALS — BP 141/88 | HR 69 | Resp 16 | Wt 147.5 lb

## 2013-08-22 DIAGNOSIS — C50512 Malignant neoplasm of lower-outer quadrant of left female breast: Secondary | ICD-10-CM

## 2013-08-22 NOTE — Progress Notes (Signed)
Reports hyperpigmentation of the left breast without any breaks in the skin. Reports that she follow up with Dr. Welton Flakes next week. Reports tramadol is helping to relieve shooting pain in her left/treated breast. Denies breast pain. Full ROM of left arm. No edema noted in her left arm. Reports using radiaplex as directed.

## 2013-08-22 NOTE — Progress Notes (Signed)
   Department of Radiation Oncology  Phone:  [phone] Fax:        [phone]  Weekly Treatment Note    Name: [name] Date: 08/22/2013 MRN: [id number] DOB: Feb 20, 1956   Current dose: 43.2 Gy  Current fraction: 24   MEDICATIONS: Current Outpatient Prescriptions  Medication Sig Dispense Refill  . ALPRAZolam (XANAX) 1 MG tablet Take 1 mg by mouth 3 (three) times daily.      Marland Kitchen aspirin EC 81 MG tablet Take 81 mg by mouth 2 (two) times daily.      . diphenhydramine-acetaminophen (TYLENOL PM) 25-500 MG TABS Take 1 tablet by mouth at bedtime as needed.      . hyaluronate sodium (RADIAPLEXRX) GEL Apply 1 application topically once.      Marland Kitchen ibuprofen (ADVIL,MOTRIN) 200 MG tablet Take 200 mg by mouth every 6 (six) hours as needed.      . Ibuprofen-Diphenhydramine Cit (IBUPROFEN PM) 200-38 MG TABS Take 1 tablet by mouth at bedtime as needed. Takes when xanax doesnt help      . Multiple Vitamins-Minerals (THERA-M) TABS Take 1 tablet by mouth daily.      . non-metallic deodorant Thornton Papas) MISC Apply 1 application topically daily as needed.      . traMADol (ULTRAM) 50 MG tablet Take by mouth every 6 (six) hours as needed.       No current facility-administered medications for this encounter.     ALLERGIES: Statins; Ace inhibitors; and Ciprofloxacin hcl   LABORATORY DATA:  Lab Results  Component Value Date   WBC 7.5 06/03/2013   HGB 13.6 06/03/2013   HCT 39.4 06/03/2013   MCV 86.0 06/03/2013   PLT 205 06/03/2013   Lab Results  Component Value Date   NA 141 06/03/2013   K 3.7 06/03/2013   CL 104 06/03/2013   CO2 27 06/03/2013   Lab Results  Component Value Date   ALT 16 06/03/2013   AST 15 06/03/2013   ALKPHOS 66 06/03/2013   BILITOT 0.3 06/03/2013     NARRATIVE: [name] was seen today for weekly treatment management. The chart was checked and the patient's films were reviewed. The patient states that she is doing well. She began taking Neurontin which has  significantly helped with some pain.  PHYSICAL EXAMINATION: weight is 147 lb 8 oz (66.906 kg). Her blood pressure is 141/88 and her pulse is 69. Her respiration is 16.      the patient's skin looks quite good. Some diffuse erythema present. No significant desquamation.  ASSESSMENT: The patient is doing satisfactorily with treatment.  PLAN: We will continue with the patient's radiation treatment as planned.

## 2013-08-25 ENCOUNTER — Ambulatory Visit
Admission: RE | Admit: 2013-08-25 | Discharge: 2013-08-25 | Disposition: A | Discharge status: Home / Self Care | Payer: Medicaid Other | Source: Ambulatory Visit | Attending: Radiation Oncology | Admitting: Radiation Oncology

## 2013-08-26 ENCOUNTER — Ambulatory Visit
Admission: RE | Admit: 2013-08-26 | Discharge: 2013-08-26 | Disposition: A | Discharge status: Home / Self Care | Payer: Medicaid Other | Source: Ambulatory Visit | Attending: Radiation Oncology | Admitting: Radiation Oncology

## 2013-08-26 ENCOUNTER — Other Ambulatory Visit (HOSPITAL_BASED_OUTPATIENT_CLINIC_OR_DEPARTMENT_OTHER): Payer: Medicaid Other

## 2013-08-26 DIAGNOSIS — C7A010 Malignant carcinoid tumor of the duodenum: Secondary | ICD-10-CM

## 2013-08-26 DIAGNOSIS — C50512 Malignant neoplasm of lower-outer quadrant of left female breast: Secondary | ICD-10-CM

## 2013-08-26 LAB — COMPREHENSIVE METABOLIC PANEL (CC13)
Albumin: 3.8 g/dL (ref 3.5–5.0)
Anion Gap: 8 mEq/L (ref 3–11)
CO2: 28 mEq/L (ref 22–29)
Calcium: 10 mg/dL (ref 8.4–10.4)
Chloride: 107 mEq/L (ref 98–109)
Glucose: 86 mg/dl (ref 70–140)
Potassium: 5 mEq/L (ref 3.5–5.1)
Sodium: 143 mEq/L (ref 136–145)
Total Bilirubin: 0.37 mg/dL (ref 0.20–1.20)
Total Protein: 7 g/dL (ref 6.4–8.3)

## 2013-08-26 LAB — CBC WITH DIFFERENTIAL/PLATELET
Eosinophils Absolute: 0.2 10*3/uL (ref 0.0–0.5)
HCT: 42.3 % (ref 34.8–46.6)
LYMPH%: 18.3 % (ref 14.0–49.7)
MONO#: 0.4 10*3/uL (ref 0.1–0.9)
NEUT#: 3.7 10*3/uL (ref 1.5–6.5)
NEUT%: 70.5 % (ref 38.4–76.8)
Platelets: 183 10*3/uL (ref 145–400)
RBC: 4.85 10*6/uL (ref 3.70–5.45)
RDW: 13.7 % (ref 11.2–14.5)
WBC: 5.3 10*3/uL (ref 3.9–10.3)

## 2013-08-26 MED ORDER — ALRA NON-METALLIC DEODORANT (RAD-ONC)
1.0000 "application " | Freq: Once | TOPICAL | Status: AC
  Administered 2013-08-26: 1 via TOPICAL

## 2013-08-27 ENCOUNTER — Ambulatory Visit
Admission: RE | Admit: 2013-08-27 | Discharge: 2013-08-27 | Disposition: A | Discharge status: Home / Self Care | Payer: Medicaid Other | Source: Ambulatory Visit | Attending: Radiation Oncology | Admitting: Radiation Oncology

## 2013-08-28 ENCOUNTER — Ambulatory Visit: Payer: Medicaid Other

## 2013-08-28 ENCOUNTER — Telehealth: Payer: Self-pay | Admitting: *Deleted

## 2013-08-28 ENCOUNTER — Ambulatory Visit
Admission: RE | Admit: 2013-08-28 | Discharge: 2013-08-28 | Disposition: A | Discharge status: Home / Self Care | Payer: Medicaid Other | Source: Ambulatory Visit | Attending: Radiation Oncology | Admitting: Radiation Oncology

## 2013-08-28 ENCOUNTER — Encounter: Payer: Self-pay | Admitting: Oncology

## 2013-08-28 ENCOUNTER — Ambulatory Visit (HOSPITAL_BASED_OUTPATIENT_CLINIC_OR_DEPARTMENT_OTHER): Payer: Medicaid Other | Admitting: Oncology

## 2013-08-28 VITALS — BP 143/88 | HR 67 | Temp 98.2°F | Resp 20 | Ht 66.0 in | Wt 149.7 lb

## 2013-08-28 DIAGNOSIS — D0590 Unspecified type of carcinoma in situ of unspecified breast: Secondary | ICD-10-CM

## 2013-08-28 DIAGNOSIS — N6320 Unspecified lump in the left breast, unspecified quadrant: Secondary | ICD-10-CM

## 2013-08-28 DIAGNOSIS — M8580 Other specified disorders of bone density and structure, unspecified site: Secondary | ICD-10-CM

## 2013-08-28 DIAGNOSIS — C50512 Malignant neoplasm of lower-outer quadrant of left female breast: Secondary | ICD-10-CM

## 2013-08-28 DIAGNOSIS — D0510 Intraductal carcinoma in situ of unspecified breast: Secondary | ICD-10-CM

## 2013-08-28 DIAGNOSIS — D3A019 Benign carcinoid tumor of the small intestine, unspecified portion: Secondary | ICD-10-CM

## 2013-08-28 MED ORDER — EXEMESTANE 25 MG PO TABS: 25.0000 mg | ORAL_TABLET | Freq: Every day | ORAL | Status: DC

## 2013-08-28 MED ORDER — TRAMADOL HCL 50 MG PO TABS: 50.0000 mg | ORAL_TABLET | Freq: Four times a day (QID) | ORAL | Status: DC | PRN

## 2013-08-28 NOTE — Telephone Encounter (Signed)
appts made and printed. Pt is aware that i emailed [facility] to enter the order for her bone density...td

## 2013-08-28 NOTE — Patient Instructions (Signed)
Exemestane tablets  What is this medicine?  EXEMESTANE (ex e MES tane) blocks the production of the hormone estrogen. Some types of breast cancer depend on estrogen to grow, and this medicine can stop tumor growth by blocking estrogen production. This medicine is for the treatment of breast cancer in postmenopausal women only.  This medicine may be used for other purposes; ask your health care provider or pharmacist if you have questions.  COMMON BRAND NAME(S): Aromasin  What should I tell my health care provider before I take this medicine?  They need to know if you have any of these conditions:  -an unusual or allergic reaction to exemestane, other medicines, foods, dyes, or preservatives  -pregnant or trying to get pregnant  -breast-feeding  How should I use this medicine?  Take this medicine by mouth with a glass of water. Follow the directions on the prescription label. Take your doses at regular intervals after a meal. Do not take your medicine more often than directed. Do not stop taking except on the advice of your doctor or health care professional.  Contact your pediatrician regarding the use of this medicine in children. Special care may be needed.  Overdosage: If you think you have taken too much of this medicine contact a poison control center or emergency room at once.  NOTE: This medicine is only for you. Do not share this medicine with others.  What if I miss a dose?  If you miss a dose, take the next dose as usual. Do not try to make up the missed dose. Do not take double or extra doses.  What may interact with this medicine?  Do not take this medicine with any of the following medications:  -female hormones, like estrogens and birth control pills  This medicine may also interact with the following medications:  -androstenedione  -phenytoin  -rifabutin, rifampin, or rifapentine  -St. John's Wort  This list may not describe all possible interactions. Give your health care provider a list of all the  medicines, herbs, non-prescription drugs, or dietary supplements you use. Also tell them if you smoke, drink alcohol, or use illegal drugs. Some items may interact with your medicine.  What should I watch for while using this medicine?  Visit your doctor or health care professional for regular checks on your progress.  If you experience hot flashes or sweating while taking this medicine, avoid alcohol, smoking and drinks with caffeine. This may help to decrease these side effects.  What side effects may I notice from receiving this medicine?  Side effects that you should report to your doctor or health care professional as soon as possible:  -any new or unusual symptoms  -changes in vision  -fever  -leg or arm swelling  -pain in bones, joints, or muscles  -pain in hips, back, ribs, arms, shoulders, or legs  Side effects that usually do not require medical attention (report to your doctor or health care professional if they continue or are bothersome):  -difficulty sleeping  -headache  -hot flashes  -sweating  -unusually weak or tired  This list may not describe all possible side effects. Call your doctor for medical advice about side effects. You may report side effects to FDA at 1-800-FDA-1088.  Where should I keep my medicine?  Keep out of the reach of children.  Store at room temperature between 15 and 30 degrees C (59 and 86 degrees F). Throw away any unused medicine after the expiration date.  NOTE: This sheet   is a summary. It may not cover all possible information. If you have questions about this medicine, talk to your doctor, pharmacist, or health care provider.   2014, [employer] Standard. (2007-12-31 11:48:29)

## 2013-08-28 NOTE — Progress Notes (Signed)
[name] [id number] 1956-06-07 57 y.o. 08/28/2013 5:06 PM  CC  [name], MD [address] [place] [address] Dr. 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 Abigail Miyamoto  Prior oncologic history:  [name] is a 57 y.o. female.    #1 Patient noted a left breast mass she underwent ultrasound-guided biopsy on 12/06/2011 that showed a fibroadenoma. Recently she had enlargement in the left breast that was associated with some discomfort. She went on to have an excision performed on 04/02/2013. The pathology revealed a fibroadenoma with extensive involvement with low-grade ductal carcinoma in situ. The tumor was 3.5 cm in dimension. The ductal carcinoma in situ extended to less than 0.1 cm from the inked margin. Patient had MRI of the breast performed post R. This demonstrated enlarging mass in the inferior left breast representing a seroma there was also a fibroadenoma within the lower inner quadrant of left breast adjacent to the seroma no malignancies in the right side no axillary adenopathy.  #2 Patient also has had a exploratory laparotomy after having small bowel obstruction. She had a resection performed that showed a 2 cm well-differentiated  Neuroendocrine tumor with 2 of 5 lymph nodes positive. T3 N1 tumor. Postoperatively she is doing well for that as well.  #3 status post radiation therapy to the left breast to be completed 09/05/2013.  #4 post radiation begin tamoxifen 20 mg daily starting January 2015  Current therapy: Radiation  therapy to be completed 09/05/2013 then she will begin tamoxifen 20 mg daily starting January 2015  Interval history: Patient is seen in followup overall she's doing well. She's tolerating the radiation quite nicely except for some redness and erythema with some tenderness at the surgical site do to irritation from the radiation. She denies any nausea vomiting fevers chills night sweats headaches no shortness of breath or chest pains or palpitations. Remainder of the 10 point review of systems is negative.   Past Medical History: Past Medical History  Diagnosis Date  . Hepatitis C   . Jaundice   . Breast disorder     lump in left breast  . TIA (transient ischemic attack) OCT 2012  . Hyperlipidemia   . Varicose veins   . Adjustment disorder with mixed anxiety and depressed mood 01/05/2013  . Allergy   . Anxiety   . Arthritis     knees,wrists   . Pain management     goes to [facility] clinic, but she doesn't intend to return   . H/O echocardiogram     not finding report in EPIC, pt. reports that it was before her appt. /w Dr. Eden Emms   . Stroke 2012    TIA  . Brain aneurysm 2012    2cms.  . Complication of anesthesia     hallucinations , anxiety   . Malignant neoplasm of lower-outer quadrant of female breast 05/19/2013    Past Surgical History: Past Surgical History  Procedure Laterality Date  . Laparoscopy N/A 04/02/2013    Procedure: Diaganostic Lap.; Small Bowel Resection;  Surgeon: Shelly Rubenstein, MD;  Location: [facility]  OR;  Service: General;  Laterality: N/A;  . Breast lumpectomy Left 04/02/2013    Procedure: Excision  of Left Breast Mass;  Surgeon: Shelly Rubenstein, MD;  Location: [facility] OR;  Service: General;  Laterality: Left;  . Colon surgery  03/2013    bowel obstruction , malignant tumor    . Vaginal births   26     birthed twins at -[redacted] weeks gestation - both survived , total of 3 pregnancies - all born vaginally  . Breast lumpectomy with sentinel lymph node biopsy Left  06/05/2013    Procedure: BREAST LUMPECTOMY WITH SENTINEL LYMPH NODE BX;  Surgeon: Shelly Rubenstein, MD;  Location: [facility] OR;  Service: General;  Laterality: Left;  . Evacuation breast hematoma Left 06/05/2013    Procedure: EVACUATION HEMATOMA BREAST;  Surgeon: Shelly Rubenstein, MD;  Location: [facility] OR;  Service: General;  Laterality: Left;    Family History: Family History  Problem Relation Age of Onset  . Diabetes Paternal Grandfather   . Diabetes Paternal Grandmother   . Cancer Father     lung  . Diabetes Mother   . Heart disease Mother   . Cancer Mother     cervical  . Diabetes Brother   . Stroke Brother   . Heart disease Sister   . Diabetes Sister   . Cancer Sister     cervical    Social History History  Substance Use Topics  . Smoking status: Current Every Day Smoker -- 0.50 packs/day for 35 years  . Smokeless tobacco: Never Used     Comment: cut down to  10 cigarettes daily 06/25/13  . Alcohol Use: Yes     Comment: seldom- rare    Allergies: Allergies  Allergen Reactions  . Statins Swelling    Stomach swelling & muscle ache  . Ace Inhibitors Other (See Comments)    Other reaction(s): Palpitations  . Ciprofloxacin Hcl Palpitations    Current Medications: Current Outpatient Prescriptions  Medication Sig Dispense Refill  . ALPRAZolam (XANAX) 1 MG tablet Take 1 mg by mouth 3 (three) times daily.      Marland Kitchen aspirin EC 81 MG tablet Take 81 mg by mouth 2 (two) times daily.      . diphenhydramine-acetaminophen (TYLENOL PM) 25-500 MG TABS Take 1 tablet by mouth at bedtime as needed.      . hyaluronate sodium (RADIAPLEXRX) GEL Apply 1 application topically once.      . Multiple Vitamins-Minerals (THERA-M) TABS Take 1 tablet by mouth daily.      . non-metallic deodorant Thornton Papas) MISC Apply 1 application topically daily as needed.      . traMADol (ULTRAM) 50 MG tablet Take 1 tablet (50 mg total) by mouth every 6 (six) hours as needed.  90 tablet  1  . exemestane (AROMASIN) 25  MG tablet Take 1 tablet (25 mg total) by mouth daily after breakfast.  90 tablet  3   No current facility-administered medications for this visit.    OB/GYN History:menarche at age 26 she underwent menopause in 2001 she said 3 live births first live birth was at 46  Fertility Discussion: n/a Prior History of Cancer: no  Health Maintenance:  Colonoscopy 2014 Bone Density no Last PAP smear 2012  ECOG PERFORMANCE STATUS: 1 - Symptomatic but completely ambulatory  Genetic Counseling/testing: no  REVIEW OF SYSTEMS:  Constitutional: positive for fatigue and night sweats Ears, nose, mouth, throat, and face: negative Respiratory: negative Cardiovascular: negative Gastrointestinal: positive for nausea and reflux  symptoms Genitourinary:negative Integument/breast: positive for breast tenderness Hematologic/lymphatic: negative Musculoskeletal:positive for arthralgias Neurological: negative  PHYSICAL EXAMINATION: Blood pressure 143/88, pulse 67, temperature 98.2 F (36.8 C), temperature source Oral, resp. rate 20, height 5\' 6"  (1.676 m), weight 149 lb 11.2 oz (67.903 kg).  WUJ:WJXBJ, healthy, no distress, well nourished, well developed and anxious SKIN: skin color, texture, turgor are normal HEAD: Normocephalic EYES: PERRLA, EOMI, Conjunctiva are pink and non-injected EARS: External ears normal OROPHARYNX:no exudate, no erythema and dentition normal  NECK: supple, no adenopathy, thyroid normal size, non-tender, without nodularity LYMPH:  no palpable lymphadenopathy, no hepatosplenomegaly, no cervical supraclavicular or axillary adenopathy BREAST:right breast normal without mass, skin or nipple changes or axillary nodes, surgical scars noted well-healed in the left breast LUNGS: clear to auscultation and percussion HEART: regular rate & rhythm ABDOMEN:abdomen soft, non-tender, normal bowel sounds, no masses or organomegaly and surgical scar well healing BACK: Back symmetric, no  curvature., No CVA tenderness, Range of motion is normal EXTREMITIES:no edema, no clubbing, no cyanosis  NEURO: alert & oriented x 3 with fluent speech, no focal motor/sensory deficits, gait normal, reflexes normal and symmetric     STUDIES/RESULTS: Ct Chest W Contrast  05/23/2013   CLINICAL DATA:  Followup evaluation for carcinoid tumor of the duodenum.  EXAM: CT CHEST, ABDOMEN, AND PELVIS WITH CONTRAST  TECHNIQUE: Multidetector CT imaging of the chest, abdomen and pelvis was performed following the standard protocol during bolus administration of intravenous contrast.  CONTRAST:  OMNIPAQUE IOHEXOL 300 MG/ML  SOLN  COMPARISON:  CT of the abdomen and pelvis 03/31/2013.  FINDINGS: CT CHEST FINDINGS  Mediastinum: Heart size is normal. There is no significant pericardial fluid, thickening or pericardial calcification. No pathologically enlarged mediastinal or hilar lymph nodes. Esophagus is unremarkable in appearance.  Lungs/Pleura: No suspicious appearing pulmonary nodules or masses are identified at this time. No acute consolidative airspace disease. No pleural effusions. Mild centrilobular and paraseptal emphysema, most apparent in the lung apices.  Musculoskeletal: There are no aggressive appearing lytic or blastic lesions noted in the visualized portions of the skeleton. Surgical clips in the left breast, presumably from prior lumpectomy. The previously noted large nodule in the left breast has been removed, however, inferior to the resection site, there are at least 2 areas of soft tissue nodularity which appear new compared the prior study, best demonstrated on image 38 of series 2, the largest of which measures approximately 1.2 cm. These are nonspecific.  CT ABDOMEN AND PELVIS FINDINGS  Abdomen/Pelvis: The appearance of the liver, gallbladder, pancreas, spleen, bilateral adrenal glands and bilateral kidneys is unremarkable. The duodenum is grossly normal in appearance on today's examination. No  significant volume of ascites. No pneumoperitoneum. No pathologic distention of small bowel. Normal appendix. No definite pathologic lymphadenopathy identified within the abdomen or pelvis. Atherosclerosis throughout the abdominal and pelvic vasculature, without definite aneurysm or dissection. Uterus and ovaries are unremarkable in appearance. Urinary bladder is normal in appearance.  Musculoskeletal: There are no aggressive appearing lytic or blastic lesions noted in the visualized portions of the skeleton.  IMPRESSION: CT CHEST IMPRESSION  1. No signs of metastatic disease to the lungs at this time. 2. Status post left breast lumpectomy. Adjacent to the lumpectomy site there are 2 areas of nodularity inferiorly which are nonspecific. These may represent areas of postoperative scarring, however, the possibility of residual or recurrent disease in this region is not excluded. Correlation with mammography and or breast ultrasound may be warranted if clinically indicated. 3. Mild centrilobular  and paraseptal emphysema.  CT ABDOMEN AND PELVIS IMPRESSION  1. No definite evidence to suggest local recurrence of disease or metastatic disease in the abdomen or pelvis on today's examination. 2. Extensive atherosclerosis.   Electronically Signed   By: Trudie Reed M.D.   On: 05/23/2013 08:56   Ct Abdomen Pelvis W Contrast  05/23/2013   CLINICAL DATA:  Followup evaluation for carcinoid tumor of the duodenum.  EXAM: CT CHEST, ABDOMEN, AND PELVIS WITH CONTRAST  TECHNIQUE: Multidetector CT imaging of the chest, abdomen and pelvis was performed following the standard protocol during bolus administration of intravenous contrast.  CONTRAST:  OMNIPAQUE IOHEXOL 300 MG/ML  SOLN  COMPARISON:  CT of the abdomen and pelvis 03/31/2013.  FINDINGS: CT CHEST FINDINGS  Mediastinum: Heart size is normal. There is no significant pericardial fluid, thickening or pericardial calcification. No pathologically enlarged mediastinal or  hilar lymph nodes. Esophagus is unremarkable in appearance.  Lungs/Pleura: No suspicious appearing pulmonary nodules or masses are identified at this time. No acute consolidative airspace disease. No pleural effusions. Mild centrilobular and paraseptal emphysema, most apparent in the lung apices.  Musculoskeletal: There are no aggressive appearing lytic or blastic lesions noted in the visualized portions of the skeleton. Surgical clips in the left breast, presumably from prior lumpectomy. The previously noted large nodule in the left breast has been removed, however, inferior to the resection site, there are at least 2 areas of soft tissue nodularity which appear new compared the prior study, best demonstrated on image 38 of series 2, the largest of which measures approximately 1.2 cm. These are nonspecific.  CT ABDOMEN AND PELVIS FINDINGS  Abdomen/Pelvis: The appearance of the liver, gallbladder, pancreas, spleen, bilateral adrenal glands and bilateral kidneys is unremarkable. The duodenum is grossly normal in appearance on today's examination. No significant volume of ascites. No pneumoperitoneum. No pathologic distention of small bowel. Normal appendix. No definite pathologic lymphadenopathy identified within the abdomen or pelvis. Atherosclerosis throughout the abdominal and pelvic vasculature, without definite aneurysm or dissection. Uterus and ovaries are unremarkable in appearance. Urinary bladder is normal in appearance.  Musculoskeletal: There are no aggressive appearing lytic or blastic lesions noted in the visualized portions of the skeleton.  IMPRESSION: CT CHEST IMPRESSION  1. No signs of metastatic disease to the lungs at this time. 2. Status post left breast lumpectomy. Adjacent to the lumpectomy site there are 2 areas of nodularity inferiorly which are nonspecific. These may represent areas of postoperative scarring, however, the possibility of residual or recurrent disease in this region is not  excluded. Correlation with mammography and or breast ultrasound may be warranted if clinically indicated. 3. Mild centrilobular and paraseptal emphysema.  CT ABDOMEN AND PELVIS IMPRESSION  1. No definite evidence to suggest local recurrence of disease or metastatic disease in the abdomen or pelvis on today's examination. 2. Extensive atherosclerosis.   Electronically Signed   By: Trudie Reed M.D.   On: 05/23/2013 08:56     LABS:    Chemistry      Component Value Date/Time   NA 143 08/26/2013 0807   NA 141 06/03/2013 1549   K 5.0 08/26/2013 0807   K 3.7 06/03/2013 1549   CL 104 06/03/2013 1549   CO2 28 08/26/2013 0807   CO2 27 06/03/2013 1549   BUN 13.7 08/26/2013 0807   BUN 9 06/03/2013 1549   CREATININE 0.8 08/26/2013 0807   CREATININE 0.91 06/03/2013 1549      Component Value Date/Time   CALCIUM 10.0  08/26/2013 0807   CALCIUM 9.1 06/03/2013 1549   ALKPHOS 74 08/26/2013 0807   ALKPHOS 66 06/03/2013 1549   AST 16 08/26/2013 0807   AST 15 06/03/2013 1549   ALT 16 08/26/2013 0807   ALT 16 06/03/2013 1549   BILITOT 0.37 08/26/2013 0807   BILITOT 0.3 06/03/2013 1549      Lab Results  Component Value Date   WBC 5.3 08/26/2013   HGB 13.9 08/26/2013   HCT 42.3 08/26/2013   MCV 87.2 08/26/2013   PLT 183 08/26/2013   PATHOLOGY: ADDITIONAL INFORMATION: 1. PROGNOSTIC INDICATORS - ACIS Results: IMMUNOHISTOCHEMICAL AND MORPHOMETRIC ANALYSIS BY THE AUTOMATED CELLULAR IMAGING SYSTEM (ACIS) Estrogen Receptor: 100%, POSITIVE, STRONG STAINING INTENSITY Progesterone Receptor: 100%, POSITIVE, STRONG STAINING INTENSITY REFERENCE RANGE ESTROGEN RECEPTOR NEGATIVE <1% POSITIVE =>1% PROGESTERONE RECEPTOR NEGATIVE <1% POSITIVE =>1% All controls stained appropriately Pecola Leisure MD Pathologist, Electronic Signature ( Signed 04/07/2013) FINAL DIAGNOSIS Diagnosis 1. Breast, excision, Left - FIBROADENOMA WITH EXTENSIVE INVOLVEMENT WITH LOW GRADE CARCINOMA IN SITU. - ESTIMATED TUMOR SIZE  IS 3.5 CM IN GREATEST DIMENSION. - LOW GRADE DUCTAL CARCINOMA IN SITU LESS THAN 0.1 CM FROM INKED MARGIN. - SEE ONCOLOGY TEMPLATE. 2. Small intestine, resection, Small - INVASIVE WELL DIFFERENTIATED NEUROENDOCRINE TUMOR (CARCINOID), SPANNING 2 CM IN GREATEST 1 of 3 Duplicate copy FINAL for [name], [name] [phone]) Diagnosis(continued) DIMENSION. - TUMOR INVADES THROUGH MUSCULARIS PROPRIA INTO SUBSEROSAL TISSUES. - MARGINS ARE NEGATIVE. - TWO OF FIVE LYMPH NODES ARE POSITIVE FOR METASTATIC WELL DIFFERENTIATED NEUROENDOCRINE TUMOR (2/5). - SEE ONCOLOGY TEMPLATE. Microscopic Comment 1. BREAST, IN SITU CARCINOMA Specimen, including laterality: Left partial breast. Procedure: Left breast excision. Grade of carcinoma: Low grade. Necrosis: No. Estimated tumor size: (based on gross measurement): 3.5 cm. Treatment effect: N/A. Distance to closest margin: Less than 0.1 cm from unoriented inked margin. Breast prognostic profile: Not previously performed, will be performed on current case. Lymph nodes: Examined: 0. TNM: pTis, pNX. Comments: A Cytokeratin 5/6 immunohistochemical stain is performed on a representative block. The areas of epithelial proliferation fail to stain for cytokeratin 5/6. The morphology coupled with the staining pattern is consistent with the above diagnosis. A quantitative estrogen receptor and progesterone receptor will be performed on a representative block and reported in an addendum. Dr. Colonel Bald has seen the first specimen in consultation with agreement that the findings represent a fibroadenoma with low grade ductal carcinoma in situ. (RAH:caf 04/04/13) 2. SMALL INTESTINE NEUROENDOCRINE TUMOR Histologic Type: Well differentiated neuroendocrine tumor (carcinoid). Histologic Grade: Well differentiated/low grade. Microscopic Tumor Extension: Tumor extends through muscularis propria into subserosal tissues. Resection Margins: All margins are negative including  proximal and distal margins. Lymph-Vascular invasion: Definitive lymph-vascular invasion is not identified; however, lymph nodes are positive, see below. Perineural Invasion: Not identified. Lymph nodes: 2 of 5 lymph nodes positive for metastatic tumor. TNM code: pT3, pN1, MX. Additional Pathologic Findings: No additional significant pathologic abnormalities. Ancillary Studies: Can be performed upon request. Clinical History: Small bowel obstruction with small bowel mass. Comments: Immunohistochemical stains are performed. The tumor is positive for cytokeratin AE1/AE3, chromogranin, synaptophysin, and CD56. The morphology coupled with the staining pattern is consistent with the above diagnosis. Dr. Colonel Bald is in agreement that the tumor extends through muscularis propria (pT3). (RH:ecj 04/03/2013)   Assessment and plan    57 year old female with  #1 new diagnosis of stage 0 ductal carcinoma in situ of the left breast measuring 3.5 cm ER/PR positive. Patient is status post lumpectomy. She does have a close margin. If she will  require a reexcision given the close margin.  #2 patient and I discussed adjuvant treatment of DCIS with antiestrogen therapy. We discussed role of tamoxifen in this setting. We discussed the side effects of this as well today.  #3 patient is continuing radiation therapy currently. She will complete this on 09/05/2013. After which patient will start tamoxifen 20 mg daily for a total of 5 years curative intent.  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 We will need to continue to follow this as well.      Discussion: Patient is being treated per NCCN breast cancer care guidelines appropriate for stage.0 for breast cancer Stage II for neuroendocrine tumor   Thank you so much for allowing me to participate in the care of [name]. I will continue to  follow up the patient with you and assist in her care.  All questions were answered. The patient knows to call the clinic with any problems, questions or concerns. We can certainly see the patient much sooner if necessary.  I spent 25 minutes counseling the patient face to face. The total time spent in the appointment was 30 minutes.  Drue Second, MD Medical/Oncology [facility] [phone] (beeper) [phone] (Office)

## 2013-08-28 NOTE — Telephone Encounter (Signed)
sw pt gv appt for 09/30/13 @ 10am for her bone density...td

## 2013-08-29 ENCOUNTER — Ambulatory Visit
Admission: RE | Admit: 2013-08-29 | Discharge: 2013-08-29 | Disposition: A | Discharge status: Home / Self Care | Payer: Medicaid Other | Source: Ambulatory Visit | Attending: Radiation Oncology | Admitting: Radiation Oncology

## 2013-08-29 ENCOUNTER — Encounter: Payer: Self-pay | Admitting: Radiation Oncology

## 2013-08-29 ENCOUNTER — Ambulatory Visit: Payer: Medicaid Other

## 2013-08-29 VITALS — BP 130/90 | HR 60 | Temp 98.0°F | Resp 20 | Wt 148.1 lb

## 2013-08-29 DIAGNOSIS — C50512 Malignant neoplasm of lower-outer quadrant of left female breast: Secondary | ICD-10-CM

## 2013-08-29 NOTE — Progress Notes (Signed)
   Department of Radiation Oncology  Phone:  [phone] Fax:        [phone]  Weekly Treatment Note    Name: [name] Date: 08/29/2013 MRN: [id number] DOB: 11/23/1955   Current dose: 52.4 Gy  Current fraction: 29   MEDICATIONS: Current Outpatient Prescriptions  Medication Sig Dispense Refill  . ALPRAZolam (XANAX) 1 MG tablet Take 1 mg by mouth 3 (three) times daily.      Marland Kitchen aspirin EC 81 MG tablet Take 81 mg by mouth 2 (two) times daily.      . diphenhydramine-acetaminophen (TYLENOL PM) 25-500 MG TABS Take 1 tablet by mouth at bedtime as needed.      Marland Kitchen exemestane (AROMASIN) 25 MG tablet Take 1 tablet (25 mg total) by mouth daily after breakfast.  90 tablet  3  . hyaluronate sodium (RADIAPLEXRX) GEL Apply 1 application topically once.      . Multiple Vitamins-Minerals (THERA-M) TABS Take 1 tablet by mouth daily.      . non-metallic deodorant Thornton Papas) MISC Apply 1 application topically daily as needed.      . traMADol (ULTRAM) 50 MG tablet Take 1 tablet (50 mg total) by mouth every 6 (six) hours as needed.  90 tablet  1   No current facility-administered medications for this encounter.     ALLERGIES: Statins; Ace inhibitors; and Ciprofloxacin hcl   LABORATORY DATA:  Lab Results  Component Value Date   WBC 5.3 08/26/2013   HGB 13.9 08/26/2013   HCT 42.3 08/26/2013   MCV 87.2 08/26/2013   PLT 183 08/26/2013   Lab Results  Component Value Date   NA 143 08/26/2013   K 5.0 08/26/2013   CL 104 06/03/2013   CO2 28 08/26/2013   Lab Results  Component Value Date   ALT 16 08/26/2013   AST 16 08/26/2013   ALKPHOS 74 08/26/2013   BILITOT 0.37 08/26/2013     NARRATIVE: [name] was seen today for weekly treatment management. The chart was checked and the patient's films were reviewed. The patient is doing well with her treatment. No new complaints. A little bit of irritation of the skin in the treatment area.  PHYSICAL EXAMINATION: weight is 148 lb 1.6  oz (67.178 kg). Her oral temperature is 98 F (36.7 C). Her blood pressure is 130/90 and her pulse is 60. Her respiration is 20.      the patient's skin looks quite good. Diffuse erythema. No significant desquamation.  ASSESSMENT: The patient is doing satisfactorily with treatment.  PLAN: We will continue with the patient's radiation treatment as planned. We discussed once again her plan to forego the final treatment. She has been steadfast in one to do this. I have offered twice a day treatments to go ahead and get an the planned 33 treatments but the patient does not wish to do this. Therefore she will finish her treatment next Wednesday after 32 fractions.

## 2013-08-29 NOTE — Progress Notes (Signed)
Weekly rad txs lt breast 29/33 completed, bright erythema  Skin intact, radiaplex bid,   Soreness and burning some, taking ultram helps, 11:06 AM

## 2013-09-01 ENCOUNTER — Ambulatory Visit
Admission: RE | Admit: 2013-09-01 | Discharge: 2013-09-01 | Disposition: A | Discharge status: Home / Self Care | Payer: Medicaid Other | Source: Ambulatory Visit | Attending: Radiation Oncology | Admitting: Radiation Oncology

## 2013-09-02 ENCOUNTER — Ambulatory Visit
Admission: RE | Admit: 2013-09-02 | Discharge: 2013-09-02 | Disposition: A | Discharge status: Home / Self Care | Payer: Medicaid Other | Source: Ambulatory Visit | Attending: Radiation Oncology | Admitting: Radiation Oncology

## 2013-09-03 ENCOUNTER — Encounter: Payer: Self-pay | Admitting: Radiation Oncology

## 2013-09-03 ENCOUNTER — Ambulatory Visit: Payer: Medicaid Other

## 2013-09-03 ENCOUNTER — Ambulatory Visit
Admission: RE | Admit: 2013-09-03 | Discharge: 2013-09-03 | Disposition: A | Discharge status: Home / Self Care | Payer: Medicaid Other | Source: Ambulatory Visit | Attending: Radiation Oncology | Admitting: Radiation Oncology

## 2013-09-03 VITALS — BP 141/81 | HR 65 | Temp 98.0°F | Resp 20 | Wt 148.5 lb

## 2013-09-03 DIAGNOSIS — D0512 Intraductal carcinoma in situ of left breast: Secondary | ICD-10-CM

## 2013-09-03 NOTE — Progress Notes (Signed)
Pt states she discussed w/Dr Mitzi Hansen the fact that she doesn't want to receive her last radiation treatment scheduled on 09/05/13. She states Dr Welton Flakes is aware also. She denies pain, does have occasional sharp pains in her left breast. She is applying Radioaplex to left breast for hyperpigmentation, denies desquamation. Gave pt 1 month FU card.

## 2013-09-03 NOTE — Progress Notes (Addendum)
Weekly Management Note Current Dose:  58.4 Gy  Projected Dose: 60.4 Gy   Narrative:  The patient presents for routine under treatment assessment.  CBCT/MVCT images/Port film x-rays were reviewed.  The chart was checked. Refuses last treatment. Some minor skin irritation.   Physical Findings: Weight: 148 lb 8 oz (67.359 kg). Dark left breast. No desquamation.   Impression:  Finishes RT today.   Plan:  Follow up in 1 month. Continue radiaplex.

## 2013-09-03 NOTE — Addendum Note (Signed)
Encounter addended by: Lurline Hare, MD on: 09/03/2013  7:38 AM<BR>     Documentation filed: Notes Section

## 2013-09-05 ENCOUNTER — Ambulatory Visit: Payer: Medicaid Other

## 2013-09-05 ENCOUNTER — Ambulatory Visit: Payer: Medicaid Other | Admitting: Radiation Oncology

## 2013-09-12 ENCOUNTER — Ambulatory Visit (INDEPENDENT_AMBULATORY_CARE_PROVIDER_SITE_OTHER): Payer: Medicaid Other | Admitting: Surgery

## 2013-09-14 NOTE — Progress Notes (Signed)
  Radiation Oncology         [phone] ________________________________  Name: [name] MRN: [id number]  Date: 08/21/2013  DOB: May 02, 1956  Complex simulation note  The patient has undergone complex simulation for her upcoming boost treatment for her diagnosis of breast cancer. The patient has initially been planned to receive 50.4 Gy. The patient will now receive a 10 Gy boost to the seroma cavity which has been contoured. This will be accomplished using an en face electron field. Based on the depth of the target area, 15 MeV electrons will be used and this field has been normalized to the 97 % isodose line. The patient's final total dose therefore will be 60.4 Gy. A special port plan is requested for the boost treatment.   _______________________________  Jodelle Gross, MD, PhD

## 2013-09-14 NOTE — Progress Notes (Signed)
  Radiation Oncology         [phone] ________________________________  Name: [name] MRN: [id number]  Date: 09/03/2013  DOB: 05-19-1956  End of Treatment Note  Diagnosis:   DCIS of the left breast     Indication for treatment:  Curative       Radiation treatment dates:   07/17/2013 through 09/03/2013  Site/dose:   The patient was treated to the left breast initially using whole breast tangent fields to a dose of 50.4 gray at 1.8 gray per fraction. This treatment consisted of a 3-D conformal technique. The patient then received a boost to the seroma for an additional 10 gray in 5 fractions. This was accomplished using an en face electron field. The patient's total dose was 60.4 gray.  Narrative: The patient tolerated radiation treatment relatively well.   The patient experienced some expected skin irritation in the treatment area during her course of radiation. This was managed with skin cream as appropriate. The patient decided not to complete her final fraction of radiation. We discussed this in detail and the patient was offered twice a day treatment to have her finish before Christmas. She declined this and rather completed 32/33 fractions.  Plan: The patient has completed radiation treatment. The patient will return to radiation oncology clinic for routine followup in one month. I advised the patient to call or return sooner if they have any questions or concerns related to their recovery or treatment. ________________________________  Jodelle Gross, M.D., Ph.D.

## 2013-09-30 ENCOUNTER — Ambulatory Visit
Admission: RE | Admit: 2013-09-30 | Discharge: 2013-09-30 | Disposition: A | Discharge status: Home / Self Care | Payer: Medicaid Other | Source: Ambulatory Visit | Attending: Oncology | Admitting: Oncology

## 2013-09-30 DIAGNOSIS — M8580 Other specified disorders of bone density and structure, unspecified site: Secondary | ICD-10-CM

## 2013-10-06 ENCOUNTER — Encounter (INDEPENDENT_AMBULATORY_CARE_PROVIDER_SITE_OTHER): Payer: Self-pay

## 2013-10-06 ENCOUNTER — Encounter (INDEPENDENT_AMBULATORY_CARE_PROVIDER_SITE_OTHER): Payer: Self-pay | Admitting: Surgery

## 2013-10-06 ENCOUNTER — Ambulatory Visit (INDEPENDENT_AMBULATORY_CARE_PROVIDER_SITE_OTHER): Payer: Medicaid Other | Admitting: Surgery

## 2013-10-06 ENCOUNTER — Encounter: Payer: Self-pay | Admitting: Radiation Oncology

## 2013-10-06 VITALS — BP 130/82 | HR 76 | Temp 98.1°F | Resp 14 | Ht 66.5 in | Wt 149.0 lb

## 2013-10-06 DIAGNOSIS — Z853 Personal history of malignant neoplasm of breast: Secondary | ICD-10-CM

## 2013-10-06 NOTE — Progress Notes (Signed)
Subjective:     Patient ID: [name], female   DOB: [birth year], 58 y.o.   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 She has finished radiation at the end of December. She is refusing to take anti-hormonal medications for the fear of blood clots and stroke. She will be seen her oncologist in April discuss this further. She currently has no complaints Review of Systems     Objective:   Physical Exam On exam, her incisions are well healed both on the breasts and abdomen. 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 I will see her back in one year

## 2013-10-09 ENCOUNTER — Ambulatory Visit: Admission: RE | Admit: 2013-10-09 | Payer: Medicaid Other | Source: Ambulatory Visit | Admitting: Radiation Oncology

## 2013-10-09 HISTORY — DX: Personal history of irradiation: Z92.3

## 2013-10-15 ENCOUNTER — Telehealth: Payer: Self-pay | Admitting: Oncology

## 2013-10-15 NOTE — Telephone Encounter (Signed)
Mailed pts medical records to [facility]

## 2013-10-16 ENCOUNTER — Ambulatory Visit
Admission: RE | Admit: 2013-10-16 | Discharge: 2013-10-16 | Disposition: A | Discharge status: Home / Self Care | Payer: Medicaid Other | Source: Ambulatory Visit | Attending: Radiation Oncology | Admitting: Radiation Oncology

## 2013-10-16 ENCOUNTER — Encounter: Payer: Self-pay | Admitting: Radiation Oncology

## 2013-10-16 VITALS — BP 135/89 | HR 61 | Temp 97.6°F | Resp 20 | Wt 150.7 lb

## 2013-10-16 DIAGNOSIS — C50519 Malignant neoplasm of lower-outer quadrant of unspecified female breast: Secondary | ICD-10-CM

## 2013-10-16 NOTE — Progress Notes (Signed)
Follow up  Rad tx left breast,, well healed, patient not taking aromasin stated patient, "I have the pills but I"m not taking them , too many side effects", appetite good, energy level still tired a lot stated patient, still smokes,  Occasionally, will take off nicotine patch when she takes a cigarette, my muscles ache all the time only taking IBuprofen now, stopped tramadol, made me have increased agitation,   10:36 AM

## 2013-10-16 NOTE — Progress Notes (Signed)
Radiation Oncology         [phone] ________________________________  Name: [name] MRN: [id number]  Date: 10/16/2013  DOB: November 24, 1955  Follow-Up Visit Note  CC: Margaret Noon, MD  Margaret Noon, MD  Diagnosis:   Left-sided breast cancer  Interval Since Last Radiation:  Approximately one month   Narrative:  The patient returns today for routine follow-up.  She has done well overall since she finished treatment. The patient's skin has healed significantly since she completed her course of radiation treatment. She has not begun anti-hormonal treatment.  The patient has been given a prescription but has not started this because of concern regarding side effects. She is going to schedule an earlier followup appointment with medical oncology to discuss this.                            ALLERGIES:  is allergic to statins; ace inhibitors; ciprofloxacin hcl; and tramadol.  Meds: Current Outpatient Prescriptions  Medication Sig Dispense Refill  . ALPRAZolam (XANAX) 1 MG tablet Take 1 mg by mouth 3 (three) times daily.      Marland Kitchen aspirin EC 81 MG tablet Take 81 mg by mouth 2 (two) times daily.      Marland Kitchen ibuprofen (ADVIL,MOTRIN) 200 MG tablet Take 200 mg by mouth every 6 (six) hours as needed.      . Multiple Vitamins-Minerals (THERA-M) TABS Take 1 tablet by mouth daily.      . nicotine (NICODERM CQ - DOSED IN MG/24 HOURS) 21 mg/24hr patch Place 1 patch onto the skin daily.      Marland Kitchen exemestane (AROMASIN) 25 MG tablet Take 1 tablet (25 mg total) by mouth daily after breakfast.  90 tablet  3   No current facility-administered medications for this encounter.    Physical Findings: The patient is in no acute distress. Patient is alert and oriented.  weight is 150 lb 11.2 oz (68.357 kg). Her oral temperature is 97.6 F (36.4 C). Her blood pressure is 135/89 and her pulse is 61. Her respiration is 20. .   The skin in the treatment area has healed satisfactorily, no areas of concern/moist  desquamation/poor healing  Lab Findings: Lab Results  Component Value Date   WBC 5.3 08/26/2013   HGB 13.9 08/26/2013   HCT 42.3 08/26/2013   MCV 87.2 08/26/2013   PLT 183 08/26/2013     Radiographic Findings: No results found.  Impression:    The patient has done satisfactorily since finishing treatment. She has not begun anti-hormonal treatment.  Plan:  The patient will followup in our clinic on a when necessary basis.   Jodelle Gross, M.D., Ph.D.

## 2013-10-22 ENCOUNTER — Ambulatory Visit (HOSPITAL_BASED_OUTPATIENT_CLINIC_OR_DEPARTMENT_OTHER): Payer: Medicaid Other | Admitting: Oncology

## 2013-10-22 ENCOUNTER — Telehealth: Payer: Self-pay | Admitting: *Deleted

## 2013-10-22 ENCOUNTER — Other Ambulatory Visit: Payer: Self-pay | Admitting: Oncology

## 2013-10-22 ENCOUNTER — Other Ambulatory Visit: Payer: Self-pay | Admitting: *Deleted

## 2013-10-22 VITALS — BP 135/85 | HR 66 | Temp 97.7°F | Resp 20 | Ht 66.5 in | Wt 152.8 lb

## 2013-10-22 DIAGNOSIS — R109 Unspecified abdominal pain: Secondary | ICD-10-CM

## 2013-10-22 DIAGNOSIS — D0590 Unspecified type of carcinoma in situ of unspecified breast: Secondary | ICD-10-CM

## 2013-10-22 DIAGNOSIS — D0510 Intraductal carcinoma in situ of unspecified breast: Secondary | ICD-10-CM

## 2013-10-22 DIAGNOSIS — C50519 Malignant neoplasm of lower-outer quadrant of unspecified female breast: Secondary | ICD-10-CM

## 2013-10-22 DIAGNOSIS — K7689 Other specified diseases of liver: Secondary | ICD-10-CM

## 2013-10-22 DIAGNOSIS — C7A019 Malignant carcinoid tumor of the small intestine, unspecified portion: Secondary | ICD-10-CM

## 2013-10-22 DIAGNOSIS — Z170 Estrogen receptor positive status [ER+]: Secondary | ICD-10-CM

## 2013-10-22 NOTE — Telephone Encounter (Signed)
appts made and printed. Pt is aware that i emailed [facility] asking for lab orders...td

## 2013-10-23 ENCOUNTER — Telehealth: Payer: Self-pay | Admitting: *Deleted

## 2013-10-23 ENCOUNTER — Telehealth: Payer: Self-pay | Admitting: Oncology

## 2013-10-23 NOTE — Telephone Encounter (Signed)
pt called to r/s lab to Friday....done

## 2013-10-23 NOTE — Telephone Encounter (Signed)
sw pt [facility]v appt for labs on 10/27/13 @ 8am. Per pt request...td

## 2013-10-24 ENCOUNTER — Other Ambulatory Visit (HOSPITAL_BASED_OUTPATIENT_CLINIC_OR_DEPARTMENT_OTHER): Payer: Medicaid Other

## 2013-10-24 ENCOUNTER — Encounter (INDEPENDENT_AMBULATORY_CARE_PROVIDER_SITE_OTHER): Payer: Self-pay

## 2013-10-24 DIAGNOSIS — D3A010 Benign carcinoid tumor of the duodenum: Secondary | ICD-10-CM

## 2013-10-24 DIAGNOSIS — D0590 Unspecified type of carcinoma in situ of unspecified breast: Secondary | ICD-10-CM

## 2013-10-24 DIAGNOSIS — C50519 Malignant neoplasm of lower-outer quadrant of unspecified female breast: Secondary | ICD-10-CM

## 2013-10-24 LAB — BASIC METABOLIC PANEL (CC13)
Anion Gap: 12 mEq/L — ABNORMAL HIGH (ref 3–11)
BUN: 12.6 mg/dL (ref 7.0–26.0)
CO2: 23 meq/L (ref 22–29)
CREATININE: 0.7 mg/dL (ref 0.6–1.1)
Calcium: 9.6 mg/dL (ref 8.4–10.4)
Chloride: 108 mEq/L (ref 98–109)
Glucose: 128 mg/dl (ref 70–140)
Potassium: 4 mEq/L (ref 3.5–5.1)
SODIUM: 143 meq/L (ref 136–145)

## 2013-10-26 ENCOUNTER — Encounter: Payer: Self-pay | Admitting: Oncology

## 2013-10-26 NOTE — Patient Instructions (Signed)
Smoking Cessation Quitting smoking is important to your health and has many advantages. However, it is not always easy to quit since nicotine is a very addictive drug. Often times, people try 3 times or more before being able to quit. This document explains the best ways for you to prepare to quit smoking. Quitting takes hard work and a lot of effort, but you can do it. ADVANTAGES OF QUITTING SMOKING  You will live longer, feel better, and live better.  Your body will feel the impact of quitting smoking almost immediately.  Within 20 minutes, blood pressure decreases. Your pulse returns to its normal level.  After 8 hours, carbon monoxide levels in the blood return to normal. Your oxygen level increases.  After 24 hours, the chance of having a heart attack starts to decrease. Your breath, hair, and body stop smelling like smoke.  After 48 hours, damaged nerve endings begin to recover. Your sense of taste and smell improve.  After 72 hours, the body is virtually free of nicotine. Your bronchial tubes relax and breathing becomes easier.  After 2 to 12 weeks, lungs can hold more air. Exercise becomes easier and circulation improves.  The risk of having a heart attack, stroke, cancer, or lung disease is greatly reduced.  After 1 year, the risk of coronary heart disease is cut in half.  After 5 years, the risk of stroke falls to the same as a nonsmoker.  After 10 years, the risk of lung cancer is cut in half and the risk of other cancers decreases significantly.  After 15 years, the risk of coronary heart disease drops, usually to the level of a nonsmoker.  If you are pregnant, quitting smoking will improve your chances of having a healthy baby.  The people you live with, especially any children, will be healthier.  You will have extra money to spend on things other than cigarettes. QUESTIONS TO THINK ABOUT BEFORE ATTEMPTING TO QUIT You may want to talk about your answers with your  caregiver.  Why do you want to quit?  If you tried to quit in the past, what helped and what did not?  What will be the most difficult situations for you after you quit? How will you plan to handle them?  Who can help you through the tough times? Your family? Friends? A caregiver?  What pleasures do you get from smoking? What ways can you still get pleasure if you quit? Here are some questions to ask your caregiver:  How can you help me to be successful at quitting?  What medicine do you think would be best for me and how should I take it?  What should I do if I need more help?  What is smoking withdrawal like? How can I get information on withdrawal? GET READY  Set a quit date.  Change your environment by getting rid of all cigarettes, ashtrays, matches, and lighters in your home, car, or work. Do not let people smoke in your home.  Review your past attempts to quit. Think about what worked and what did not. GET SUPPORT AND ENCOURAGEMENT You have a better chance of being successful if you have help. You can get support in many ways.  Tell your family, friends, and co-workers that you are going to quit and need their support. Ask them not to smoke around you.  Get individual, group, or telephone counseling and support. Programs are available at local hospitals and health centers. Call your local health department for   information about programs in your area.  Spiritual beliefs and practices may help some smokers quit.  Download a "quit meter" on your computer to keep track of quit statistics, such as how long you have gone without smoking, cigarettes not smoked, and money saved.  Get a self-help book about quitting smoking and staying off of tobacco. LEARN NEW SKILLS AND BEHAVIORS  Distract yourself from urges to smoke. Talk to someone, go for a walk, or occupy your time with a task.  Change your normal routine. Take a different route to work. Drink tea instead of coffee.  Eat breakfast in a different place.  Reduce your stress. Take a hot bath, exercise, or read a book.  Plan something enjoyable to do every day. Reward yourself for not smoking.  Explore interactive web-based programs that specialize in helping you quit. GET MEDICINE AND USE IT CORRECTLY Medicines can help you stop smoking and decrease the urge to smoke. Combining medicine with the above behavioral methods and support can greatly increase your chances of successfully quitting smoking.  Nicotine replacement therapy helps deliver nicotine to your body without the negative effects and risks of smoking. Nicotine replacement therapy includes nicotine gum, lozenges, inhalers, nasal sprays, and skin patches. Some may be available over-the-counter and others require a prescription.  Antidepressant medicine helps people abstain from smoking, but how this works is unknown. This medicine is available by prescription.  Nicotinic receptor partial agonist medicine simulates the effect of nicotine in your brain. This medicine is available by prescription. Ask your caregiver for advice about which medicines to use and how to use them based on your health history. Your caregiver will tell you what side effects to look out for if you choose to be on a medicine or therapy. Carefully read the information on the package. Do not use any other product containing nicotine while using a nicotine replacement product.  RELAPSE OR DIFFICULT SITUATIONS Most relapses occur within the first 3 months after quitting. Do not be discouraged if you start smoking again. Remember, most people try several times before finally quitting. You may have symptoms of withdrawal because your body is used to nicotine. You may crave cigarettes, be irritable, feel very hungry, cough often, get headaches, or have difficulty concentrating. The withdrawal symptoms are only temporary. They are strongest when you first quit, but they will go away within  10 14 days. To reduce the chances of relapse, try to:  Avoid drinking alcohol. Drinking lowers your chances of successfully quitting.  Reduce the amount of caffeine you consume. Once you quit smoking, the amount of caffeine in your body increases and can give you symptoms, such as a rapid heartbeat, sweating, and anxiety.  Avoid smokers because they can make you want to smoke.  Do not let weight gain distract you. Many smokers will gain weight when they quit, usually less than 10 pounds. Eat a healthy diet and stay active. You can always lose the weight gained after you quit.  Find ways to improve your mood other than smoking. FOR MORE INFORMATION  www.smokefree.gov  Document Released: 08/22/2001 Document Revised: 02/27/2012 Document Reviewed: 12/07/2011 [employer] Patient Information 2014 [employer]  

## 2013-10-26 NOTE — Progress Notes (Signed)
TAVITA ORIANS UK:[id number] [birth year] 58 y.o. 10/26/2013 9:52 PM  CC  Chesley Noon, MD [place] [address] Dr. 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 female.    #1 Patient noted a left breast mass she underwent ultrasound-guided biopsy on 12/06/2011 that showed a fibroadenoma. Recently she had enlargement in the left breast that was associated with some discomfort. She went on to have an excision performed on 04/02/2013. The pathology revealed a fibroadenoma with extensive involvement with low-grade ductal carcinoma in situ. The tumor was 3.5 cm in dimension. The ductal carcinoma in situ extended to less than 0.1 cm from the inked margin. Patient had MRI of the breast performed post R. This demonstrated enlarging mass in the inferior left breast representing a seroma there was also a fibroadenoma within the lower inner quadrant of left breast adjacent to the seroma no malignancies in the right side no axillary adenopathy.  #2 Patient also has had a exploratory laparotomy after having small bowel obstruction. She had a resection performed that showed a 2 cm well-differentiated  Neuroendocrine tumor with 2 of 5 lymph nodes positive. T3 N1 tumor. Postoperatively she is doing well for that as well.  #3 status post radiation therapy to the left breast to be completed 09/05/2013.  #4 post radiation begin tamoxifen 20 mg daily starting January 2015  Current therapy: tamoxifen 20 mg daily starting January 2015  Interval  history: Patient is seen in followup per request of her PCP. Recently patient developed abdominal pain. She had a CT scan performed that showed the possible mass in the liver. She now comes for further work up. Her pain has resolved but she is very anxious regarding the liver the liver lesion. It has been recommended that she have an MRI performed, she denies any other complaints.  Past Medical History: Past Medical History  Diagnosis Date  . Hepatitis C   . Jaundice   . Breast disorder     lump in left breast  . TIA (transient ischemic attack) OCT 2012  . Hyperlipidemia   . Varicose veins   . Adjustment disorder with mixed anxiety and depressed mood 01/05/2013  . Allergy   . Anxiety   . Arthritis     knees,wrists   . Pain management     goes to [facility] clinic, but she doesn't intend to return   . H/O echocardiogram     not finding report in EPIC, pt. reports that it was before her appt. /w Dr. Johnsie Cancel   . Stroke 2012    TIA  . Brain aneurysm 2012    2cms.  . Complication of anesthesia     hallucinations , anxiety   . Malignant neoplasm of lower-outer quadrant of female breast 05/19/2013  . Hx of radiation therapy 07/17/13-09/03/13    left breast,60.4Gy, 32/33 fx completed    Past Surgical History: Past Surgical History  Procedure Laterality Date  . Laparoscopy N/A 04/02/2013    Procedure: Diaganostic Lap.; Small Bowel Resection;  Surgeon: Harl Bowie, MD;  Location: [place];  Service: General;  Laterality: N/A;  . Breast lumpectomy Left 04/02/2013    Procedure: Excision  of Left Breast Mass;  Surgeon: Harl Bowie, MD;  Location: [place];  Service: General;  Laterality: Left;  . Colon surgery  03/2013    bowel obstruction , malignant tumor    . Vaginal births   36     birthed twins at -108 weeks gestation - both survived , total of 3 pregnancies - all born vaginally  . Breast lumpectomy with sentinel lymph node biopsy Left 06/05/2013    Procedure: BREAST LUMPECTOMY WITH  SENTINEL LYMPH NODE BX;  Surgeon: Harl Bowie, MD;  Location: [place];  Service: General;  Laterality: Left;  . Evacuation breast hematoma Left 06/05/2013    Procedure: EVACUATION HEMATOMA BREAST;  Surgeon: Harl Bowie, MD;  Location: [place];  Service: General;  Laterality: Left;    Family History: Family History  Problem Relation Age of Onset  . Diabetes Paternal Grandfather   . Diabetes Paternal Grandmother   . Cancer Father     lung  . Diabetes Mother   . Heart disease Mother   . Cancer Mother     cervical  . Diabetes Brother   . Stroke Brother   . Heart disease Sister   . Diabetes Sister   . Cancer Sister     cervical    Social History History  Substance Use Topics  . Smoking status: Current Every Day Smoker -- 0.50 packs/day for 35 years  . Smokeless tobacco: Never Used     Comment: cut down to  10 cigarettes daily 06/25/13  . Alcohol Use: Yes     Comment: seldom- rare    Allergies: Allergies  Allergen Reactions  . Statins Swelling    Stomach swelling & muscle ache  . Ace Inhibitors Other (See Comments)    Other reaction(s): Palpitations  . Ciprofloxacin Hcl Palpitations  . Tramadol Other (See Comments) and Anxiety    Extreme muscle pain Extreme agitation Extreme muscle pain Extreme agitation    Current Medications: Current Outpatient Prescriptions  Medication Sig Dispense Refill  . ALPRAZolam (XANAX) 1 MG tablet Take 1 mg by mouth 3 (three) times daily.      Marland Kitchen aspirin EC 81 MG tablet Take 81 mg by mouth 2 (two) times daily.      Marland Kitchen ibuprofen (ADVIL,MOTRIN) 200 MG tablet Take 200 mg by mouth every 6 (six) hours as needed.      . Multiple Vitamins-Minerals (THERA-M) TABS Take 1 tablet by mouth daily.      . nicotine (NICODERM CQ - DOSED IN MG/24 HOURS) 21 mg/24hr patch Place 1 patch onto the skin daily.      Marland Kitchen exemestane (AROMASIN) 25 MG tablet Take 1 tablet (25 mg total) by mouth daily after breakfast.  90 tablet  3   No current  facility-administered medications for this visit.    OB/GYN History:menarche at age 16 she underwent menopause in 2001 she said 3 live births first live birth was at 27  Fertility Discussion: n/a Prior History of Cancer: no  Health Maintenance:  Colonoscopy 2014 Bone Density no Last PAP smear 2012  ECOG PERFORMANCE STATUS: 1 - Symptomatic but completely ambulatory  Genetic Counseling/testing: no  REVIEW OF SYSTEMS:  Constitutional: positive for fatigue and night sweats Ears, nose, mouth, throat, and face: negative Respiratory: negative Cardiovascular: negative Gastrointestinal: positive for nausea and reflux symptoms Genitourinary:negative Integument/breast: positive for breast tenderness Hematologic/lymphatic: negative Musculoskeletal:positive for arthralgias Neurological: negative  PHYSICAL EXAMINATION: Blood pressure 135/85, pulse 66, temperature 97.7 F (36.5 C), temperature source Oral, resp. rate 20, height 5' 6.5" (1.689 m), weight 152 lb 12.8 oz (69.31 kg).  XVQ:MGQQP, healthy, no distress, well nourished, well developed and anxious SKIN: skin color, texture, turgor are normal HEAD: Normocephalic EYES: PERRLA, EOMI, Conjunctiva are pink and non-injected EARS: External ears normal OROPHARYNX:no exudate, no erythema and dentition normal  NECK: supple, no adenopathy, thyroid normal size, non-tender, without nodularity LYMPH:  no palpable lymphadenopathy, no hepatosplenomegaly, no cervical supraclavicular or axillary adenopathy BREAST:right breast normal without mass, skin or nipple changes or axillary nodes, surgical scars noted well-healed in the left breast LUNGS: clear to auscultation and percussion HEART: regular rate & rhythm ABDOMEN:abdomen soft, non-tender, normal bowel sounds, no masses or organomegaly and surgical scar well healing BACK: Back symmetric, no curvature., No CVA tenderness, Range of motion is normal EXTREMITIES:no edema, no clubbing, no  cyanosis  NEURO: alert & oriented x 3 with fluent speech, no focal motor/sensory deficits, gait normal, reflexes normal and symmetric     STUDIES/RESULTS: Ct Chest W Contrast  05/23/2013   CLINICAL DATA:  Followup evaluation for carcinoid tumor of the duodenum.  EXAM: CT CHEST, ABDOMEN, AND PELVIS WITH CONTRAST  TECHNIQUE: Multidetector CT imaging of the chest, abdomen and pelvis was performed following the standard protocol during bolus administration of intravenous contrast.  CONTRAST:  142mL OMNIPAQUE IOHEXOL 300 MG/ML  SOLN  COMPARISON:  CT of the abdomen and pelvis 03/31/2013.  FINDINGS: CT CHEST FINDINGS  Mediastinum: Heart size is normal. There is no significant pericardial fluid, thickening or pericardial calcification. No pathologically enlarged mediastinal or hilar lymph nodes. Esophagus is unremarkable in appearance.  Lungs/Pleura: No suspicious appearing pulmonary nodules or masses are identified at this time. No acute consolidative airspace disease. No pleural effusions. Mild centrilobular and paraseptal emphysema, most apparent in the lung apices.  Musculoskeletal: There are no aggressive appearing lytic or blastic lesions noted in the visualized portions of the skeleton. Surgical clips in the left breast, presumably from prior lumpectomy. The previously noted large nodule in the left breast has been removed, however, inferior to the resection site, there are at least 2 areas of soft tissue nodularity which appear new compared the prior study, best demonstrated on image 38 of series 2, the largest of which measures approximately 1.2 cm. These are nonspecific.  CT ABDOMEN AND PELVIS FINDINGS  Abdomen/Pelvis: The appearance of the liver, gallbladder, pancreas, spleen, bilateral adrenal glands and bilateral kidneys is unremarkable. The duodenum is grossly normal in appearance on today's examination. No significant volume of ascites. No pneumoperitoneum. No pathologic distention of small bowel.  Normal appendix. No definite pathologic lymphadenopathy identified within the abdomen or pelvis. Atherosclerosis throughout the abdominal and pelvic vasculature, without definite aneurysm or dissection. Uterus and ovaries are unremarkable in appearance. Urinary bladder is normal in appearance.  Musculoskeletal: There are no aggressive appearing lytic or blastic lesions noted in the visualized portions of the skeleton.  IMPRESSION: CT CHEST IMPRESSION  1. No signs of metastatic disease to the lungs at this time. 2. Status post left breast lumpectomy. Adjacent to the lumpectomy site there are 2 areas of nodularity inferiorly which are nonspecific. These may represent areas of postoperative scarring, however, the possibility of residual or recurrent disease in this region is not excluded. Correlation with mammography and or breast ultrasound may be warranted if clinically indicated. 3. Mild centrilobular and paraseptal emphysema.  CT ABDOMEN AND PELVIS IMPRESSION  1. No definite evidence to  suggest local recurrence of disease or metastatic disease in the abdomen or pelvis on today's examination. 2. Extensive atherosclerosis.   Electronically Signed   By: Vinnie Langton M.D.   On: 05/23/2013 08:56   Ct Abdomen Pelvis W Contrast  05/23/2013   CLINICAL DATA:  Followup evaluation for carcinoid tumor of the duodenum.  EXAM: CT CHEST, ABDOMEN, AND PELVIS WITH CONTRAST  TECHNIQUE: Multidetector CT imaging of the chest, abdomen and pelvis was performed following the standard protocol during bolus administration of intravenous contrast.  CONTRAST:  131mL OMNIPAQUE IOHEXOL 300 MG/ML  SOLN  COMPARISON:  CT of the abdomen and pelvis 03/31/2013.  FINDINGS: CT CHEST FINDINGS  Mediastinum: Heart size is normal. There is no significant pericardial fluid, thickening or pericardial calcification. No pathologically enlarged mediastinal or hilar lymph nodes. Esophagus is unremarkable in appearance.  Lungs/Pleura: No suspicious  appearing pulmonary nodules or masses are identified at this time. No acute consolidative airspace disease. No pleural effusions. Mild centrilobular and paraseptal emphysema, most apparent in the lung apices.  Musculoskeletal: There are no aggressive appearing lytic or blastic lesions noted in the visualized portions of the skeleton. Surgical clips in the left breast, presumably from prior lumpectomy. The previously noted large nodule in the left breast has been removed, however, inferior to the resection site, there are at least 2 areas of soft tissue nodularity which appear new compared the prior study, best demonstrated on image 38 of series 2, the largest of which measures approximately 1.2 cm. These are nonspecific.  CT ABDOMEN AND PELVIS FINDINGS  Abdomen/Pelvis: The appearance of the liver, gallbladder, pancreas, spleen, bilateral adrenal glands and bilateral kidneys is unremarkable. The duodenum is grossly normal in appearance on today's examination. No significant volume of ascites. No pneumoperitoneum. No pathologic distention of small bowel. Normal appendix. No definite pathologic lymphadenopathy identified within the abdomen or pelvis. Atherosclerosis throughout the abdominal and pelvic vasculature, without definite aneurysm or dissection. Uterus and ovaries are unremarkable in appearance. Urinary bladder is normal in appearance.  Musculoskeletal: There are no aggressive appearing lytic or blastic lesions noted in the visualized portions of the skeleton.  IMPRESSION: CT CHEST IMPRESSION  1. No signs of metastatic disease to the lungs at this time. 2. Status post left breast lumpectomy. Adjacent to the lumpectomy site there are 2 areas of nodularity inferiorly which are nonspecific. These may represent areas of postoperative scarring, however, the possibility of residual or recurrent disease in this region is not excluded. Correlation with mammography and or breast ultrasound may be warranted if  clinically indicated. 3. Mild centrilobular and paraseptal emphysema.  CT ABDOMEN AND PELVIS IMPRESSION  1. No definite evidence to suggest local recurrence of disease or metastatic disease in the abdomen or pelvis on today's examination. 2. Extensive atherosclerosis.   Electronically Signed   By: Vinnie Langton M.D.   On: 05/23/2013 08:56     LABS:    Chemistry      Component Value Date/Time   NA 143 10/24/2013 0805   NA 141 06/03/2013 1549   K 4.0 10/24/2013 0805   K 3.7 06/03/2013 1549   CL 104 06/03/2013 1549   CO2 23 10/24/2013 0805   CO2 27 06/03/2013 1549   BUN 12.6 10/24/2013 0805   BUN 9 06/03/2013 1549   CREATININE 0.7 10/24/2013 0805   CREATININE 0.91 06/03/2013 1549      Component Value Date/Time   CALCIUM 9.6 10/24/2013 0805   CALCIUM 9.1 06/03/2013 1549   ALKPHOS 74 08/26/2013 0807  ALKPHOS 66 06/03/2013 1549   AST 16 08/26/2013 0807   AST 15 06/03/2013 1549   ALT 16 08/26/2013 0807   ALT 16 06/03/2013 1549   BILITOT 0.37 08/26/2013 0807   BILITOT 0.3 06/03/2013 1549      Lab Results  Component Value Date   WBC 5.3 08/26/2013   HGB 13.9 08/26/2013   HCT 42.3 08/26/2013   MCV 87.2 08/26/2013   PLT 183 08/26/2013   PATHOLOGY: ADDITIONAL INFORMATION: 1. PROGNOSTIC INDICATORS - ACIS Results: IMMUNOHISTOCHEMICAL AND MORPHOMETRIC ANALYSIS BY THE AUTOMATED CELLULAR IMAGING SYSTEM (ACIS) Estrogen Receptor: 100%, POSITIVE, STRONG STAINING INTENSITY Progesterone Receptor: 100%, POSITIVE, STRONG STAINING INTENSITY REFERENCE RANGE ESTROGEN RECEPTOR NEGATIVE <1% POSITIVE =>1% PROGESTERONE RECEPTOR NEGATIVE <1% POSITIVE =>1% All controls stained appropriately Enid Cutter MD Pathologist, Electronic Signature ( Signed 04/07/2013) FINAL DIAGNOSIS Diagnosis 1. Breast, excision, Left - FIBROADENOMA WITH EXTENSIVE INVOLVEMENT WITH LOW GRADE CARCINOMA IN SITU. - ESTIMATED TUMOR SIZE IS 3.5 CM IN GREATEST DIMENSION. - LOW GRADE DUCTAL CARCINOMA IN SITU LESS THAN 0.1 CM  FROM INKED MARGIN. - SEE ONCOLOGY TEMPLATE. 2. Small intestine, resection, Small - INVASIVE WELL DIFFERENTIATED NEUROENDOCRINE TUMOR (CARCINOID), SPANNING 2 CM IN GREATEST 1 of 3 Duplicate copy FINAL for ALEXUIS, PERSICO B [phone]) Diagnosis(continued) DIMENSION. - TUMOR INVADES THROUGH MUSCULARIS PROPRIA INTO SUBSEROSAL TISSUES. - MARGINS ARE NEGATIVE. - TWO OF FIVE LYMPH NODES ARE POSITIVE FOR METASTATIC WELL DIFFERENTIATED NEUROENDOCRINE TUMOR (2/5). - SEE ONCOLOGY TEMPLATE. Microscopic Comment 1. BREAST, IN SITU CARCINOMA Specimen, including laterality: Left partial breast. Procedure: Left breast excision. Grade of carcinoma: Low grade. Necrosis: No. Estimated tumor size: (based on gross measurement): 3.5 cm. Treatment effect: N/A. Distance to closest margin: Less than 0.1 cm from unoriented inked margin. Breast prognostic profile: Not previously performed, will be performed on current case. Lymph nodes: Examined: 0. TNM: pTis, pNX. Comments: A Cytokeratin 5/6 immunohistochemical stain is performed on a representative block. The areas of epithelial proliferation fail to stain for cytokeratin 5/6. The morphology coupled with the staining pattern is consistent with the above diagnosis. A quantitative estrogen receptor and progesterone receptor will be performed on a representative block and reported in an addendum. Dr. Lyndon Code has seen the first specimen in consultation with agreement that the findings represent a fibroadenoma with low grade ductal carcinoma in situ. (RAH:caf 04/04/13) 2. SMALL INTESTINE NEUROENDOCRINE TUMOR Histologic Type: Well differentiated neuroendocrine tumor (carcinoid). Histologic Grade: Well differentiated/low grade. Microscopic Tumor Extension: Tumor extends through muscularis propria into subserosal tissues. Resection Margins: All margins are negative including proximal and distal margins. Lymph-Vascular invasion: Definitive lymph-vascular invasion is  not identified; however, lymph nodes are positive, see below. Perineural Invasion: Not identified. Lymph nodes: 2 of 5 lymph nodes positive for metastatic tumor. TNM code: pT3, pN1, MX. Additional Pathologic Findings: No additional significant pathologic abnormalities. Ancillary Studies: Can be performed upon request. Clinical History: Small bowel obstruction with small bowel mass. Comments: Immunohistochemical stains are performed. The tumor is positive for cytokeratin AE1/AE3, chromogranin, synaptophysin, and CD56. The morphology coupled with the staining pattern is consistent with the above diagnosis. Dr. Lyndon Code is in agreement that the tumor extends through muscularis propria (pT3). (RH:ecj 04/03/2013)   Assessment and plan    58 year old female with  #1 new diagnosis of stage 0 ductal carcinoma in situ of the left breast measuring 3.5 cm ER/PR positive. Patient is status post lumpectomy. She does have a close margin. If she will require a reexcision given the close margin.  #2 patient and I discussed adjuvant treatment of  DCIS with antiestrogen therapy. We discussed role of tamoxifen in this setting. We discussed the side effects of this as well today.  #3 patient is continuing radiation therapy currently. She will complete this on 09/05/2013. After which patient will start tamoxifen 20 mg daily for a total of 5 years curative intent.  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 We will need to continue to follow this as well.  #5. Abdominal pain with liver lesion on CT: will have patient undergo an MRI to better evaluate the liver. I think it most likely is a hemangioma but MRI will give [place] more information. Patient is reassured.    Thank you so much for allowing me to participate in the care of [place]. I will continue to follow up the patient with you and assist in her  care.  All questions were answered. The patient knows to call the clinic with any problems, questions or concerns. We can certainly see the patient much sooner if necessary.  I spent 10 minutes counseling the patient face to face. The total time spent in the appointment was 15 minutes.  Marcy Panning, MD Medical/Oncology [facility] [phone] (beeper) [phone] (Office)

## 2013-10-27 ENCOUNTER — Other Ambulatory Visit: Payer: Medicaid Other

## 2013-10-30 ENCOUNTER — Other Ambulatory Visit: Payer: Self-pay | Admitting: *Deleted

## 2013-10-30 ENCOUNTER — Ambulatory Visit
Admission: RE | Admit: 2013-10-30 | Discharge: 2013-10-30 | Disposition: A | Discharge status: Home / Self Care | Payer: Medicaid Other | Source: Ambulatory Visit | Attending: Oncology | Admitting: Oncology

## 2013-10-30 ENCOUNTER — Telehealth: Payer: Self-pay | Admitting: Oncology

## 2013-10-30 DIAGNOSIS — D0510 Intraductal carcinoma in situ of unspecified breast: Secondary | ICD-10-CM

## 2013-10-30 MED ORDER — GADOXETATE DISODIUM 0.25 MMOL/ML IV SOLN
7.0000 mL | Freq: Once | INTRAVENOUS | Status: AC | PRN
  Administered 2013-10-30: 7 mL via INTRAVENOUS

## 2013-10-30 NOTE — Telephone Encounter (Signed)
lmonvm advising the pt of her f/u appt on 11/06/2013@10 :30am

## 2013-11-03 ENCOUNTER — Encounter: Payer: Self-pay | Admitting: *Deleted

## 2013-11-03 ENCOUNTER — Telehealth: Payer: Self-pay | Admitting: *Deleted

## 2013-11-03 NOTE — Telephone Encounter (Signed)
sw pt informed her that kk moved her appt for 11/06/13 to 11/18/13 @ 2:45pm. Pt is aware of her appts. However she upset because she really wants to know her results of her MRI. i connected her to the nurse....td

## 2013-11-06 ENCOUNTER — Ambulatory Visit: Payer: Medicaid Other | Admitting: Oncology

## 2013-11-18 ENCOUNTER — Encounter: Payer: Self-pay | Admitting: Oncology

## 2013-11-18 ENCOUNTER — Ambulatory Visit (HOSPITAL_BASED_OUTPATIENT_CLINIC_OR_DEPARTMENT_OTHER): Payer: Medicaid Other | Admitting: Oncology

## 2013-11-18 ENCOUNTER — Telehealth: Payer: Self-pay | Admitting: Oncology

## 2013-11-18 VITALS — BP 164/90 | HR 70 | Temp 97.8°F | Resp 18 | Ht 66.5 in | Wt 152.1 lb

## 2013-11-18 DIAGNOSIS — C7A010 Malignant carcinoid tumor of the duodenum: Secondary | ICD-10-CM | POA: Insufficient documentation

## 2013-11-18 DIAGNOSIS — R109 Unspecified abdominal pain: Secondary | ICD-10-CM

## 2013-11-18 DIAGNOSIS — D0510 Intraductal carcinoma in situ of unspecified breast: Secondary | ICD-10-CM

## 2013-11-18 DIAGNOSIS — C7A019 Malignant carcinoid tumor of the small intestine, unspecified portion: Secondary | ICD-10-CM

## 2013-11-18 DIAGNOSIS — K769 Liver disease, unspecified: Secondary | ICD-10-CM

## 2013-11-18 DIAGNOSIS — Z170 Estrogen receptor positive status [ER+]: Secondary | ICD-10-CM

## 2013-11-18 DIAGNOSIS — D0590 Unspecified type of carcinoma in situ of unspecified breast: Secondary | ICD-10-CM

## 2013-11-18 NOTE — Progress Notes (Signed)
RAPHAELLE PREVETTE [id number] 05/17/1956 58 y.o. 11/18/2013 3:05 PM  CC  Chesley Noon, MD [place] [address] Dr. 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 female.    #1 Patient noted a left breast mass she underwent ultrasound-guided biopsy on 12/06/2011 that showed a fibroadenoma. Recently she had enlargement in the left breast that was associated with some discomfort. She went on to have an excision performed on 04/02/2013. The pathology revealed a fibroadenoma with extensive involvement with low-grade ductal carcinoma in situ. The tumor was 3.5 cm in dimension. The ductal carcinoma in situ extended to less than 0.1 cm from the inked margin. Patient had MRI of the breast performed post R. This demonstrated enlarging mass in the inferior left breast representing a seroma there was also a fibroadenoma within the lower inner quadrant of left breast adjacent to the seroma no malignancies in the right side no axillary adenopathy.  #2 Patient also has had a exploratory laparotomy after having small bowel obstruction. She had a resection performed that showed a 2 cm well-differentiated  Neuroendocrine tumor with 2 of 5 lymph nodes positive. T3 N1 tumor. Postoperatively she is doing well for that as well.  #3 status post radiation therapy to the left breast to be completed 09/05/2013.  #4 post radiation begin tamoxifen 20 mg daily starting January 2015 however patient states that she is not taking tamoxifen due to all of the side  effects in the literature. We discussed this again today and she declines.  #5 MRI liver done. 10/30/2013 with a 12 mm hypervascular lesion concerning for metastasis. Further workup with PET scan and possible biopsy   Current therapy:  workup for 12 mm liver lesion   Interval history: Patient is seen in followup. She had an MRI of the liver performed that showed a 12 mm lesion which was hypervascular possibly metastasis. Clinically patient is very anxious nervous. She continues to smoke unfortunately. She is also not taking her tamoxifen. She declines. She wants more workup performed for the liver lesion. I have recommended she have a PET scan performed and possible biopsy. She is agreeable to it. I have made arrangements for this. Remainder of the template review of systems is negative.   Past Medical History: Past Medical History  Diagnosis Date  . Hepatitis C   . Jaundice   . Breast disorder     lump in left breast  . TIA (transient ischemic attack) OCT 2012  . Hyperlipidemia   . Varicose veins   . Adjustment disorder with mixed anxiety and depressed mood 01/05/2013  . Allergy   . Anxiety   . Arthritis     knees,wrists   . Pain management     goes to [facility] clinic, but she doesn't intend to return   . H/O echocardiogram     not finding report in EPIC, pt. reports that it was before her appt. /w Dr. Johnsie Cancel   . Stroke 2012    TIA  . Brain aneurysm 2012    2cms.  . Complication of  anesthesia     hallucinations , anxiety   . Malignant neoplasm of lower-outer quadrant of female breast 05/19/2013  . Hx of radiation therapy 07/17/13-09/03/13    left breast,60.4Gy, 32/33 fx completed    Past Surgical History: Past Surgical History  Procedure Laterality Date  . Laparoscopy N/A 04/02/2013    Procedure: Diaganostic Lap.; Small Bowel Resection;  Surgeon: Harl Bowie, MD;  Location: [place];  Service: General;  Laterality: N/A;  . Breast lumpectomy Left 04/02/2013    Procedure:  Excision  of Left Breast Mass;  Surgeon: Harl Bowie, MD;  Location: [place];  Service: General;  Laterality: Left;  . Colon surgery  03/2013    bowel obstruction , malignant tumor    . Vaginal births   34     birthed twins at -[redacted] weeks gestation - both survived , total of 3 pregnancies - all born vaginally  . Breast lumpectomy with sentinel lymph node biopsy Left 06/05/2013    Procedure: BREAST LUMPECTOMY WITH SENTINEL LYMPH NODE BX;  Surgeon: Harl Bowie, MD;  Location: [place];  Service: General;  Laterality: Left;  . Evacuation breast hematoma Left 06/05/2013    Procedure: EVACUATION HEMATOMA BREAST;  Surgeon: Harl Bowie, MD;  Location: [place];  Service: General;  Laterality: Left;    Family History: Family History  Problem Relation Age of Onset  . Diabetes Paternal Grandfather   . Diabetes Paternal Grandmother   . Cancer Father     lung  . Diabetes Mother   . Heart disease Mother   . Cancer Mother     cervical  . Diabetes Brother   . Stroke Brother   . Heart disease Sister   . Diabetes Sister   . Cancer Sister     cervical    Social History History  Substance Use Topics  . Smoking status: Current Every Day Smoker -- 0.50 packs/day for 35 years  . Smokeless tobacco: Never Used     Comment: cut down to  10 cigarettes daily 06/25/13  . Alcohol Use: Yes     Comment: seldom- rare    Allergies: Allergies  Allergen Reactions  . Statins Swelling    Stomach swelling & muscle ache  . Ace Inhibitors Other (See Comments)    Other reaction(s): Palpitations  . Ciprofloxacin Hcl Palpitations  . Tramadol Other (See Comments) and Anxiety    Extreme muscle pain Extreme agitation Extreme muscle pain Extreme agitation    Current Medications: Current Outpatient Prescriptions  Medication Sig Dispense Refill  . ALPRAZolam (XANAX) 1 MG tablet Take 1 mg by mouth 3 (three) times daily.      Marland Kitchen aspirin EC 81 MG tablet Take 81 mg by mouth 2 (two) times daily.       Marland Kitchen ibuprofen (ADVIL,MOTRIN) 200 MG tablet Take 200 mg by mouth every 6 (six) hours as needed.      . Multiple Vitamins-Minerals (THERA-M) TABS Take 1 tablet by mouth daily.      . nicotine (NICODERM CQ - DOSED IN MG/24 HOURS) 21 mg/24hr patch Place 1 patch onto the skin daily.       No current facility-administered medications for this visit.    OB/GYN History:menarche at age 32 she underwent menopause in 2001 she said 3 live births first live birth was at 89  Fertility Discussion: n/a Prior History of Cancer: no  Health Maintenance:  Colonoscopy 2014 Bone Density no Last PAP smear 2012  ECOG PERFORMANCE STATUS: 1 - Symptomatic  but completely ambulatory  Genetic Counseling/testing: no  REVIEW OF SYSTEMS:  Constitutional: positive for fatigue and night sweats Ears, nose, mouth, throat, and face: negative Respiratory: negative Cardiovascular: negative Gastrointestinal: positive for nausea and reflux symptoms Genitourinary:negative Integument/breast: positive for breast tenderness Hematologic/lymphatic: negative Musculoskeletal:positive for arthralgias Neurological: negative  PHYSICAL EXAMINATION: Blood pressure 164/90, pulse 70, temperature 97.8 F (36.6 C), temperature source Oral, resp. rate 18, height 5' 6.5" (1.689 m), weight 152 lb 1.6 oz (68.992 kg).  [id number], healthy, no distress, well nourished, well developed and anxious SKIN: skin color, texture, turgor are normal HEAD: Normocephalic EYES: PERRLA, EOMI, Conjunctiva are pink and non-injected EARS: External ears normal OROPHARYNX:no exudate, no erythema and dentition normal  NECK: supple, no adenopathy, thyroid normal size, non-tender, without nodularity LYMPH:  no palpable lymphadenopathy, no hepatosplenomegaly, no cervical supraclavicular or axillary adenopathy BREAST:right breast normal without mass, skin or nipple changes or axillary nodes, surgical scars noted well-healed in the left breast LUNGS: clear  to auscultation and percussion HEART: regular rate & rhythm ABDOMEN:abdomen soft, non-tender, normal bowel sounds, no masses or organomegaly and surgical scar well healing BACK: Back symmetric, no curvature., No CVA tenderness, Range of motion is normal EXTREMITIES:no edema, no clubbing, no cyanosis  NEURO: alert & oriented x 3 with fluent speech, no focal motor/sensory deficits, gait normal, reflexes normal and symmetric     STUDIES/RESULTS: Ct Chest W Contrast  05/23/2013   CLINICAL DATA:  Followup evaluation for carcinoid tumor of the duodenum.  EXAM: CT CHEST, ABDOMEN, AND PELVIS WITH CONTRAST  TECHNIQUE: Multidetector CT imaging of the chest, abdomen and pelvis was performed following the standard protocol during bolus administration of intravenous contrast.  CONTRAST:  155mL OMNIPAQUE IOHEXOL 300 MG/ML  SOLN  COMPARISON:  CT of the abdomen and pelvis 03/31/2013.  FINDINGS: CT CHEST FINDINGS  Mediastinum: Heart size is normal. There is no significant pericardial fluid, thickening or pericardial calcification. No pathologically enlarged mediastinal or hilar lymph nodes. Esophagus is unremarkable in appearance.  Lungs/Pleura: No suspicious appearing pulmonary nodules or masses are identified at this time. No acute consolidative airspace disease. No pleural effusions. Mild centrilobular and paraseptal emphysema, most apparent in the lung apices.  Musculoskeletal: There are no aggressive appearing lytic or blastic lesions noted in the visualized portions of the skeleton. Surgical clips in the left breast, presumably from prior lumpectomy. The previously noted large nodule in the left breast has been removed, however, inferior to the resection site, there are at least 2 areas of soft tissue nodularity which appear new compared the prior study, best demonstrated on image 38 of series 2, the largest of which measures approximately 1.2 cm. These are nonspecific.  CT ABDOMEN AND PELVIS FINDINGS   Abdomen/Pelvis: The appearance of the liver, gallbladder, pancreas, spleen, bilateral adrenal glands and bilateral kidneys is unremarkable. The duodenum is grossly normal in appearance on today's examination. No significant volume of ascites. No pneumoperitoneum. No pathologic distention of small bowel. Normal appendix. No definite pathologic lymphadenopathy identified within the abdomen or pelvis. Atherosclerosis throughout the abdominal and pelvic vasculature, without definite aneurysm or dissection. Uterus and ovaries are unremarkable in appearance. Urinary bladder is normal in appearance.  Musculoskeletal: There are no aggressive appearing lytic or blastic lesions noted in the visualized portions of the skeleton.  IMPRESSION: CT CHEST IMPRESSION  1. No signs of metastatic disease to the lungs at this time. 2. Status post left breast lumpectomy. Adjacent to the lumpectomy site there are 2 areas of nodularity inferiorly which are nonspecific. These  may represent areas of postoperative scarring, however, the possibility of residual or recurrent disease in this region is not excluded. Correlation with mammography and or breast ultrasound may be warranted if clinically indicated. 3. Mild centrilobular and paraseptal emphysema.  CT ABDOMEN AND PELVIS IMPRESSION  1. No definite evidence to suggest local recurrence of disease or metastatic disease in the abdomen or pelvis on today's examination. 2. Extensive atherosclerosis.   Electronically Signed   By: Vinnie Langton M.D.   On: 05/23/2013 08:56   Ct Abdomen Pelvis W Contrast  05/23/2013   CLINICAL DATA:  Followup evaluation for carcinoid tumor of the duodenum.  EXAM: CT CHEST, ABDOMEN, AND PELVIS WITH CONTRAST  TECHNIQUE: Multidetector CT imaging of the chest, abdomen and pelvis was performed following the standard protocol during bolus administration of intravenous contrast.  CONTRAST:  186mL OMNIPAQUE IOHEXOL 300 MG/ML  SOLN  COMPARISON:  CT of the abdomen and  pelvis 03/31/2013.  FINDINGS: CT CHEST FINDINGS  Mediastinum: Heart size is normal. There is no significant pericardial fluid, thickening or pericardial calcification. No pathologically enlarged mediastinal or hilar lymph nodes. Esophagus is unremarkable in appearance.  Lungs/Pleura: No suspicious appearing pulmonary nodules or masses are identified at this time. No acute consolidative airspace disease. No pleural effusions. Mild centrilobular and paraseptal emphysema, most apparent in the lung apices.  Musculoskeletal: There are no aggressive appearing lytic or blastic lesions noted in the visualized portions of the skeleton. Surgical clips in the left breast, presumably from prior lumpectomy. The previously noted large nodule in the left breast has been removed, however, inferior to the resection site, there are at least 2 areas of soft tissue nodularity which appear new compared the prior study, best demonstrated on image 38 of series 2, the largest of which measures approximately 1.2 cm. These are nonspecific.  CT ABDOMEN AND PELVIS FINDINGS  Abdomen/Pelvis: The appearance of the liver, gallbladder, pancreas, spleen, bilateral adrenal glands and bilateral kidneys is unremarkable. The duodenum is grossly normal in appearance on today's examination. No significant volume of ascites. No pneumoperitoneum. No pathologic distention of small bowel. Normal appendix. No definite pathologic lymphadenopathy identified within the abdomen or pelvis. Atherosclerosis throughout the abdominal and pelvic vasculature, without definite aneurysm or dissection. Uterus and ovaries are unremarkable in appearance. Urinary bladder is normal in appearance.  Musculoskeletal: There are no aggressive appearing lytic or blastic lesions noted in the visualized portions of the skeleton.  IMPRESSION: CT CHEST IMPRESSION  1. No signs of metastatic disease to the lungs at this time. 2. Status post left breast lumpectomy. Adjacent to the  lumpectomy site there are 2 areas of nodularity inferiorly which are nonspecific. These may represent areas of postoperative scarring, however, the possibility of residual or recurrent disease in this region is not excluded. Correlation with mammography and or breast ultrasound may be warranted if clinically indicated. 3. Mild centrilobular and paraseptal emphysema.  CT ABDOMEN AND PELVIS IMPRESSION  1. No definite evidence to suggest local recurrence of disease or metastatic disease in the abdomen or pelvis on today's examination. 2. Extensive atherosclerosis.   Electronically Signed   By: Vinnie Langton M.D.   On: 05/23/2013 08:56     LABS:    Chemistry      Component Value Date/Time   NA 143 10/24/2013 0805   NA 141 06/03/2013 1549   K 4.0 10/24/2013 0805   K 3.7 06/03/2013 1549   CL 104 06/03/2013 1549   CO2 23 10/24/2013 0805   CO2 27 06/03/2013 1549  BUN 12.6 10/24/2013 0805   BUN 9 06/03/2013 1549   CREATININE 0.7 10/24/2013 0805   CREATININE 0.91 06/03/2013 1549      Component Value Date/Time   CALCIUM 9.6 10/24/2013 0805   CALCIUM 9.1 06/03/2013 1549   ALKPHOS 74 08/26/2013 0807   ALKPHOS 66 06/03/2013 1549   AST 16 08/26/2013 0807   AST 15 06/03/2013 1549   ALT 16 08/26/2013 0807   ALT 16 06/03/2013 1549   BILITOT 0.37 08/26/2013 0807   BILITOT 0.3 06/03/2013 1549      Lab Results  Component Value Date   WBC 5.3 08/26/2013   HGB 13.9 08/26/2013   HCT 42.3 08/26/2013   MCV 87.2 08/26/2013   PLT 183 08/26/2013   PATHOLOGY: ADDITIONAL INFORMATION: 1. PROGNOSTIC INDICATORS - ACIS Results: IMMUNOHISTOCHEMICAL AND MORPHOMETRIC ANALYSIS BY THE AUTOMATED CELLULAR IMAGING SYSTEM (ACIS) Estrogen Receptor: 100%, POSITIVE, STRONG STAINING INTENSITY Progesterone Receptor: 100%, POSITIVE, STRONG STAINING INTENSITY REFERENCE RANGE ESTROGEN RECEPTOR NEGATIVE <1% POSITIVE =>1% PROGESTERONE RECEPTOR NEGATIVE <1% POSITIVE =>1% All controls stained appropriately Enid Cutter  MD Pathologist, Electronic Signature ( Signed 04/07/2013) FINAL DIAGNOSIS Diagnosis 1. Breast, excision, Left - FIBROADENOMA WITH EXTENSIVE INVOLVEMENT WITH LOW GRADE CARCINOMA IN SITU. - ESTIMATED TUMOR SIZE IS 3.5 CM IN GREATEST DIMENSION. - LOW GRADE DUCTAL CARCINOMA IN SITU LESS THAN 0.1 CM FROM INKED MARGIN. - SEE ONCOLOGY TEMPLATE. 2. Small intestine, resection, Small - INVASIVE WELL DIFFERENTIATED NEUROENDOCRINE TUMOR (CARCINOID), SPANNING 2 CM IN GREATEST 1 of 3 Duplicate copy FINAL for TONIQUA, COZBY B [phone]) Diagnosis(continued) DIMENSION. - TUMOR INVADES THROUGH MUSCULARIS PROPRIA INTO SUBSEROSAL TISSUES. - MARGINS ARE NEGATIVE. - TWO OF FIVE LYMPH NODES ARE POSITIVE FOR METASTATIC WELL DIFFERENTIATED NEUROENDOCRINE TUMOR (2/5). - SEE ONCOLOGY TEMPLATE. Microscopic Comment 1. BREAST, IN SITU CARCINOMA Specimen, including laterality: Left partial breast. Procedure: Left breast excision. Grade of carcinoma: Low grade. Necrosis: No. Estimated tumor size: (based on gross measurement): 3.5 cm. Treatment effect: N/A. Distance to closest margin: Less than 0.1 cm from unoriented inked margin. Breast prognostic profile: Not previously performed, will be performed on current case. Lymph nodes: Examined: 0. TNM: pTis, pNX. Comments: A Cytokeratin 5/6 immunohistochemical stain is performed on a representative block. The areas of epithelial proliferation fail to stain for cytokeratin 5/6. The morphology coupled with the staining pattern is consistent with the above diagnosis. A quantitative estrogen receptor and progesterone receptor will be performed on a representative block and reported in an addendum. Dr. Lyndon Code has seen the first specimen in consultation with agreement that the findings represent a fibroadenoma with low grade ductal carcinoma in situ. (RAH:caf 04/04/13) 2. SMALL INTESTINE NEUROENDOCRINE TUMOR Histologic Type: Well differentiated neuroendocrine tumor  (carcinoid). Histologic Grade: Well differentiated/low grade. Microscopic Tumor Extension: Tumor extends through muscularis propria into subserosal tissues. Resection Margins: All margins are negative including proximal and distal margins. Lymph-Vascular invasion: Definitive lymph-vascular invasion is not identified; however, lymph nodes are positive, see below. Perineural Invasion: Not identified. Lymph nodes: 2 of 5 lymph nodes positive for metastatic tumor. TNM code: pT3, pN1, MX. Additional Pathologic Findings: No additional significant pathologic abnormalities. Ancillary Studies: Can be performed upon request. Clinical History: Small bowel obstruction with small bowel mass. Comments: Immunohistochemical stains are performed. The tumor is positive for cytokeratin AE1/AE3, chromogranin, synaptophysin, and CD56. The morphology coupled with the staining pattern is consistent with the above diagnosis. Dr. Lyndon Code is in agreement that the tumor extends through muscularis propria (pT3). (RH:ecj 04/03/2013)   Assessment and plan    58 year old female with  #  1 new diagnosis of stage 0 ductal carcinoma in situ of the left breast measuring 3.5 cm ER/PR positive. Patient is status post lumpectomy. She does have a close margin. If she will require a reexcision given the close margin.  #2 patient and I discussed adjuvant treatment of DCIS with antiestrogen therapy. We discussed role of tamoxifen in this setting. We discussed the side effects of this as well today.  #3 patient is continuing radiation therapy currently. She will complete this on 09/05/2013. After which patient will start tamoxifen 20 mg daily for a total of 5 years curative intent.  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 We will need to continue to follow this as well.  #5. Abdominal pain with liver lesion on CT: MRI reveals  well millimeter hypervascular lesion in the liver. Could be possibly metastatic carcinoid. We will need a PET scan for further evaluation. Possibly we will need biopsies. I have explained this to the patient in detail. She is agreeable to proceeding with a PET scan as well as possible biopsy if needed.  #6. Followup: I will see the patient back on December 30 2013   Thank you so much for allowing me to participate in the care of [place]. I will continue to follow up the patient with you and assist in her care.  All questions were answered. The patient knows to call the clinic with any problems, questions or concerns. We can certainly see the patient much sooner if necessary.  I spent 10 minutes counseling the patient face to face. The total time spent in the appointment was 15 minutes.  Marcy Panning, MD Medical/Oncology [facility] [phone] (beeper) [phone] (Office)

## 2013-11-18 NOTE — Telephone Encounter (Signed)
, °

## 2013-11-26 ENCOUNTER — Ambulatory Visit (HOSPITAL_COMMUNITY)
Admission: RE | Admit: 2013-11-26 | Discharge: 2013-11-26 | Disposition: A | Discharge status: Home / Self Care | Payer: Medicaid Other | Source: Ambulatory Visit | Attending: Oncology | Admitting: Oncology

## 2013-11-26 ENCOUNTER — Encounter (HOSPITAL_COMMUNITY): Payer: Self-pay

## 2013-11-26 DIAGNOSIS — Z980 Intestinal bypass and anastomosis status: Secondary | ICD-10-CM | POA: Insufficient documentation

## 2013-11-26 DIAGNOSIS — K7689 Other specified diseases of liver: Secondary | ICD-10-CM | POA: Insufficient documentation

## 2013-11-26 DIAGNOSIS — C7B8 Other secondary neuroendocrine tumors: Secondary | ICD-10-CM | POA: Insufficient documentation

## 2013-11-26 DIAGNOSIS — D0510 Intraductal carcinoma in situ of unspecified breast: Secondary | ICD-10-CM

## 2013-11-26 DIAGNOSIS — D0590 Unspecified type of carcinoma in situ of unspecified breast: Secondary | ICD-10-CM | POA: Insufficient documentation

## 2013-11-26 DIAGNOSIS — C7A019 Malignant carcinoid tumor of the small intestine, unspecified portion: Secondary | ICD-10-CM | POA: Insufficient documentation

## 2013-11-26 LAB — GLUCOSE, CAPILLARY: Glucose-Capillary: 105 mg/dL — ABNORMAL HIGH (ref 70–99)

## 2013-11-26 MED ORDER — FLUDEOXYGLUCOSE F - 18 (FDG) INJECTION
7.9000 | Freq: Once | INTRAVENOUS | Status: AC | PRN
  Administered 2013-11-26: 7.9 via INTRAVENOUS

## 2013-11-27 ENCOUNTER — Telehealth: Payer: Self-pay

## 2013-11-27 NOTE — Telephone Encounter (Signed)
Per KK note, advised patient of results and to keep next scheduled appt.  Pt voiced understanding.

## 2013-11-27 NOTE — Telephone Encounter (Signed)
Message copied by Prentiss Bells on Thu Nov 27, 2013 12:06 PM ------      Message from: Deatra Robinson      Created: Thu Nov 27, 2013  9:31 AM       Liver lesion does not light up on PET scan, this is good            Keep appointment as scheduled ------

## 2013-11-28 ENCOUNTER — Telehealth: Payer: Self-pay | Admitting: *Deleted

## 2013-11-28 NOTE — Telephone Encounter (Signed)
Message copied by Hebert Soho on Fri Nov 28, 2013  1:14 PM ------      Message from: Deatra Robinson      Created: Thu Nov 27, 2013  9:31 AM       Liver lesion does not light up on PET scan, this is good            Keep appointment as scheduled ------

## 2013-11-28 NOTE — Telephone Encounter (Signed)
As noted below by Dr. Humphrey Rolls, the PET scan looks good. Patient verbalized understanding about results and to keep scheduled appointments.

## 2013-12-29 ENCOUNTER — Telehealth: Payer: Self-pay | Admitting: Oncology

## 2013-12-29 NOTE — Telephone Encounter (Signed)
S/W PT ADVISED APPT 4/21 CANCELLED DUE TO MD LOA AND APPT 4/27 MOVED TO DR. Earnest Conroy AT 11AM. PT VERBALIZED UNDERSTANDING. TIA

## 2013-12-30 ENCOUNTER — Ambulatory Visit: Payer: Medicaid Other | Admitting: Oncology

## 2014-01-02 ENCOUNTER — Other Ambulatory Visit: Payer: Self-pay | Admitting: *Deleted

## 2014-01-02 DIAGNOSIS — C7A010 Malignant carcinoid tumor of the duodenum: Secondary | ICD-10-CM

## 2014-01-05 ENCOUNTER — Other Ambulatory Visit (HOSPITAL_BASED_OUTPATIENT_CLINIC_OR_DEPARTMENT_OTHER): Payer: Medicaid Other

## 2014-01-05 ENCOUNTER — Ambulatory Visit: Payer: Medicaid Other | Admitting: Oncology

## 2014-01-05 ENCOUNTER — Telehealth: Payer: Self-pay | Admitting: Hematology and Oncology

## 2014-01-05 ENCOUNTER — Ambulatory Visit (HOSPITAL_BASED_OUTPATIENT_CLINIC_OR_DEPARTMENT_OTHER): Payer: Medicaid Other | Admitting: Hematology and Oncology

## 2014-01-05 ENCOUNTER — Other Ambulatory Visit: Payer: Medicaid Other

## 2014-01-05 ENCOUNTER — Ambulatory Visit: Payer: Medicaid Other

## 2014-01-05 VITALS — BP 131/78 | HR 59 | Temp 98.7°F | Resp 18 | Ht 66.5 in | Wt 154.8 lb

## 2014-01-05 DIAGNOSIS — D0510 Intraductal carcinoma in situ of unspecified breast: Secondary | ICD-10-CM

## 2014-01-05 DIAGNOSIS — C50919 Malignant neoplasm of unspecified site of unspecified female breast: Secondary | ICD-10-CM

## 2014-01-05 DIAGNOSIS — D0590 Unspecified type of carcinoma in situ of unspecified breast: Secondary | ICD-10-CM

## 2014-01-05 DIAGNOSIS — C787 Secondary malignant neoplasm of liver and intrahepatic bile duct: Secondary | ICD-10-CM | POA: Diagnosis not present

## 2014-01-05 DIAGNOSIS — C7A010 Malignant carcinoid tumor of the duodenum: Secondary | ICD-10-CM

## 2014-01-05 DIAGNOSIS — K7689 Other specified diseases of liver: Secondary | ICD-10-CM

## 2014-01-05 LAB — COMPREHENSIVE METABOLIC PANEL (CC13)
ALT: 12 U/L (ref 0–55)
ANION GAP: 9 meq/L (ref 3–11)
AST: 14 U/L (ref 5–34)
Albumin: 3.8 g/dL (ref 3.5–5.0)
Alkaline Phosphatase: 77 U/L (ref 40–150)
BILIRUBIN TOTAL: 0.32 mg/dL (ref 0.20–1.20)
BUN: 15.2 mg/dL (ref 7.0–26.0)
CALCIUM: 9.7 mg/dL (ref 8.4–10.4)
CHLORIDE: 110 meq/L — AB (ref 98–109)
CO2: 25 meq/L (ref 22–29)
CREATININE: 0.8 mg/dL (ref 0.6–1.1)
GLUCOSE: 71 mg/dL (ref 70–140)
Potassium: 4.5 mEq/L (ref 3.5–5.1)
Sodium: 145 mEq/L (ref 136–145)
Total Protein: 7 g/dL (ref 6.4–8.3)

## 2014-01-05 LAB — CBC WITH DIFFERENTIAL/PLATELET
BASO%: 0.2 % (ref 0.0–2.0)
Basophils Absolute: 0 10*3/uL (ref 0.0–0.1)
EOS ABS: 0.1 10*3/uL (ref 0.0–0.5)
EOS%: 2 % (ref 0.0–7.0)
HCT: 41.7 % (ref 34.8–46.6)
HEMOGLOBIN: 13.6 g/dL (ref 11.6–15.9)
LYMPH%: 18.6 % (ref 14.0–49.7)
MCH: 28.8 pg (ref 25.1–34.0)
MCHC: 32.6 g/dL (ref 31.5–36.0)
MCV: 88.3 fL (ref 79.5–101.0)
MONO#: 0.5 10*3/uL (ref 0.1–0.9)
MONO%: 8.4 % (ref 0.0–14.0)
NEUT#: 3.9 10*3/uL (ref 1.5–6.5)
NEUT%: 70.8 % (ref 38.4–76.8)
PLATELETS: 215 10*3/uL (ref 145–400)
RBC: 4.72 10*6/uL (ref 3.70–5.45)
RDW: 13.3 % (ref 11.2–14.5)
WBC: 5.5 10*3/uL (ref 3.9–10.3)
lymph#: 1 10*3/uL (ref 0.9–3.3)

## 2014-01-05 NOTE — Telephone Encounter (Signed)
per pof to sch appt/adv [facility] would call for the [facility]

## 2014-01-05 NOTE — Progress Notes (Signed)
[name] [facility]:[id number] [birth year] 58 y.o. 01/05/2014 12:27 PM  CC  Chesley Noon, MD [place] [address] Dr. 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 Laurelyn Sickle note  [name] is a 58 y.o. female.    #1 Patient noted a left breast mass she underwent ultrasound-guided biopsy on 12/06/2011 that showed a fibroadenoma. Recently she had enlargement in the left breast that was associated with some discomfort. She went on to have an excision performed on 04/02/2013. The pathology revealed a fibroadenoma with extensive involvement with low-grade ductal carcinoma in situ. The tumor was 3.5 cm in dimension. The ductal carcinoma in situ extended to less than 0.1 cm from the inked margin. Patient had MRI of the breast performed post R. This demonstrated enlarging mass in the inferior left breast representing a seroma there was also a fibroadenoma within the lower inner quadrant of left breast adjacent to the seroma no malignancies in the right side no axillary adenopathy.  #2 Patient also has had a exploratory laparotomy after having small bowel obstruction. She had a resection performed that showed a 2 cm well-differentiated  Neuroendocrine tumor with 2 of 5 lymph nodes positive. T3 N1 tumor. Postoperatively she is doing well for that as well.  #3 status post radiation therapy to the left breast to be completed 09/05/2013.  #4 post radiation begin tamoxifen 20 mg daily starting January 2015 however patient states that she is not  taking tamoxifen due to all of the side effects in the literature.   #5 MRI liver done. 10/30/2013 with a 12 mm hypervascular lesion concerning for metastasis. Further workup with PET scan and possible biopsy   Current therapy:  workup for 12 mm liver lesion   Interval history: [name] is here for followup on the PET scan report and further management. PET scan which was performed in March 2015 revealed small hypodensity in the right hepatic lobe measuring 10 mm with no associated metabolic activity. Mild activity is noted at the enteric anastomosis within the lower abdomen and it could be postsurgical inflammation versus residual tumor.  She continued to smoke about a pack of cigarettes per day. She says that she have been  feeling well except that she does have a little bit ache in the left breast other than that Denis any shortness of breath and palpitations. she complains of cough that she attributes to all smokers cough and allergies. She denies any blood in the stool or blood in the urine. She continue to decline on taking tamoxifen in view of side effects   Past Medical History: Past Medical History  Diagnosis Date  . Hepatitis C   . Jaundice   . Breast disorder     lump in left breast  . TIA (transient ischemic attack) OCT 2012  . Hyperlipidemia   . Varicose veins   . Adjustment disorder with mixed anxiety and depressed mood 01/05/2013  . Allergy   . Anxiety   . Arthritis     knees,wrists   . Pain management  goes to [place] [facility], but she doesn't intend to return   . H/O echocardiogram     not finding report in EPIC, pt. reports that it was before her appt. /w Dr. Johnsie Cancel   . Stroke 2012    TIA  . Brain aneurysm 2012    2cms.  . Complication of anesthesia     hallucinations , anxiety   . Malignant neoplasm of lower-outer quadrant of female breast 05/19/2013  . Hx of radiation therapy 07/17/13-09/03/13    left breast,60.4Gy, 32/33 fx completed    Past Surgical  History: Past Surgical History  Procedure Laterality Date  . Laparoscopy N/A 04/02/2013    Procedure: Diaganostic Lap.; Small Bowel Resection;  Surgeon: Harl Bowie, MD;  Location: [facility];  Service: General;  Laterality: N/A;  . Breast lumpectomy Left 04/02/2013    Procedure: Excision  of Left Breast Mass;  Surgeon: Harl Bowie, MD;  Location: [facility];  Service: General;  Laterality: Left;  . Colon surgery  03/2013    bowel obstruction , malignant tumor    . Vaginal births   42     birthed twins at -[redacted] weeks gestation - both survived , total of 3 pregnancies - all born vaginally  . Breast lumpectomy with sentinel lymph node biopsy Left 06/05/2013    Procedure: BREAST LUMPECTOMY WITH SENTINEL LYMPH NODE BX;  Surgeon: Harl Bowie, MD;  Location: [facility];  Service: General;  Laterality: Left;  . Evacuation breast hematoma Left 06/05/2013    Procedure: EVACUATION HEMATOMA BREAST;  Surgeon: Harl Bowie, MD;  Location: [facility];  Service: General;  Laterality: Left;    Family History: Family History  Problem Relation Age of Onset  . Diabetes Paternal Grandfather   . Diabetes Paternal Grandmother   . Cancer Father     lung  . Diabetes Mother   . Heart disease Mother   . Cancer Mother     cervical  . Diabetes Brother   . Stroke Brother   . Heart disease Sister   . Diabetes Sister   . Cancer Sister     cervical    Social History History  Substance Use Topics  . Smoking status: Current Every Day Smoker -- 0.50 packs/day for 35 years  . Smokeless tobacco: Never Used     Comment: cut down to  10 cigarettes daily 06/25/13  . Alcohol Use: Yes     Comment: seldom- rare    Allergies: Allergies  Allergen Reactions  . Statins Swelling    Stomach swelling & muscle ache  . Ace Inhibitors Other (See Comments)    Other reaction(s): Palpitations  . Ciprofloxacin Hcl Palpitations  . Tramadol Other (See Comments) and Anxiety    Extreme muscle pain Extreme  agitation Extreme muscle pain Extreme agitation    Current Medications: Current Outpatient Prescriptions  Medication Sig Dispense Refill  . ALPRAZolam (XANAX) 1 MG tablet Take 1 mg by mouth 3 (three) times daily.      Marland Kitchen aspirin EC 81 MG tablet Take 81 mg by mouth 2 (two) times daily.      Marland Kitchen ibuprofen (ADVIL,MOTRIN) 200 MG tablet Take 200 mg by mouth every 6 (six) hours as needed.      . Multiple Vitamins-Minerals (THERA-M) TABS Take 1 tablet by mouth daily.      . nicotine (NICODERM CQ - DOSED IN MG/24 HOURS) 21 mg/24hr patch Place 1 patch onto the skin daily.       No current facility-administered  medications for this visit.    OB/GYN History:menarche at age 75 she underwent menopause in 2001 she said 3 live births first live birth was at 65  Fertility Discussion: n/a Prior History of Cancer: no  Health Maintenance:  Colonoscopy 2014 Bone Density no Last PAP smear 2012  ECOG PERFORMANCE STATUS: 1 - Symptomatic but completely ambulatory  Genetic Counseling/testing: no  REVIEW OF SYSTEMS:  Constitutional: positive for fatigue and night sweats Ears, nose, mouth, throat, and face: negative Respiratory: negative Cardiovascular: negative Gastrointestinal: positive for nausea and reflux symptoms Genitourinary:negative Integument/breast: positive for breast tenderness Hematologic/lymphatic: negative Musculoskeletal:positive for arthralgias Neurological: negative  PHYSICAL EXAMINATION: Blood pressure 131/78, pulse 59, temperature 98.7 F (37.1 C), temperature source Oral, resp. rate 18, height 5' 6.5" (1.689 m), weight 154 lb 12.8 oz (70.217 kg).  Patient is alert, healthy, no distress, well nourished, well developed and anxious SKIN: skin color, texture, turgor are normal HEAD: Normocephalic EYES: PERRLA, EOMI, Conjunctiva are pink and non-injected EARS: External ears normal OROPHARYNX:no exudate, no erythema and dentition normal  NECK: supple, no adenopathy, thyroid  normal size, non-tender, without nodularity LYMPH:  no palpable lymphadenopathy, no hepatosplenomegaly, no cervical supraclavicular or axillary adenopathy BREAST:right breast normal without mass, skin or nipple changes or axillary nodes, surgical scars noted well-healed in the left breast LUNGS: clear to auscultation and percussion HEART: regular rate & rhythm ABDOMEN:abdomen soft, non-tender, normal bowel sounds, no masses or organomegaly and surgical scar well healing BACK: Back symmetric, no curvature., No CVA tenderness, Range of motion is normal EXTREMITIES:no edema, no clubbing, no cyanosis  NEURO: alert & oriented x 3 with fluent speech, no focal motor/sensory deficits, gait normal, reflexes normal and symmetric     STUDIES/RESULTS: Ct Chest W Contrast  05/23/2013   CLINICAL DATA:  Followup evaluation for carcinoid tumor of the duodenum.  EXAM: CT CHEST, ABDOMEN, AND PELVIS WITH CONTRAST  TECHNIQUE: Multidetector CT imaging of the chest, abdomen and pelvis was performed following the standard protocol during bolus administration of intravenous contrast.  CONTRAST:  172mL OMNIPAQUE IOHEXOL 300 MG/ML  SOLN  COMPARISON:  CT of the abdomen and pelvis 03/31/2013.  FINDINGS: CT CHEST FINDINGS  Mediastinum: Heart size is normal. There is no significant pericardial fluid, thickening or pericardial calcification. No pathologically enlarged mediastinal or hilar lymph nodes. Esophagus is unremarkable in appearance.  Lungs/Pleura: No suspicious appearing pulmonary nodules or masses are identified at this time. No acute consolidative airspace disease. No pleural effusions. Mild centrilobular and paraseptal emphysema, most apparent in the lung apices.  Musculoskeletal: There are no aggressive appearing lytic or blastic lesions noted in the visualized portions of the skeleton. Surgical clips in the left breast, presumably from prior lumpectomy. The previously noted large nodule in the left breast has been  removed, however, inferior to the resection site, there are at least 2 areas of soft tissue nodularity which appear new compared the prior study, best demonstrated on image 38 of series 2, the largest of which measures approximately 1.2 cm. These are nonspecific.  CT ABDOMEN AND PELVIS FINDINGS  Abdomen/Pelvis: The appearance of the liver, gallbladder, pancreas, spleen, bilateral adrenal glands and bilateral kidneys is unremarkable. The duodenum is grossly normal in appearance on today's examination. No significant volume of ascites. No pneumoperitoneum. No pathologic distention of small bowel. Normal appendix. No definite pathologic lymphadenopathy identified within the abdomen or pelvis. Atherosclerosis throughout the abdominal and pelvic vasculature, without definite aneurysm or dissection. Uterus and ovaries are unremarkable in appearance. Urinary bladder is normal in appearance.  Musculoskeletal: There are no aggressive appearing lytic or blastic lesions noted in the visualized portions of the skeleton.  IMPRESSION: CT CHEST IMPRESSION  1. No signs of metastatic disease to the lungs at this time. 2. Status post left breast lumpectomy. Adjacent to the lumpectomy site there are 2 areas of nodularity inferiorly which are nonspecific. These may represent areas of postoperative scarring, however, the possibility of residual or recurrent disease in this region is not excluded. Correlation with mammography and or breast ultrasound may be warranted if clinically indicated. 3. Mild centrilobular and paraseptal emphysema.  CT ABDOMEN AND PELVIS IMPRESSION  1. No definite evidence to suggest local recurrence of disease or metastatic disease in the abdomen or pelvis on today's examination. 2. Extensive atherosclerosis.   Electronically Signed   By: Vinnie Langton M.D.   On: 05/23/2013 08:56   Ct Abdomen Pelvis W Contrast  05/23/2013   CLINICAL DATA:  Followup evaluation for carcinoid tumor of the duodenum.  EXAM: CT  CHEST, ABDOMEN, AND PELVIS WITH CONTRAST  TECHNIQUE: Multidetector CT imaging of the chest, abdomen and pelvis was performed following the standard protocol during bolus administration of intravenous contrast.  CONTRAST:  172mL OMNIPAQUE IOHEXOL 300 MG/ML  SOLN  COMPARISON:  CT of the abdomen and pelvis 03/31/2013.  FINDINGS: CT CHEST FINDINGS  Mediastinum: Heart size is normal. There is no significant pericardial fluid, thickening or pericardial calcification. No pathologically enlarged mediastinal or hilar lymph nodes. Esophagus is unremarkable in appearance.  Lungs/Pleura: No suspicious appearing pulmonary nodules or masses are identified at this time. No acute consolidative airspace disease. No pleural effusions. Mild centrilobular and paraseptal emphysema, most apparent in the lung apices.  Musculoskeletal: There are no aggressive appearing lytic or blastic lesions noted in the visualized portions of the skeleton. Surgical clips in the left breast, presumably from prior lumpectomy. The previously noted large nodule in the left breast has been removed, however, inferior to the resection site, there are at least 2 areas of soft tissue nodularity which appear new compared the prior study, best demonstrated on image 38 of series 2, the largest of which measures approximately 1.2 cm. These are nonspecific.  CT ABDOMEN AND PELVIS FINDINGS  Abdomen/Pelvis: The appearance of the liver, gallbladder, pancreas, spleen, bilateral adrenal glands and bilateral kidneys is unremarkable. The duodenum is grossly normal in appearance on today's examination. No significant volume of ascites. No pneumoperitoneum. No pathologic distention of small bowel. Normal appendix. No definite pathologic lymphadenopathy identified within the abdomen or pelvis. Atherosclerosis throughout the abdominal and pelvic vasculature, without definite aneurysm or dissection. Uterus and ovaries are unremarkable in appearance. Urinary bladder is normal in  appearance.  Musculoskeletal: There are no aggressive appearing lytic or blastic lesions noted in the visualized portions of the skeleton.  IMPRESSION: CT CHEST IMPRESSION  1. No signs of metastatic disease to the lungs at this time. 2. Status post left breast lumpectomy. Adjacent to the lumpectomy site there are 2 areas of nodularity inferiorly which are nonspecific. These may represent areas of postoperative scarring, however, the possibility of residual or recurrent disease in this region is not excluded. Correlation with mammography and or breast ultrasound may be warranted if clinically indicated. 3. Mild centrilobular and paraseptal emphysema.  CT ABDOMEN AND PELVIS IMPRESSION  1. No definite evidence to suggest local recurrence of disease or metastatic disease in the abdomen or pelvis on today's examination. 2. Extensive atherosclerosis.   Electronically Signed   By: Vinnie Langton M.D.   On: 05/23/2013 08:56  LABS:    Chemistry      Component Value Date/Time   NA 145 01/05/2014 1040   NA 141 06/03/2013 1549   K 4.5 01/05/2014 1040   K 3.7 06/03/2013 1549   CL 104 06/03/2013 1549   CO2 25 01/05/2014 1040   CO2 27 06/03/2013 1549   BUN 15.2 01/05/2014 1040   BUN 9 06/03/2013 1549   CREATININE 0.8 01/05/2014 1040   CREATININE 0.91 06/03/2013 1549      Component Value Date/Time   CALCIUM 9.7 01/05/2014 1040   CALCIUM 9.1 06/03/2013 1549   ALKPHOS 77 01/05/2014 1040   ALKPHOS 66 06/03/2013 1549   AST 14 01/05/2014 1040   AST 15 06/03/2013 1549   ALT 12 01/05/2014 1040   ALT 16 06/03/2013 1549   BILITOT 0.32 01/05/2014 1040   BILITOT 0.3 06/03/2013 1549      Lab Results  Component Value Date   WBC 5.5 01/05/2014   HGB 13.6 01/05/2014   HCT 41.7 01/05/2014   MCV 88.3 01/05/2014   PLT 215 01/05/2014   PATHOLOGY: ADDITIONAL INFORMATION: 1. PROGNOSTIC INDICATORS - ACIS Results: IMMUNOHISTOCHEMICAL AND MORPHOMETRIC ANALYSIS BY THE AUTOMATED CELLULAR IMAGING SYSTEM (ACIS) Estrogen Receptor:  100%, POSITIVE, STRONG STAINING INTENSITY Progesterone Receptor: 100%, POSITIVE, STRONG STAINING INTENSITY REFERENCE RANGE ESTROGEN RECEPTOR NEGATIVE <1% POSITIVE =>1% PROGESTERONE RECEPTOR NEGATIVE <1% POSITIVE =>1% All controls stained appropriately Enid Cutter MD Pathologist, Electronic Signature ( Signed 04/07/2013) FINAL DIAGNOSIS Diagnosis 1. Breast, excision, Left - FIBROADENOMA WITH EXTENSIVE INVOLVEMENT WITH LOW GRADE CARCINOMA IN SITU. - ESTIMATED TUMOR SIZE IS 3.5 CM IN GREATEST DIMENSION. - LOW GRADE DUCTAL CARCINOMA IN SITU LESS THAN 0.1 CM FROM INKED MARGIN. - SEE ONCOLOGY TEMPLATE. 2. Small intestine, resection, Small - INVASIVE WELL DIFFERENTIATED NEUROENDOCRINE TUMOR (CARCINOID), SPANNING 2 CM IN GREATEST 1 of 3 Duplicate copy FINAL for ZAMAYAH, ENSOR B [phone]) Diagnosis(continued) DIMENSION. - TUMOR INVADES THROUGH MUSCULARIS PROPRIA INTO SUBSEROSAL TISSUES. - MARGINS ARE NEGATIVE. - TWO OF FIVE LYMPH NODES ARE POSITIVE FOR METASTATIC WELL DIFFERENTIATED NEUROENDOCRINE TUMOR (2/5). - SEE ONCOLOGY TEMPLATE. Microscopic Comment 1. BREAST, IN SITU CARCINOMA Specimen, including laterality: Left partial breast. Procedure: Left breast excision. Grade of carcinoma: Low grade. Necrosis: No. Estimated tumor size: (based on gross measurement): 3.5 cm. Treatment effect: N/A. Distance to closest margin: Less than 0.1 cm from unoriented inked margin. Breast prognostic profile: Not previously performed, will be performed on current case. Lymph nodes: Examined: 0. TNM: pTis, pNX. Comments: A Cytokeratin 5/6 immunohistochemical stain is performed on a representative block. The areas of epithelial proliferation fail to stain for cytokeratin 5/6. The morphology coupled with the staining pattern is consistent with the above diagnosis. A quantitative estrogen receptor and progesterone receptor will be performed on a representative block and reported in an addendum.  Dr. Lyndon Code has seen the first specimen in consultation with agreement that the findings represent a fibroadenoma with low grade ductal carcinoma in situ. (RAH:caf 04/04/13) 2. SMALL INTESTINE NEUROENDOCRINE TUMOR Histologic Type: Well differentiated neuroendocrine tumor (carcinoid). Histologic Grade: Well differentiated/low grade. Microscopic Tumor Extension: Tumor extends through muscularis propria into subserosal tissues. Resection Margins: All margins are negative including proximal and distal margins. Lymph-Vascular invasion: Definitive lymph-vascular invasion is not identified; however, lymph nodes are positive, see below. Perineural Invasion: Not identified. Lymph nodes: 2 of 5 lymph nodes positive for metastatic tumor. TNM code: pT3, pN1, MX. Additional Pathologic Findings: No additional significant pathologic abnormalities. Ancillary Studies: Can be performed upon request. Clinical History: Small bowel obstruction with small  bowel mass. Comments: Immunohistochemical stains are performed. The tumor is positive for cytokeratin AE1/AE3, chromogranin, synaptophysin, and CD56. The morphology coupled with the staining pattern is consistent with the above diagnosis. Dr. Lyndon Code is in agreement that the tumor extends through muscularis propria (pT3). 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 Patient is status post lumpectomy.   #2  she completed  radiation therapy on 09/05/2013.   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   #5. Abdominal pain with liver lesion on CT: MRI reveals well millimeter hypervascular lesion in the liver. Could be possibly metastatic carcinoid.   #6 PET scan performed performed in March 2015 revealed  hypodensity in the liver with no metabolic activity and mild metabolic activity noted in enteric  Anastomosis site  #7 we'll arrange for serum chromogranin A level and 24 hr  urine 5 HIAA and also octreotide scan to assess for metastasis from neuroendocrine tumor  #8 we'll also arrange for bilateral annual diagnostic mammogram in June 2015   Next followup visit in  3 months    All questions were answered. The patient knows to call the clinic with any problems, questions or concerns. We can certainly see the patient much sooner if necessary.  I spent 20 minutes counseling the patient face to face. The total time spent in the appointment was 10 minutes.  Marcy Panning, MD Medical/Oncology [facility] [phone] (beeper) [phone] (Office)

## 2014-01-08 ENCOUNTER — Telehealth: Payer: Self-pay | Admitting: Hematology and Oncology

## 2014-01-08 NOTE — Telephone Encounter (Signed)
, °

## 2014-01-09 LAB — CHROMOGRANIN A: CHROMOGRANIN A: 3.6 ng/mL (ref 1.9–15.0)

## 2014-01-12 LAB — 5 HIAA, QUANTITATIVE, URINE, 24 HOUR
5-HIAA, 24 Hr Urine: 3 mg/24 h (ref <=6.0)
Volume, Urine-5HIAA: 1900 mL/24 h

## 2014-01-14 ENCOUNTER — Encounter (HOSPITAL_COMMUNITY)
Admission: RE | Admit: 2014-01-14 | Discharge: 2014-01-14 | Disposition: A | Discharge status: Home / Self Care | Payer: Medicaid Other | Source: Ambulatory Visit | Attending: Hematology and Oncology | Admitting: Hematology and Oncology

## 2014-01-14 DIAGNOSIS — C787 Secondary malignant neoplasm of liver and intrahepatic bile duct: Secondary | ICD-10-CM | POA: Insufficient documentation

## 2014-01-15 ENCOUNTER — Encounter (HOSPITAL_COMMUNITY)
Admission: RE | Admit: 2014-01-15 | Discharge: 2014-01-15 | Disposition: A | Discharge status: Home / Self Care | Payer: Medicaid Other | Source: Ambulatory Visit | Attending: Hematology and Oncology | Admitting: Hematology and Oncology

## 2014-01-16 ENCOUNTER — Encounter (HOSPITAL_COMMUNITY)
Admission: RE | Admit: 2014-01-16 | Discharge: 2014-01-16 | Disposition: A | Discharge status: Home / Self Care | Payer: Medicaid Other | Source: Ambulatory Visit | Attending: Hematology and Oncology | Admitting: Hematology and Oncology

## 2014-01-16 MED ORDER — INDIUM IN-111 PENTETREOTIDE IV KIT
6.5000 | PACK | Freq: Once | INTRAVENOUS | Status: AC | PRN
  Administered 2014-01-16: 6.5 via INTRAVENOUS

## 2014-03-13 ENCOUNTER — Emergency Department (HOSPITAL_COMMUNITY): Payer: Medicaid Other

## 2014-03-13 ENCOUNTER — Encounter (HOSPITAL_COMMUNITY): Payer: Self-pay | Admitting: Emergency Medicine

## 2014-03-13 ENCOUNTER — Emergency Department (HOSPITAL_COMMUNITY)
Admission: EM | Admit: 2014-03-13 | Discharge: 2014-03-13 | Discharge status: Against Medical Advice | Payer: Medicaid Other | Attending: Internal Medicine | Admitting: Internal Medicine

## 2014-03-13 DIAGNOSIS — R51 Headache: Secondary | ICD-10-CM | POA: Diagnosis not present

## 2014-03-13 DIAGNOSIS — Z8742 Personal history of other diseases of the female genital tract: Secondary | ICD-10-CM | POA: Insufficient documentation

## 2014-03-13 DIAGNOSIS — R1012 Left upper quadrant pain: Secondary | ICD-10-CM | POA: Diagnosis present

## 2014-03-13 DIAGNOSIS — Z79899 Other long term (current) drug therapy: Secondary | ICD-10-CM | POA: Insufficient documentation

## 2014-03-13 DIAGNOSIS — K56609 Unspecified intestinal obstruction, unspecified as to partial versus complete obstruction: Secondary | ICD-10-CM | POA: Insufficient documentation

## 2014-03-13 DIAGNOSIS — F17200 Nicotine dependence, unspecified, uncomplicated: Secondary | ICD-10-CM | POA: Insufficient documentation

## 2014-03-13 DIAGNOSIS — Z8673 Personal history of transient ischemic attack (TIA), and cerebral infarction without residual deficits: Secondary | ICD-10-CM | POA: Insufficient documentation

## 2014-03-13 DIAGNOSIS — R110 Nausea: Secondary | ICD-10-CM | POA: Diagnosis not present

## 2014-03-13 DIAGNOSIS — F4323 Adjustment disorder with mixed anxiety and depressed mood: Secondary | ICD-10-CM | POA: Diagnosis not present

## 2014-03-13 DIAGNOSIS — Z862 Personal history of diseases of the blood and blood-forming organs and certain disorders involving the immune mechanism: Secondary | ICD-10-CM | POA: Diagnosis not present

## 2014-03-13 DIAGNOSIS — Z7982 Long term (current) use of aspirin: Secondary | ICD-10-CM | POA: Diagnosis not present

## 2014-03-13 DIAGNOSIS — Z8619 Personal history of other infectious and parasitic diseases: Secondary | ICD-10-CM | POA: Diagnosis not present

## 2014-03-13 DIAGNOSIS — M129 Arthropathy, unspecified: Secondary | ICD-10-CM | POA: Diagnosis not present

## 2014-03-13 DIAGNOSIS — Z8639 Personal history of other endocrine, nutritional and metabolic disease: Secondary | ICD-10-CM | POA: Insufficient documentation

## 2014-03-13 DIAGNOSIS — Z853 Personal history of malignant neoplasm of breast: Secondary | ICD-10-CM | POA: Insufficient documentation

## 2014-03-13 LAB — URINALYSIS, ROUTINE W REFLEX MICROSCOPIC
Bilirubin Urine: NEGATIVE
GLUCOSE, UA: NEGATIVE mg/dL
Hgb urine dipstick: NEGATIVE
KETONES UR: NEGATIVE mg/dL
LEUKOCYTES UA: NEGATIVE
Nitrite: NEGATIVE
PH: 7.5 (ref 5.0–8.0)
Protein, ur: NEGATIVE mg/dL
SPECIFIC GRAVITY, URINE: 1.011 (ref 1.005–1.030)
Urobilinogen, UA: 0.2 mg/dL (ref 0.0–1.0)

## 2014-03-13 LAB — CBC WITH DIFFERENTIAL/PLATELET
BASOS ABS: 0 10*3/uL (ref 0.0–0.1)
BASOS PCT: 0 % (ref 0–1)
Eosinophils Absolute: 0.1 10*3/uL (ref 0.0–0.7)
Eosinophils Relative: 2 % (ref 0–5)
HEMATOCRIT: 41.1 % (ref 36.0–46.0)
Hemoglobin: 13.8 g/dL (ref 12.0–15.0)
LYMPHS PCT: 18 % (ref 12–46)
Lymphs Abs: 1.5 10*3/uL (ref 0.7–4.0)
MCH: 29.5 pg (ref 26.0–34.0)
MCHC: 33.6 g/dL (ref 30.0–36.0)
MCV: 87.8 fL (ref 78.0–100.0)
MONO ABS: 0.5 10*3/uL (ref 0.1–1.0)
Monocytes Relative: 6 % (ref 3–12)
Neutro Abs: 6.6 10*3/uL (ref 1.7–7.7)
Neutrophils Relative %: 74 % (ref 43–77)
PLATELETS: 210 10*3/uL (ref 150–400)
RBC: 4.68 MIL/uL (ref 3.87–5.11)
RDW: 13.7 % (ref 11.5–15.5)
WBC: 8.8 10*3/uL (ref 4.0–10.5)

## 2014-03-13 LAB — COMPREHENSIVE METABOLIC PANEL
ALT: 19 U/L (ref 0–35)
AST: 22 U/L (ref 0–37)
Albumin: 4.1 g/dL (ref 3.5–5.2)
Alkaline Phosphatase: 70 U/L (ref 39–117)
Anion gap: 13 (ref 5–15)
BUN: 10 mg/dL (ref 6–23)
CALCIUM: 9.5 mg/dL (ref 8.4–10.5)
CO2: 25 mEq/L (ref 19–32)
CREATININE: 0.68 mg/dL (ref 0.50–1.10)
Chloride: 107 mEq/L (ref 96–112)
GFR calc Af Amer: >90 mL/min (ref >90)
GFR calc non Af Amer: >90 mL/min (ref >90)
Glucose, Bld: 101 mg/dL — ABNORMAL HIGH (ref 70–99)
Potassium: 3.9 mEq/L (ref 3.7–5.3)
SODIUM: 145 meq/L (ref 137–147)
Total Bilirubin: 0.6 mg/dL (ref 0.3–1.2)
Total Protein: 7.3 g/dL (ref 6.0–8.3)

## 2014-03-13 LAB — LIPASE, BLOOD: Lipase: 12 U/L (ref 11–59)

## 2014-03-13 LAB — I-STAT CG4 LACTIC ACID, ED: Lactic Acid, Venous: 0.54 mmol/L (ref 0.5–2.2)

## 2014-03-13 MED ORDER — ONDANSETRON HCL 4 MG/2ML IJ SOLN
4.0000 mg | Freq: Once | INTRAMUSCULAR | Status: AC
  Administered 2014-03-13: 4 mg via INTRAVENOUS
  Filled 2014-03-13: qty 2

## 2014-03-13 MED ORDER — HYDROMORPHONE HCL PF 1 MG/ML IJ SOLN
0.5000 mg | Freq: Once | INTRAMUSCULAR | Status: AC
  Administered 2014-03-13: 0.5 mg via INTRAVENOUS
  Filled 2014-03-13: qty 1

## 2014-03-13 MED ORDER — METOCLOPRAMIDE HCL 5 MG/ML IJ SOLN
5.0000 mg | Freq: Once | INTRAMUSCULAR | Status: AC
  Administered 2014-03-13: 5 mg via INTRAVENOUS
  Filled 2014-03-13: qty 2

## 2014-03-13 MED ORDER — HYDROMORPHONE HCL PF 1 MG/ML IJ SOLN
0.5000 mg | INTRAMUSCULAR | Status: AC
  Administered 2014-03-13: 0.5 mg via INTRAVENOUS
  Filled 2014-03-13: qty 1

## 2014-03-13 MED ORDER — IOHEXOL 300 MG/ML  SOLN
100.0000 mL | Freq: Once | INTRAMUSCULAR | Status: AC | PRN
  Administered 2014-03-13: 100 mL via INTRAVENOUS

## 2014-03-13 MED ORDER — SODIUM CHLORIDE 0.9 % IV BOLUS (SEPSIS)
1000.0000 mL | INTRAVENOUS | Status: AC
  Administered 2014-03-13: 1000 mL via INTRAVENOUS

## 2014-03-13 MED ORDER — IOHEXOL 300 MG/ML  SOLN
25.0000 mL | Freq: Once | INTRAMUSCULAR | Status: AC | PRN
  Administered 2014-03-13: 25 mL via ORAL

## 2014-03-13 MED ORDER — HYDROMORPHONE HCL PF 1 MG/ML IJ SOLN
1.0000 mg | INTRAMUSCULAR | Status: AC
  Administered 2014-03-13: 1 mg via INTRAVENOUS
  Filled 2014-03-13: qty 1

## 2014-03-13 MED ORDER — OXYCODONE-ACETAMINOPHEN 5-325 MG PO TABS: 1.0000 | ORAL_TABLET | ORAL | Status: DC | PRN

## 2014-03-13 MED ORDER — DIPHENHYDRAMINE HCL 50 MG/ML IJ SOLN
12.5000 mg | Freq: Once | INTRAMUSCULAR | Status: AC
  Administered 2014-03-13: 12.5 mg via INTRAVENOUS
  Filled 2014-03-13: qty 1

## 2014-03-13 MED ORDER — DOCUSATE SODIUM 100 MG PO CAPS: 100.0000 mg | ORAL_CAPSULE | Freq: Two times a day (BID) | ORAL | Status: DC

## 2014-03-13 NOTE — ED Notes (Signed)
Pt. Stated, Im having abdominal pain with nausea and it feels all bloated.

## 2014-03-13 NOTE — Discharge Instructions (Signed)
Intestinal Obstruction  An intestinal obstruction is a blockage of the intestine. It can be caused by a physical blockage or by a problem of abnormal function of the intestine.   CAUSES   · Adhesions from previous surgeries.  · Cancer or tumor.  · A hernia, which is a condition in which a portion of the bowel bulges out through an opening or weakness in the abdomen. This sometimes squeezes the bowel.  · A swallowed object.  · Blockage (impaction) with worms is common in third world countries.  · A twisting of the bowel or telescoping of a portion of the bowel into another portion (intussusceptions).  · Anything that stops food from going through from the stomach to the anus.  SYMPTOMS   Symptoms of bowel obstruction may include abdominal bloating, nausea, vomiting, explosive diarrhea, or explosive stool. You may not be able to hear your normal bowel sounds (such as "growling in your stomach"). You may also stop having bowel movements or passing gas.  DIAGNOSIS   Usually this condition is diagnosed with a history and an examination. Often, lab studies (blood work) and X-rays may be used to find the cause.  TREATMENT   The main treatment for this condition is to rest the intestine. Often, the obstruction may relieve itself and allow the intestine to start working again. Think of the intestine like a balloon that is blown up (filled with trapped food and water that has squeezed into a hole or area that it cannot get through).   · If the obstruction is complete, a nasogastric (NG) tube is passed through the nose and into the stomach. It is then connected to suction to keep the stomach emptied out. This also helps treat the nausea and vomiting.  · If there is an imbalance in the electrolytes, they are corrected with intravenous fluids. These fluids have the proper chemicals in them to correct the problem.  · If the reason for the blockage does not get better with conservative (nonsurgical) treatment, surgery may be  necessary. Sometimes, surgery is done immediately if your surgeon knows that the problem is not going to get better with conservative treatment.  PROGNOSIS   Depending on what the problem is, most of these problems can be treated by your caregivers with good results. Your caregiver will discuss with you the best course of action to take.  FOLLOWING SURGERY  Seek immediate medical attention if you have:  · Increasing abdominal pain, repeated vomiting, dehydration, or fainting.  · Severe weakness, chest pain, or back pain.  · Blood in your vomit or stool.  · Tarry stool.  Document Released: 11/18/2003 Document Revised: 12/23/2012 Document Reviewed: 04/17/2008  [employer]® Patient Information ©2015 [employer]. This information is not intended to replace advice given to you by your health care provider. Make sure you discuss any questions you have with your health care provider.

## 2014-03-13 NOTE — ED Notes (Signed)
Family at bedside. 

## 2014-03-13 NOTE — ED Provider Notes (Signed)
CSN: [id number]     Arrival date & time 03/13/14  1134 History   First MD Initiated Contact with Patient 03/13/14 1137     Chief Complaint  Patient presents with  . Abdominal Pain  . Nausea     (Consider location/radiation/quality/duration/timing/severity/associated sxs/prior Treatment) Patient is a 58 y.o. female presenting with abdominal pain. The history is provided by the patient.  Abdominal Pain Pain location:  LUQ Pain quality: aching and bloating   Pain radiates to:  Does not radiate Pain severity:  Moderate Onset quality:  Sudden Duration:  8 hours Timing:  Constant Progression:  Waxing and waning Chronicity:  New Context comment:  At rest Relieved by:  Nothing Worsened by:  Nothing tried Ineffective treatments:  None tried Associated symptoms: nausea   Associated symptoms: no chest pain, no cough, no diarrhea, no dysuria, no fatigue, no fever, no hematuria, no shortness of breath and no vomiting     Past Medical History  Diagnosis Date  . Hepatitis C   . Jaundice   . Breast disorder     lump in left breast  . TIA (transient ischemic attack) OCT 2012  . Hyperlipidemia   . Varicose veins   . Adjustment disorder with mixed anxiety and depressed mood 01/05/2013  . Allergy   . Anxiety   . Arthritis     knees,wrists   . Pain management     goes to [facility] clinic, but she doesn't intend to return   . H/O echocardiogram     not finding report in EPIC, pt. reports that it was before her appt. /w Dr. Johnsie Cancel   . Stroke 2012    TIA  . Brain aneurysm 2012    2cms.  . Complication of anesthesia     hallucinations , anxiety   . Malignant neoplasm of lower-outer quadrant of female breast 05/19/2013  . Hx of radiation therapy 07/17/13-09/03/13    left breast,60.4Gy, 32/33 fx completed   Past Surgical History  Procedure Laterality Date  . Laparoscopy N/A 04/02/2013    Procedure: Diaganostic Lap.; Small Bowel Resection;  Surgeon: Harl Bowie, MD;  Location: [place];   Service: General;  Laterality: N/A;  . Breast lumpectomy Left 04/02/2013    Procedure: Excision  of Left Breast Mass;  Surgeon: Harl Bowie, MD;  Location: [place];  Service: General;  Laterality: Left;  . Colon surgery  03/2013    bowel obstruction , malignant tumor    . Vaginal births   46     birthed twins at -[redacted] weeks gestation - both survived , total of 3 pregnancies - all born vaginally  . Breast lumpectomy with sentinel lymph node biopsy Left 06/05/2013    Procedure: BREAST LUMPECTOMY WITH SENTINEL LYMPH NODE BX;  Surgeon: Harl Bowie, MD;  Location: [place];  Service: General;  Laterality: Left;  . Evacuation breast hematoma Left 06/05/2013    Procedure: EVACUATION HEMATOMA BREAST;  Surgeon: Harl Bowie, MD;  Location: [place];  Service: General;  Laterality: Left;   Family History  Problem Relation Age of Onset  . Diabetes Paternal Grandfather   . Diabetes Paternal Grandmother   . Cancer Father     lung  . Diabetes Mother   . Heart disease Mother   . Cancer Mother     cervical  . Diabetes Brother   . Stroke Brother   . Heart disease Sister   . Diabetes Sister   . Cancer Sister     cervical  History  Substance Use Topics  . Smoking status: Current Every Day Smoker -- 0.50 packs/day for 35 years  . Smokeless tobacco: Never Used     Comment: cut down to  10 cigarettes daily 06/25/13  . Alcohol Use: Yes     Comment: seldom- rare   OB History   Grav Para Term Preterm Abortions TAB SAB Ect Mult Living   5 4  2 1 1    4      Review of Systems  Constitutional: Negative for fever and fatigue.  HENT: Negative for congestion and drooling.   Eyes: Negative for pain.  Respiratory: Negative for cough and shortness of breath.   Cardiovascular: Negative for chest pain.  Gastrointestinal: Positive for nausea and abdominal pain. Negative for vomiting and diarrhea.  Genitourinary: Negative for dysuria and hematuria.  Musculoskeletal: Negative for back pain, gait  problem and neck pain.  Skin: Negative for color change.  Neurological: Positive for headaches. Negative for dizziness.  Hematological: Negative for adenopathy.  Psychiatric/Behavioral: Negative for behavioral problems.  All other systems reviewed and are negative.     Allergies  Statins; Ace inhibitors; Ciprofloxacin hcl; and Tramadol  Home Medications   Prior to Admission medications   Medication Sig Start Date End Date Taking? Authorizing Provider  ALPRAZolam Duanne Moron) 1 MG tablet Take 1 mg by mouth 3 (three) times daily. 07/31/13   Historical Provider, MD  aspirin EC 81 MG tablet Take 81 mg by mouth 2 (two) times daily.    Historical Provider, MD  ibuprofen (ADVIL,MOTRIN) 200 MG tablet Take 200 mg by mouth every 6 (six) hours as needed.    Historical Provider, MD  Multiple Vitamins-Minerals (THERA-M) TABS Take 1 tablet by mouth daily.    Historical Provider, MD  nicotine (NICODERM CQ - DOSED IN MG/24 HOURS) 21 mg/24hr patch Place 1 patch onto the skin daily. 10/07/13 10/07/14  Historical Provider, MD   BP 153/103  Pulse 64  Temp(Src) 97.7 F (36.5 C) (Oral)  Resp 21  Ht 5\' 6"  (1.676 m)  Wt 167 lb (75.751 kg)  BMI 26.97 kg/m2  SpO2 99% Physical Exam  Nursing note and vitals reviewed. Constitutional: She is oriented to person, place, and time. She appears well-developed and well-nourished.  HENT:  Head: Normocephalic and atraumatic.  Mouth/Throat: Oropharynx is clear and moist. No oropharyngeal exudate.  Eyes: Conjunctivae and EOM are normal. Pupils are equal, round, and reactive to light.  Neck: Normal range of motion. Neck supple.  Cardiovascular: Normal rate, regular rhythm, normal heart sounds and intact distal pulses.  Exam reveals no gallop and no friction rub.   No murmur heard. Pulmonary/Chest: Effort normal and breath sounds normal. No respiratory distress. She has no wheezes.  Abdominal: Soft. Bowel sounds are normal. There is tenderness (mild tenderness to  palpation of the left upper quadrant). There is no rebound and no guarding.  Musculoskeletal: Normal range of motion. She exhibits no edema and no tenderness.  Neurological: She is alert and oriented to person, place, and time.  alert, oriented x3 speech: normal in context and clarity memory: intact grossly cranial nerves II-XII: intact motor strength: full proximally and distally no involuntary movements or tremors sensation: intact to light touch diffusely  cerebellar: finger-to-nose and heel-to-shin intact gait: deferred d/t abd pain   Skin: Skin is warm and dry.  Psychiatric: She has a normal mood and affect. Her behavior is normal.    ED Course  Procedures (including critical care time) Labs Review Labs Reviewed  COMPREHENSIVE  METABOLIC PANEL - Abnormal; Notable for the following:    Glucose, Bld 101 (*)    All other components within normal limits  CBC WITH DIFFERENTIAL  LIPASE, BLOOD  URINALYSIS, ROUTINE W REFLEX MICROSCOPIC  I-STAT CG4 LACTIC ACID, ED    Imaging Review Ct Head Wo Contrast  03/13/2014   CLINICAL DATA:  Headaches for 2 weeks, history of breast cancer  EXAM: CT HEAD WITHOUT CONTRAST  TECHNIQUE: Contiguous axial images were obtained from the base of the skull through the vertex without intravenous contrast.  COMPARISON:  12/24/2011  FINDINGS: There is no evidence of mass effect, midline shift or extra-axial fluid collections. There is no evidence of a space-occupying lesion or intracranial hemorrhage. There is no evidence of a cortical-based area of acute infarction.  The ventricles and sulci are appropriate for the patient's age. The basal cisterns are patent.  Visualized portions of the orbits are unremarkable. The visualized portions of the paranasal sinuses and mastoid air cells are unremarkable.  The osseous structures are unremarkable.  IMPRESSION: No acute intracranial pathology.   Electronically Signed   By: Kathreen Devoid   On: 03/13/2014 13:43   Ct  Abdomen Pelvis W Contrast  03/13/2014   CLINICAL DATA:  Abdominal pain.  History of carcinoid tumor  EXAM: CT ABDOMEN AND PELVIS WITH CONTRAST  TECHNIQUE: Multidetector CT imaging of the abdomen and pelvis was performed using the standard protocol following bolus administration of intravenous contrast.  CONTRAST:  115mL OMNIPAQUE IOHEXOL 300 MG/ML  SOLN  COMPARISON:  PET-CT 11/26/2013, MR abdomen 10/30/2013, CT abdomen 03/31/2013  FINDINGS: The lung bases are clear.  The liver demonstrates no focal abnormality. There is no intrahepatic or extrahepatic biliary ductal dilatation. The gallbladder is normal. The spleen demonstrates no focal abnormality. The kidneys, adrenal glands and pancreas are normal. The bladder is unremarkable.  There is small bowel dilatation involving the jejunum with a transition zone in the mid lower abdomen at the site of prior small bowel resection and anastomosis. There is fecal material proximal to the anastomotic suture line. There is a small amount of fluid and inflammation surrounding the bowel just proximal to the anastomotic suture line. The bowel distal to the anastomosis is relatively decompressed. The colon is decompressed. There is a normal caliber appendix in the right lower quadrant without periappendiceal inflammatory changes. There is no pneumoperitoneum, pneumatosis, or portal venous gas. There is no abdominal or pelvic free fluid. There is no lymphadenopathy.  The abdominal aorta is normal in caliber with atherosclerosis.  There are no lytic or sclerotic osseous lesions.  IMPRESSION: Small bowel obstruction with a transition zone in the mid lower abdomen at the site of an enteroenteric anastomosis.   Electronically Signed   By: Kathreen Devoid   On: 03/13/2014 14:03     EKG Interpretation None      MDM   Final diagnoses:  SBO (small bowel obstruction)    11:47 AM 58 y.o. female w hx of hep C, CVA, sbo's, breast cancer, small bowel neuroendocrine tumor s/p  resection and liver lesion of indeterminate significance who pw abd pain. She states that her pain began this morning at approximately 4 AM. She has had nausea but no vomiting. She denies any fevers. Last bowel movement was today and was normal. She also notes intermittent headache x2 weeks. Headache is currently 4/10 and in the right frontal area. She notes that she removed a tick several weeks ago. She denies fevers or rash, doubt rmsf.  She is  afebrile and vital signs are unremarkable here. Will get screening labs and imaging. Pain control Dilaudid.   4:20 PM: Pt found to have sbo. I arranged admission, pt refused. She would like to leave AMA. I checked a lactic acid ptd and it was normal. Pt has decision making capacity and understands that by leaving she will not be monitored in a medical setting and could have worsening of her bowel obstruction leading to worsening pain, metabolic disturbances, infarction of bowel, undesired consequences, disability, or death. We also discussed returning to the ED immediately if new or worsening sx occur. We discussed the sx which are most concerning (e.g., worsening pain, fever, inc vomiting) that necessitate immediate return. Medications administered to the patient during their visit and any new prescriptions provided to the patient are listed below.  Medications given during this visit Medications  HYDROmorphone (DILAUDID) injection 1 mg (1 mg Intravenous Given 03/13/14 1220)  sodium chloride 0.9 % bolus 1,000 mL (0 mLs Intravenous Stopped 03/13/14 1352)  ondansetron (ZOFRAN) injection 4 mg (4 mg Intravenous Given 03/13/14 1220)  metoCLOPramide (REGLAN) injection 5 mg (5 mg Intravenous Given 03/13/14 1220)  diphenhydrAMINE (BENADRYL) injection 12.5 mg (12.5 mg Intravenous Given 03/13/14 1219)  iohexol (OMNIPAQUE) 300 MG/ML solution 25 mL (25 mLs Oral Contrast Given 03/13/14 1230)  iohexol (OMNIPAQUE) 300 MG/ML solution 100 mL (100 mLs Intravenous Contrast Given 03/13/14  1327)  HYDROmorphone (DILAUDID) injection 0.5 mg (0.5 mg Intravenous Given 03/13/14 1418)  HYDROmorphone (DILAUDID) injection 0.5 mg (0.5 mg Intravenous Given 03/13/14 1549)    New Prescriptions   DOCUSATE SODIUM (COLACE) 100 MG CAPSULE    Take 1 capsule (100 mg total) by mouth every 12 (twelve) hours.   OXYCODONE-ACETAMINOPHEN (PERCOCET) 5-325 MG PER TABLET    Take 1 tablet by mouth every 4 (four) hours as needed.     Blanchard Kelch, MD 03/14/14 1052

## 2014-03-13 NOTE — ED Notes (Signed)
MD at bedside. 

## 2014-03-13 NOTE — ED Notes (Signed)
Pt taken to radiology

## 2014-03-13 NOTE — ED Notes (Signed)
Patient here for abd pain, onset this am at 5, hx of carcinoma tumor in abd in past, pulled tick off back recently, pain constant and has worse pain at times in spells. Pt moaning, pt sts she needs to pass gas but cant.

## 2014-03-26 ENCOUNTER — Encounter (HOSPITAL_COMMUNITY): Payer: Self-pay | Admitting: Emergency Medicine

## 2014-03-26 ENCOUNTER — Emergency Department (HOSPITAL_COMMUNITY): Payer: Medicaid Other

## 2014-03-26 ENCOUNTER — Emergency Department (HOSPITAL_COMMUNITY)
Admission: EM | Admit: 2014-03-26 | Discharge: 2014-03-26 | Disposition: A | Discharge status: Home / Self Care | Payer: Medicaid Other | Attending: Emergency Medicine | Admitting: Emergency Medicine

## 2014-03-26 DIAGNOSIS — Z923 Personal history of irradiation: Secondary | ICD-10-CM | POA: Insufficient documentation

## 2014-03-26 DIAGNOSIS — Z862 Personal history of diseases of the blood and blood-forming organs and certain disorders involving the immune mechanism: Secondary | ICD-10-CM | POA: Insufficient documentation

## 2014-03-26 DIAGNOSIS — R197 Diarrhea, unspecified: Secondary | ICD-10-CM | POA: Diagnosis not present

## 2014-03-26 DIAGNOSIS — Z8673 Personal history of transient ischemic attack (TIA), and cerebral infarction without residual deficits: Secondary | ICD-10-CM | POA: Diagnosis not present

## 2014-03-26 DIAGNOSIS — F17200 Nicotine dependence, unspecified, uncomplicated: Secondary | ICD-10-CM | POA: Insufficient documentation

## 2014-03-26 DIAGNOSIS — R109 Unspecified abdominal pain: Secondary | ICD-10-CM | POA: Diagnosis not present

## 2014-03-26 DIAGNOSIS — M129 Arthropathy, unspecified: Secondary | ICD-10-CM | POA: Insufficient documentation

## 2014-03-26 DIAGNOSIS — Z9189 Other specified personal risk factors, not elsewhere classified: Secondary | ICD-10-CM | POA: Insufficient documentation

## 2014-03-26 DIAGNOSIS — F411 Generalized anxiety disorder: Secondary | ICD-10-CM | POA: Diagnosis not present

## 2014-03-26 DIAGNOSIS — Z79899 Other long term (current) drug therapy: Secondary | ICD-10-CM | POA: Insufficient documentation

## 2014-03-26 DIAGNOSIS — R110 Nausea: Secondary | ICD-10-CM | POA: Insufficient documentation

## 2014-03-26 DIAGNOSIS — Z8639 Personal history of other endocrine, nutritional and metabolic disease: Secondary | ICD-10-CM | POA: Diagnosis not present

## 2014-03-26 DIAGNOSIS — Z7982 Long term (current) use of aspirin: Secondary | ICD-10-CM | POA: Insufficient documentation

## 2014-03-26 DIAGNOSIS — Z853 Personal history of malignant neoplasm of breast: Secondary | ICD-10-CM | POA: Insufficient documentation

## 2014-03-26 DIAGNOSIS — K922 Gastrointestinal hemorrhage, unspecified: Secondary | ICD-10-CM | POA: Diagnosis present

## 2014-03-26 LAB — COMPREHENSIVE METABOLIC PANEL
ALK PHOS: 62 U/L (ref 39–117)
ALT: 14 U/L (ref 0–35)
AST: 15 U/L (ref 0–37)
Albumin: 3.6 g/dL (ref 3.5–5.2)
Anion gap: 15 (ref 5–15)
BUN: 9 mg/dL (ref 6–23)
CO2: 22 mEq/L (ref 19–32)
Calcium: 9.1 mg/dL (ref 8.4–10.5)
Chloride: 104 mEq/L (ref 96–112)
Creatinine, Ser: 0.65 mg/dL (ref 0.50–1.10)
GFR calc non Af Amer: >90 mL/min (ref >90)
GLUCOSE: 89 mg/dL (ref 70–99)
POTASSIUM: 4.1 meq/L (ref 3.7–5.3)
Sodium: 141 mEq/L (ref 137–147)
Total Bilirubin: 0.4 mg/dL (ref 0.3–1.2)
Total Protein: 6.4 g/dL (ref 6.0–8.3)

## 2014-03-26 LAB — CBC WITH DIFFERENTIAL/PLATELET
Basophils Absolute: 0 10*3/uL (ref 0.0–0.1)
Basophils Relative: 0 % (ref 0–1)
Eosinophils Absolute: 0.2 10*3/uL (ref 0.0–0.7)
Eosinophils Relative: 3 % (ref 0–5)
HEMATOCRIT: 38.4 % (ref 36.0–46.0)
HEMOGLOBIN: 12.5 g/dL (ref 12.0–15.0)
LYMPHS ABS: 1.4 10*3/uL (ref 0.7–4.0)
Lymphocytes Relative: 25 % (ref 12–46)
MCH: 28.6 pg (ref 26.0–34.0)
MCHC: 32.6 g/dL (ref 30.0–36.0)
MCV: 87.9 fL (ref 78.0–100.0)
MONOS PCT: 7 % (ref 3–12)
Monocytes Absolute: 0.4 10*3/uL (ref 0.1–1.0)
NEUTROS PCT: 65 % (ref 43–77)
Neutro Abs: 3.7 10*3/uL (ref 1.7–7.7)
Platelets: 220 10*3/uL (ref 150–400)
RBC: 4.37 MIL/uL (ref 3.87–5.11)
RDW: 13.5 % (ref 11.5–15.5)
WBC: 5.6 10*3/uL (ref 4.0–10.5)

## 2014-03-26 LAB — URINALYSIS, ROUTINE W REFLEX MICROSCOPIC
BILIRUBIN URINE: NEGATIVE
Glucose, UA: NEGATIVE mg/dL
Hgb urine dipstick: NEGATIVE
Ketones, ur: NEGATIVE mg/dL
Leukocytes, UA: NEGATIVE
Nitrite: NEGATIVE
PROTEIN: NEGATIVE mg/dL
Specific Gravity, Urine: 1.011 (ref 1.005–1.030)
UROBILINOGEN UA: 0.2 mg/dL (ref 0.0–1.0)
pH: 5.5 (ref 5.0–8.0)

## 2014-03-26 MED ORDER — HYDROMORPHONE HCL PF 1 MG/ML IJ SOLN
1.0000 mg | INTRAMUSCULAR | Status: AC | PRN
  Administered 2014-03-26 (×2): 1 mg via INTRAVENOUS
  Filled 2014-03-26 (×2): qty 1

## 2014-03-26 MED ORDER — IOHEXOL 300 MG/ML  SOLN
80.0000 mL | Freq: Once | INTRAMUSCULAR | Status: AC | PRN
  Administered 2014-03-26: 100 mL via INTRAVENOUS

## 2014-03-26 MED ORDER — DOCUSATE SODIUM 100 MG PO CAPS: 100.0000 mg | ORAL_CAPSULE | Freq: Two times a day (BID) | ORAL | Status: DC

## 2014-03-26 MED ORDER — SODIUM CHLORIDE 0.9 % IV BOLUS (SEPSIS)
1000.0000 mL | Freq: Once | INTRAVENOUS | Status: AC
  Administered 2014-03-26: 1000 mL via INTRAVENOUS

## 2014-03-26 MED ORDER — SODIUM CHLORIDE 0.9 % IV SOLN: Freq: Once | INTRAVENOUS | Status: DC

## 2014-03-26 MED ORDER — ONDANSETRON HCL 4 MG/2ML IJ SOLN
4.0000 mg | Freq: Once | INTRAMUSCULAR | Status: AC
  Administered 2014-03-26: 4 mg via INTRAVENOUS
  Filled 2014-03-26: qty 2

## 2014-03-26 NOTE — ED Provider Notes (Signed)
CSN: [id number]     Arrival date & time 03/26/14  4098 History   First MD Initiated Contact with Patient 03/26/14 1114     Chief Complaint  Patient presents with  . Abdominal Pain  . GI Bleeding      HPI  Patient presents with abdominal pain. His history of breast cancer status post resection. 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 Status post resection of her small bowel tumor in 2014 with primary anastomosis.  Had CAT scan in March of this year that showed activity mid abdomen with differential diagnosis including postsurgical changes versus recurrence. Of note her primary resection did have negative margins.  Seen and evaluated here July 3. Had a CT scan showing small bowel obstruction with transition point at anastomosis. Left AGAINST MEDICAL ADVICE. She states "that young man taking care of  me couldn't have been a doctor he looked like he was 58 years old and look like Jesus". She states she went home and drank a bottle of caster oil. She states that she had eaten corn the day before. States the next morning she "pooped a whole toilet full of corn".. She states that she felt better. She left the hospital because "I've grandchildren that I have to take care of".  Started having pain again a few days ago. States that she had some hard bowel movements 2 days ago yesterday passes stool with a little bit of blood. Has had 4 bowel movements today that were loose. No blood in her stool today. She feels more distended with colicky intermittent pain in her abdomen.   Past Medical History  Diagnosis Date  . Hepatitis C   . Jaundice   . Breast disorder     lump in left breast  . TIA (transient ischemic attack) OCT 2012  . Hyperlipidemia   . Varicose veins   . Adjustment disorder with mixed anxiety and depressed mood 01/05/2013  . Allergy   . Anxiety   . Arthritis     knees,wrists   . Pain management     goes to [facility] clinic, but she doesn't intend  to return   . H/O echocardiogram     not finding report in EPIC, pt. reports that it was before her appt. /w Dr. Johnsie Cancel   . Stroke 2012    TIA  . Brain aneurysm 2012    2cms.  . Complication of anesthesia     hallucinations , anxiety   . Malignant neoplasm of lower-outer quadrant of female breast 05/19/2013  . Hx of radiation therapy 07/17/13-09/03/13    left breast,60.4Gy, 32/33 fx completed   Past Surgical History  Procedure Laterality Date  . Laparoscopy N/A 04/02/2013    Procedure: Diaganostic Lap.; Small Bowel Resection;  Surgeon: Harl Bowie, MD;  Location: [place];  Service: General;  Laterality: N/A;  . Breast lumpectomy Left 04/02/2013    Procedure: Excision  of Left Breast Mass;  Surgeon: Harl Bowie, MD;  Location: [place];  Service: General;  Laterality: Left;  . Colon surgery  03/2013    bowel obstruction , malignant tumor    . Vaginal births   29     birthed twins at -[redacted] weeks gestation - both survived , total of 3 pregnancies - all born vaginally  . Breast lumpectomy with sentinel lymph node biopsy Left 06/05/2013    Procedure: BREAST LUMPECTOMY WITH SENTINEL LYMPH NODE BX;  Surgeon: Harl Bowie, MD;  Location:  [place] [facility];  Service: General;  Laterality: Left;  . Evacuation breast hematoma Left 06/05/2013    Procedure: EVACUATION HEMATOMA BREAST;  Surgeon: Harl Bowie, MD;  Location: [place];  Service: General;  Laterality: Left;   Family History  Problem Relation Age of Onset  . Diabetes Paternal Grandfather   . Diabetes Paternal Grandmother   . Cancer Father     lung  . Diabetes Mother   . Heart disease Mother   . Cancer Mother     cervical  . Diabetes Brother   . Stroke Brother   . Heart disease Sister   . Diabetes Sister   . Cancer Sister     cervical   History  Substance Use Topics  . Smoking status: Current Every Day Smoker -- 0.50 packs/day for 35 years  . Smokeless tobacco: Never Used     Comment: cut down to  10 cigarettes daily  06/25/13  . Alcohol Use: Yes     Comment: seldom- rare   OB History   Grav Para Term Preterm Abortions TAB SAB Ect Mult Living   5 4  2 1 1    4      Review of Systems  Constitutional: Negative for fever, chills, diaphoresis, appetite change and fatigue.  HENT: Negative for mouth sores, sore throat and trouble swallowing.   Eyes: Negative for visual disturbance.  Respiratory: Negative for cough, chest tightness, shortness of breath and wheezing.   Cardiovascular: Negative for chest pain.  Gastrointestinal: Positive for nausea, abdominal pain, diarrhea, blood in stool and abdominal distention. Negative for vomiting.  Endocrine: Negative for polydipsia, polyphagia and polyuria.  Genitourinary: Negative for dysuria, frequency and hematuria.  Musculoskeletal: Negative for gait problem.  Skin: Negative for color change, pallor and rash.  Neurological: Negative for dizziness, syncope, light-headedness and headaches.  Hematological: Does not bruise/bleed easily.  Psychiatric/Behavioral: Negative for behavioral problems and confusion.      Allergies  Statins; Ace inhibitors; Ciprofloxacin hcl; and Tramadol  Home Medications   Prior to Admission medications   Medication Sig Start Date End Date Taking? Authorizing Provider  ALPRAZolam Duanne Moron) 1 MG tablet Take 1 mg by mouth 3 (three) times daily. 07/31/13  Yes Historical Provider, MD  aspirin EC 81 MG tablet Take 162 mg by mouth at bedtime.    Yes Historical Provider, MD  docusate sodium (COLACE) 100 MG capsule Take 1 capsule (100 mg total) by mouth every 12 (twelve) hours. 03/13/14  Yes Blanchard Kelch, MD  oxyCODONE-acetaminophen (PERCOCET) 5-325 MG per tablet Take 1 tablet by mouth every 4 (four) hours as needed. 03/13/14  Yes Blanchard Kelch, MD  psyllium (METAMUCIL) 58.6 % packet Take 1 packet by mouth daily as needed (for constipation).   Yes Historical Provider, MD   BP 123/83  Pulse 48  Temp(Src) 97.6 F (36.4 C) (Oral)   Resp 13  Ht 5\' 6"  (1.676 m)  Wt 158 lb (71.668 kg)  BMI 25.51 kg/m2  SpO2 95% Physical Exam  Constitutional: She is oriented to person, place, and time. She appears well-developed and well-nourished. No distress.  HENT:  Head: Normocephalic.  Eyes: Conjunctivae are normal. Pupils are equal, round, and reactive to light. No scleral icterus.  Neck: Normal range of motion. Neck supple. No thyromegaly present.  Cardiovascular: Normal rate and regular rhythm.  Exam reveals no gallop and no friction rub.   No murmur heard. Pulmonary/Chest: Effort normal and breath sounds normal. No respiratory distress. She has no wheezes. She has no  rales.  Abdominal: Soft. Bowel sounds are normal. She exhibits no distension. There is no tenderness. There is no rebound.    Mild diffuse tenderness. No rigidity or peritoneal irritation. No high-pitched rushes of breath sounds. L. sounds present, but slightly hypoactive.  Musculoskeletal: Normal range of motion.  Neurological: She is alert and oriented to person, place, and time.  Skin: Skin is warm and dry. No rash noted.  Psychiatric: She has a normal mood and affect. Her behavior is normal.    ED Course  Procedures (including critical care time) Labs Review Labs Reviewed  CBC WITH DIFFERENTIAL  COMPREHENSIVE METABOLIC PANEL  URINALYSIS, ROUTINE W REFLEX MICROSCOPIC  LIPASE, BLOOD    Imaging Review Ct Abdomen Pelvis W Contrast  03/26/2014   CLINICAL DATA:  Abdominal pain and swelling. Blood in the stool. Neuroendocrine tumor in the bowel resected in 2014.  EXAM: CT ABDOMEN AND PELVIS WITH CONTRAST  TECHNIQUE: Multidetector CT imaging of the abdomen and pelvis was performed using the standard protocol following bolus administration of intravenous contrast.  CONTRAST:  136mL OMNIPAQUE IOHEXOL 300 MG/ML  SOLN  COMPARISON:  CT scan dated 03/13/2014  FINDINGS: The dilatation of small bowel seen on the prior study has completely resolved. The site of the  enteroenteric anastomosis in the mid pelvis was the site of obstruction but canal contrast is passing through the anastomosis. There is slight prominence of the soft tissues at the site of the anastomosis which most likely represents postsurgical scarring. I cannot exclude a tiny area of recurrent tumor. There was a small focus of increased activity on PET-CT at that site on 11/26/2013.  The liver, biliary tree, spleen, pancreas, adrenal glands, and kidneys are normal. No free air or free fluid. Appendix and terminal ileum were normal. Uterus and ovaries and bladder are normal. No osseous abnormality.  IMPRESSION: Complete resolution of the small bowel obstruction seen on the prior study. There is slight prominence of soft tissues at the enteroenteric anastomosis most likely represents scarring but I cannot exclude a small site of recurrent tumor.   Electronically Signed   By: Rozetta Nunnery M.D.   On: 03/26/2014 14:15     EKG Interpretation None      MDM   Final diagnoses:  Abdominal pain, unspecified abdominal location    Abdominal pain with history of small bowel obstruction and small bowel resection for tumor and question of recurrence, persistence of disease on CAT scan versus postsurgical changes. Plan a CT imaging as evaluation for all the above. Pain control. Lab evaluation.  CT scan shows prominence of soft tissues at the anastomosis site. I discussed with her that this is a similar finding as noted on her PET scan with increased activity. This could represent persistence or recurrence of disease,  postsurgical changes. I strongly recommended that she followup with her oncologist or GI physician.    Tanna Furry, MD 03/26/14 1640

## 2014-03-26 NOTE — ED Notes (Signed)
Was seen on 7/3 and dx w/ bowel obst and they wanted her to stay she would not , she was to see  Her PCP and did not now abd swelling and she is seeing blood in stool has hx of cancer in bowel

## 2014-03-26 NOTE — Discharge Instructions (Signed)
Your CT scan today shows an abnormal area near your previous surgical anastomosis. This may simply be a postsurgical change, but could represent persistence of your cancer. This is a similar finding noted on your PET scan. Followup with your oncologist or GI physician. Liquid diet today.  Colace and fiber daily.  Abdominal Pain Many things can cause abdominal pain. Usually, abdominal pain is not caused by a disease and will improve without treatment. It can often be observed and treated at home. Your health care provider will do a physical exam and possibly order blood tests and X-rays to help determine the seriousness of your pain. However, in many cases, more time must pass before a clear cause of the pain can be found. Before that point, your health care provider may not know if you need more testing or further treatment. HOME CARE INSTRUCTIONS  Monitor your abdominal pain for any changes. The following actions may help to alleviate any discomfort you are experiencing:  Only take over-the-counter or prescription medicines as directed by your health care provider.  Do not take laxatives unless directed to do so by your health care provider.  Try a clear liquid diet (broth, tea, or water) as directed by your health care provider. Slowly move to a bland diet as tolerated. SEEK MEDICAL CARE IF:  You have unexplained abdominal pain.  You have abdominal pain associated with nausea or diarrhea.  You have pain when you urinate or have a bowel movement.  You experience abdominal pain that wakes you in the night.  You have abdominal pain that is worsened or improved by eating food.  You have abdominal pain that is worsened with eating fatty foods.  You have a fever. SEEK IMMEDIATE MEDICAL CARE IF:   Your pain does not go away within 2 hours.  You keep throwing up (vomiting).  Your pain is felt only in portions of the abdomen, such as the right side or the left lower portion of the  abdomen.  You pass bloody or black tarry stools. MAKE SURE YOU:  Understand these instructions.   Will watch your condition.   Will get help right away if you are not doing well or get worse.  Document Released: 06/07/2005 Document Revised: 09/02/2013 Document Reviewed: 05/07/2013 [facility] Patient Information 2015 [place], [place]. This information is not intended to replace advice given to you by your health care provider. Make sure you discuss any questions you have with your health care provider.

## 2014-03-26 NOTE — ED Notes (Signed)
Brought pt back to room; pt getting undressed and into a gown at this time; Antoine Poche, EMT present in room; Doroteo Bradford, [place] aware of pt

## 2014-03-30 ENCOUNTER — Ambulatory Visit (HOSPITAL_BASED_OUTPATIENT_CLINIC_OR_DEPARTMENT_OTHER): Payer: Medicaid Other | Admitting: Hematology

## 2014-03-30 ENCOUNTER — Ambulatory Visit (HOSPITAL_BASED_OUTPATIENT_CLINIC_OR_DEPARTMENT_OTHER): Payer: Medicaid Other

## 2014-03-30 ENCOUNTER — Other Ambulatory Visit: Payer: Self-pay | Admitting: Hematology and Oncology

## 2014-03-30 ENCOUNTER — Encounter: Payer: Self-pay | Admitting: Hematology

## 2014-03-30 VITALS — BP 126/77 | HR 68 | Temp 98.1°F | Resp 18 | Ht 66.0 in | Wt 152.4 lb

## 2014-03-30 DIAGNOSIS — C787 Secondary malignant neoplasm of liver and intrahepatic bile duct: Secondary | ICD-10-CM

## 2014-03-30 DIAGNOSIS — C7A019 Malignant carcinoid tumor of the small intestine, unspecified portion: Secondary | ICD-10-CM

## 2014-03-30 DIAGNOSIS — D0590 Unspecified type of carcinoma in situ of unspecified breast: Secondary | ICD-10-CM

## 2014-03-30 DIAGNOSIS — C50919 Malignant neoplasm of unspecified site of unspecified female breast: Secondary | ICD-10-CM

## 2014-03-30 DIAGNOSIS — C7B8 Other secondary neuroendocrine tumors: Secondary | ICD-10-CM

## 2014-03-30 DIAGNOSIS — D0512 Intraductal carcinoma in situ of left breast: Secondary | ICD-10-CM

## 2014-03-30 DIAGNOSIS — Z170 Estrogen receptor positive status [ER+]: Secondary | ICD-10-CM

## 2014-03-30 LAB — CBC WITH DIFFERENTIAL/PLATELET
BASO%: 0.1 % (ref 0.0–2.0)
Basophils Absolute: 0 10*3/uL (ref 0.0–0.1)
EOS%: 2.6 % (ref 0.0–7.0)
Eosinophils Absolute: 0.2 10*3/uL (ref 0.0–0.5)
HCT: 40 % (ref 34.8–46.6)
HGB: 13.4 g/dL (ref 11.6–15.9)
LYMPH%: 16.3 % (ref 14.0–49.7)
MCH: 29 pg (ref 25.1–34.0)
MCHC: 33.5 g/dL (ref 31.5–36.0)
MCV: 86.6 fL (ref 79.5–101.0)
MONO#: 0.4 10*3/uL (ref 0.1–0.9)
MONO%: 5.7 % (ref 0.0–14.0)
NEUT#: 5.3 10*3/uL (ref 1.5–6.5)
NEUT%: 75.3 % (ref 38.4–76.8)
Platelets: 208 10*3/uL (ref 145–400)
RBC: 4.62 10*6/uL (ref 3.70–5.45)
RDW: 13.6 % (ref 11.2–14.5)
WBC: 7 10*3/uL (ref 3.9–10.3)
lymph#: 1.1 10*3/uL (ref 0.9–3.3)

## 2014-03-30 LAB — COMPREHENSIVE METABOLIC PANEL (CC13)
ALBUMIN: 3.7 g/dL (ref 3.5–5.0)
ALK PHOS: 58 U/L (ref 40–150)
ALT: 14 U/L (ref 0–55)
AST: 18 U/L (ref 5–34)
Anion Gap: 9 mEq/L (ref 3–11)
BILIRUBIN TOTAL: 0.62 mg/dL (ref 0.20–1.20)
BUN: 9.6 mg/dL (ref 7.0–26.0)
CO2: 25 mEq/L (ref 22–29)
Calcium: 9.5 mg/dL (ref 8.4–10.4)
Chloride: 108 mEq/L (ref 98–109)
Creatinine: 0.8 mg/dL (ref 0.6–1.1)
Glucose: 132 mg/dl (ref 70–140)
POTASSIUM: 3.8 meq/L (ref 3.5–5.1)
Sodium: 142 mEq/L (ref 136–145)
Total Protein: 6.6 g/dL (ref 6.4–8.3)

## 2014-03-30 NOTE — Progress Notes (Signed)
[name] [id number] Feb 06, 1956 58 y.o. 03/30/2014 5:21 PM  CC  Chesley Noon, MD [address] [place] [address] Dr. Coralie Keens   Chief complaint: To follow up on her breast cancer and neuroendocrine tumor.  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 Laurelyn Sickle note  [name] is a 58 y.o. female.    #1 Patient noted a left breast mass she underwent ultrasound-guided biopsy on 12/06/2011 that showed a fibroadenoma. She had enlargement in the left breast that was associated with some discomfort. She went on to have an excision performed on 04/02/2013. The pathology revealed a fibroadenoma with extensive involvement with low-grade ductal carcinoma in situ. The tumor was 3.5 cm in dimension. The ductal carcinoma in situ extended to less than 0.1 cm from the inked margin. Patient had MRI of the breast performed post R. This demonstrated enlarging mass in the inferior left breast representing a seroma there was also a fibroadenoma within the lower inner quadrant of left breast adjacent to the seroma no malignancies in the right side no axillary adenopathy.She had another re-excision and SLN biopsy procedure on 06/04/2013 with Dr Ninfa Linden that showed a fibroadenoma with 0.15 cm area of DCIS, margins were negative and 1 SLN was negative.She did develop a hematoma post-op which was addressed surgically.  #2 status post radiation therapy to the left breast completed 09/05/2013.  #3 post radiation offered tamoxifen 20 mg daily starting January 2015 however patient states that she is not taking tamoxifen due to all of the side  effects in the literature. She had a prior TIA and she does not want Tamoxifen. Option of other Aromatase inhibitor drugs also discussed but patient very reluctant to try them  #4 Patient also has had a exploratory laparotomy after having small bowel obstruction. She had a resection performed 04/02/2013 that showed a 2 cm well-differentiated  Neuroendocrine tumor with 2 of 5 lymph nodes positive. T3 N1 tumor. Postoperatively she is doing well for that as well.  #5 MRI liver done. 10/30/2013 with a 12 mm hypervascular lesion concerning for metastasis. Further workup with PET scan and octreotide scan not suggestive of metastasis. Her carcinoid markers chromogranin and 24-hour urine 5-HIAA were also normal.  Current therapy:  Observation and Surveillance.  Interval history   Patient wanted to review with me the results of the PET scan as well as the octreotide scan and I did that. She is seen in the ER recently with some rectal bleeding which has subsided. She also some abdominal pain in the epigastric region raising a 3/10. She want to go back and see Dr. Oletta Lamas in gastroenterology. She also history of hepatitis C for which she had testing done about 2 years ago and apparently she was told that it has cleared itself and she does not need any therapy. This recommendation came from hepatitis Dr. in [place]. She also history of hyperlipidemia and a TIA about 3 years ago and she was told that she has a small aneurysm in her brain she continues to have some intermittent abdominal bloating and she is careful with your diet.   In terms of her smoking she is determined to  quit smoking as of 04/11/2014 history of her friends were also quitting at the same time and she'll try the patches for now   Past Medical History: Past Medical History  Diagnosis Date  . Hepatitis C   . Jaundice   . Breast disorder     lump in left breast  . TIA (transient ischemic attack) OCT 2012  . Hyperlipidemia   .  Varicose veins   . Adjustment disorder with mixed anxiety and depressed mood 01/05/2013  . Allergy   . Anxiety   . Arthritis     knees,wrists   . Pain management     goes to [facility], but she doesn't intend to return   . H/O echocardiogram     not finding report in EPIC, pt. reports that it was before her appt. /w Dr. Johnsie Cancel   . Stroke 2012    TIA  . Brain aneurysm 2012    2cms.  . Complication of anesthesia     hallucinations , anxiety   . Malignant neoplasm of lower-outer quadrant of female breast 05/19/2013  . Hx of radiation therapy 07/17/13-09/03/13    left breast,60.4Gy, 32/33 fx completed    Past Surgical History: Past Surgical History  Procedure Laterality Date  . Laparoscopy N/A 04/02/2013    Procedure: Diaganostic Lap.; Small Bowel Resection;  Surgeon: Harl Bowie, MD;  Location: [facility];  Service: General;  Laterality: N/A;  . Breast lumpectomy Left 04/02/2013    Procedure: Excision  of Left Breast Mass;  Surgeon: Harl Bowie, MD;  Location: [facility];  Service: General;  Laterality: Left;  . Colon surgery  03/2013    bowel obstruction , malignant tumor    . Vaginal births   74     birthed twins at -[redacted] weeks gestation - both survived , total of 3 pregnancies - all born vaginally  . Breast lumpectomy with sentinel lymph node biopsy Left 06/05/2013    Procedure: BREAST LUMPECTOMY WITH SENTINEL LYMPH NODE BX;  Surgeon: Harl Bowie, MD;  Location: [facility];  Service: General;  Laterality: Left;  . Evacuation breast hematoma Left 06/05/2013    Procedure: EVACUATION HEMATOMA BREAST;  Surgeon: Harl Bowie, MD;  Location: [facility];  Service: General;  Laterality: Left;    Family History: Family History  Problem Relation Age of Onset  . Diabetes Paternal Grandfather   . Diabetes Paternal Grandmother   . Cancer Father     lung  . Diabetes Mother   . Heart disease Mother   . Cancer Mother     cervical  . Diabetes Brother   . Stroke Brother   . Heart  disease Sister   . Diabetes Sister   . Cancer Sister     cervical    Social History History  Substance Use Topics  . Smoking status: Current Every Day Smoker -- 0.50 packs/day for 35 years  . Smokeless tobacco: Never Used     Comment: cut down to  10 cigarettes daily 06/25/13  . Alcohol Use: Yes     Comment: seldom- rare   Trying to quit smoking along with her 3 other friends.  Allergies: Allergies  Allergen Reactions  . Statins Swelling    Stomach swelling & muscle ache  . Ace Inhibitors Other (See Comments)    Other reaction(s): Palpitations  . Ciprofloxacin Hcl Palpitations  . Tramadol Other (See Comments) and Anxiety    Extreme muscle pain Extreme agitation Extreme muscle pain Extreme agitation  Current Medications: Current Outpatient Prescriptions  Medication Sig Dispense Refill  . ALPRAZolam (XANAX) 1 MG tablet Take 1 mg by mouth 3 (three) times daily.      Marland Kitchen docusate sodium (COLACE) 100 MG capsule Take 1 capsule (100 mg total) by mouth every 12 (twelve) hours.  60 capsule  0  . oxyCODONE-acetaminophen (PERCOCET) 5-325 MG per tablet Take 1 tablet by mouth every 4 (four) hours as needed.  20 tablet  0  . psyllium (METAMUCIL) 58.6 % packet Take 1 packet by mouth daily as needed (for constipation).      Marland Kitchen aspirin EC 81 MG tablet Take 162 mg by mouth at bedtime.        No current facility-administered medications for this visit.    OB/GYN History:menarche at age 7 she underwent menopause in 2001 she said 3 live births first live birth was at 71  Fertility Discussion: n/a Prior History of Cancer: no  Health Maintenance:  Colonoscopy 2014 Bone Density no Last PAP smear 2012  ECOG PERFORMANCE STATUS: 1 - Symptomatic but completely ambulatory  Genetic Counseling/testing: no  REVIEW OF SYSTEMS:  Constitutional: positive for fatigue and night sweats Ears, nose, mouth, throat, and face: negative Respiratory: negative Cardiovascular:  negative Gastrointestinal: positive for nausea and reflux symptoms Genitourinary:negative Integument/breast: positive for breast tenderness Hematologic/lymphatic: negative Musculoskeletal:positive for arthralgias Neurological: negative  PHYSICAL EXAMINATION: Blood pressure 126/77, pulse 68, temperature 98.1 F (36.7 C), temperature source Oral, resp. rate 18, height 5\' 6"  (1.676 m), weight 152 lb 6.4 oz (69.128 kg).  Patient is alert, healthy, no distress, well nourished, well developed and anxious SKIN: skin color, texture, turgor are normal HEAD: Normocephalic EYES: PERRLA, EOMI, Conjunctiva are pink and non-injected EARS: External ears normal OROPHARYNX:no exudate, no erythema and dentition normal  NECK: supple, no adenopathy, thyroid normal size, non-tender, without nodularity LYMPH:  no palpable lymphadenopathy, no hepatosplenomegaly, no cervical supraclavicular or axillary adenopathy BREAST:right breast normal without mass, skin or nipple changes or axillary nodes, surgical scars noted well-healed in the left breast LUNGS: clear to auscultation and percussion HEART: regular rate & rhythm ABDOMEN:abdomen soft, non-tender, normal bowel sounds, no masses or organomegaly and surgical scar well healing BACK: Back symmetric, no curvature., No CVA tenderness, Range of motion is normal EXTREMITIES:no edema, no clubbing, no cyanosis  NEURO: alert & oriented x 3 with fluent speech, no focal motor/sensory deficits, gait normal, reflexes normal and symmetric   STUDIES/RESULTS:   CT ABDOMEN AND PELVIS WITH CONTRAST ON 03/26/2014 TECHNIQUE: Multidetector CT imaging of the abdomen and pelvis was performed  using the standard protocol following bolus administration of intravenous contrast. CONTRAST: 167mL OMNIPAQUE IOHEXOL 300 MG/ML SOLN  COMPARISON: CT scan dated 03/13/2014  CLINICAL DATA: Abdominal pain and swelling. Blood in the stool. Neuroendocrine tumor in the bowel resected in 2014.   EXAM: FINDINGS:  The dilatation of small bowel seen on the prior study has completely resolved. The site of the enteroenteric anastomosis in the mid pelvis was the site of obstruction but canal contrast is passing through the anastomosis. There is slight prominence of the soft tissues at the site of the anastomosis which most likely represents postsurgical scarring. I cannot exclude a tiny area of recurrent tumor. There was a small focus of increased activity on PET-CT at that site on 11/26/2013. The liver, biliary tree, spleen, pancreas, adrenal glands, and kidneys are normal. No free air or free fluid. Appendix and terminal ileum were normal. Uterus and ovaries and bladder are normal. No osseous abnormality.  IMPRESSION:  Complete resolution of the small bowel obstruction seen on the prior  study. There is slight prominence of soft tissues at the  enteroenteric anastomosis most likely represents scarring but I  cannot exclude a small site of recurrent tumor.          CT ABDOMEN AND PELVIS WITH CONTRAST 03/13/2014 TECHNIQUE: Multidetector CT imaging of the abdomen and pelvis was performed using the standard protocol following bolus administration of intravenous contrast. CONTRAST: 158mL OMNIPAQUE IOHEXOL 300 MG/ML SOLN COMPARISON: PET-CT 11/26/2013, MR abdomen 10/30/2013, CT abdomen  03/31/2013  FINDINGS:  The lung bases are clear. The liver demonstrates no focal abnormality. There is no intrahepatic or extrahepatic biliary ductal dilatation. The gallbladder is normal. The spleen demonstrates no focal abnormality. The kidneys, adrenal glands and pancreas are normal. The bladder is unremarkable. There is small bowel dilatation involving the jejunum with a transition zone in the mid lower abdomen at the site of prior small bowel resection and anastomosis. There is fecal material proximal to the anastomotic suture line. There is a small amount of fluid and inflammation surrounding the bowel just  proximal to the anastomotic  suture line. The bowel distal to the anastomosis is relatively decompressed. The colon is decompressed. There is a normal caliber appendix in the right lower quadrant without periappendiceal inflammatory changes. There is no pneumoperitoneum, pneumatosis, or portal venous gas. There is no abdominal or pelvic free fluid. There is no lymphadenopathy. The abdominal aorta is normal in caliber with atherosclerosis. There are no lytic or sclerotic osseous lesions.  IMPRESSION:  Small bowel obstruction with a transition zone in the mid lower abdomen at the site of an enteroenteric anastomosis.       EXAM:  NUCLEAR MEDICINE OCTREOTIDE (SOMATOSTATIN-RECEPTOR) SCAN 01/05/2014 TECHNIQUE: Following intravenous administration of radiopharmaceutical, whole body images of the head, neck, trunk, and extremities were obtained  on subsequent days. RADIOPHARMACEUTICALS: 6.5 mCiIn-111 Octreotide COMPARISON: PET-CT scan 11/26/2013, MRI 10/30/2013  FINDINGS:  There is no abnormal uptake within the liver to correspond to the lesion on CT and MR imaging. Lesion was hypometabolic on FDG imaging. No evidence of metastasis on the whole-body scan. Physiologic activity noted within the liver, kidneys, spleen, and GI and GU tract.  IMPRESSION:  1. No evidence of neuroendocrine tumor within the liver. 2. No evidence of metastasis on the whole-body scan    EXAM:  NUCLEAR MEDICINE PET SKULL BASE TO THIGH 11/18/2013 TECHNIQUE:  7.9 mCi F-18 FDG was injected intravenously. Full-ring PET imaging  was performed from the skull base to thigh after the radiotracer. CT  data was obtained and used for attenuation correction and anatomic  localization.  IMPRESSION:  1. No metabolic activity associated with the small liver lesion.  Some neuroendocrine tumors can have low metabolic activity therefore  this lesion remains indeterminate.  2. Mild activity at the enteric enteric anastomosis within the  lower  abdomen with differential includes postsurgical inflammation versus  residual tumor. Recommend attention on follow-up.             LABS:    Chemistry      Component Value Date/Time   NA 142 03/30/2014 0950   NA 141 03/26/2014 1005   K 3.8 03/30/2014 0950   K 4.1 03/26/2014 1005   CL 104 03/26/2014 1005   CO2 25 03/30/2014 0950   CO2 22 03/26/2014 1005   BUN 9.6 03/30/2014 0950   BUN 9 03/26/2014 1005   CREATININE 0.8 03/30/2014 0950   CREATININE 0.65 03/26/2014 1005  Component Value Date/Time   CALCIUM 9.5 03/30/2014 0950   CALCIUM 9.1 03/26/2014 1005   ALKPHOS 58 03/30/2014 0950   ALKPHOS 62 03/26/2014 1005   AST 18 03/30/2014 0950   AST 15 03/26/2014 1005   ALT 14 03/30/2014 0950   ALT 14 03/26/2014 1005   BILITOT 0.62 03/30/2014 0950   BILITOT 0.4 03/26/2014 1005      Lab Results  Component Value Date   WBC 7.0 03/30/2014   HGB 13.4 03/30/2014   HCT 40.0 03/30/2014   MCV 86.6 03/30/2014   PLT 208 03/30/2014   PATHOLOGY: ADDITIONAL INFORMATION: 1. PROGNOSTIC INDICATORS - ACIS Results: IMMUNOHISTOCHEMICAL AND MORPHOMETRIC ANALYSIS BY THE AUTOMATED CELLULAR IMAGING SYSTEM (ACIS) Estrogen Receptor: 100%, POSITIVE, STRONG STAINING INTENSITY Progesterone Receptor: 100%, POSITIVE, STRONG STAINING INTENSITY FINAL DIAGNOSIS Diagnosis 1. Breast, excision, Left - FIBROADENOMA WITH EXTENSIVE INVOLVEMENT WITH LOW GRADE CARCINOMA IN SITU. - ESTIMATED TUMOR SIZE IS 3.5 CM IN GREATEST DIMENSION. - LOW GRADE DUCTAL CARCINOMA IN SITU LESS THAN 0.1 CM FROM INKED MARGIN. - SEE ONCOLOGY TEMPLATE. 2. Small intestine, resection, Small - INVASIVE WELL DIFFERENTIATED NEUROENDOCRINE TUMOR (CARCINOID), SPANNING 2 CM IN GREATEST 1 of 3 Duplicate copy FINAL for [name], [name] [phone]) Diagnosis(continued) DIMENSION. - TUMOR INVADES THROUGH MUSCULARIS PROPRIA INTO SUBSEROSAL TISSUES. - MARGINS ARE NEGATIVE. - TWO OF FIVE LYMPH NODES ARE POSITIVE FOR METASTATIC WELL  DIFFERENTIATED NEUROENDOCRINE TUMOR (2/5).  Microscopic Comment 1. BREAST, IN SITU CARCINOMA Specimen, including laterality: Left partial breast. Procedure: Left breast excision. Grade of carcinoma: Low grade. Necrosis: No. Estimated tumor size: (based on gross measurement): 3.5 cm. Treatment effect: N/A. Distance to closest margin: Less than 0.1 cm from unoriented inked margin. Breast prognostic profile: Not previously performed, will be performed on current case. Lymph nodes: Examined: 0. TNM: pTis, pNX. Comments: A Cytokeratin 5/6 immunohistochemical stain is performed on a representative block. The areas of epithelial proliferation fail to stain for cytokeratin 5/6. The morphology coupled with the staining pattern is consistent with the above diagnosis. A quantitative estrogen receptor and progesterone receptor will be performed on a representative block and reported in an addendum. Dr. Lyndon Code has seen the first specimen in consultation with agreement that the findings represent a fibroadenoma with low grade ductal carcinoma in situ. (RAH:caf 04/04/13)  2. SMALL INTESTINE NEUROENDOCRINE TUMOR Histologic Type: Well differentiated neuroendocrine tumor (carcinoid). Histologic Grade: Well differentiated/low grade. Microscopic Tumor Extension: Tumor extends through muscularis propria into subserosal tissues. Resection Margins: All margins are negative including proximal and distal margins. Lymph-Vascular invasion: Definitive lymph-vascular invasion is not identified; however, lymph nodes are positive, see below. Perineural Invasion: Not identified. Lymph nodes: 2 of 5 lymph nodes positive for metastatic tumor. TNM code: pT3, pN1, MX. Additional Pathologic Findings: No additional significant pathologic abnormalities. Ancillary Studies: Can be performed upon request. Clinical History: Small bowel obstruction with small bowel mass. Comments: Immunohistochemical stains are performed. The  tumor is positive for cytokeratin AE1/AE3, chromogranin, synaptophysin, and CD56. The morphology coupled with the staining pattern is consistent with the above diagnosis. Dr. Lyndon Code is in agreement that the tumor extends through muscularis propria (pT3). 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 Patient is status post lumpectomy. She is getting her surveillance  mammogram on 04/03/2014 at breast center.  #2  she completed  radiation therapy on 09/05/2013.   #3 She declined adjuvant tamoxifen therapy AND also refused treatment  with Arimidex/Aromasin (AI's) which I discussed with her today.  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   #5 PET scan performed performed in March 2015 revealed hypodensity in the liver with no metabolic activity and mild metabolic activity noted in enteric  Anastomosis site, Octreotide scan however was negative.  #7 Serum chromogranin A level and 24 hr  urine 5 HIAA and also octreotide scan performed to assess for metastasis from neuroendocrine tumor came back negative.  #8 RTC 6 months with labs and a pet/ct scan to be performed prior to visit.   #9 GI consult with Dr Oletta Lamas to follow up on carcinoid and for recent onset of symptoms such as abdominal pain, rectal bleeding, ?recurrence noted on ct scan and pet, negative octreotide scan and hx of hepatitis C.   All questions were answered. The patient knows to call the clinic with any problems, questions or concerns. We can certainly see the patient much sooner if necessary.  I spent 30 minutes counseling the patient face to face. The total time spent in the appointment was 40-45 minutes.    Bernadene Bell, MD Medical Hematologist/Oncologist [place] Pager:  [phone] Office No: [phone]

## 2014-04-01 ENCOUNTER — Telehealth: Payer: Self-pay | Admitting: Hematology

## 2014-04-01 NOTE — Telephone Encounter (Signed)
s.w. pt and advised on Jan appt.....pt ok and aware °

## 2014-04-03 ENCOUNTER — Ambulatory Visit
Admission: RE | Admit: 2014-04-03 | Discharge: 2014-04-03 | Disposition: A | Discharge status: Home / Self Care | Payer: Medicaid Other | Source: Ambulatory Visit | Attending: Hematology and Oncology | Admitting: Hematology and Oncology

## 2014-04-03 DIAGNOSIS — C50919 Malignant neoplasm of unspecified site of unspecified female breast: Secondary | ICD-10-CM

## 2014-07-13 ENCOUNTER — Encounter: Payer: Self-pay | Admitting: Hematology

## 2014-07-31 ENCOUNTER — Observation Stay (HOSPITAL_COMMUNITY)
Admission: EM | Admit: 2014-07-31 | Discharge: 2014-08-01 | Disposition: A | Discharge status: Home / Self Care | Payer: Medicaid Other | Attending: Internal Medicine | Admitting: Internal Medicine

## 2014-07-31 ENCOUNTER — Encounter (HOSPITAL_COMMUNITY): Payer: Self-pay | Admitting: Nurse Practitioner

## 2014-07-31 ENCOUNTER — Emergency Department (HOSPITAL_COMMUNITY): Payer: Medicaid Other

## 2014-07-31 DIAGNOSIS — Z8673 Personal history of transient ischemic attack (TIA), and cerebral infarction without residual deficits: Secondary | ICD-10-CM | POA: Diagnosis not present

## 2014-07-31 DIAGNOSIS — N649 Disorder of breast, unspecified: Secondary | ICD-10-CM | POA: Insufficient documentation

## 2014-07-31 DIAGNOSIS — R110 Nausea: Secondary | ICD-10-CM | POA: Diagnosis not present

## 2014-07-31 DIAGNOSIS — Z7982 Long term (current) use of aspirin: Secondary | ICD-10-CM | POA: Diagnosis not present

## 2014-07-31 DIAGNOSIS — Z79899 Other long term (current) drug therapy: Secondary | ICD-10-CM | POA: Diagnosis not present

## 2014-07-31 DIAGNOSIS — F329 Major depressive disorder, single episode, unspecified: Secondary | ICD-10-CM | POA: Insufficient documentation

## 2014-07-31 DIAGNOSIS — M1990 Unspecified osteoarthritis, unspecified site: Secondary | ICD-10-CM | POA: Insufficient documentation

## 2014-07-31 DIAGNOSIS — Z8701 Personal history of pneumonia (recurrent): Secondary | ICD-10-CM | POA: Insufficient documentation

## 2014-07-31 DIAGNOSIS — Z8619 Personal history of other infectious and parasitic diseases: Secondary | ICD-10-CM | POA: Insufficient documentation

## 2014-07-31 DIAGNOSIS — Z720 Tobacco use: Secondary | ICD-10-CM | POA: Insufficient documentation

## 2014-07-31 DIAGNOSIS — I10 Essential (primary) hypertension: Secondary | ICD-10-CM | POA: Diagnosis not present

## 2014-07-31 DIAGNOSIS — F419 Anxiety disorder, unspecified: Secondary | ICD-10-CM

## 2014-07-31 DIAGNOSIS — Z853 Personal history of malignant neoplasm of breast: Secondary | ICD-10-CM | POA: Insufficient documentation

## 2014-07-31 DIAGNOSIS — I671 Cerebral aneurysm, nonruptured: Secondary | ICD-10-CM | POA: Insufficient documentation

## 2014-07-31 DIAGNOSIS — I868 Varicose veins of other specified sites: Secondary | ICD-10-CM | POA: Insufficient documentation

## 2014-07-31 DIAGNOSIS — F4323 Adjustment disorder with mixed anxiety and depressed mood: Secondary | ICD-10-CM | POA: Diagnosis not present

## 2014-07-31 DIAGNOSIS — E785 Hyperlipidemia, unspecified: Secondary | ICD-10-CM | POA: Diagnosis not present

## 2014-07-31 DIAGNOSIS — R079 Chest pain, unspecified: Secondary | ICD-10-CM | POA: Diagnosis present

## 2014-07-31 HISTORY — DX: Major depressive disorder, single episode, unspecified: F32.9

## 2014-07-31 HISTORY — DX: Essential (primary) hypertension: I10

## 2014-07-31 HISTORY — DX: Pneumonia, unspecified organism: J18.9

## 2014-07-31 HISTORY — DX: Depression, unspecified: F32.A

## 2014-07-31 HISTORY — DX: Unspecified viral hepatitis B without hepatic coma: B19.10

## 2014-07-31 LAB — CBC
HEMATOCRIT: 38.5 % (ref 36.0–46.0)
Hemoglobin: 12.8 g/dL (ref 12.0–15.0)
MCH: 28.6 pg (ref 26.0–34.0)
MCHC: 33.2 g/dL (ref 30.0–36.0)
MCV: 86.1 fL (ref 78.0–100.0)
PLATELETS: 224 10*3/uL (ref 150–400)
RBC: 4.47 MIL/uL (ref 3.87–5.11)
RDW: 13.6 % (ref 11.5–15.5)
WBC: 5.6 10*3/uL (ref 4.0–10.5)

## 2014-07-31 LAB — BASIC METABOLIC PANEL
Anion gap: 14 (ref 5–15)
BUN: 19 mg/dL (ref 6–23)
CALCIUM: 8.6 mg/dL (ref 8.4–10.5)
CO2: 22 meq/L (ref 19–32)
Chloride: 107 mEq/L (ref 96–112)
Creatinine, Ser: 0.76 mg/dL (ref 0.50–1.10)
GFR calc Af Amer: >90 mL/min (ref >90)
GLUCOSE: 85 mg/dL (ref 70–99)
Potassium: 4.4 mEq/L (ref 3.7–5.3)
Sodium: 143 mEq/L (ref 137–147)

## 2014-07-31 LAB — I-STAT TROPONIN, ED: Troponin i, poc: 0 ng/mL (ref 0.00–0.08)

## 2014-07-31 LAB — TROPONIN I: Troponin I: <0.3 ng/mL (ref <0.30)

## 2014-07-31 LAB — D-DIMER, QUANTITATIVE: D-Dimer, Quant: 0.32 ug/mL-FEU (ref 0.00–0.48)

## 2014-07-31 MED ORDER — ACETAMINOPHEN 325 MG PO TABS: 650.0000 mg | ORAL_TABLET | ORAL | Status: DC | PRN

## 2014-07-31 MED ORDER — ASPIRIN EC 81 MG PO TBEC: 162.0000 mg | DELAYED_RELEASE_TABLET | Freq: Every day | ORAL | Status: DC

## 2014-07-31 MED ORDER — ONDANSETRON 4 MG PO TBDP
8.0000 mg | ORAL_TABLET | Freq: Once | ORAL | Status: AC
  Administered 2014-07-31: 8 mg via ORAL
  Filled 2014-07-31: qty 2

## 2014-07-31 MED ORDER — OXYCODONE-ACETAMINOPHEN 5-325 MG PO TABS
2.0000 | ORAL_TABLET | Freq: Once | ORAL | Status: AC
  Administered 2014-07-31: 2 via ORAL
  Filled 2014-07-31: qty 2

## 2014-07-31 MED ORDER — ONDANSETRON HCL 4 MG/2ML IJ SOLN: 4.0000 mg | Freq: Four times a day (QID) | INTRAMUSCULAR | Status: DC | PRN

## 2014-07-31 MED ORDER — NITROGLYCERIN 0.4 MG SL SUBL: 0.4000 mg | SUBLINGUAL_TABLET | SUBLINGUAL | Status: DC | PRN

## 2014-07-31 MED ORDER — ALPRAZOLAM 0.5 MG PO TABS
1.0000 mg | ORAL_TABLET | Freq: Three times a day (TID) | ORAL | Status: DC
  Administered 2014-07-31 – 2014-08-01 (×2): 1 mg via ORAL
  Filled 2014-07-31 (×2): qty 2

## 2014-07-31 MED ORDER — ENOXAPARIN SODIUM 40 MG/0.4ML ~~LOC~~ SOLN
40.0000 mg | SUBCUTANEOUS | Status: DC
  Filled 2014-07-31: qty 0.4

## 2014-07-31 NOTE — ED Notes (Signed)
PA [place] at bedside.

## 2014-07-31 NOTE — ED Notes (Signed)
Pt given [place] sandwich and ginger ale with PA [employer].

## 2014-07-31 NOTE — ED Provider Notes (Signed)
CSN: [id number]     Arrival date & time 07/31/14  1130 History   First MD Initiated Contact with Patient 07/31/14 1149     Chief Complaint  Patient presents with  . Chest Pain     (Consider location/radiation/quality/duration/timing/severity/associated sxs/prior Treatment) HPI Comments: Patient is a 58 year old female history of breast cancer, hepatitis C, TIA, hyperlipidemia, varicose pains, adjustment disorder, brain aneurysm who presents the emergency department today for evaluation of chest pain. She reports that the pain began around 9 AM. The pain is sharp and midsternal. The pain lasted for approximately one hour and was associated with shortness of breath and lightheadedness. She also complains of pain in her left side. This pain is more of a dull, soreness. It has been intermittent over the past week. The pain is worse when she lifts her left arm. She states the pain is not pleuritic. No leg swelling, recent surgeries. No fevers, chills. She notes that she has been under increased stress recently. She drank 3 glasses of wine last night which is abnormal for her.  The history is provided by the patient. No language interpreter was used.    Past Medical History  Diagnosis Date  . Hepatitis C   . Jaundice   . Breast disorder     lump in left breast  . TIA (transient ischemic attack) OCT 2012  . Hyperlipidemia   . Varicose veins   . Adjustment disorder with mixed anxiety and depressed mood 01/05/2013  . Allergy   . Anxiety   . Arthritis     knees,wrists   . Pain management     goes to [facility] clinic, but she doesn't intend to return   . H/O echocardiogram     not finding report in EPIC, pt. reports that it was before her appt. /w Dr. Johnsie Cancel   . Stroke 2012    TIA  . Brain aneurysm 2012    2cms.  . Complication of anesthesia     hallucinations , anxiety   . Malignant neoplasm of lower-outer quadrant of female breast 05/19/2013  . Hx of radiation therapy 07/17/13-09/03/13   left breast,60.4Gy, 32/33 fx completed   Past Surgical History  Procedure Laterality Date  . Laparoscopy N/A 04/02/2013    Procedure: Diaganostic Lap.; Small Bowel Resection;  Surgeon: Harl Bowie, MD;  Location: [place];  Service: General;  Laterality: N/A;  . Breast lumpectomy Left 04/02/2013    Procedure: Excision  of Left Breast Mass;  Surgeon: Harl Bowie, MD;  Location: [place];  Service: General;  Laterality: Left;  . Colon surgery  03/2013    bowel obstruction , malignant tumor    . Vaginal births   67     birthed twins at -[redacted] weeks gestation - both survived , total of 3 pregnancies - all born vaginally  . Breast lumpectomy with sentinel lymph node biopsy Left 06/05/2013    Procedure: BREAST LUMPECTOMY WITH SENTINEL LYMPH NODE BX;  Surgeon: Harl Bowie, MD;  Location: [place];  Service: General;  Laterality: Left;  . Evacuation breast hematoma Left 06/05/2013    Procedure: EVACUATION HEMATOMA BREAST;  Surgeon: Harl Bowie, MD;  Location: [place];  Service: General;  Laterality: Left;   Family History  Problem Relation Age of Onset  . Diabetes Paternal Grandfather   . Diabetes Paternal Grandmother   . Cancer Father     lung  . Diabetes Mother   . Heart disease Mother   . Cancer Mother  cervical  . Diabetes Brother   . Stroke Brother   . Heart disease Sister   . Diabetes Sister   . Cancer Sister     cervical   History  Substance Use Topics  . Smoking status: Current Every Day Smoker -- 0.50 packs/day for 35 years  . Smokeless tobacco: Never Used     Comment: cut down to  10 cigarettes daily 06/25/13  . Alcohol Use: Yes     Comment: seldom- rare   OB History    Gravida Para Term Preterm AB TAB SAB Ectopic Multiple Living   5 4  2 1 1    4      Review of Systems  Constitutional: Negative for fever and chills.  Respiratory: Positive for chest tightness and shortness of breath.   Cardiovascular: Positive for chest pain. Negative for leg swelling.   Gastrointestinal: Positive for nausea. Negative for vomiting and abdominal pain.  Neurological: Positive for light-headedness.  All other systems reviewed and are negative.     Allergies  Statins; Ace inhibitors; Ciprofloxacin hcl; and Tramadol  Home Medications   Prior to Admission medications   Medication Sig Start Date End Date Taking? Authorizing Provider  ALPRAZolam Duanne Moron) 1 MG tablet Take 1 mg by mouth 3 (three) times daily. 07/31/13  Yes Historical Provider, MD  aspirin EC 81 MG tablet Take 162 mg by mouth at bedtime.    Yes Historical Provider, MD  naproxen sodium (ANAPROX) 220 MG tablet Take 440 mg by mouth 2 (two) times daily as needed (for pain).   Yes Historical Provider, MD  docusate sodium (COLACE) 100 MG capsule Take 1 capsule (100 mg total) by mouth every 12 (twelve) hours. Patient not taking: Reported on 07/31/2014 03/13/14   Pamella Pert, MD  oxyCODONE-acetaminophen (PERCOCET) 5-325 MG per tablet Take 1 tablet by mouth every 4 (four) hours as needed. Patient not taking: Reported on 07/31/2014 03/13/14   Pamella Pert, MD   BP 125/88 mmHg  Pulse 68  Temp(Src) 98 F (36.7 C) (Oral)  Resp 14  SpO2 96% Physical Exam  Constitutional: She is oriented to person, place, and time. She appears well-developed and well-nourished. No distress.  HENT:  Head: Normocephalic and atraumatic.  Right Ear: External ear normal.  Left Ear: External ear normal.  Nose: Nose normal.  Mouth/Throat: Oropharynx is clear and moist.  Eyes: Conjunctivae are normal.  Neck: Normal range of motion.  Cardiovascular: Normal rate, regular rhythm, normal heart sounds, intact distal pulses and normal pulses.   Pulses:      Radial pulses are 2+ on the right side, and 2+ on the left side.       Posterior tibial pulses are 2+ on the right side, and 2+ on the left side.  Pulmonary/Chest: Effort normal and breath sounds normal. No stridor. No respiratory distress. She has no wheezes. She has no  rales.    No erythema, warmth  Abdominal: Soft. She exhibits no distension.  Musculoskeletal: Normal range of motion.  Neurological: She is alert and oriented to person, place, and time. She has normal strength.  Skin: Skin is warm and dry. She is not diaphoretic. No erythema.  Psychiatric: She has a normal mood and affect. Her behavior is normal.  Nursing note and vitals reviewed.   ED Course  Procedures (including critical care time) Labs Review Labs Reviewed  BASIC METABOLIC PANEL  CBC  D-DIMER, QUANTITATIVE  Randolm Idol, ED    Imaging Review Dg Chest 2 View  07/31/2014  CLINICAL DATA:  58 year old female with a history of chest pain and dizziness for 1 month.  EXAM: CHEST - 2 VIEW  COMPARISON:  Chest x-ray 04/02/2013, PET-CT 11/26/2013  FINDINGS: Cardiomediastinal silhouette within normal limits in size and contour.  No confluent airspace disease, pleural effusion, or pneumothorax.  Increased AP diameter, retrosternal airspace, and associated flattening of the hemidiaphragms. Coarsened interstitial markings.  No displaced fracture.  Unremarkable appearance of the upper abdomen.  IMPRESSION: No radiographic evidence of acute cardiopulmonary disease.  Changes of emphysema, with coarsened interstitial markings.  Signed,  Dulcy Fanny. Earleen Newport, DO  [facility]  [facility]   Electronically Signed   By: Corrie Mckusick D.O.   On: 07/31/2014 12:35     EKG Interpretation   Date/Time:  Friday July 31 2014 11:34:30 EST Ventricular Rate:  80 PR Interval:  146 QRS Duration: 82 QT Interval:  364 QTC Calculation: 419 R Axis:   46 Text Interpretation:  Normal sinus rhythm Normal ECG No significant change  was found Confirmed by Wyvonnia Dusky  MD, STEPHEN ([id number]) on 07/31/2014 11:52:54  AM      MDM   Final diagnoses:  Chest pain    Concern for cardiac etiology of Chest Pain. Pt does not meet criteria for CP protocol and a further  evaluation is recommended. Pt has been re-evaluated prior to consult and VSS, NAD, heart RRR, pain 0/10, lungs CTAB. No acute abnormalities found on EKG and first round of cardiac enzymes negative. This case was discussed with Dr. Wyvonnia Dusky who has seen the patient and agrees with plan to admit.      Elwyn Lade, PA-C 07/31/14 [place], MD 08/02/14 [phone]

## 2014-07-31 NOTE — ED Notes (Signed)
Pt c/o midsternal CP onset this am. She took 81mg  aspirin, tramadol and xanax with no relief. She reports increased stress level at home this week. She has also noticed pain under L breast over past several days, describes as "soreness" and reports the location is near where she had a lumpectomy for breast cancer last year.

## 2014-07-31 NOTE — H&P (Signed)
Date: 07/31/2014               Patient Name:  [name] MRN: [id number]  DOB: 24-Jun-1956 Age / Sex: 58 y.o., female   PCP: Chesley Noon, MD         Medical Service: Internal Medicine Teaching Service         Attending Physician: Dr. Aldine Contes, MD    First Contact: Dr. Marvel Plan Pager: [phone]  Second Contact: Dr. Ronnald Ramp Pager: [phone]       After Hours (After 5p/  First Contact Pager: [phone]  weekends / holidays): Second Contact Pager: [phone]   Chief Complaint: chest pain  History of Present Illness: Ms. 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 Patient states she was walking around her yard this morning when she had a sudden onset of chest pain located in her inferior substernum. Pain was sharp in nature, like a stabbing pain without radiation and last for less than a minute. Pain was not associated with nausea, vomiting, shortness of breath or diaphoresis. Patient took aspirin and xanax with minimal relief. However, pain only lasted under a minutes and then remained as a nagging uncomfortable sensation. Currently, she has no pain. Pain is reproducible with palpation of sternum. Patient has had increasing shortness of breath with exertion over the past two months. She denies any shortness of breath when laying flat or any chest pain with inspiration. She denies any swelling in her legs. Patient has never had a stress test. She has had chest pain in the past that was associated with anxiety attacks, but she states this pain is not similar to prior anxiety attacks. Patient does admit to increasing stress and anxiety revolved around her neighbor and trying to obtain custody of her grandchildren. She also admits to tobacco use-1/2 to 1 ppd for 40 years. Patient states she is trying to quit.   Meds: Medications  Prior to Admission  Medication Sig Dispense Refill  . ALPRAZolam (XANAX) 1 MG tablet Take 1 mg by mouth 3 (three) times daily.    Marland Kitchen aspirin EC 81 MG tablet Take 162 mg by mouth at bedtime.     . naproxen sodium (ANAPROX) 220 MG tablet Take 440 mg by mouth 2 (two) times daily as needed (for pain).    Marland Kitchen docusate sodium (COLACE) 100 MG capsule Take 1 capsule (100 mg total) by mouth every 12 (twelve) hours. (Patient not taking: Reported on 07/31/2014) 60 capsule 0  . oxyCODONE-acetaminophen (PERCOCET) 5-325 MG per tablet Take 1 tablet by mouth every 4 (four) hours as needed. (Patient not taking: Reported on 07/31/2014) 20 tablet 0    Current Facility-Administered Medications  Medication Dose Route Frequency Provider Last Rate Last Dose  . acetaminophen (TYLENOL) tablet 650 mg  650 mg Oral Q4H PRN Corky Sox, MD      . ALPRAZolam Duanne Moron) tablet 1 mg  1 mg Oral TID Osa Craver, MD   1 mg at 07/31/14 1841  . [START ON 08/01/2014] aspirin EC tablet 162 mg  162 mg Oral QHS Corky Sox, MD      . enoxaparin (LOVENOX) injection 40 mg  40 mg Subcutaneous Q24H Corky Sox, MD      . nitroGLYCERIN (NITROSTAT) SL tablet 0.4 mg  0.4 mg Sublingual Q5 Min x  3 PRN Corky Sox, MD      . ondansetron [facility]) injection 4 mg  4 mg Intravenous Q6H PRN Corky Sox, MD        Allergies: Allergies as of 07/31/2014 - Review Complete 07/31/2014  Allergen Reaction Noted  . Statins Swelling 03/31/2013  . Ace inhibitors Other (See Comments) 05/19/2013  . Ciprofloxacin hcl Palpitations 03/24/2012  . Tramadol Other (See Comments) and Anxiety 10/06/2013   Past Medical History  Diagnosis Date  . Hepatitis C   . Jaundice   . Breast disorder     lump in left breast  . TIA (transient ischemic attack) OCT 2012  . Hyperlipidemia   . Varicose veins   . Adjustment disorder with mixed anxiety and depressed mood 01/05/2013  . Allergy   . Anxiety   . Arthritis     knees,wrists   . Pain management     goes to  [facility] clinic, but she doesn't intend to return   . H/O echocardiogram     not finding report in EPIC, pt. reports that it was before her appt. /w Dr. Johnsie Cancel   . Stroke 2012    TIA  . Brain aneurysm 2012    2cms.  . Complication of anesthesia     hallucinations , anxiety   . Malignant neoplasm of lower-outer quadrant of female breast 05/19/2013  . Hx of radiation therapy 07/17/13-09/03/13    left breast,60.4Gy, 32/33 fx completed   Past Surgical History  Procedure Laterality Date  . Laparoscopy N/A 04/02/2013    Procedure: Diaganostic Lap.; Small Bowel Resection;  Surgeon: Harl Bowie, MD;  Location: [place];  Service: General;  Laterality: N/A;  . Breast lumpectomy Left 04/02/2013    Procedure: Excision  of Left Breast Mass;  Surgeon: Harl Bowie, MD;  Location: [place];  Service: General;  Laterality: Left;  . Colon surgery  03/2013    bowel obstruction , malignant tumor    . Vaginal births   5     birthed twins at -[redacted] weeks gestation - both survived , total of 3 pregnancies - all born vaginally  . Breast lumpectomy with sentinel lymph node biopsy Left 06/05/2013    Procedure: BREAST LUMPECTOMY WITH SENTINEL LYMPH NODE BX;  Surgeon: Harl Bowie, MD;  Location: [place];  Service: General;  Laterality: Left;  . Evacuation breast hematoma Left 06/05/2013    Procedure: EVACUATION HEMATOMA BREAST;  Surgeon: Harl Bowie, MD;  Location: [place];  Service: General;  Laterality: Left;   Family History  Problem Relation Age of Onset  . Diabetes Paternal Grandfather   . Diabetes Paternal Grandmother   . Cancer Father     lung  . Diabetes Mother   . Heart disease Mother   . Cancer Mother     cervical  . Diabetes Brother   . Stroke Brother   . Heart disease Sister   . Diabetes Sister   . Cancer Sister     cervical   History   Social History  . Marital Status: Legally Separated    Spouse Name: N/A    Number of Children: 3  . Years of Education: N/A    Occupational History  . disabled    Social History Main Topics  . Smoking status: Current Every Day Smoker -- 0.50 packs/day for 35 years  . Smokeless tobacco: Never Used     Comment: cut down to  10 cigarettes daily 06/25/13  . Alcohol Use: Yes  Comment: seldom- rare  . Drug Use: No  . Sexual Activity: No   Other Topics Concern  . Not on file   Social History Narrative    Review of Systems: General: Denies fever, chills, fatigue, change in appetite and diaphoresis.  Respiratory: Admits to DOE. Denies SOB, cough, chest tightness, and wheezing.   Cardiovascular: Admits to chest pain and prior history of palpitations.  Gastrointestinal: Denies nausea, vomiting, abdominal pain, diarrhea, constipation, blood in stool and abdominal distention.  Genitourinary: Denies dysuria, urgency, frequency, hematuria, suprapubic pain and flank pain. Endocrine: Denies hot or cold intolerance, polyuria, and polydipsia. Musculoskeletal: Denies myalgias, back pain, joint swelling, arthralgias and gait problem.  Skin: Denies pallor, rash and wounds.  Neurological: Denies dizziness, headaches, weakness, lightheadedness, numbness,seizures, and syncope, Psychiatric/Behavioral: Admits to stress and anxiety.  Physical Exam: Filed Vitals:   07/31/14 1415 07/31/14 1500 07/31/14 1545 07/31/14 1649  BP: 140/85 139/100 111/85 134/71  Pulse: 70 61 57 64  Temp:    98.4 F (36.9 C)  TempSrc:    Oral  Resp: 22 13 15 16   Height:    5\' 6"  (1.676 m)  SpO2: 97% 97% 98% 99%   General: Vital signs reviewed.  Patient is well-developed and well-nourished, in no acute distress and cooperative with exam.  Neck: Supple, trachea midline, no carotid bruit present.  Cardiovascular: RRR, S1 normal, S2 normal, no murmurs, gallops, or rubs. Pulmonary/Chest: Clear to auscultation bilaterally, no wheezes, rales, or rhonchi. Tender on palpation of right axilla and chest wall. No lymphadenopathy. L breast s/p  lumpectomy. Abdominal: Soft, non-tender, non-distended, BS +, no masses, organomegaly, or guarding present.  Musculoskeletal: No joint deformities, erythema, or stiffness, ROM full and nontender. Extremities: No lower extremity edema bilaterally,  pulses symmetric and intact bilaterally. No cyanosis or clubbing.  Skin: Warm, dry and intact. No rashes or erythema. Psychiatric: Normal mood and affect. speech and behavior is normal. Cognition and memory are normal.    Lab results: Basic Metabolic Panel:  Recent Labs  07/31/14 1200  NA 143  K 4.4  CL 107  CO2 22  GLUCOSE 85  BUN 19  CREATININE 0.76  CALCIUM 8.6   CBC:  Recent Labs  07/31/14 1200  WBC 5.6  HGB 12.8  HCT 38.5  MCV 86.1  PLT 224   D-Dimer:  Recent Labs  07/31/14 1314  DDIMER 0.32   Urine Drug Screen: Drugs of Abuse     Component Value Date/Time   LABOPIA POSITIVE* 12/24/2011 2042   COCAINSCRNUR NONE DETECTED 12/24/2011 2042   LABBENZ POSITIVE* 12/24/2011 2042   AMPHETMU NONE DETECTED 12/24/2011 2042   THCU NONE DETECTED 12/24/2011 2042   LABBARB NONE DETECTED 12/24/2011 2042    Imaging results:  Dg Chest 2 View  07/31/2014   CLINICAL DATA:  58 year old female with a history of chest pain and dizziness for 1 month.  EXAM: CHEST - 2 VIEW  COMPARISON:  Chest x-ray 04/02/2013, PET-CT 11/26/2013  FINDINGS: Cardiomediastinal silhouette within normal limits in size and contour.  No confluent airspace disease, pleural effusion, or pneumothorax.  Increased AP diameter, retrosternal airspace, and associated flattening of the hemidiaphragms. Coarsened interstitial markings.  No displaced fracture.  Unremarkable appearance of the upper abdomen.  IMPRESSION: No radiographic evidence of acute cardiopulmonary disease.  Changes of emphysema, with coarsened interstitial markings.  Signed,  Dulcy Fanny. Earleen Newport, DO  [facility]  [facility]   Electronically Signed   By: Corrie Mckusick D.O.   On:  07/31/2014 12:35    Other results: EKG: normal EKG, normal sinus rhythm.  Assessment & Plan by Problem: Active Problems:   Chest pain  Chest Pain: Patient presents with substernal, non-radiating, sharp chest pain that occurred earlier this morning and lasted under one minute while she was walking in her yard. Pain was not associated with shortness of breath, nausea, vomiting or diaphoresis. Pain is reproducible with palpation. Patient does admit to being under increased stress and having increased anxiety. Patient denies any history of GERD and denies nausea, vomiting or a burning sensation. Vital signs were WNL, poc trop negative, D-dimer 0.32, CXR showed no acute cardiopulmonary disease, and EKG was NSR. Labs were all WNL. Pain is most likely due to anxiety versus musculoskeletal. However, given the patients significant risk factors, we will rule out cardiac causes. Patient has smoked 0.5-1 ppd for 40 years with a history of TIA. Patient has never had a stress test and would be a good candidate for an outpatient stress test.  -Cardiac Diet -Bed Rest -ASA 162 mg QHS -Xanax 1 mg TID -Zofran 4 mg Q6H prn -Nitroglycerin 0.4 mg prn -2D Echo -Bed rest -Cardiac Diet -EKG tomorrow am -Lipid panel -Oxygen therapy -Pulse ox -Telemetry -Troponin I x 3 -Percocet 5-325 mg 2 tabs   Anxiety: Patient admits to increased stress and anxiety. She is on Xanax 1 mg TID at home. Patient should discuss starting an SSRI and switching from Xanax to Klonopin to better control her anxiety. We will continue her current medications for now since she is under duress.  -Continue Xanax 1 mg TID  Tobacco Abuse: Patient admits to 0.5-1 ppd for 40 years. She states she is trying to quit but has been smoking more lately due to increase stress. Patient does not need a nicotine patch. -Pursue cessation strategies  DVT/PE ppx: Lovenox 40 mg SQ daily  Dispo: Disposition is deferred at this time,  awaiting improvement of current medical problems. Anticipated discharge in approximately 1-2 day(s).   The patient does have a current PCP Chesley Noon, MD) and does not need an [facility] hospital follow-up appointment after discharge.  The patient does not have transportation limitations that hinder transportation to clinic appointments.  Signed: Osa Craver, DO PGY-1 Internal Medicine Resident Pager # [phone] 07/31/2014 7:31 PM

## 2014-08-01 DIAGNOSIS — N649 Disorder of breast, unspecified: Secondary | ICD-10-CM | POA: Diagnosis not present

## 2014-08-01 DIAGNOSIS — R110 Nausea: Secondary | ICD-10-CM | POA: Diagnosis not present

## 2014-08-01 DIAGNOSIS — R079 Chest pain, unspecified: Secondary | ICD-10-CM | POA: Diagnosis not present

## 2014-08-01 DIAGNOSIS — E785 Hyperlipidemia, unspecified: Secondary | ICD-10-CM | POA: Diagnosis not present

## 2014-08-01 LAB — TROPONIN I: Troponin I: <0.3 ng/mL (ref <0.30)

## 2014-08-01 LAB — LIPID PANEL
Cholesterol: 169 mg/dL (ref 0–200)
HDL: 69 mg/dL (ref >39)
LDL Cholesterol: 62 mg/dL (ref 0–99)
Total CHOL/HDL Ratio: 2.4 RATIO
Triglycerides: 191 mg/dL — ABNORMAL HIGH (ref <150)
VLDL: 38 mg/dL (ref 0–40)

## 2014-08-01 NOTE — Progress Notes (Signed)
Subjective:  Patient was seen and examined this morning. Patient states she is doing well, but states her chest is sore as if she pulled a muscle. Pain is reproducible with palpation. Patient admits to lifting 2 small bikes yesterday, but denies feeling as if they were too heavy. She denies shortness of breath, nausea, vomiting.   Objective: Vital signs in last 24 hours: Filed Vitals:   07/31/14 1545 07/31/14 1649 07/31/14 2101 08/01/14 0444  BP: 111/85 134/71 99/58 131/86  Pulse: 57 64 55 63  Temp:  98.4 F (36.9 C) 98.2 F (36.8 C) 97.5 F (36.4 C)  TempSrc:  Oral Oral Oral  Resp: 15 16 16 18   Height:  5\' 6"  (1.676 m)    Weight:    72.485 kg (159 lb 12.8 oz)  SpO2: 98% 99% 98% 99%   General: Vital signs reviewed. Patient is well-developed and well-nourished, in no acute distress and cooperative with exam.  Neck: Supple, trachea midline, no carotid bruit present.  Cardiovascular: RRR, S1 normal, S2 normal, no murmurs, gallops, or rubs. Pulmonary/Chest: Clear to auscultation bilaterally, no wheezes, rales, or rhonchi. Tender on palpation of right axilla and chest wall. L breast s/p lumpectomy. Abdominal: Soft, non-tender, non-distended, BS +, no masses, organomegaly, or guarding present.  Musculoskeletal: No joint deformities, erythema, or stiffness, ROM full and nontender. Extremities: No lower extremity edema bilaterally, pulses symmetric and intact bilaterally. No cyanosis or clubbing.  Skin: Warm, dry and intact. No rashes or erythema. Psychiatric: Normal mood and affect. speech and behavior is normal. Cognition and memory are normal.   Lab Results: Basic Metabolic Panel:  Recent Labs Lab 07/31/14 1200  NA 143  K 4.4  CL 107  CO2 22  GLUCOSE 85  BUN 19  CREATININE 0.76  CALCIUM 8.6   CBC:  Recent Labs Lab 07/31/14 1200  WBC 5.6  HGB 12.8  HCT 38.5  MCV 86.1  PLT 224   Cardiac Enzymes:  Recent Labs Lab 07/31/14 2025 08/01/14 0045 08/01/14 0805    TROPONINI <0.30 <0.30 <0.30   D-Dimer:  Recent Labs Lab 07/31/14 1314  DDIMER 0.32   Fasting Lipid Panel:  Recent Labs Lab 08/01/14 0045  CHOL 169  HDL 69  LDLCALC 62  TRIG 191*  CHOLHDL 2.4   Urine Drug Screen: Drugs of Abuse     Component Value Date/Time   LABOPIA POSITIVE* 12/24/2011 2042   COCAINSCRNUR NONE DETECTED 12/24/2011 2042   LABBENZ POSITIVE* 12/24/2011 2042   AMPHETMU NONE DETECTED 12/24/2011 2042   THCU NONE DETECTED 12/24/2011 2042   LABBARB NONE DETECTED 12/24/2011 2042    Studies/Results: Dg Chest 2 View  07/31/2014   CLINICAL DATA:  58 year old female with a history of chest pain and dizziness for 1 month.  EXAM: CHEST - 2 VIEW  COMPARISON:  Chest x-ray 04/02/2013, PET-CT 11/26/2013  FINDINGS: Cardiomediastinal silhouette within normal limits in size and contour.  No confluent airspace disease, pleural effusion, or pneumothorax.  Increased AP diameter, retrosternal airspace, and associated flattening of the hemidiaphragms. Coarsened interstitial markings.  No displaced fracture.  Unremarkable appearance of the upper abdomen.  IMPRESSION: No radiographic evidence of acute cardiopulmonary disease.  Changes of emphysema, with coarsened interstitial markings.  Signed,  Dulcy Fanny. Earleen Newport, DO  [facility]  [facility]   Electronically Signed   By: Corrie Mckusick D.O.   On: 07/31/2014 12:35   Medications:  I have reviewed the patient's current medications. Prior to Admission:  Prescriptions prior to  admission  Medication Sig Dispense Refill Last Dose  . ALPRAZolam (XANAX) 1 MG tablet Take 1 mg by mouth 3 (three) times daily.   07/31/2014 at Unknown time  . aspirin EC 81 MG tablet Take 162 mg by mouth at bedtime.    07/31/2014 at Unknown time  . naproxen sodium (ANAPROX) 220 MG tablet Take 440 mg by mouth 2 (two) times daily as needed (for pain).   07/30/2014 at Unknown time  . docusate sodium (COLACE) 100 MG capsule  Take 1 capsule (100 mg total) by mouth every 12 (twelve) hours. (Patient not taking: Reported on 07/31/2014) 60 capsule 0 Taking  . oxyCODONE-acetaminophen (PERCOCET) 5-325 MG per tablet Take 1 tablet by mouth every 4 (four) hours as needed. (Patient not taking: Reported on 07/31/2014) 20 tablet 0 no   Scheduled Meds: . ALPRAZolam  1 mg Oral TID  . aspirin EC  162 mg Oral QHS  . enoxaparin (LOVENOX) injection  40 mg Subcutaneous Q24H   Continuous Infusions:  PRN Meds:.acetaminophen, nitroGLYCERIN, ondansetron (ZOFRAN) IV Assessment/Plan: Active Problems:   Chest pain  Chest Pain: Patient presented with substernal, non-radiating, sharp chest pain that occurred yesterday morning and lasted under one minute while she was walking in her yard. This morning patient has a soreness over her xiphoid process and chest wall as if she pulled a muscle. Repeat EKG this morning showed NSR. Troponins were negative x 3. Lipid panel showed cholesterol 169, TG 191, HDL 69, LDL 62. Patient has never had a stress test and would be a good candidate for an outpatient stress test. We also discussed diet, exercise, and stress control. Patient will be discharged home today.  -Cardiac Diet -Bed Rest -ASA 162 mg QHS -Xanax 1 mg TID -Zofran 4 mg Q6H prn -Nitroglycerin 0.4 mg prn -2D Echo -Cardiac Diet -Telemetry -Percocet 5-325 mg 2 tabs   Anxiety: Patient admits to increased stress and anxiety. She is on Xanax 1 mg TID at home. Patient agrees she feels addicted to xanax despite having inadequate anxiety control. She is willing and interested in starting an SSRI and switching from Xanax to Klonopin to better control her anxiety. We will continue her current medications for now and have her follow up with her PCP. We explained the importance of not stopping her xanax abruptly and that she has to be weaned off.  -Continue Xanax 1 mg TID  Tobacco Abuse: Patient admits to 0.5-1 ppd for 40 years. Patient has nicotine  patches at home but would like to try quitting on her own. -Continue Nicotine Patches  -Provide Cessation Material  DVT/PE ppx: Lovenox 40 mg SQ daily  Dispo: Disposition is deferred at this time, awaiting improvement of current medical problems.  Anticipated discharge in approximately 1-2 day(s).   The patient does have a current PCP Chesley Noon, MD) and does not need an [facility] hospital follow-up appointment after discharge.  The patient does not have transportation limitations that hinder transportation to clinic appointments.  .Services Needed at time of discharge: Y = Yes, Blank = No PT:   OT:   RN:   Equipment:   Other:     LOS: 1 day   Osa Craver, DO PGY-1 Internal Medicine Resident Pager # [phone] 08/01/2014 10:25 AM

## 2014-08-01 NOTE — Progress Notes (Signed)
UR completed 

## 2014-08-01 NOTE — Progress Notes (Signed)
  Echocardiogram 2D Echocardiogram has been performed.  Mauricio Po 08/01/2014, 12:53 PM

## 2014-08-01 NOTE — Progress Notes (Signed)
Pt discharged via W/C, condition stable.

## 2014-08-01 NOTE — Discharge Instructions (Signed)
Thank you for allowing [name] to be involved in your healthcare while you were hospitalized at [facility].   Please note that there have not been changes to your home medications.  --> PLEASE LOOK AT YOUR DISCHARGE MEDICATION LIST FOR DETAILS.  Please call your PCP if you have any questions or concerns, or any difficulty getting any of your medications.  Please return to the ER if you have worsening of your symptoms or new severe symptoms arise.  Please follow up with your PCP to discuss the following:  1. Outpatient stress test 2. Smoking Cessation 3. Cholesterol Medication and Diet Control 4. Transitioning from Xanax to Klonopin and Zoloft (or another SSRI)   Chest Pain (Nonspecific) It is often hard to give a specific diagnosis for the cause of chest pain. There is always a chance that your pain could be related to something serious, such as a heart attack or a blood clot in the lungs. You need to follow up with your health care provider for further evaluation. CAUSES   Heartburn.  Pneumonia or bronchitis.  Anxiety or stress.  Inflammation around your heart (pericarditis) or lung (pleuritis or pleurisy).  A blood clot in the lung.  A collapsed lung (pneumothorax). It can develop suddenly on its own (spontaneous pneumothorax) or from trauma to the chest.  Shingles infection (herpes zoster virus). The chest wall is composed of bones, muscles, and cartilage. Any of these can be the source of the pain.  The bones can be bruised by injury.  The muscles or cartilage can be strained by coughing or overwork.  The cartilage can be affected by inflammation and become sore (costochondritis). DIAGNOSIS  Lab tests or other studies may be needed to find the cause of your pain. Your health care provider may have you take a test called an ambulatory electrocardiogram (ECG). An ECG records your heartbeat patterns over a 24-hour period. You may also have other tests, such  as:  Transthoracic echocardiogram (TTE). During echocardiography, sound waves are used to evaluate how blood flows through your heart.  Transesophageal echocardiogram (TEE).  Cardiac monitoring. This allows your health care provider to monitor your heart rate and rhythm in real time.  Holter monitor. This is a portable device that records your heartbeat and can help diagnose heart arrhythmias. It allows your health care provider to track your heart activity for several days, if needed.  Stress tests by exercise or by giving medicine that makes the heart beat faster. TREATMENT   Treatment depends on what may be causing your chest pain. Treatment may include:  Acid blockers for heartburn.  Anti-inflammatory medicine.  Pain medicine for inflammatory conditions.  Antibiotics if an infection is present.  You may be advised to change lifestyle habits. This includes stopping smoking and avoiding alcohol, caffeine, and chocolate.  You may be advised to keep your head raised (elevated) when sleeping. This reduces the chance of acid going backward from your stomach into your esophagus. Most of the time, nonspecific chest pain will improve within 2-3 days with rest and mild pain medicine.  HOME CARE INSTRUCTIONS   If antibiotics were prescribed, take them as directed. Finish them even if you start to feel better.  For the next few days, avoid physical activities that bring on chest pain. Continue physical activities as directed.  Do not use any tobacco products, including cigarettes, chewing tobacco, or electronic cigarettes.  Avoid drinking alcohol.  Only take medicine as directed by your health care provider.  Follow your health care provider's suggestions for further testing if your chest pain does not go away.  Keep any follow-up appointments you made. If you do not go to an appointment, you could develop lasting (chronic) problems with pain. If there is any problem keeping an  appointment, call to reschedule. SEEK MEDICAL CARE IF:   Your chest pain does not go away, even after treatment.  You have a rash with blisters on your chest.  You have a fever. SEEK IMMEDIATE MEDICAL CARE IF:   You have increased chest pain or pain that spreads to your arm, neck, jaw, back, or abdomen.  You have shortness of breath.  You have an increasing cough, or you cough up blood.  You have severe back or abdominal pain.  You feel nauseous or vomit.  You have severe weakness.  You faint.  You have chills. This is an emergency. Do not wait to see if the pain will go away. Get medical help at once. Call your local emergency services (911 in [place] Do not drive yourself to the hospital. MAKE SURE YOU:   Understand these instructions.  Will watch your condition.  Will get help right away if you are not doing well or get worse. Document Released: 06/07/2005 Document Revised: 09/02/2013 Document Reviewed: 04/02/2008 [facility] Patient Information 2015 [place], [place]. This information is not intended to replace advice given to you by your health care provider. Make sure you discuss any questions you have with your health care provider.   Generalized Anxiety Disorder Generalized anxiety disorder (GAD) is a mental disorder. It interferes with life functions, including relationships, work, and school. GAD is different from normal anxiety, which everyone experiences at some point in their lives in response to specific life events and activities. Normal anxiety actually helps [name] prepare for and get through these life events and activities. Normal anxiety goes away after the event or activity is over.  GAD causes anxiety that is not necessarily related to specific events or activities. It also causes excess anxiety in proportion to specific events or activities. The anxiety associated with GAD is also difficult to control. GAD can vary from mild to severe. People with severe GAD can  have intense waves of anxiety with physical symptoms (panic attacks).  SYMPTOMS The anxiety and worry associated with GAD are difficult to control. This anxiety and worry are related to many life events and activities and also occur more days than not for 6 months or longer. People with GAD also have three or more of the following symptoms (one or more in children):  Restlessness.   Fatigue.  Difficulty concentrating.   Irritability.  Muscle tension.  Difficulty sleeping or unsatisfying sleep. DIAGNOSIS GAD is diagnosed through an assessment by your health care provider. Your health care provider will ask you questions aboutyour mood,physical symptoms, and events in your life. Your health care provider may ask you about your medical history and use of alcohol or drugs, including prescription medicines. Your health care provider may also do a physical exam and blood tests. Certain medical conditions and the use of certain substances can cause symptoms similar to those associated with GAD. Your health care provider may refer you to a mental health specialist for further evaluation. TREATMENT The following therapies are usually used to treat GAD:   Medication. Antidepressant medication usually is prescribed for long-term daily control. Antianxiety medicines may be added in severe cases, especially when panic attacks occur.   Talk therapy (psychotherapy). Certain types of talk  therapy can be helpful in treating GAD by providing support, education, and guidance. A form of talk therapy called cognitive behavioral therapy can teach you healthy ways to think about and react to daily life events and activities.  Stress managementtechniques. These include yoga, meditation, and exercise and can be very helpful when they are practiced regularly. A mental health specialist can help determine which treatment is best for you. Some people see improvement with one therapy. However, other people require  a combination of therapies. Document Released: 12/23/2012 Document Revised: 01/12/2014 Document Reviewed: 12/23/2012 [facility] Patient Information 2015 [place], [place]. This information is not intended to replace advice given to you by your health care provider. Make sure you discuss any questions you have with your health care provider.

## 2014-08-03 NOTE — Discharge Summary (Signed)
Name: [name] MRN: [id number] DOB: 02/14/1956 58 y.o. PCP: Chesley Noon, MD  Date of Admission: 07/31/2014 11:45 AM Date of Discharge: 08/03/2014 Attending Physician: Dr. Dareen Piano  Discharge Diagnosis:  Principal Problem:   Chest pain Active Problems:   Smoking   Adjustment disorder with mixed anxiety and depressed mood Tobacco Abuse Anxiety  Discharge Medications:   Medication List    TAKE these medications        ALPRAZolam 1 MG tablet  Commonly known as:  XANAX  Take 1 mg by mouth 3 (three) times daily.     aspirin EC 81 MG tablet  Take 162 mg by mouth at bedtime.     docusate sodium 100 MG capsule  Commonly known as:  COLACE  Take 1 capsule (100 mg total) by mouth every 12 (twelve) hours.     naproxen sodium 220 MG tablet  Commonly known as:  ANAPROX  Take 440 mg by mouth 2 (two) times daily as needed (for pain).     oxyCODONE-acetaminophen 5-325 MG per tablet  Commonly known as:  PERCOCET  Take 1 tablet by mouth every 4 (four) hours as needed.        Disposition and follow-up:   [name] was discharged from [facility] in Good condition.  At the hospital follow up visit please address:  1.  Chest Pain: Please assess resolution of symptoms. Patient would be a good candidate for an outpatient stress test, please consider.  Anxiety: Patient's anxiety not well controlled on xanax 1 mg TID. Please consider converting to Klonopin and an SSRI such as Zoloft.  2.  Labs / imaging needed at time of follow-up: Stress Test  3.  Pending labs/ test needing follow-up: None  Follow-up Appointments: Follow-up Information    Follow up with BADGER,MICHAEL C, MD. Schedule an appointment as soon as possible for a visit in 2 weeks.   Specialty:  Family Medicine   Why:  Call Dr. Melford Aase to be seen in 1-2 weeks for hospital follow up. I will send a discharge summary to the office.   Contact information:   [place] [address] [phone]       Discharge Instructions: Discharge Instructions    Call MD for:  difficulty breathing, headache or visual disturbances    Complete by:  As directed      Call MD for:  persistant nausea and vomiting    Complete by:  As directed      Call MD for:  severe uncontrolled pain    Complete by:  As directed      Diet - low sodium heart healthy    Complete by:  As directed      Increase activity slowly    Complete by:  As directed            Consultations:    Procedures Performed:  Dg Chest 2 View  07/31/2014   CLINICAL DATA:  58 year old female with a history of chest pain and dizziness for 1 month.  EXAM: CHEST - 2 VIEW  COMPARISON:  Chest x-ray 04/02/2013, PET-CT 11/26/2013  FINDINGS: Cardiomediastinal silhouette within normal limits in size and contour.  No confluent airspace disease, pleural effusion, or pneumothorax.  Increased AP diameter, retrosternal airspace, and associated flattening of the hemidiaphragms. Coarsened interstitial markings.  No displaced fracture.  Unremarkable appearance of the upper abdomen.  IMPRESSION: No radiographic evidence of acute cardiopulmonary disease.  Changes of emphysema, with coarsened  interstitial markings.  Signed,  Dulcy Fanny. Earleen Newport, DO  [facility]  [facility]   Electronically Signed   By: Corrie Mckusick D.O.   On: 07/31/2014 12:35    2D Echo:  - Left ventricle: The cavity size was normal. Systolic function was normal. The estimated ejection fraction was in the range of 55% to 60%. Wall motion was normal; there were no regional wall motion abnormalities. - Aortic valve: There was mild regurgitation. - Mitral valve: Mildly calcified annulus. - Left atrium: The atrium was mildly dilated.  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 Patient states she was walking around her yard this morning when she had a sudden onset of chest pain located in her inferior substernum. Pain was sharp in nature, like a stabbing pain without radiation and last for less than a minute. Pain was not associated with nausea, vomiting, shortness of breath or diaphoresis. Patient took aspirin and xanax with minimal relief. However, pain only lasted under a minutes and then remained as a nagging uncomfortable sensation. Currently, she has no pain. Pain is reproducible with palpation of sternum. Patient has had increasing shortness of breath with exertion over the past two months. She denies any shortness of breath when laying flat or any chest pain with inspiration. She denies any swelling in her legs. Patient has never had a stress test. She has had chest pain in the past that was associated with anxiety attacks, but she states this pain is not similar to prior anxiety attacks. Patient does admit to increasing stress and anxiety revolved around her neighbor and trying to obtain custody of her grandchildren. She also admits to tobacco use-1/2 to 1 ppd for 40 years. Patient states she is trying to quit.   Hospital Course by problem list: Principal Problem:   Chest pain Active Problems:   Smoking   Adjustment disorder with mixed anxiety and depressed mood   Chest Pain: Patient presented with substernal, non-radiating, sharp chest pain that lasted under one minute while she was walking in her yard. Pain was not associated with shortness of breath, nausea, vomiting or diaphoresis. Pain was reproducible with palpation. Patient did admit to being under increased stress and having increased anxiety. Patient denied any history of GERD and denied nausea, vomiting or a burning sensation. Vital signs were WNL, trop negative x 3, D-dimer 0.32, CXR showed no acute cardiopulmonary disease, and EKG was NSR.  Labs were all WNL. Pain was most likely due to anxiety versus musculoskeletal. However, given the patients significant risk factors, we ruled out cardiac causes. Patient has smoked 0.5-1 ppd for 40 years with a history of TIA. Patient has never had a stress test and would be a good candidate for an outpatient stress test. We continued ASA and xanax. Repeat EKG the following morning was NSR. Lipid panel showed cholesterol 169, TG 191, HDL 69, LDL 62. We discussed diet and exercise to reduce Triglycerides. Please see echo report above. Please consider referral for outpatient stress test.  Anxiety: Patient admited to increased stress and anxiety. She is on Xanax 1 mg TID at home. Please consider starting an SSRI and switching from Xanax to Klonopin to better control her anxiety. We continued her current medications on discharge. We explained the importance of not stopping her  xanax abruptly and that she has to be weaned off.   Tobacco Abuse: Patient admitted to 0.5-1 ppd for 40 years. She stated she is trying to quit but has been smoking more lately due to increase stress. Patient has nicotine patches but would prefer to quit on her own.   Discharge Vitals:   BP 131/86 mmHg  Pulse 63  Temp(Src) 97.5 F (36.4 C) (Oral)  Resp 18  Ht 5\' 6"  (1.676 m)  Wt 72.485 kg (159 lb 12.8 oz)  BMI 25.80 kg/m2  SpO2 99%  Discharge Labs:  No results found for this or any previous visit (from the past 24 hour(s)).  Signed: Osa Craver, DO PGY-1 Internal Medicine Resident Pager # [phone] 08/03/2014 8:58 AM

## 2014-08-19 ENCOUNTER — Ambulatory Visit: Payer: Medicaid Other | Admitting: Physician Assistant

## 2014-08-19 ENCOUNTER — Other Ambulatory Visit: Payer: Self-pay | Admitting: *Deleted

## 2014-08-19 ENCOUNTER — Telehealth: Payer: Self-pay | Admitting: *Deleted

## 2014-08-19 ENCOUNTER — Telehealth: Payer: Self-pay | Admitting: Hematology and Oncology

## 2014-08-19 DIAGNOSIS — D0510 Intraductal carcinoma in situ of unspecified breast: Secondary | ICD-10-CM

## 2014-08-19 NOTE — Telephone Encounter (Signed)
Patient called stating that she has pain under her left arm and breast. States area is "puffy". Has taken Aleve but no pain relief. Pain wakes her up at night and she is not able to carry her purse with her left arm. Patient scheduled for appt on 12/11. Patient verbalizes understanding.

## 2014-08-19 NOTE — Telephone Encounter (Signed)
lvm for pt regarding to Dec appt.Marland KitchenMarland KitchenMarland Kitchen

## 2014-08-21 ENCOUNTER — Telehealth: Payer: Self-pay | Admitting: Hematology and Oncology

## 2014-08-21 ENCOUNTER — Ambulatory Visit (HOSPITAL_BASED_OUTPATIENT_CLINIC_OR_DEPARTMENT_OTHER): Payer: Medicaid Other | Admitting: Hematology and Oncology

## 2014-08-21 ENCOUNTER — Other Ambulatory Visit (HOSPITAL_BASED_OUTPATIENT_CLINIC_OR_DEPARTMENT_OTHER): Payer: Medicaid Other

## 2014-08-21 VITALS — BP 125/90 | HR 56 | Temp 97.8°F | Resp 18 | Ht 66.0 in | Wt 153.6 lb

## 2014-08-21 DIAGNOSIS — D0512 Intraductal carcinoma in situ of left breast: Secondary | ICD-10-CM

## 2014-08-21 DIAGNOSIS — D0510 Intraductal carcinoma in situ of unspecified breast: Secondary | ICD-10-CM

## 2014-08-21 DIAGNOSIS — Z170 Estrogen receptor positive status [ER+]: Secondary | ICD-10-CM

## 2014-08-21 DIAGNOSIS — C7A010 Malignant carcinoid tumor of the duodenum: Secondary | ICD-10-CM

## 2014-08-21 LAB — COMPREHENSIVE METABOLIC PANEL (CC13)
ALBUMIN: 3.9 g/dL (ref 3.5–5.0)
ALT: 17 U/L (ref 0–55)
ANION GAP: 9 meq/L (ref 3–11)
AST: 15 U/L (ref 5–34)
Alkaline Phosphatase: 67 U/L (ref 40–150)
BUN: 11 mg/dL (ref 7.0–26.0)
CO2: 26 meq/L (ref 22–29)
Calcium: 9.6 mg/dL (ref 8.4–10.4)
Chloride: 107 mEq/L (ref 98–109)
Creatinine: 0.8 mg/dL (ref 0.6–1.1)
EGFR: 86 mL/min/{1.73_m2} — AB (ref >90)
GLUCOSE: 89 mg/dL (ref 70–140)
POTASSIUM: 4.2 meq/L (ref 3.5–5.1)
Sodium: 142 mEq/L (ref 136–145)
Total Bilirubin: 0.38 mg/dL (ref 0.20–1.20)
Total Protein: 6.8 g/dL (ref 6.4–8.3)

## 2014-08-21 LAB — CBC WITH DIFFERENTIAL/PLATELET
BASO%: 0.2 % (ref 0.0–2.0)
Basophils Absolute: 0 10*3/uL (ref 0.0–0.1)
EOS ABS: 0 10*3/uL (ref 0.0–0.5)
EOS%: 0.6 % (ref 0.0–7.0)
HCT: 42.6 % (ref 34.8–46.6)
HGB: 13.5 g/dL (ref 11.6–15.9)
LYMPH%: 18.3 % (ref 14.0–49.7)
MCH: 28.4 pg (ref 25.1–34.0)
MCHC: 31.8 g/dL (ref 31.5–36.0)
MCV: 89.3 fL (ref 79.5–101.0)
MONO#: 0.4 10*3/uL (ref 0.1–0.9)
MONO%: 5.6 % (ref 0.0–14.0)
NEUT#: 5.1 10*3/uL (ref 1.5–6.5)
NEUT%: 75.3 % (ref 38.4–76.8)
Platelets: 204 10*3/uL (ref 145–400)
RBC: 4.77 10*6/uL (ref 3.70–5.45)
RDW: 13.5 % (ref 11.2–14.5)
WBC: 6.8 10*3/uL (ref 3.9–10.3)
lymph#: 1.2 10*3/uL (ref 0.9–3.3)

## 2014-08-21 MED ORDER — OXYCODONE-ACETAMINOPHEN 5-325 MG PO TABS: 1.0000 | ORAL_TABLET | ORAL | Status: DC | PRN

## 2014-08-21 NOTE — Assessment & Plan Note (Signed)
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 Patient is scheduled to undergo PET CT scan in January 2016. She has a liver lesion that is being followed. She does not have any symptoms related to carcinoid syndrome and previous chromogranin A levels have all been normal.  Prior history of abdominal pain: Follows with gastroenterology.

## 2014-08-21 NOTE — Assessment & Plan Note (Signed)
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 Scars from previous surgery were noted.because of this new symptom, I would like to order a left breast ultrasound and mammogram. I discussed with her the DCIS by definition does not metastasize. It is very possible that she may have pulled a muscle and that's why she is having so much pain over the past 2 weeks. I prescribed her Percocet 20 tablets today. He does not appear that the patient had received any pain medications for the past year from any provider.

## 2014-08-21 NOTE — Telephone Encounter (Signed)
per pof to che appt time & date-gave pt updated sch

## 2014-08-21 NOTE — Progress Notes (Signed)
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 She had lumpectomy followed by radiation therapy and refused tamoxifen therapy because of a history of TIAs. At the same time she had small bowel carcinoid tumor resection it was found to be stage II. Follow-up scans revealed a liver lesion which was further evaluated with PET scans and octreotide scans and it had been found to be benign. She is being followed for both of these conditions with surveillance evaluations. She came in for an urgent visit today because she is having a lot of pain in the left arm and the left breast. This is been going on for 2 weeks. She was raking leaves and this may have prompted the onset of her symptoms. She has been taking over-the-counter pain pills but it has not been helping her. Previously she had been on pain management but she had not been on any pain prescriptions for a long time  REVIEW OF SYSTEMS:   Constitutional: Denies fevers, chills or abnormal weight loss Eyes: Denies blurriness of vision Ears, nose, mouth, throat, and face: Denies mucositis or sore throat Respiratory: Denies cough, dyspnea or wheezes Cardiovascular: Denies palpitation, chest  discomfort or lower extremity swelling Gastrointestinal:  Denies nausea, heartburn or change in bowel habits Skin: Denies abnormal skin rashes Lymphatics: Denies new lymphadenopathy or easy bruising Neurological:Denies numbness, tingling or new weaknesses Behavioral/Psych: Mood is stable, no new changes  Breast: left breast and axillary pain All other systems were reviewed with the patient and are negative.  I have reviewed the past medical history, past surgical history, social history and family history with the patient and they are unchanged from previous note.  ALLERGIES:  is allergic to statins; ace inhibitors; ciprofloxacin hcl; and tramadol.  MEDICATIONS:  Current Outpatient Prescriptions  Medication Sig Dispense Refill  . ALPRAZolam (XANAX) 1 MG tablet Take 1 mg by mouth 3 (three) times daily.    Marland Kitchen aspirin EC 81 MG tablet Take 162 mg by mouth at bedtime.     . docusate sodium (COLACE) 100 MG capsule Take 1 capsule (100 mg total) by mouth every 12 (twelve) hours. (Patient not taking: Reported on 07/31/2014) 60 capsule 0  . naproxen sodium (ANAPROX) 220 MG tablet Take 440 mg by mouth 2 (two) times daily as needed (for pain).    Marland Kitchen oxyCODONE-acetaminophen (PERCOCET) 5-325 MG per tablet Take 1 tablet by mouth every 4 (four) hours as needed. 20 tablet 0   No current facility-administered medications for this visit.    PHYSICAL EXAMINATION: ECOG PERFORMANCE STATUS: 1 - Symptomatic but completely ambulatory  Filed Vitals:   08/21/14 1217  BP: 125/90  Pulse: 56  Temp: 97.8 F (36.6 C)  Resp: 18   Filed  Weights   08/21/14 1217  Weight: 153 lb 9.6 oz (69.673 kg)    GENERAL:alert, no distress and comfortable SKIN: skin color, texture, turgor are normal, no rashes or significant lesions EYES: normal, Conjunctiva are pink and non-injected, sclera clear OROPHARYNX:no exudate, no erythema and lips, buccal mucosa, and tongue normal  NECK: supple, thyroid normal size, non-tender,  without nodularity LYMPH:  no palpable lymphadenopathy in the cervical, axillary or inguinal LUNGS: clear to auscultation and percussion with normal breathing effort HEART: regular rate & rhythm and no murmurs and no lower extremity edema ABDOMEN:abdomen soft, non-tender and normal bowel sounds Musculoskeletal:no cyanosis of digits and no clubbing  NEURO: alert & oriented x 3 with fluent speech, no focal motor/sensory deficits BREAST: No palpable masses or nodules in either right or left breasts. No palpable axillary supraclavicular or infraclavicular adenopathy no breast tenderness or nipple discharge. Left breast is very tender to palpation including the left axilla   LABORATORY DATA:  I have reviewed the data as listed   Chemistry      Component Value Date/Time   NA 142 08/21/2014 1207   NA 143 07/31/2014 1200   K 4.2 08/21/2014 1207   K 4.4 07/31/2014 1200   CL 107 07/31/2014 1200   CO2 26 08/21/2014 1207   CO2 22 07/31/2014 1200   BUN 11.0 08/21/2014 1207   BUN 19 07/31/2014 1200   CREATININE 0.8 08/21/2014 1207   CREATININE 0.76 07/31/2014 1200      Component Value Date/Time   CALCIUM 9.6 08/21/2014 1207   CALCIUM 8.6 07/31/2014 1200   ALKPHOS 67 08/21/2014 1207   ALKPHOS 62 03/26/2014 1005   AST 15 08/21/2014 1207   AST 15 03/26/2014 1005   ALT 17 08/21/2014 1207   ALT 14 03/26/2014 1005   BILITOT 0.38 08/21/2014 1207   BILITOT 0.4 03/26/2014 1005       Lab Results  Component Value Date   WBC 6.8 08/21/2014   HGB 13.5 08/21/2014   HCT 42.6 08/21/2014   MCV 89.3 08/21/2014   PLT 204 08/21/2014   NEUTROABS 5.1 08/21/2014     RADIOGRAPHIC STUDIES: I have personally reviewed the radiology reports and agreed with their findings. Mammograms done in July 2015 were normal  ASSESSMENT & PLAN:  Ductal carcinoma in situ (DCIS) of left breast Left breast DCIS 3.5 cm ER/PR positive status post lumpectomy and radiation therapy declined tamoxifen therapy, currently  on surveillance  Left breast pain: today's Breast exam did not reveal any lumps or nodules. Scars from previous surgery were noted.because of this new symptom, I would like to order a left breast ultrasound and mammogram. I discussed with her the DCIS by definition does not metastasize. It is very possible that she may have pulled a muscle and that's why she is having so much pain over the past 2 weeks. I prescribed her Percocet 20 tablets today. He does not appear that the patient had received any pain medications for the past year from any provider.  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 Patient is scheduled to undergo PET CT scan in January 2016. She has a liver lesion that is being followed. She does not have any symptoms related to carcinoid syndrome and previous chromogranin A levels have all been normal.  Prior history of abdominal pain: Follows with gastroenterology.  Patient will come back to see [place] in January after undergoing  a PET CT scan. There is any major abnormality in the mammogram and ultrasound will follow up on that.  Orders Placed This Encounter  Procedures  . MM Digital Diagnostic Unilat L    Standing Status: Future     Number of Occurrences:      Standing Expiration Date: 08/21/2015    Order Specific Question:  Reason for Exam (SYMPTOM  OR DIAGNOSIS REQUIRED)    Answer:  Left breast tenderness and axillary tenderness    Order Specific Question:  Is the patient pregnant?    Answer:  No    Order Specific Question:  Preferred imaging location?    Answer:  [facility]  . US BREAST COMPLETE UNI LEFT INC AXILLA    Standing Status: Future     Number of Occurrences:      Standing Expiration Date: 10/23/2015    Order Specific Question:  Reason for Exam (SYMPTOM  OR DIAGNOSIS REQUIRED)    Answer:  pain left breast and axilla with H/O DCIS left breast    Order  Specific Question:  Preferred imaging location?    Answer:  [facility]   The patient has a good understanding of the overall plan. she agrees with it. She will call with any problems that may develop before her next visit here.   Rulon Eisenmenger, MD 08/21/2014 1:50 PM

## 2014-08-21 NOTE — Addendum Note (Signed)
Addended by: Prentiss Bells on: 08/21/2014 04:03 PM   Modules accepted: Medications

## 2014-08-31 ENCOUNTER — Ambulatory Visit
Admission: RE | Admit: 2014-08-31 | Discharge: 2014-08-31 | Disposition: A | Discharge status: Home / Self Care | Payer: Medicaid Other | Source: Ambulatory Visit | Attending: Hematology and Oncology | Admitting: Hematology and Oncology

## 2014-08-31 DIAGNOSIS — D0512 Intraductal carcinoma in situ of left breast: Secondary | ICD-10-CM

## 2014-09-09 ENCOUNTER — Telehealth: Payer: Self-pay | Admitting: Hematology and Oncology

## 2014-09-09 ENCOUNTER — Ambulatory Visit (HOSPITAL_BASED_OUTPATIENT_CLINIC_OR_DEPARTMENT_OTHER): Payer: Medicaid Other | Admitting: Hematology and Oncology

## 2014-09-09 VITALS — BP 129/67 | HR 71 | Temp 98.1°F | Resp 18 | Ht 66.0 in | Wt 160.9 lb

## 2014-09-09 DIAGNOSIS — D0512 Intraductal carcinoma in situ of left breast: Secondary | ICD-10-CM

## 2014-09-09 DIAGNOSIS — L989 Disorder of the skin and subcutaneous tissue, unspecified: Secondary | ICD-10-CM

## 2014-09-09 DIAGNOSIS — Z85030 Personal history of malignant carcinoid tumor of large intestine: Secondary | ICD-10-CM

## 2014-09-09 DIAGNOSIS — C7A010 Malignant carcinoid tumor of the duodenum: Secondary | ICD-10-CM

## 2014-09-09 NOTE — Progress Notes (Signed)
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 She came in complaining of breast tenderness and obtained a mammogram and ultrasound and she is here today to discuss the results. She reports that she is taking anti-inflammatory in her breast tenderness has improved. She pointed to the eschar on the medial aspect of the right thigh which has been hurting her recently. It was associated with a reddish spot overlying it.  REVIEW OF SYSTEMS:   Constitutional: Denies fevers, chills or abnormal weight loss Eyes: Denies blurriness of vision Ears, nose, mouth, throat, and face: Denies mucositis or sore throat Respiratory: Denies cough, dyspnea or wheezes Cardiovascular: Denies palpitation, chest discomfort or lower extremity swelling Gastrointestinal:  Denies nausea, heartburn or change in bowel habits Skin: Denies abnormal skin rashes Lymphatics: Denies new lymphadenopathy or easy bruising Neurological:Denies numbness, tingling or new weaknesses Behavioral/Psych: Mood is stable, no new changes  Breast: tenderness in the breast is improving All other systems were reviewed with the patient and are negative.  I have reviewed the past medical history, past surgical history, social history and family history with the patient and they are unchanged from previous note.  ALLERGIES:  is  allergic to statins; ace inhibitors; sertraline hcl; ciprofloxacin hcl; and tramadol.  MEDICATIONS:  Current Outpatient Prescriptions  Medication Sig Dispense Refill  . ALPRAZolam (XANAX) 1 MG tablet Take 1 mg by mouth 2 (two) times daily.     Marland Kitchen aspirin EC 81 MG tablet Take 162 mg by mouth at bedtime.     . docusate sodium (COLACE) 100 MG capsule Take 1 capsule (100 mg total) by mouth every 12 (twelve) hours. 60 capsule 0  . naproxen sodium (ANAPROX) 220 MG tablet Take 440 mg by mouth 2 (two) times daily as needed (for pain).     No current facility-administered medications for this visit.    PHYSICAL EXAMINATION: ECOG PERFORMANCE STATUS: 1 - Symptomatic but completely ambulatory  Filed Vitals:   09/09/14 0833  BP: 129/67  Pulse: 71  Temp: 98.1 F (36.7 C)  Resp: 18   Filed Weights   09/09/14 0833  Weight: 160 lb 14.4 oz (72.984 kg)    GENERAL:alert, no distress and comfortable SKIN: skin color, texture, turgor are normal, no rashes or significant lesions EYES: normal, Conjunctiva are pink and non-injected, sclera clear OROPHARYNX:no exudate, no erythema and lips, buccal mucosa, and tongue normal  NECK: supple, thyroid normal size, non-tender, without nodularity LYMPH:  no palpable lymphadenopathy in the cervical, axillary or inguinal LUNGS: clear to auscultation and percussion with normal breathing effort HEART: regular rate & rhythm and no murmurs and no lower extremity edema ABDOMEN:abdomen soft, non-tender and normal bowel sounds Musculoskeletal:no cyanosis of digits and no clubbing  NEURO: alert & oriented x 3 with fluent speech, no focal motor/sensory deficits  LABORATORY DATA:  I have reviewed the data as listed   Chemistry      Component Value Date/Time  NA 142 08/21/2014 1207   NA 143 07/31/2014 1200   K 4.2 08/21/2014 1207   K 4.4 07/31/2014 1200   CL 107 07/31/2014 1200   CO2 26 08/21/2014 1207   CO2 22 07/31/2014 1200   BUN 11.0 08/21/2014 1207   BUN  19 07/31/2014 1200   CREATININE 0.8 08/21/2014 1207   CREATININE 0.76 07/31/2014 1200      Component Value Date/Time   CALCIUM 9.6 08/21/2014 1207   CALCIUM 8.6 07/31/2014 1200   ALKPHOS 67 08/21/2014 1207   ALKPHOS 62 03/26/2014 1005   AST 15 08/21/2014 1207   AST 15 03/26/2014 1005   ALT 17 08/21/2014 1207   ALT 14 03/26/2014 1005   BILITOT 0.38 08/21/2014 1207   BILITOT 0.4 03/26/2014 1005       Lab Results  Component Value Date   WBC 6.8 08/21/2014   HGB 13.5 08/21/2014   HCT 42.6 08/21/2014   MCV 89.3 08/21/2014   PLT 204 08/21/2014   NEUTROABS 5.1 08/21/2014     RADIOGRAPHIC STUDIES: I have personally reviewed the radiology reports and agreed with their findings. Mammogram and ultrasound on 09/01/2014 were normal  ASSESSMENT & PLAN:  Ductal carcinoma in situ (DCIS) of left breast Left breast DCIS 3.5 cm ER/PR positive status post lumpectomy and radiation therapy declined tamoxifen therapy, currently on surveillance  Surveillance: 1. Breast exam done 08/21/2014 was normal 2. Mammogram 09/01/2014 is normal she also had an ultrasound at the same time which was also normal  I recommend one see her follow-up after mammograms for a physical exam of the breasts.   Malignant carcinoid tumor of duodenum Status post bowel resection July 2014 at the same time that she had lumpectomy. Patient is scheduled to undergo PET CT scan in January 2016. She has a liver lesion that is being followed. She does not have any symptoms related to carcinoid syndrome and previous chromogranin A levels have all been normal.  lesion on the right thigh: On palpation it feels like a previously infected hair follicle is subcutaneous in nature and is slightly tender to palpation. There is an erythematous spot overlying this area.  No orders of the defined types were placed in this encounter.   The patient has a good understanding of the overall plan. she agrees with it. She will call with any  problems that may develop before her next visit here.   Rulon Eisenmenger, MD 09/09/2014 9:12 AM

## 2014-09-09 NOTE — Telephone Encounter (Signed)
per pof to sch pt appt-gave pt copy of sch °

## 2014-09-09 NOTE — Assessment & Plan Note (Signed)
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 Breast exam done 08/21/2014 was normal 2. Mammogram 09/01/2014 is normal she also had an ultrasound at the same time which was also normal  I recommend one see her follow-up after mammograms for a physical exam of the breasts.

## 2014-09-09 NOTE — Addendum Note (Signed)
Addended by: Prentiss Bells on: 09/09/2014 09:33 AM   Modules accepted: Medications

## 2014-09-09 NOTE — Assessment & Plan Note (Signed)
Status post bowel resection July 2014 at the same time that she had lumpectomy. Patient is scheduled to undergo PET CT scan in January 2016. She has a liver lesion that is being followed. She does not have any symptoms related to carcinoid syndrome and previous chromogranin A levels have all been normal.

## 2014-09-22 ENCOUNTER — Telehealth: Payer: Self-pay

## 2014-09-22 NOTE — Telephone Encounter (Signed)
Rturned pt call - confirmed appt 1/14 8 am for lab - required for PET scan.  Pt voiced understanding

## 2014-09-24 ENCOUNTER — Other Ambulatory Visit (HOSPITAL_BASED_OUTPATIENT_CLINIC_OR_DEPARTMENT_OTHER): Payer: Medicaid Other

## 2014-09-24 DIAGNOSIS — C7B8 Other secondary neuroendocrine tumors: Secondary | ICD-10-CM

## 2014-09-24 DIAGNOSIS — D0512 Intraductal carcinoma in situ of left breast: Secondary | ICD-10-CM

## 2014-09-24 LAB — CBC WITH DIFFERENTIAL/PLATELET
BASO%: 0.6 % (ref 0.0–2.0)
Basophils Absolute: 0 10*3/uL (ref 0.0–0.1)
EOS%: 1.1 % (ref 0.0–7.0)
Eosinophils Absolute: 0.1 10*3/uL (ref 0.0–0.5)
HCT: 42.1 % (ref 34.8–46.6)
HGB: 13.6 g/dL (ref 11.6–15.9)
LYMPH%: 20.8 % (ref 14.0–49.7)
MCH: 28.9 pg (ref 25.1–34.0)
MCHC: 32.3 g/dL (ref 31.5–36.0)
MCV: 89.5 fL (ref 79.5–101.0)
MONO#: 0.4 10*3/uL (ref 0.1–0.9)
MONO%: 6.6 % (ref 0.0–14.0)
NEUT#: 4 10*3/uL (ref 1.5–6.5)
NEUT%: 70.9 % (ref 38.4–76.8)
Platelets: 191 10*3/uL (ref 145–400)
RBC: 4.71 10*6/uL (ref 3.70–5.45)
RDW: 13.4 % (ref 11.2–14.5)
WBC: 5.6 10*3/uL (ref 3.9–10.3)
lymph#: 1.2 10*3/uL (ref 0.9–3.3)

## 2014-09-24 LAB — COMPREHENSIVE METABOLIC PANEL (CC13)
ALBUMIN: 4 g/dL (ref 3.5–5.0)
ALK PHOS: 67 U/L (ref 40–150)
ALT: 14 U/L (ref 0–55)
AST: 15 U/L (ref 5–34)
Anion Gap: 7 mEq/L (ref 3–11)
BUN: 12.7 mg/dL (ref 7.0–26.0)
CALCIUM: 9.2 mg/dL (ref 8.4–10.4)
CO2: 27 meq/L (ref 22–29)
Chloride: 107 mEq/L (ref 98–109)
Creatinine: 0.8 mg/dL (ref 0.6–1.1)
EGFR: 88 mL/min/{1.73_m2} — ABNORMAL LOW (ref >90)
Glucose: 85 mg/dl (ref 70–140)
Potassium: 4.4 mEq/L (ref 3.5–5.1)
Sodium: 141 mEq/L (ref 136–145)
Total Bilirubin: 0.47 mg/dL (ref 0.20–1.20)
Total Protein: 6.8 g/dL (ref 6.4–8.3)

## 2014-09-25 ENCOUNTER — Other Ambulatory Visit: Payer: Self-pay

## 2014-09-25 DIAGNOSIS — C7A010 Malignant carcinoid tumor of the duodenum: Secondary | ICD-10-CM

## 2014-09-25 DIAGNOSIS — D0512 Intraductal carcinoma in situ of left breast: Secondary | ICD-10-CM

## 2014-09-26 LAB — CHROMOGRANIN A: CHROMOGRANIN A: 10 ng/mL (ref <=15)

## 2014-09-28 ENCOUNTER — Other Ambulatory Visit: Payer: Medicaid Other

## 2014-09-28 ENCOUNTER — Ambulatory Visit (HOSPITAL_COMMUNITY): Payer: Medicaid Other

## 2014-09-30 ENCOUNTER — Telehealth: Payer: Self-pay | Admitting: Hematology and Oncology

## 2014-09-30 ENCOUNTER — Ambulatory Visit (HOSPITAL_BASED_OUTPATIENT_CLINIC_OR_DEPARTMENT_OTHER): Payer: Medicaid Other | Admitting: Hematology and Oncology

## 2014-09-30 VITALS — BP 149/84 | HR 67 | Temp 97.8°F | Resp 18 | Ht 66.0 in | Wt 158.5 lb

## 2014-09-30 DIAGNOSIS — D0512 Intraductal carcinoma in situ of left breast: Secondary | ICD-10-CM

## 2014-09-30 DIAGNOSIS — C7A010 Malignant carcinoid tumor of the duodenum: Secondary | ICD-10-CM

## 2014-09-30 DIAGNOSIS — Z853 Personal history of malignant neoplasm of breast: Secondary | ICD-10-CM

## 2014-09-30 NOTE — Assessment & Plan Note (Addendum)
Status post bowel resection July 2014 at the same time that she had lumpectomy. She has a liver lesion that is being followed. She does not have any symptoms related to carcinoid syndrome and previous chromogranin A levels have all been normal.  Plan: CT chest abdomen pelvis is planned for 10/09/2014 Chromogranin A level is 10 (up from 3.6, 8 months ago) but still in the normal range. Patient has diarrhea for the past week. When she returns back if the diarrhea does not resolve, we might consider giving her injection with Sandostatin LAR monthly Return to clinic in 2 weeks to follow-up on the CT scan results

## 2014-09-30 NOTE — Telephone Encounter (Signed)
, °

## 2014-09-30 NOTE — Progress Notes (Signed)
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 She is here after a mammogram has been done recently. The mammogram has been normal. She complains of diarrhea for the past week. Although today her bowels are normal. The diarrhea strikes she has to go urgently. Denies any cramping or pain. Denies any flushing sensation.  REVIEW OF SYSTEMS:   Constitutional: Denies fevers, chills or abnormal weight loss Eyes: Denies blurriness of vision Ears, nose, mouth, throat, and face: Denies mucositis or sore throat Respiratory: Denies cough, dyspnea or wheezes Cardiovascular: Denies palpitation, chest discomfort or lower extremity swelling Gastrointestinal:  Diarrhea for 1 week Skin: Denies abnormal skin rashes Lymphatics: Denies new lymphadenopathy or easy bruising Neurological:Denies numbness, tingling or new weaknesses Behavioral/Psych: Mood is stable, no new changes  Breast:  denies any pain or lumps or nodules in either breasts All other systems were reviewed with the patient and are negative.  I have reviewed the past medical history, past surgical  history, social history and family history with the patient and they are unchanged from previous note.  ALLERGIES:  is allergic to statins; ace inhibitors; sertraline hcl; ciprofloxacin hcl; and tramadol.  MEDICATIONS:  No current outpatient prescriptions on file.   No current facility-administered medications for this visit.    PHYSICAL EXAMINATION: ECOG PERFORMANCE STATUS: 1 - Symptomatic but completely ambulatory  Filed Vitals:   09/30/14 0821  BP: 149/84  Pulse: 67  Temp: 97.8 F (36.6 C)  Resp: 18   Filed Weights   09/30/14 0821  Weight: 158 lb 8 oz (71.895 kg)    GENERAL:alert, no distress and comfortable SKIN: skin color, texture, turgor are normal, no rashes or significant lesions EYES: normal, Conjunctiva are pink and non-injected, sclera clear OROPHARYNX:no exudate, no erythema and lips, buccal mucosa, and tongue normal  NECK: supple, thyroid normal size, non-tender, without nodularity LYMPH:  no palpable lymphadenopathy in the cervical, axillary or inguinal LUNGS: clear to auscultation and percussion with normal breathing effort HEART: regular rate & rhythm and no murmurs and no lower extremity edema ABDOMEN:abdomen soft, non-tender and normal bowel sounds Musculoskeletal:no cyanosis of digits and no clubbing  NEURO: alert & oriented x 3 with fluent speech, no focal motor/sensory deficits   LABORATORY DATA:  I have reviewed the data as listed   Chemistry      Component Value Date/Time   NA 141 09/24/2014 0807   NA 143 07/31/2014 1200   K 4.4 09/24/2014 0807   K 4.4 07/31/2014 1200   CL 107 07/31/2014 1200   CO2 27 09/24/2014 0807   CO2 22 07/31/2014 1200   BUN 12.7 09/24/2014 0807  BUN 19 07/31/2014 1200   CREATININE 0.8 09/24/2014 0807   CREATININE 0.76 07/31/2014 1200      Component Value Date/Time   CALCIUM 9.2 09/24/2014 0807   CALCIUM 8.6 07/31/2014 1200   ALKPHOS 67 09/24/2014 0807   ALKPHOS 62 03/26/2014 1005   AST 15 09/24/2014 0807    AST 15 03/26/2014 1005   ALT 14 09/24/2014 0807   ALT 14 03/26/2014 1005   BILITOT 0.47 09/24/2014 0807   BILITOT 0.4 03/26/2014 1005       Lab Results  Component Value Date   WBC 5.6 09/24/2014   HGB 13.6 09/24/2014   HCT 42.1 09/24/2014   MCV 89.5 09/24/2014   PLT 191 09/24/2014   NEUTROABS 4.0 09/24/2014     RADIOGRAPHIC STUDIES: I have personally reviewed the radiology reports and agreed with their findings. Mammogram December 2015 is normal  ASSESSMENT & PLAN:  Ductal carcinoma in situ (DCIS) of left breast Left breast DCIS 3.5 cm ER/PR positive status post lumpectomy and radiation therapy declined tamoxifen therapy, currently on surveillance  Surveillance: 1. Breast exam done 08/21/2014 was normal 2. Mammogram 09/01/2014 is normal she also had an ultrasound at the same time which was also normal    Malignant carcinoid tumor of duodenum Status post bowel resection July 2014 at the same time that she had lumpectomy. She has a liver lesion that is being followed. She does not have any symptoms related to carcinoid syndrome and previous chromogranin A levels have all been normal.  Plan: CT chest abdomen pelvis is planned for 10/09/2014 Chromogranin A level is 10 (up from 3.6, 8 months ago) but still in the normal range. Patient has diarrhea for the past week. When she returns back if the diarrhea does not resolve, we might consider giving her injection with Sandostatin LAR monthly Return to clinic in 2 weeks to follow-up on the CT scan results    No orders of the defined types were placed in this encounter.   The patient has a good understanding of the overall plan. she agrees with it. She will call with any problems that may develop before her next visit here.   Rulon Eisenmenger, MD

## 2014-09-30 NOTE — Assessment & Plan Note (Signed)
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 Breast exam done 08/21/2014 was normal 2. Mammogram 09/01/2014 is normal she also had an ultrasound at the same time which was also normal

## 2014-10-01 ENCOUNTER — Ambulatory Visit: Payer: Medicaid Other | Admitting: Hematology and Oncology

## 2014-10-01 ENCOUNTER — Ambulatory Visit: Payer: Medicaid Other | Admitting: Cardiovascular Disease

## 2014-10-05 ENCOUNTER — Ambulatory Visit: Payer: Medicaid Other | Admitting: Hematology and Oncology

## 2014-10-05 ENCOUNTER — Telehealth: Payer: Self-pay

## 2014-10-05 NOTE — Telephone Encounter (Signed)
Returned pt call re: denial of CT CAP.  Let pt know our pre-auth was already working on the denial.  Appt on Fri left in case we get auth prior to then.  If not, we will r/s before then and let pt know.  Pt voiced understanding.

## 2014-10-08 ENCOUNTER — Telehealth: Payer: Self-pay | Admitting: Hematology and Oncology

## 2014-10-08 ENCOUNTER — Telehealth: Payer: Self-pay | Admitting: *Deleted

## 2014-10-08 ENCOUNTER — Other Ambulatory Visit: Payer: Self-pay | Admitting: *Deleted

## 2014-10-08 NOTE — Telephone Encounter (Signed)
Received VM from patient asking if the CT scan she is scheduled for tomorrow has been approved yet. Spoke to [employer] - she states CT scan has not yet been approved. Called and offered patient two options: to have just the bone scan tomorrow and CT rescheduled or to go ahead and reschedule both to late next week. Patient states she would prefer to have both done on the same day. Bone scan and CT scan rescheduled to 10/16/14 with a 10:45am arrival time. Called and left VM for patient on her cell phone with new appt time. Asked patient to please call us back if the new time does not work for her or if she has any further questions.  Also left VM for Dr. Geralyn Flash desk nurse with this information in case Dr. Lindi Adie wants to reschedule MD visit on 10/15/14.

## 2014-10-08 NOTE — Telephone Encounter (Signed)
, °

## 2014-10-09 ENCOUNTER — Encounter (HOSPITAL_COMMUNITY): Payer: Medicaid Other

## 2014-10-09 ENCOUNTER — Ambulatory Visit (HOSPITAL_COMMUNITY): Payer: Medicaid Other

## 2014-10-12 ENCOUNTER — Other Ambulatory Visit: Payer: Self-pay | Admitting: *Deleted

## 2014-10-15 ENCOUNTER — Other Ambulatory Visit: Payer: Self-pay | Admitting: *Deleted

## 2014-10-15 ENCOUNTER — Encounter: Payer: Self-pay | Admitting: Hematology and Oncology

## 2014-10-15 ENCOUNTER — Telehealth: Payer: Self-pay | Admitting: *Deleted

## 2014-10-15 ENCOUNTER — Ambulatory Visit: Payer: Medicaid Other | Admitting: Hematology and Oncology

## 2014-10-15 NOTE — Telephone Encounter (Signed)
Patient calls.  Her insurance has denied her CT scan, but they will pay for her Bone Scan.  She does not think she needs a bone scan and would like me to check.  Discussed with Dr. Lindi Adie.  Patient is right. She does not need a bone scan.  Olivia Canter Will cancel the bone scan and I have called the patient and let her know that she does not need the bone scan and it has been cancelled.  She so appreciated my checking this.

## 2014-10-15 NOTE — Progress Notes (Unsigned)
10/15/2014  Spoke with Dr. Geralyn Flash nurse, Beverlee Nims and made her aware that ct scans for this patient have been denied for the second time.  I will submit a new request due to the fact that I have received 2 denials, this case is closed,.  Vaughan Basta [phone]

## 2014-10-16 ENCOUNTER — Encounter (HOSPITAL_COMMUNITY): Payer: Medicaid Other

## 2014-10-16 ENCOUNTER — Ambulatory Visit (HOSPITAL_COMMUNITY): Payer: Medicaid Other

## 2014-10-19 ENCOUNTER — Ambulatory Visit: Payer: Medicaid Other | Admitting: Hematology and Oncology

## 2014-10-23 ENCOUNTER — Encounter (HOSPITAL_COMMUNITY): Payer: Self-pay | Admitting: *Deleted

## 2014-10-23 ENCOUNTER — Emergency Department (HOSPITAL_COMMUNITY)
Admission: EM | Admit: 2014-10-23 | Discharge: 2014-10-23 | Disposition: A | Discharge status: Home / Self Care | Payer: Medicaid Other | Attending: Emergency Medicine | Admitting: Emergency Medicine

## 2014-10-23 ENCOUNTER — Emergency Department (HOSPITAL_COMMUNITY): Payer: Medicaid Other

## 2014-10-23 DIAGNOSIS — R110 Nausea: Secondary | ICD-10-CM | POA: Diagnosis not present

## 2014-10-23 DIAGNOSIS — I10 Essential (primary) hypertension: Secondary | ICD-10-CM | POA: Insufficient documentation

## 2014-10-23 DIAGNOSIS — M1990 Unspecified osteoarthritis, unspecified site: Secondary | ICD-10-CM | POA: Diagnosis not present

## 2014-10-23 DIAGNOSIS — Z8701 Personal history of pneumonia (recurrent): Secondary | ICD-10-CM | POA: Insufficient documentation

## 2014-10-23 DIAGNOSIS — K5900 Constipation, unspecified: Secondary | ICD-10-CM | POA: Insufficient documentation

## 2014-10-23 DIAGNOSIS — Z7982 Long term (current) use of aspirin: Secondary | ICD-10-CM | POA: Insufficient documentation

## 2014-10-23 DIAGNOSIS — Z853 Personal history of malignant neoplasm of breast: Secondary | ICD-10-CM | POA: Insufficient documentation

## 2014-10-23 DIAGNOSIS — R1012 Left upper quadrant pain: Secondary | ICD-10-CM | POA: Insufficient documentation

## 2014-10-23 DIAGNOSIS — Z720 Tobacco use: Secondary | ICD-10-CM | POA: Diagnosis not present

## 2014-10-23 DIAGNOSIS — Z8673 Personal history of transient ischemic attack (TIA), and cerebral infarction without residual deficits: Secondary | ICD-10-CM | POA: Diagnosis not present

## 2014-10-23 DIAGNOSIS — Z79899 Other long term (current) drug therapy: Secondary | ICD-10-CM | POA: Insufficient documentation

## 2014-10-23 DIAGNOSIS — Z8619 Personal history of other infectious and parasitic diseases: Secondary | ICD-10-CM | POA: Insufficient documentation

## 2014-10-23 DIAGNOSIS — Z8639 Personal history of other endocrine, nutritional and metabolic disease: Secondary | ICD-10-CM | POA: Diagnosis not present

## 2014-10-23 DIAGNOSIS — Z8659 Personal history of other mental and behavioral disorders: Secondary | ICD-10-CM | POA: Insufficient documentation

## 2014-10-23 DIAGNOSIS — R109 Unspecified abdominal pain: Secondary | ICD-10-CM | POA: Diagnosis present

## 2014-10-23 LAB — CBC WITH DIFFERENTIAL/PLATELET
Basophils Absolute: 0 10*3/uL (ref 0.0–0.1)
Basophils Relative: 0 % (ref 0–1)
EOS PCT: 1 % (ref 0–5)
Eosinophils Absolute: 0.1 10*3/uL (ref 0.0–0.7)
HCT: 41.8 % (ref 36.0–46.0)
Hemoglobin: 13.9 g/dL (ref 12.0–15.0)
LYMPHS ABS: 1.2 10*3/uL (ref 0.7–4.0)
Lymphocytes Relative: 17 % (ref 12–46)
MCH: 29.5 pg (ref 26.0–34.0)
MCHC: 33.3 g/dL (ref 30.0–36.0)
MCV: 88.7 fL (ref 78.0–100.0)
Monocytes Absolute: 0.3 10*3/uL (ref 0.1–1.0)
Monocytes Relative: 5 % (ref 3–12)
NEUTROS ABS: 5.6 10*3/uL (ref 1.7–7.7)
NEUTROS PCT: 77 % (ref 43–77)
PLATELETS: 244 10*3/uL (ref 150–400)
RBC: 4.71 MIL/uL (ref 3.87–5.11)
RDW: 13.2 % (ref 11.5–15.5)
WBC: 7.2 10*3/uL (ref 4.0–10.5)

## 2014-10-23 LAB — URINALYSIS, ROUTINE W REFLEX MICROSCOPIC
Bilirubin Urine: NEGATIVE
Glucose, UA: NEGATIVE mg/dL
Hgb urine dipstick: NEGATIVE
Ketones, ur: NEGATIVE mg/dL
LEUKOCYTES UA: NEGATIVE
NITRITE: NEGATIVE
PH: 5.5 (ref 5.0–8.0)
Protein, ur: NEGATIVE mg/dL
SPECIFIC GRAVITY, URINE: 1.022 (ref 1.005–1.030)
Urobilinogen, UA: 0.2 mg/dL (ref 0.0–1.0)

## 2014-10-23 LAB — BASIC METABOLIC PANEL
Anion gap: 7 (ref 5–15)
BUN: 9 mg/dL (ref 6–23)
CO2: 26 mmol/L (ref 19–32)
Calcium: 9.6 mg/dL (ref 8.4–10.5)
Chloride: 107 mmol/L (ref 96–112)
Creatinine, Ser: 0.67 mg/dL (ref 0.50–1.10)
GFR calc Af Amer: >90 mL/min (ref >90)
GLUCOSE: 106 mg/dL — AB (ref 70–99)
Potassium: 4.3 mmol/L (ref 3.5–5.1)
Sodium: 140 mmol/L (ref 135–145)

## 2014-10-23 LAB — LIPASE, BLOOD: Lipase: 22 U/L (ref 11–59)

## 2014-10-23 MED ORDER — IOHEXOL 300 MG/ML  SOLN
100.0000 mL | Freq: Once | INTRAMUSCULAR | Status: AC | PRN
  Administered 2014-10-23: 100 mL via INTRAVENOUS

## 2014-10-23 MED ORDER — ONDANSETRON HCL 4 MG/2ML IJ SOLN
4.0000 mg | Freq: Once | INTRAMUSCULAR | Status: AC
  Administered 2014-10-23: 4 mg via INTRAVENOUS
  Filled 2014-10-23: qty 2

## 2014-10-23 MED ORDER — HYDROMORPHONE HCL 1 MG/ML IJ SOLN
1.0000 mg | Freq: Once | INTRAMUSCULAR | Status: AC
  Administered 2014-10-23: 1 mg via INTRAVENOUS
  Filled 2014-10-23: qty 1

## 2014-10-23 MED ORDER — DICYCLOMINE HCL 20 MG PO TABS: 20.0000 mg | ORAL_TABLET | Freq: Two times a day (BID) | ORAL | Status: DC

## 2014-10-23 MED ORDER — ONDANSETRON HCL 4 MG PO TABS: 4.0000 mg | ORAL_TABLET | Freq: Four times a day (QID) | ORAL | Status: DC

## 2014-10-23 MED ORDER — IOHEXOL 300 MG/ML  SOLN
25.0000 mL | Freq: Once | INTRAMUSCULAR | Status: AC | PRN
  Administered 2014-10-23: 25 mL via ORAL

## 2014-10-23 NOTE — Discharge Instructions (Signed)
Abdominal Pain, Women °Abdominal (stomach, pelvic, or belly) pain can be caused by many things. It is important to tell your doctor: °· The location of the pain. °· Does it come and go or is it present all the time? °· Are there things that start the pain (eating certain foods, exercise)? °· Are there other symptoms associated with the pain (fever, nausea, vomiting, diarrhea)? °All of this is helpful to know when trying to find the cause of the pain. °CAUSES  °· Stomach: virus or bacteria infection, or ulcer. °· Intestine: appendicitis (inflamed appendix), regional ileitis (Crohn's disease), ulcerative colitis (inflamed colon), irritable bowel syndrome, diverticulitis (inflamed diverticulum of the colon), or cancer of the stomach or intestine. °· Gallbladder disease or stones in the gallbladder. °· Kidney disease, kidney stones, or infection. °· Pancreas infection or cancer. °· Fibromyalgia (pain disorder). °· Diseases of the female organs: °¨ Uterus: fibroid (non-cancerous) tumors or infection. °¨ Fallopian tubes: infection or tubal pregnancy. °¨ Ovary: cysts or tumors. °¨ Pelvic adhesions (scar tissue). °¨ Endometriosis (uterus lining tissue growing in the pelvis and on the pelvic organs). °¨ Pelvic congestion syndrome (female organs filling up with blood just before the menstrual period). °¨ Pain with the menstrual period. °¨ Pain with ovulation (producing an egg). °¨ Pain with an IUD (intrauterine device, birth control) in the uterus. °¨ Cancer of the female organs. °· Functional pain (pain not caused by a disease, may improve without treatment). °· Psychological pain. °· Depression. °DIAGNOSIS  °Your doctor will decide the seriousness of your pain by doing an examination. °· Blood tests. °· X-rays. °· Ultrasound. °· CT scan (computed tomography, special type of X-ray). °· MRI (magnetic resonance imaging). °· Cultures, for infection. °· Barium enema (dye inserted in the large intestine, to better view it with  X-rays). °· Colonoscopy (looking in intestine with a lighted tube). °· Laparoscopy (minor surgery, looking in abdomen with a lighted tube). °· Major abdominal exploratory surgery (looking in abdomen with a large incision). °TREATMENT  °The treatment will depend on the cause of the pain.  °· Many cases can be observed and treated at home. °· Over-the-counter medicines recommended by your caregiver. °· Prescription medicine. °· Antibiotics, for infection. °· Birth control pills, for painful periods or for ovulation pain. °· Hormone treatment, for endometriosis. °· Nerve blocking injections. °· Physical therapy. °· Antidepressants. °· Counseling with a psychologist or psychiatrist. °· Minor or major surgery. °HOME CARE INSTRUCTIONS  °· Do not take laxatives, unless directed by your caregiver. °· Take over-the-counter pain medicine only if ordered by your caregiver. Do not take aspirin because it can cause an upset stomach or bleeding. °· Try a clear liquid diet (broth or water) as ordered by your caregiver. Slowly move to a bland diet, as tolerated, if the pain is related to the stomach or intestine. °· Have a thermometer and take your temperature several times a day, and record it. °· Bed rest and sleep, if it helps the pain. °· Avoid sexual intercourse, if it causes pain. °· Avoid stressful situations. °· Keep your follow-up appointments and tests, as your caregiver orders. °· If the pain does not go away with medicine or surgery, you may try: °¨ Acupuncture. °¨ Relaxation exercises (yoga, meditation). °¨ Group therapy. °¨ Counseling. °SEEK MEDICAL CARE IF:  °· You notice certain foods cause stomach pain. °· Your home care treatment is not helping your pain. °· You need stronger pain medicine. °· You want your IUD removed. °· You feel faint or   lightheaded. °· You develop nausea and vomiting. °· You develop a rash. °· You are having side effects or an allergy to your medicine. °SEEK IMMEDIATE MEDICAL CARE IF:  °· Your  pain does not go away or gets worse. °· You have a fever. °· Your pain is felt only in portions of the abdomen. The right side could possibly be appendicitis. The left lower portion of the abdomen could be colitis or diverticulitis. °· You are passing blood in your stools (bright red or black tarry stools, with or without vomiting). °· You have blood in your urine. °· You develop chills, with or without a fever. °· You pass out. °MAKE SURE YOU:  °· Understand these instructions. °· Will watch your condition. °· Will get help right away if you are not doing well or get worse. °Document Released: 06/25/2007 Document Revised: 01/12/2014 Document Reviewed: 07/15/2009 °Exit[employer]® Patient Information ©2015 [employer]. This information is not intended to replace advice given to you by your health care provider. Make sure you discuss any questions you have with your health care provider. ° °

## 2014-10-23 NOTE — ED Notes (Signed)
Pt not able to tolerate liquids.

## 2014-10-23 NOTE — ED Provider Notes (Signed)
CSN: [id number]     Arrival date & time 10/23/14  0840 History   First MD Initiated Contact with Patient 10/23/14 [phone]     Chief Complaint  Patient presents with  . Abdominal Pain     (Consider location/radiation/quality/duration/timing/severity/associated sxs/prior Treatment) Patient is a 59 y.o. female presenting with abdominal pain. The history is provided by the patient. No language interpreter was used.  Abdominal Pain Pain location:  LUQ Pain quality: aching and dull   Pain radiates to:  Does not radiate Pain severity:  Moderate Duration:  1 week Timing:  Intermittent Progression:  Waxing and waning Chronicity:  Recurrent Relieved by:  Nothing Worsened by:  Nothing tried Ineffective treatments:  None tried Associated symptoms: constipation and nausea   Associated symptoms: no chest pain, no chills, no cough, no diarrhea, no dysuria, no fatigue, no fever, no shortness of breath, no sore throat and no vomiting   Risk factors: multiple surgeries     Past Medical History  Diagnosis Date  . Jaundice   . Breast disorder     lump in left breast  . TIA (transient ischemic attack) 06/2011    "can't see real well out of left eye since; weaker on left side since"  . Hyperlipidemia   . Varicose veins   . Adjustment disorder with mixed anxiety and depressed mood 01/05/2013  . Allergy   . Anxiety   . Pain management     goes to [facility] clinic, but she doesn't intend to return   . H/O echocardiogram     not finding report in EPIC, pt. reports that it was before her appt. /w Dr. Johnsie Cancel   . Stroke 2012    TIA  . Brain aneurysm 2012    2cms.  . Complication of anesthesia     hallucinations , anxiety   . Hx of radiation therapy 07/17/13-09/03/13    left breast,60.4Gy, 32/33 fx completed  . Hypertension     "never took my RX" (07/31/2014)  . Walking pneumonia ~ 2008  . Hepatitis C dx'd ~ 2000    "specialist says it cleared itself on it's own"  . Hepatitis B dx'd ~ 1980  .  Arthritis     "knees,wrists" (07/31/2014)  . Depression   . 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 Laparoscopy N/A 04/02/2013    Procedure: Diaganostic Lap.; Small Bowel Resection;  Surgeon: Harl Bowie, MD;  Location: [place];  Service: General;  Laterality: N/A;  . Colon surgery  03/2013    bowel obstruction , malignant tumor    . Vaginal births   36     birthed twins at -[redacted] weeks gestation - both survived , total of 3 pregnancies - all born vaginally  . Breast lumpectomy with sentinel lymph node biopsy Left 06/05/2013    Procedure: BREAST LUMPECTOMY WITH SENTINEL LYMPH NODE BX;  Surgeon: Harl Bowie, MD;  Location: [place];  Service: General;  Laterality: Left;  . Evacuation breast hematoma Left 06/05/2013    Procedure: EVACUATION HEMATOMA BREAST;  Surgeon: Harl Bowie, MD;  Location: [place];  Service: General;  Laterality: Left;  . Breast lumpectomy Left 04/02/2013    Procedure: Excision  of Left Breast Mass;  Surgeon: Harl Bowie, MD;  Location: [place];  Service: General;  Laterality: Left;  . Breast biopsy Left 03/2013; 05/2013  . Tumor excision  04/02/2013    "carcoid tumor removed"  .  Dilation and curettage of uterus  ~ 1990    S/P miscarriage   Family History  Problem Relation Age of Onset  . Diabetes Paternal Grandfather   . Diabetes Paternal Grandmother   . Cancer Father     lung  . Diabetes Mother   . Heart disease Mother   . Cancer Mother     cervical  . Diabetes Brother   . Stroke Brother   . Heart disease Sister   . Diabetes Sister   . Cancer Sister     cervical   History  Substance Use Topics  . Smoking status: Current Every Day Smoker -- 0.50 packs/day for 36 years    Types: Cigarettes  . Smokeless tobacco: Never Used  . Alcohol Use: 2.4 oz/week    4 Glasses of wine per week   OB History    Gravida Para Term Preterm AB TAB SAB Ectopic Multiple Living    5 4  2 1 1    4      Review of Systems  Constitutional: Negative for fever, chills, diaphoresis, activity change, appetite change and fatigue.  HENT: Negative for congestion, facial swelling, rhinorrhea and sore throat.   Eyes: Negative for photophobia and discharge.  Respiratory: Negative for cough, chest tightness and shortness of breath.   Cardiovascular: Negative for chest pain, palpitations and leg swelling.  Gastrointestinal: Positive for nausea, abdominal pain and constipation. Negative for vomiting and diarrhea.  Endocrine: Negative for polydipsia and polyuria.  Genitourinary: Negative for dysuria, frequency, difficulty urinating and pelvic pain.  Musculoskeletal: Negative for back pain, arthralgias, neck pain and neck stiffness.  Skin: Negative for color change and wound.  Allergic/Immunologic: Negative for immunocompromised state.  Neurological: Negative for facial asymmetry, weakness, numbness and headaches.  Hematological: Does not bruise/bleed easily.  Psychiatric/Behavioral: Negative for confusion and agitation.      Allergies  Statins; Ace inhibitors; Sertraline hcl; Ciprofloxacin hcl; and Tramadol  Home Medications   Prior to Admission medications   Medication Sig Start Date End Date Taking? Authorizing Provider  aspirin EC 81 MG tablet Take 81 mg by mouth daily.   Yes Historical Provider, MD  Docusate Calcium (STOOL SOFTENER PO) Take 1 tablet by mouth daily.   Yes Historical Provider, MD   BP 127/86 mmHg  Pulse 77  Temp(Src) 97.9 F (36.6 C) (Oral)  Resp 18  SpO2 98% Physical Exam  Constitutional: She is oriented to person, place, and time. She appears well-developed and well-nourished. No distress.  HENT:  Head: Normocephalic and atraumatic.  Mouth/Throat: No oropharyngeal exudate.  Eyes: Pupils are equal, round, and reactive to light.  Neck: Normal range of motion. Neck supple.  Cardiovascular: Normal rate, regular rhythm and normal heart sounds.   Exam reveals no gallop and no friction rub.   No murmur heard. Pulmonary/Chest: Effort normal and breath sounds normal. No respiratory distress. She has no wheezes. She has no rales.  Abdominal: Soft. Bowel sounds are normal. She exhibits no distension and no mass. There is no tenderness. There is no rebound and no guarding.  Musculoskeletal: Normal range of motion. She exhibits no edema or tenderness.  Neurological: She is alert and oriented to person, place, and time.  Skin: Skin is warm and dry.  Psychiatric: She has a normal mood and affect.    ED Course  Procedures (including critical care time) Labs Review Labs Reviewed  URINE CULTURE  CBC WITH DIFFERENTIAL/PLATELET  BASIC METABOLIC PANEL  LIPASE, BLOOD  URINALYSIS, ROUTINE W REFLEX MICROSCOPIC  POC  URINE PREG, ED    Imaging Review No results found.   EKG Interpretation None      MDM   Final diagnoses:  LUQ pain   Pt is a 59 y.o. female with Pmhx as above who presents with LUQ ab pain, distension, present for 1 week, worse since last night, but has been having some abdominal pain for past 2 years since carcinoid tumor removal and SBO in 7/'14. Denies fever, chills, vom, d/a, dysuria. +nausea. On PE, VSS, pt in NAD. +LUQ ttp w/o rebound or guarding. Pt was supposed to have CT and PET scan by her oncologist which was denied by her insurance. Will get CT today.  She does not have symptoms of carcinoid syndrome currently, thought last onc note reported d/a and increased Chromogranin A level  No acute lab findings, CT ab/pelvis w/o acute findings. I believe pt can f/u with PCP/oncologist for further w/u.    Dennise B Newbold evaluation in the Emergency Department is complete. It has been determined that no acute conditions requiring further emergency intervention are present at this time. The patient/guardian have been advised of the diagnosis and plan. We have discussed signs and symptoms that warrant return to the ED, such as  changes or worsening in symptoms, worsening pain, fever, inability to tolerate liquids.       Ernestina Patches, MD 10/26/14 [phone]

## 2014-10-23 NOTE — ED Notes (Signed)
Pt reports left side abd pain x 1 week but more severe today. Having nausea, feels like abd is distended and abnormal bowel movements.

## 2014-10-25 LAB — URINE CULTURE: Colony Count: 95000

## 2014-10-27 ENCOUNTER — Telehealth: Payer: Self-pay

## 2014-10-27 ENCOUNTER — Telehealth: Payer: Self-pay | Admitting: Hematology and Oncology

## 2014-10-27 NOTE — Telephone Encounter (Signed)
Let pt know request being sent to scheduling and if she has not heard from them within a week she should call desk nurse back.  Pt voiced understanding.

## 2014-10-27 NOTE — Telephone Encounter (Signed)
Pt went to ER on Friday with stomach bloating and vomiting. CT was done in ER. They Rx bentyl and zofran. Pt is doing better. Had 4 full stools this am using fiber supplement and water. Pt wants to know if Dr [name] will want to see her now that she has a CT done. She was having problems getting PET or CT approved, so did not have a f/u with Dr [name].

## 2014-10-27 NOTE — Telephone Encounter (Signed)
, °

## 2014-11-13 ENCOUNTER — Telehealth: Payer: Self-pay | Admitting: Hematology and Oncology

## 2014-11-13 ENCOUNTER — Ambulatory Visit (HOSPITAL_BASED_OUTPATIENT_CLINIC_OR_DEPARTMENT_OTHER): Payer: Medicaid Other | Admitting: Hematology and Oncology

## 2014-11-13 VITALS — BP 148/95 | HR 66 | Temp 98.1°F | Resp 18 | Ht 66.0 in | Wt 161.9 lb

## 2014-11-13 DIAGNOSIS — C7A010 Malignant carcinoid tumor of the duodenum: Secondary | ICD-10-CM

## 2014-11-13 DIAGNOSIS — F419 Anxiety disorder, unspecified: Secondary | ICD-10-CM

## 2014-11-13 DIAGNOSIS — F4323 Adjustment disorder with mixed anxiety and depressed mood: Secondary | ICD-10-CM

## 2014-11-13 DIAGNOSIS — Z170 Estrogen receptor positive status [ER+]: Secondary | ICD-10-CM

## 2014-11-13 DIAGNOSIS — D0512 Intraductal carcinoma in situ of left breast: Secondary | ICD-10-CM

## 2014-11-13 MED ORDER — ALPRAZOLAM 0.5 MG PO TABS: 0.5000 mg | ORAL_TABLET | Freq: Two times a day (BID) | ORAL | Status: DC | PRN

## 2014-11-13 NOTE — Assessment & Plan Note (Addendum)
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 Breast exam done 08/21/2014 was normal 2. 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 She has a liver lesion that is being followed. She does not have any symptoms related to carcinoid syndrome and previous chromogranin A levels have all been normal.  Plan: CT chest abdomen pelvis 10/23/2014 is normal no evidence of any liver lesions.  Chromogranin A level is 10 (up from 3.6) but still in the normal range. Diarrhea: Patient went to emergency room and had a CT of the abdomen which did not reveal any abnormalities. She has seen a gastroenterologist who diagnosed her with irritable bowel syndrome.  Severe anxiety disorder: I prescribed her Xanax 0.5 mg twice a day as needed and gave her 45 tablets today. Patient does not want to take this on a long-term basis. Return to clinic in 3 months for follow-up.

## 2014-11-13 NOTE — Telephone Encounter (Signed)
appts made and avs pritned for pt °

## 2014-11-13 NOTE — Addendum Note (Signed)
Addended by: Prentiss Bells on: 11/13/2014 09:37 AM   Modules accepted: Medications

## 2014-11-13 NOTE — Progress Notes (Signed)
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 She was in the emergency room recently with abdominal distention and pain and was evaluated in discharged home with the diagnosis of irritable bowel syndrome. After she had a few bowel movements she has done very well from it. She does not have any further episodes of diarrhea. She reports that all of February showed 8 episodes of diarrhea. Her major problem today is a severe anxiety related to taking care of HER-2 grandchildren and her son and her ex-daughter-in-law.  REVIEW OF SYSTEMS:   Constitutional: Denies fevers, chills or abnormal weight loss Eyes: Denies blurriness of vision Ears, nose, mouth, throat, and face: Denies mucositis or sore throat Respiratory: Denies cough, dyspnea or wheezes Cardiovascular: Denies palpitation, chest discomfort or lower extremity swelling Gastrointestinal:  Denies nausea, heartburn or change in bowel habits Skin: Denies abnormal skin rashes Lymphatics: Denies new lymphadenopathy or easy bruising Neurological:Denies numbness, tingling or new weaknesses Behavioral/Psych: Severe anxiety and difficulty with sleep  Breast:   denies any pain or lumps or nodules in either breasts All other systems were reviewed with the patient and are negative.  I have reviewed the past medical history, past surgical history, social history and family history with the patient and they are unchanged from previous note.  ALLERGIES:  is allergic to statins; ace inhibitors; sertraline hcl; ciprofloxacin hcl; and tramadol.  MEDICATIONS:  Current Outpatient Prescriptions  Medication Sig Dispense Refill  . ALPRAZolam (XANAX) 0.5 MG tablet Take 1 tablet (0.5 mg total) by mouth 2 (two) times daily as needed for anxiety. 45 tablet 0  . aspirin EC 81 MG tablet Take 81 mg by mouth daily.    Marland Kitchen dicyclomine (BENTYL) 20 MG tablet Take 1 tablet (20 mg total) by mouth 2 (two) times daily. 20 tablet 0  . Docusate Calcium (STOOL SOFTENER PO) Take 1 tablet by mouth daily.    . ondansetron (ZOFRAN) 4 MG tablet Take 1 tablet (4 mg total) by mouth every 6 (six) hours. 12 tablet 0   No current facility-administered medications for this visit.    PHYSICAL EXAMINATION: ECOG PERFORMANCE STATUS: 1 - Symptomatic but completely ambulatory  Filed Vitals:   11/13/14 0819  BP: 148/95  Pulse: 66  Temp: 98.1 F (36.7 C)  Resp: 18   Filed Weights   11/13/14 0819  Weight: 161 lb 14.4 oz (73.437 kg)    GENERAL:alert, no distress and comfortable SKIN: skin color, texture, turgor are normal, no rashes or significant lesions EYES: normal, Conjunctiva are pink and non-injected, sclera clear OROPHARYNX:no exudate, no erythema and lips, buccal mucosa, and tongue normal  NECK: supple, thyroid normal size, non-tender, without nodularity LYMPH:  no palpable lymphadenopathy in the  cervical, axillary or inguinal LUNGS: clear to auscultation and percussion with normal breathing effort HEART: regular rate & rhythm and no murmurs and no lower extremity edema ABDOMEN:abdomen soft, non-tender and normal bowel sounds Musculoskeletal:no cyanosis of digits and no  clubbing  NEURO: alert & oriented x 3 with fluent speech, no focal motor/sensory deficits, severe anxiety  LABORATORY DATA:  I have reviewed the data as listed   Chemistry      Component Value Date/Time   NA 140 10/23/2014 0906   NA 141 09/24/2014 0807   K 4.3 10/23/2014 0906   K 4.4 09/24/2014 0807   CL 107 10/23/2014 0906   CO2 26 10/23/2014 0906   CO2 27 09/24/2014 0807   BUN 9 10/23/2014 0906   BUN 12.7 09/24/2014 0807   CREATININE 0.67 10/23/2014 0906   CREATININE 0.8 09/24/2014 0807      Component Value Date/Time   CALCIUM 9.6 10/23/2014 0906   CALCIUM 9.2 09/24/2014 0807   ALKPHOS 67 09/24/2014 0807   ALKPHOS 62 03/26/2014 1005   AST 15 09/24/2014 0807   AST 15 03/26/2014 1005   ALT 14 09/24/2014 0807   ALT 14 03/26/2014 1005   BILITOT 0.47 09/24/2014 0807   BILITOT 0.4 03/26/2014 1005       Lab Results  Component Value Date   WBC 7.2 10/23/2014   HGB 13.9 10/23/2014   HCT 41.8 10/23/2014   MCV 88.7 10/23/2014   PLT 244 10/23/2014   NEUTROABS 5.6 10/23/2014    ASSESSMENT & PLAN:  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 Breast exam done 08/21/2014 was normal 2. 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 She has a liver lesion that is being followed. She does not have any symptoms related to carcinoid syndrome and previous chromogranin A levels have all been normal.  Plan: CT chest abdomen pelvis 10/23/2014 is normal no evidence of any liver lesions.  Chromogranin A level is 10 (up from 3.6) but still in the normal range. Diarrhea: Patient went to emergency room and had a CT  of the abdomen which did not reveal any abnormalities. She has seen a gastroenterologist who diagnosed her with irritable bowel syndrome.  Severe anxiety disorder: I prescribed her Xanax 0.5 mg twice a day as needed and gave her 45 tablets today. Patient does not want to take this on a long-term basis. Return to clinic in 3 months for follow-up.    Orders Placed This Encounter  Procedures  . Chromogranin A    Standing Status: Future     Number of Occurrences:      Standing Expiration Date: 11/13/2015  . CBC with Differential    Standing Status: Future     Number of Occurrences:      Standing Expiration Date: 11/13/2015  . Comprehensive metabolic panel (Cmet) - [facility]    Standing Status: Future     Number of Occurrences:      Standing Expiration Date: 11/13/2015   The patient has a good understanding of the overall plan. she agrees with it. She will call with any problems that may develop before her next visit here.   Lindi Adie,  Tyler Deis, MD

## 2014-12-15 ENCOUNTER — Other Ambulatory Visit: Payer: Self-pay

## 2014-12-15 ENCOUNTER — Telehealth: Payer: Self-pay | Admitting: *Deleted

## 2014-12-15 DIAGNOSIS — F4323 Adjustment disorder with mixed anxiety and depressed mood: Secondary | ICD-10-CM

## 2014-12-15 MED ORDER — ALPRAZOLAM 0.5 MG PO TABS: 0.5000 mg | ORAL_TABLET | Freq: Two times a day (BID) | ORAL | Status: DC | PRN

## 2014-12-15 NOTE — Telephone Encounter (Signed)
Xanax prescrption faxed to [facility].  Sent to scan.

## 2014-12-15 NOTE — Telephone Encounter (Signed)
SPOKE WITH DR.GUDENA'S NURSE, Lonell Grandchild. SHE WILL CALL IN THE PRESCRIPTION BY THE END OF TODAY. NOTIFIED PT. SHE VOICES UNDERSTANDING.

## 2015-01-22 ENCOUNTER — Telehealth: Payer: Self-pay

## 2015-01-22 DIAGNOSIS — F4323 Adjustment disorder with mixed anxiety and depressed mood: Secondary | ICD-10-CM

## 2015-01-22 MED ORDER — ALPRAZOLAM 0.5 MG PO TABS: 0.5000 mg | ORAL_TABLET | Freq: Two times a day (BID) | ORAL | Status: DC | PRN

## 2015-01-22 NOTE — Telephone Encounter (Signed)
Pt walk in asking for xanax refill. Prescription prepared.

## 2015-02-05 ENCOUNTER — Other Ambulatory Visit (HOSPITAL_BASED_OUTPATIENT_CLINIC_OR_DEPARTMENT_OTHER): Payer: Medicaid Other

## 2015-02-05 DIAGNOSIS — D0512 Intraductal carcinoma in situ of left breast: Secondary | ICD-10-CM

## 2015-02-05 DIAGNOSIS — C7A010 Malignant carcinoid tumor of the duodenum: Secondary | ICD-10-CM | POA: Diagnosis not present

## 2015-02-05 LAB — COMPREHENSIVE METABOLIC PANEL (CC13)
ALT: 16 U/L (ref 0–55)
AST: 15 U/L (ref 5–34)
Albumin: 3.7 g/dL (ref 3.5–5.0)
Alkaline Phosphatase: 71 U/L (ref 40–150)
Anion Gap: 9 mEq/L (ref 3–11)
BUN: 14.5 mg/dL (ref 7.0–26.0)
CHLORIDE: 110 meq/L — AB (ref 98–109)
CO2: 23 mEq/L (ref 22–29)
Calcium: 8.8 mg/dL (ref 8.4–10.4)
Creatinine: 0.7 mg/dL (ref 0.6–1.1)
EGFR: >90 mL/min/{1.73_m2} (ref >90)
GLUCOSE: 96 mg/dL (ref 70–140)
Potassium: 4.5 mEq/L (ref 3.5–5.1)
Sodium: 141 mEq/L (ref 136–145)
Total Bilirubin: 0.49 mg/dL (ref 0.20–1.20)
Total Protein: 6.6 g/dL (ref 6.4–8.3)

## 2015-02-05 LAB — CBC WITH DIFFERENTIAL/PLATELET
BASO%: 0.3 % (ref 0.0–2.0)
Basophils Absolute: 0 10*3/uL (ref 0.0–0.1)
EOS%: 1 % (ref 0.0–7.0)
Eosinophils Absolute: 0.1 10*3/uL (ref 0.0–0.5)
HCT: 41.7 % (ref 34.8–46.6)
HGB: 13.7 g/dL (ref 11.6–15.9)
LYMPH%: 23.8 % (ref 14.0–49.7)
MCH: 29 pg (ref 25.1–34.0)
MCHC: 32.9 g/dL (ref 31.5–36.0)
MCV: 88.2 fL (ref 79.5–101.0)
MONO#: 0.4 10*3/uL (ref 0.1–0.9)
MONO%: 5.8 % (ref 0.0–14.0)
NEUT#: 4.2 10*3/uL (ref 1.5–6.5)
NEUT%: 69.1 % (ref 38.4–76.8)
PLATELETS: 189 10*3/uL (ref 145–400)
RBC: 4.73 10*6/uL (ref 3.70–5.45)
RDW: 13.7 % (ref 11.2–14.5)
WBC: 6.1 10*3/uL (ref 3.9–10.3)
lymph#: 1.4 10*3/uL (ref 0.9–3.3)

## 2015-02-09 ENCOUNTER — Other Ambulatory Visit (HOSPITAL_COMMUNITY)
Admission: RE | Admit: 2015-02-09 | Discharge: 2015-02-09 | Disposition: A | Discharge status: Home / Self Care | Payer: Medicaid Other | Source: Ambulatory Visit | Attending: Family Medicine | Admitting: Family Medicine

## 2015-02-09 ENCOUNTER — Emergency Department (HOSPITAL_COMMUNITY)
Admission: EM | Admit: 2015-02-09 | Discharge: 2015-02-09 | Disposition: A | Discharge status: Home / Self Care | Payer: Medicaid Other | Source: Home / Self Care | Attending: Family Medicine | Admitting: Family Medicine

## 2015-02-09 ENCOUNTER — Encounter (HOSPITAL_COMMUNITY): Payer: Self-pay | Admitting: Emergency Medicine

## 2015-02-09 DIAGNOSIS — Z202 Contact with and (suspected) exposure to infections with a predominantly sexual mode of transmission: Secondary | ICD-10-CM

## 2015-02-09 DIAGNOSIS — Z113 Encounter for screening for infections with a predominantly sexual mode of transmission: Secondary | ICD-10-CM | POA: Diagnosis not present

## 2015-02-09 DIAGNOSIS — N760 Acute vaginitis: Secondary | ICD-10-CM | POA: Diagnosis present

## 2015-02-09 LAB — POCT URINALYSIS DIP (DEVICE)
BILIRUBIN URINE: NEGATIVE
Glucose, UA: NEGATIVE mg/dL
Hgb urine dipstick: NEGATIVE
KETONES UR: NEGATIVE mg/dL
LEUKOCYTES UA: NEGATIVE
Nitrite: NEGATIVE
Protein, ur: NEGATIVE mg/dL
Specific Gravity, Urine: 1.025 (ref 1.005–1.030)
Urobilinogen, UA: 1 mg/dL (ref 0.0–1.0)
pH: 7 (ref 5.0–8.0)

## 2015-02-09 NOTE — Discharge Instructions (Signed)
We will call with positive test results and treat as indicated  °

## 2015-02-09 NOTE — ED Provider Notes (Signed)
CSN: [id number]     Arrival date & time 02/09/15  1624 History   First MD Initiated Contact with Patient 02/09/15 1725     Chief Complaint  Patient presents with  . Exposure to STD   (Consider location/radiation/quality/duration/timing/severity/associated sxs/prior Treatment) Patient is a 59 y.o. female presenting with vaginal discharge. The history is provided by the patient.  Vaginal Discharge Quality:  Watery Severity:  Mild Progression:  Unchanged Chronicity:  New Context comment:  Told by female friend that he had trich and may have gotten from pt, pt with no sx. Associated symptoms: no abdominal pain, no dyspareunia, no dysuria, no genital lesions, no rash, no urinary frequency and no vaginal itching   Risk factors: new sexual partner     Past Medical History  Diagnosis Date  . Jaundice   . Breast disorder     lump in left breast  . TIA (transient ischemic attack) 06/2011    "can't see real well out of left eye since; weaker on left side since"  . Hyperlipidemia   . Varicose veins   . Adjustment disorder with mixed anxiety and depressed mood 01/05/2013  . Allergy   . Anxiety   . Pain management     goes to [facility] clinic, but she doesn't intend to return   . H/O echocardiogram     not finding report in EPIC, pt. reports that it was before her appt. /w Dr. Johnsie Cancel   . Stroke 2012    TIA  . Brain aneurysm 2012    2cms.  . Complication of anesthesia     hallucinations , anxiety   . Hx of radiation therapy 07/17/13-09/03/13    left breast,60.4Gy, 32/33 fx completed  . Hypertension     "never took my RX" (07/31/2014)  . Walking pneumonia ~ 2008  . Hepatitis C dx'd ~ 2000    "specialist says it cleared itself on it's own"  . Hepatitis B dx'd ~ 1980  . Arthritis     "knees,wrists" (07/31/2014)  . Depression   . 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 Laparoscopy N/A 04/02/2013   Procedure: Diaganostic Lap.; Small Bowel Resection;  Surgeon: Harl Bowie, MD;  Location: [place];  Service: General;  Laterality: N/A;  . Colon surgery  03/2013    bowel obstruction , malignant tumor    . Vaginal births   12     birthed twins at -[redacted] weeks gestation - both survived , total of 3 pregnancies - all born vaginally  . Breast lumpectomy with sentinel lymph node biopsy Left 06/05/2013    Procedure: BREAST LUMPECTOMY WITH SENTINEL LYMPH NODE BX;  Surgeon: Harl Bowie, MD;  Location: [place];  Service: General;  Laterality: Left;  . Evacuation breast hematoma Left 06/05/2013    Procedure: EVACUATION HEMATOMA BREAST;  Surgeon: Harl Bowie, MD;  Location: [place];  Service: General;  Laterality: Left;  . Breast lumpectomy Left 04/02/2013    Procedure: Excision  of Left Breast Mass;  Surgeon: Harl Bowie, MD;  Location: [place];  Service: General;  Laterality: Left;  . Breast biopsy Left 03/2013; 05/2013  . Tumor excision  04/02/2013    "carcoid tumor removed"  . Dilation and curettage of uterus  ~ 1990    S/P miscarriage   Family History  Problem Relation Age of Onset  . Diabetes Paternal Grandfather   . Diabetes Paternal Grandmother   .  Cancer Father     lung  . Diabetes Mother   . Heart disease Mother   . Cancer Mother     cervical  . Diabetes Brother   . Stroke Brother   . Heart disease Sister   . Diabetes Sister   . Cancer Sister     cervical   History  Substance Use Topics  . Smoking status: Current Every Day Smoker -- 0.50 packs/day for 36 years    Types: Cigarettes  . Smokeless tobacco: Never Used  . Alcohol Use: 2.4 oz/week    4 Glasses of wine per week   OB History    Gravida Para Term Preterm AB TAB SAB Ectopic Multiple Living   5 4  2 1 1    4      Review of Systems  Constitutional: Negative.   Gastrointestinal: Negative.  Negative for abdominal pain.  Genitourinary: Positive for vaginal discharge. Negative for dysuria, pelvic pain and  dyspareunia.    Allergies  Statins; Ace inhibitors; Sertraline hcl; Ciprofloxacin hcl; and Tramadol  Home Medications   Prior to Admission medications   Medication Sig Start Date End Date Taking? Authorizing Provider  ALPRAZolam Duanne Moron) 0.5 MG tablet Take 1 tablet (0.5 mg total) by mouth 2 (two) times daily as needed for anxiety. 01/22/15   Nicholas Lose, MD  aspirin EC 81 MG tablet Take 81 mg by mouth daily.    Historical Provider, MD  dicyclomine (BENTYL) 10 MG capsule  10/31/14   Historical Provider, MD  dicyclomine (BENTYL) 20 MG tablet Take 1 tablet (20 mg total) by mouth 2 (two) times daily. 10/23/14   Ernestina Patches, MD  Docusate Calcium (STOOL SOFTENER PO) Take 1 tablet by mouth daily.    Historical Provider, MD  ondansetron (ZOFRAN) 4 MG tablet Take 1 tablet (4 mg total) by mouth every 6 (six) hours. 10/23/14   Ernestina Patches, MD   BP 145/85 mmHg  Pulse 71  Temp(Src) 98.1 F (36.7 C) (Oral)  Resp 16  SpO2 97% Physical Exam  Constitutional: She is oriented to person, place, and time. She appears well-developed and well-nourished.  Abdominal: Soft. Bowel sounds are normal. She exhibits no mass. There is no tenderness.  Genitourinary: Vagina normal and uterus normal. No vaginal discharge found.  Neurological: She is alert and oriented to person, place, and time.  Skin: Skin is warm and dry.  Nursing note and vitals reviewed.   ED Course  Procedures (including critical care time) Labs Review Labs Reviewed  POCT URINALYSIS DIP (DEVICE)  CERVICOVAGINAL ANCILLARY ONLY    Imaging Review No results found.   MDM   1. Possible exposure to STD        Billy Fischer, MD 02/09/15 [phone]

## 2015-02-09 NOTE — ED Notes (Signed)
Pt states that her partner told her he had "trich" and she is here to get tested for STD's

## 2015-02-10 LAB — CHROMOGRANIN A: Chromogranin A: 9 ng/mL (ref <=15)

## 2015-02-10 LAB — CERVICOVAGINAL ANCILLARY ONLY
Chlamydia: NEGATIVE
Neisseria Gonorrhea: NEGATIVE
Wet Prep (BD Affirm): NEGATIVE

## 2015-02-10 LAB — POCT PREGNANCY, URINE: Preg Test, Ur: NEGATIVE

## 2015-02-11 NOTE — Assessment & Plan Note (Signed)
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 Breast exam done 08/21/2014 was normal 2. Mammogram 09/01/2014 is normal she also had an ultrasound at the same time which was also normal

## 2015-02-11 NOTE — Assessment & Plan Note (Signed)
Status post bowel resection July 2014 at the same time that she had lumpectomy. She has a liver lesion that is being followed. She does not have any symptoms related to carcinoid syndrome and previous chromogranin A levels have all been normal.  Plan: CT chest abdomen pelvis 10/23/2014 is normal no evidence of any liver lesions.  Chromogranin A level is 9 but still in the normal range.  Severe anxiety disorder: On Xanax

## 2015-02-12 ENCOUNTER — Ambulatory Visit (HOSPITAL_BASED_OUTPATIENT_CLINIC_OR_DEPARTMENT_OTHER): Payer: Medicaid Other | Admitting: Hematology and Oncology

## 2015-02-12 ENCOUNTER — Telehealth: Payer: Self-pay | Admitting: Hematology and Oncology

## 2015-02-12 VITALS — BP 115/85 | HR 68 | Temp 97.9°F | Resp 18 | Ht 66.0 in | Wt 161.7 lb

## 2015-02-12 DIAGNOSIS — F419 Anxiety disorder, unspecified: Secondary | ICD-10-CM

## 2015-02-12 DIAGNOSIS — D0512 Intraductal carcinoma in situ of left breast: Secondary | ICD-10-CM

## 2015-02-12 DIAGNOSIS — Z170 Estrogen receptor positive status [ER+]: Secondary | ICD-10-CM | POA: Diagnosis not present

## 2015-02-12 DIAGNOSIS — F4323 Adjustment disorder with mixed anxiety and depressed mood: Secondary | ICD-10-CM

## 2015-02-12 DIAGNOSIS — C7A010 Malignant carcinoid tumor of the duodenum: Secondary | ICD-10-CM | POA: Diagnosis not present

## 2015-02-12 MED ORDER — ALPRAZOLAM 1 MG PO TABS: 1.0000 mg | ORAL_TABLET | Freq: Two times a day (BID) | ORAL | Status: DC | PRN

## 2015-02-12 MED ORDER — ALPRAZOLAM 0.5 MG PO TABS: 0.5000 mg | ORAL_TABLET | Freq: Two times a day (BID) | ORAL | Status: DC | PRN

## 2015-02-12 NOTE — Progress Notes (Signed)
Patient Care Team: Chesley Noon, MD as PCP - General (Family Medicine)  DIAGNOSIS: Ductal carcinoma in situ (DCIS) of left breast   Staging form: Breast, AJCC 7th Edition     Clinical: Stage 0 - Unsigned     Pathologic: Stage 0 (Tis (DCIS), N0, cM0) - Signed by Thea Silversmith, MD on 09/03/2013   SUMMARY OF ONCOLOGIC HISTORY:   Malignant carcinoid tumor of duodenum   04/02/2013 Initial Diagnosis Invasive well-differentiated carcinoid spanning 2 cm invades through muscularis propria into subserosal tissues, 2/5 lymph nodes positive for metastatic neuroendocrine tumor    CHIEF COMPLIANT: Intermittent diarrhea  INTERVAL HISTORY: [name] is a 59 year old with above-mentioned history of carcinoid of the duodenum and a history of breast cancer diagnosed in 2014. She complains of breast tenderness which has been there ever since surgery and may be slightly worse recently over the past 2 weeks. She has intermittent diarrhea but not too severe. We believe it may be related to irritable bowel than carcinoid.  REVIEW OF SYSTEMS:   Constitutional: Denies fevers, chills or abnormal weight loss Eyes: Denies blurriness of vision Ears, nose, mouth, throat, and face: Denies mucositis or sore throat Respiratory: Denies cough, dyspnea or wheezes Cardiovascular: Denies palpitation, chest discomfort or lower extremity swelling Gastrointestinal:  Denies nausea, heartburn or change in bowel habits Skin: Denies abnormal skin rashes Lymphatics: Denies new lymphadenopathy or easy bruising Neurological:Denies numbness, tingling or new weaknesses Behavioral/Psych: Mood is stable, no new changes  Breast: Tenderness in the left breast lateral aspect All other systems were reviewed with the patient and are negative.  I have reviewed the past medical history, past surgical history, social history and family history with the patient and they are unchanged from previous note.  ALLERGIES:  is allergic to  statins; ace inhibitors; sertraline hcl; ciprofloxacin hcl; and tramadol.  MEDICATIONS:  Current Outpatient Prescriptions  Medication Sig Dispense Refill  . ALPRAZolam (XANAX) 0.5 MG tablet Take 1 tablet (0.5 mg total) by mouth 2 (two) times daily as needed for anxiety. 45 tablet 0  . aspirin EC 81 MG tablet Take 81 mg by mouth daily.    Marland Kitchen dicyclomine (BENTYL) 20 MG tablet Take 1 tablet (20 mg total) by mouth 2 (two) times daily. 20 tablet 0  . ondansetron (ZOFRAN) 4 MG tablet Take 1 tablet (4 mg total) by mouth every 6 (six) hours. (Patient not taking: Reported on 02/12/2015) 12 tablet 0   No current facility-administered medications for this visit.    PHYSICAL EXAMINATION: ECOG PERFORMANCE STATUS: 1 - Symptomatic but completely ambulatory  Filed Vitals:   02/12/15 0806  BP: 115/85  Pulse: 68  Temp: 97.9 F (36.6 C)  Resp: 18   Filed Weights   02/12/15 0806  Weight: 161 lb 11.2 oz (73.347 kg)    GENERAL:alert, no distress and comfortable SKIN: skin color, texture, turgor are normal, no rashes or significant lesions EYES: normal, Conjunctiva are pink and non-injected, sclera clear OROPHARYNX:no exudate, no erythema and lips, buccal mucosa, and tongue normal  NECK: supple, thyroid normal size, non-tender, without nodularity LYMPH:  no palpable lymphadenopathy in the cervical, axillary or inguinal LUNGS: clear to auscultation and percussion with normal breathing effort HEART: regular rate & rhythm and no murmurs and no lower extremity edema ABDOMEN:abdomen soft, non-tender and normal bowel sounds Musculoskeletal:no cyanosis of digits and no clubbing  NEURO: alert & oriented x 3 with fluent speech, no focal motor/sensory deficits BREAST: No palpable masses or nodules in either right or left  breasts. No palpable axillary supraclavicular or infraclavicular adenopathy No nipple discharge. (exam performed in the presence of a chaperone)  LABORATORY DATA:  I have reviewed the data as  listed   Chemistry      Component Value Date/Time   NA 141 02/05/2015 0754   NA 140 10/23/2014 0906   K 4.5 02/05/2015 0754   K 4.3 10/23/2014 0906   CL 107 10/23/2014 0906   CO2 23 02/05/2015 0754   CO2 26 10/23/2014 0906   BUN 14.5 02/05/2015 0754   BUN 9 10/23/2014 0906   CREATININE 0.7 02/05/2015 0754   CREATININE 0.67 10/23/2014 0906      Component Value Date/Time   CALCIUM 8.8 02/05/2015 0754   CALCIUM 9.6 10/23/2014 0906   ALKPHOS 71 02/05/2015 0754   ALKPHOS 62 03/26/2014 1005   AST 15 02/05/2015 0754   AST 15 03/26/2014 1005   ALT 16 02/05/2015 0754   ALT 14 03/26/2014 1005   BILITOT 0.49 02/05/2015 0754   BILITOT 0.4 03/26/2014 1005       Lab Results  Component Value Date   WBC 6.1 02/05/2015   HGB 13.7 02/05/2015   HCT 41.7 02/05/2015   MCV 88.2 02/05/2015   PLT 189 02/05/2015   NEUTROABS 4.2 02/05/2015     RADIOGRAPHIC STUDIES: I have personally reviewed the radiology reports and agreed with their findings. 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 Breast exam done 02/12/2015 was normal except for tenderness in the lateral aspect of the breast going into the axilla 2. Mammogram 09/01/2014 is normal she also had an ultrasound at the same time which was also normal, she will have bilateral mammograms July 2016  Malignant carcinoid tumor of duodenum Status post bowel resection July 2014 at the same time that she had lumpectomy. She has a liver lesion that is being followed. She does not have any symptoms related to carcinoid syndrome and previous chromogranin A levels have all been normal.  Plan: CT chest abdomen pelvis 10/23/2014 is normal no evidence of any liver lesions.  Chromogranin A level is 9 but still in the normal range.  Severe  anxiety disorder: On Xanax, increase the dosage to 1 mg 45 tablets  No orders of the defined types were placed in this encounter.   The patient has a good understanding of the overall plan. she agrees with it. she will call with any problems that may develop before the next visit here.   Rulon Eisenmenger, MD

## 2015-02-12 NOTE — ED Notes (Signed)
Patient here to review test results. After verifying ID, discussed negative reports

## 2015-02-12 NOTE — Telephone Encounter (Signed)
Appointments made and avs printed for patient °

## 2015-02-23 ENCOUNTER — Other Ambulatory Visit: Payer: Self-pay | Admitting: Hematology and Oncology

## 2015-02-23 DIAGNOSIS — Z9889 Other specified postprocedural states: Secondary | ICD-10-CM

## 2015-02-23 DIAGNOSIS — Z853 Personal history of malignant neoplasm of breast: Secondary | ICD-10-CM

## 2015-03-12 ENCOUNTER — Other Ambulatory Visit: Payer: Self-pay | Admitting: *Deleted

## 2015-03-12 DIAGNOSIS — F4323 Adjustment disorder with mixed anxiety and depressed mood: Secondary | ICD-10-CM

## 2015-03-12 DIAGNOSIS — C7A010 Malignant carcinoid tumor of the duodenum: Secondary | ICD-10-CM

## 2015-03-12 MED ORDER — ALPRAZOLAM 1 MG PO TABS: 1.0000 mg | ORAL_TABLET | Freq: Two times a day (BID) | ORAL | Status: DC | PRN

## 2015-03-12 NOTE — Telephone Encounter (Signed)
PT. IS ASKING FOR AN EARLY REFILL ON HER XANAX. SHE IS LEAVING TODAY AT 7:00PM TO GO OUT OF TOWN AND WILL NOT RETURN UNTIL 03/23/15. PHARMACY IS [facility] [address].

## 2015-03-12 NOTE — Telephone Encounter (Signed)
Let pt know rx will be sent to [facility].  Pt voiced understanding.   [facility]  Sent to scan.

## 2015-03-12 NOTE — Addendum Note (Signed)
Addended by: Prentiss Bells on: 03/12/2015 11:34 AM   Modules accepted: Orders

## 2015-03-20 IMAGING — CT CT ABD-PELV W/ CM
2 of 5 series · 16 of 46 positions shown, 18 images · IV contrast (Omni 300)
Comparison: CT scan dated 03/13/2014

CLINICAL DATA: Abdominal pain and swelling. Blood in the stool.
Neuroendocrine tumor in the bowel resected in 3345.

EXAM:
CT ABDOMEN AND PELVIS WITH CONTRAST
TECHNIQUE: Multidetector CT imaging of the abdomen and pelvis was performed
using the standard protocol following bolus administration of
intravenous contrast.
CONTRAST:  100mL OMNIPAQUE IOHEXOL 300 MG/ML  SOLN

[Series 2: abd/ pelvis 5.0 i30f 1 · axial · 0.70mm/px · z∈[+1093,+1498]mm · 13 of 91 slices shown, 15 images]
[im 5/91  soft-tissue]
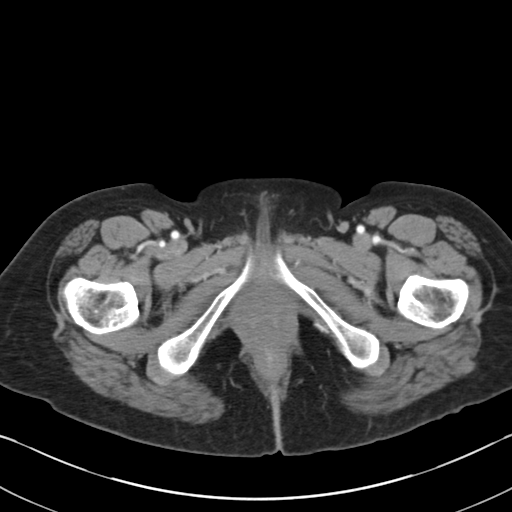
[im 5/91  bone]
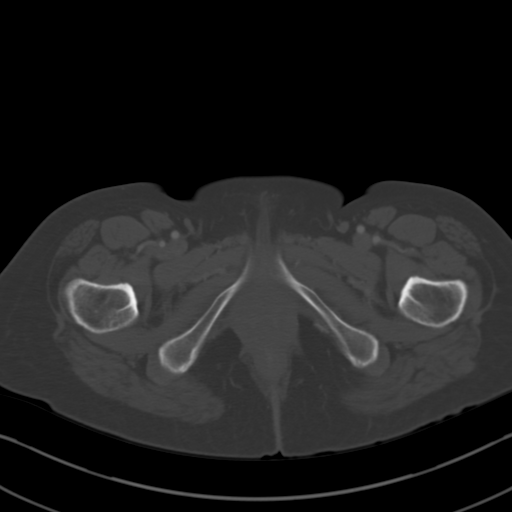
[im 15/91  soft-tissue]
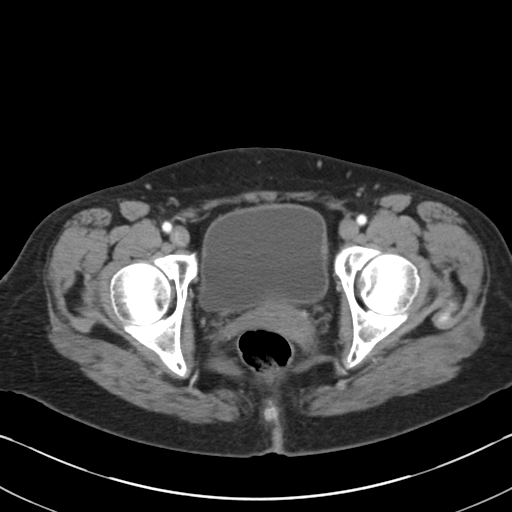
[im 19/91  soft-tissue]
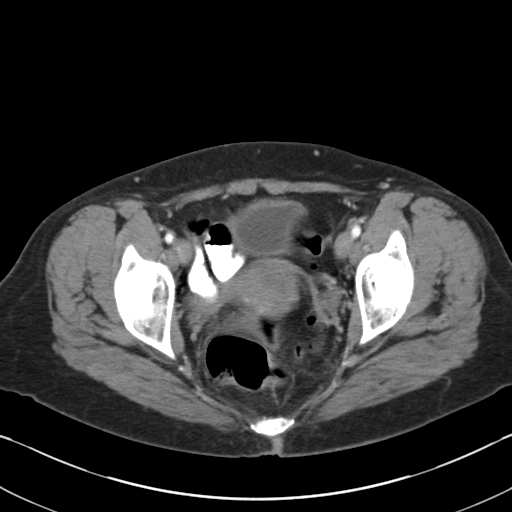
[im 24/91  soft-tissue]
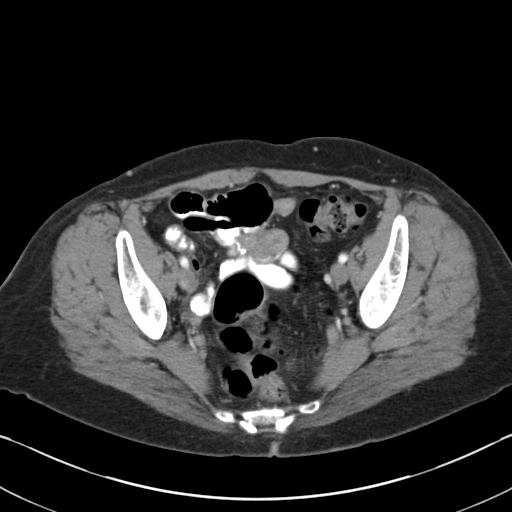
[im 34/91  soft-tissue]
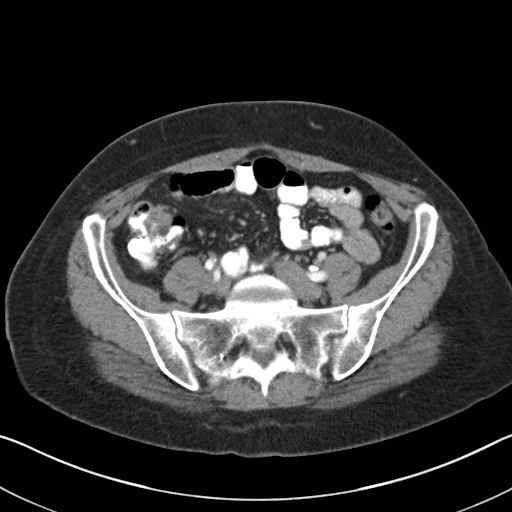
[im 38/91  soft-tissue]
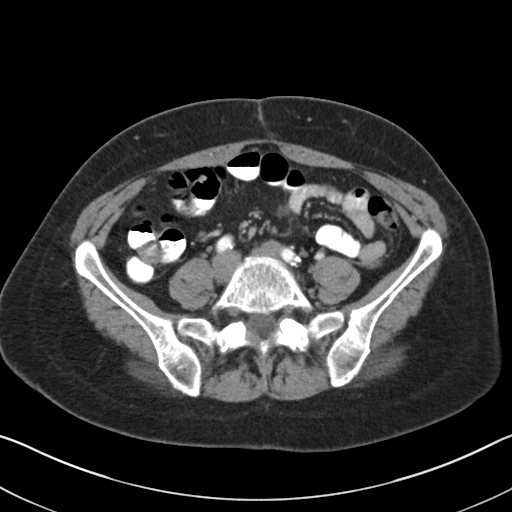
[im 48/91  soft-tissue]
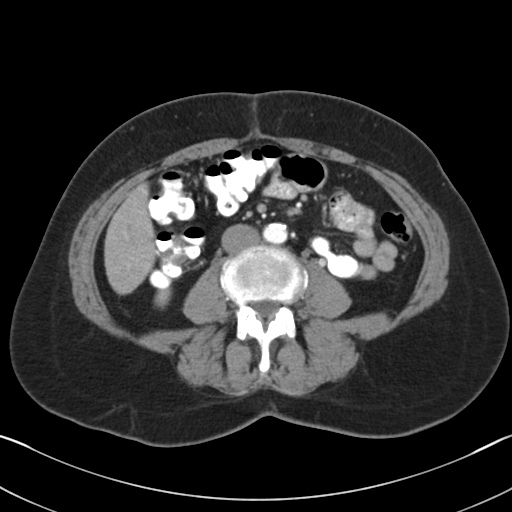
[im 53/91  soft-tissue]
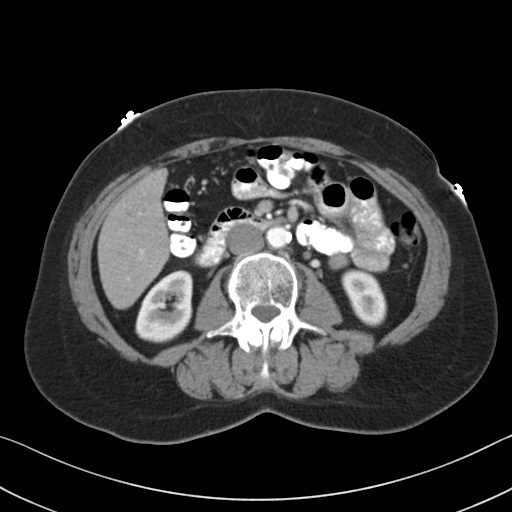
[im 57/91  soft-tissue]
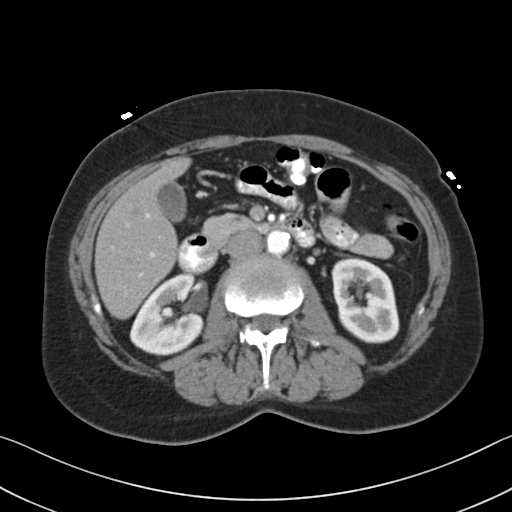
[im 57/91  bone]
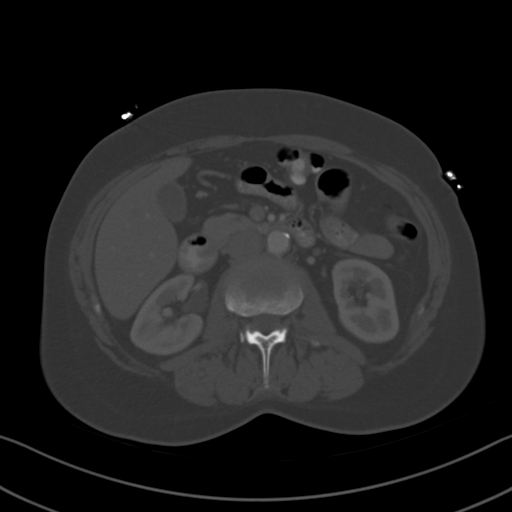
[im 67/91  soft-tissue]
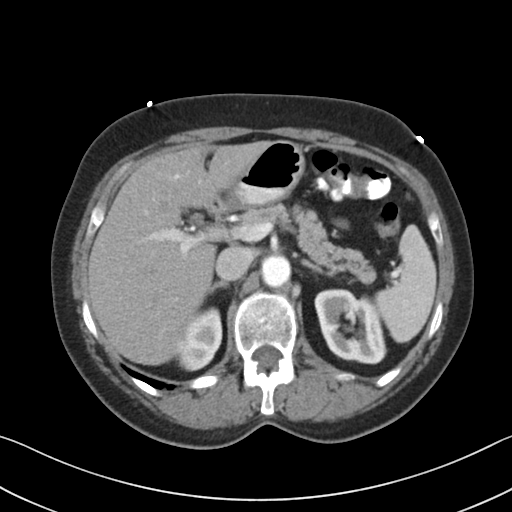
[im 72/91  soft-tissue]
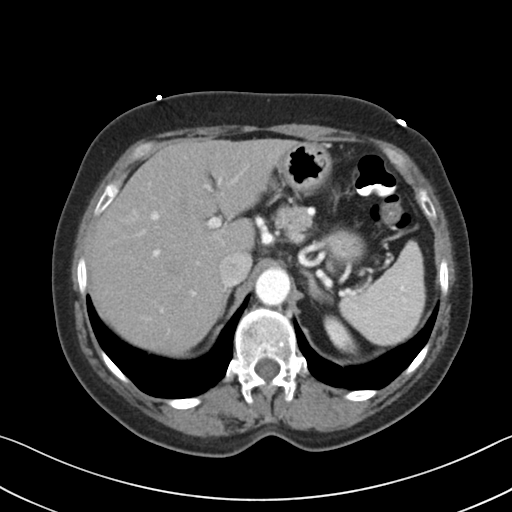
[im 76/91  soft-tissue]
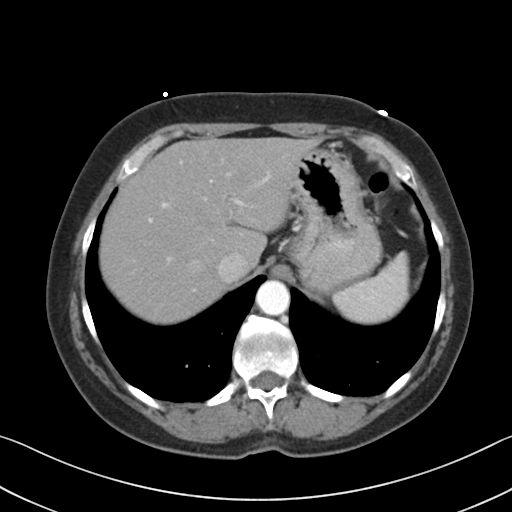
[im 86/91  soft-tissue]
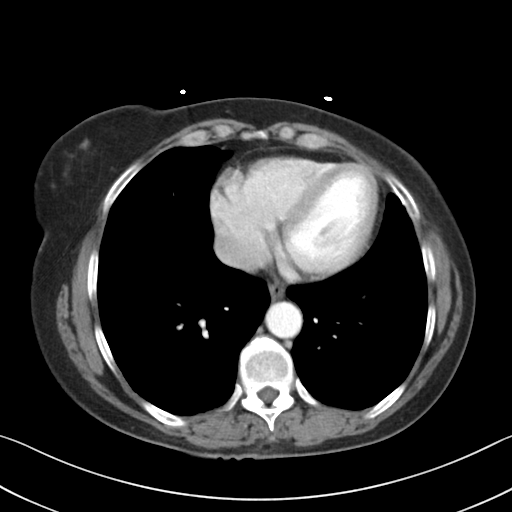

[Series 5: coronals · coronal · 0.70mm/px · 3 of 125 slices shown]
[im 42/125  soft-tissue]
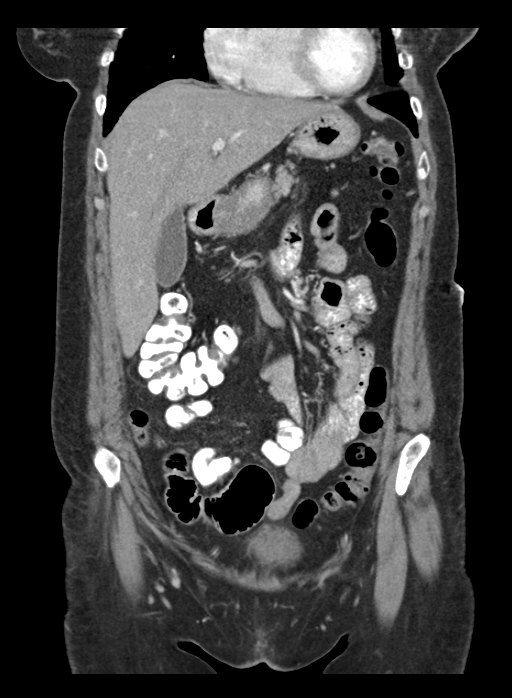
[im 56/125  soft-tissue]
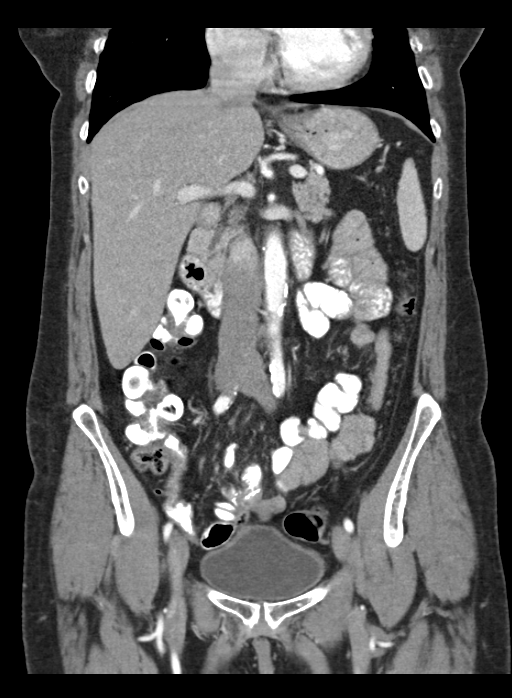
[im 69/125  soft-tissue]
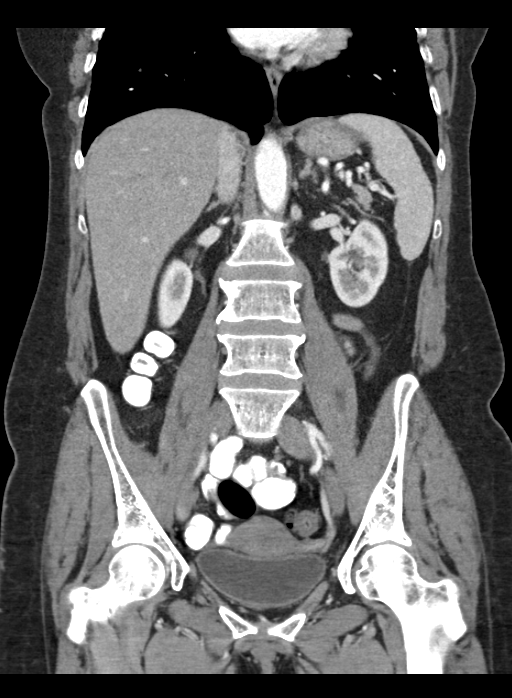

[16 of 46 positions shown; findings below may reference images not displayed]

FINDINGS: The dilatation of small bowel seen on the prior study has completely
resolved. The site of the enteroenteric anastomosis in the mid
pelvis was the site of obstruction but canal contrast is passing
through the anastomosis. There is slight prominence of the soft
tissues at the site of the anastomosis which most likely represents
postsurgical scarring. I cannot exclude a tiny area of recurrent
tumor. There was a small focus of increased activity on PET-CT at
that site on 11/26/2013.

The liver, biliary tree, spleen, pancreas, adrenal glands, and
kidneys are normal. No free air or free fluid. Appendix and terminal
ileum were normal. Uterus and ovaries and bladder are normal. No
osseous abnormality.
IMPRESSION: Complete resolution of the small bowel obstruction seen on the prior
study. There is slight prominence of soft tissues at the
enteroenteric anastomosis most likely represents scarring but I
cannot exclude a small site of recurrent tumor.

## 2015-04-05 ENCOUNTER — Other Ambulatory Visit: Payer: Self-pay

## 2015-04-05 ENCOUNTER — Telehealth: Payer: Self-pay | Admitting: *Deleted

## 2015-04-05 DIAGNOSIS — F4323 Adjustment disorder with mixed anxiety and depressed mood: Secondary | ICD-10-CM

## 2015-04-05 DIAGNOSIS — C7A010 Malignant carcinoid tumor of the duodenum: Secondary | ICD-10-CM

## 2015-04-05 MED ORDER — ALPRAZOLAM 1 MG PO TABS: 1.0000 mg | ORAL_TABLET | Freq: Two times a day (BID) | ORAL | Status: DC | PRN

## 2015-04-05 NOTE — Telephone Encounter (Signed)
[name] called requesting alprazalam.  [facility] [address]  Return number [phone].  Last order was 03-12-2015 for forty five.

## 2015-04-07 NOTE — Telephone Encounter (Signed)
Observed refill by [facility] on 04-05-2015.

## 2015-04-14 ENCOUNTER — Ambulatory Visit
Admission: RE | Admit: 2015-04-14 | Discharge: 2015-04-14 | Disposition: A | Discharge status: Home / Self Care | Payer: Medicaid Other | Source: Ambulatory Visit | Attending: Hematology and Oncology | Admitting: Hematology and Oncology

## 2015-04-14 DIAGNOSIS — Z853 Personal history of malignant neoplasm of breast: Secondary | ICD-10-CM

## 2015-04-14 DIAGNOSIS — Z9889 Other specified postprocedural states: Secondary | ICD-10-CM

## 2015-05-10 ENCOUNTER — Other Ambulatory Visit: Payer: Self-pay | Admitting: *Deleted

## 2015-05-10 DIAGNOSIS — F4323 Adjustment disorder with mixed anxiety and depressed mood: Secondary | ICD-10-CM

## 2015-05-10 DIAGNOSIS — C7A010 Malignant carcinoid tumor of the duodenum: Secondary | ICD-10-CM

## 2015-05-10 MED ORDER — ALPRAZOLAM 1 MG PO TABS: 1.0000 mg | ORAL_TABLET | Freq: Two times a day (BID) | ORAL | Status: DC | PRN

## 2015-06-04 ENCOUNTER — Other Ambulatory Visit: Payer: Self-pay | Admitting: *Deleted

## 2015-06-04 DIAGNOSIS — C7A010 Malignant carcinoid tumor of the duodenum: Secondary | ICD-10-CM

## 2015-06-04 DIAGNOSIS — F4323 Adjustment disorder with mixed anxiety and depressed mood: Secondary | ICD-10-CM

## 2015-06-04 MED ORDER — ALPRAZOLAM 1 MG PO TABS: 1.0000 mg | ORAL_TABLET | Freq: Two times a day (BID) | ORAL | Status: AC | PRN

## 2015-06-04 NOTE — Telephone Encounter (Signed)
Refilled xanax prescription. Patient notified and verbalized understanding.

## 2015-06-25 ENCOUNTER — Emergency Department (HOSPITAL_COMMUNITY)
Admission: EM | Admit: 2015-06-25 | Discharge: 2015-06-25 | Disposition: A | Discharge status: Home / Self Care | Payer: Medicaid Other | Attending: Emergency Medicine | Admitting: Emergency Medicine

## 2015-06-25 ENCOUNTER — Encounter (HOSPITAL_COMMUNITY): Payer: Self-pay | Admitting: Nurse Practitioner

## 2015-06-25 ENCOUNTER — Emergency Department (HOSPITAL_COMMUNITY): Payer: Medicaid Other

## 2015-06-25 DIAGNOSIS — Z7982 Long term (current) use of aspirin: Secondary | ICD-10-CM | POA: Insufficient documentation

## 2015-06-25 DIAGNOSIS — R109 Unspecified abdominal pain: Secondary | ICD-10-CM | POA: Diagnosis present

## 2015-06-25 DIAGNOSIS — R1032 Left lower quadrant pain: Secondary | ICD-10-CM | POA: Insufficient documentation

## 2015-06-25 DIAGNOSIS — Z720 Tobacco use: Secondary | ICD-10-CM | POA: Insufficient documentation

## 2015-06-25 DIAGNOSIS — Z8673 Personal history of transient ischemic attack (TIA), and cerebral infarction without residual deficits: Secondary | ICD-10-CM | POA: Diagnosis not present

## 2015-06-25 DIAGNOSIS — I10 Essential (primary) hypertension: Secondary | ICD-10-CM | POA: Insufficient documentation

## 2015-06-25 DIAGNOSIS — M545 Low back pain: Secondary | ICD-10-CM | POA: Diagnosis not present

## 2015-06-25 DIAGNOSIS — Z8701 Personal history of pneumonia (recurrent): Secondary | ICD-10-CM | POA: Insufficient documentation

## 2015-06-25 DIAGNOSIS — Z8619 Personal history of other infectious and parasitic diseases: Secondary | ICD-10-CM | POA: Diagnosis not present

## 2015-06-25 DIAGNOSIS — R1012 Left upper quadrant pain: Secondary | ICD-10-CM | POA: Diagnosis not present

## 2015-06-25 DIAGNOSIS — M1990 Unspecified osteoarthritis, unspecified site: Secondary | ICD-10-CM | POA: Diagnosis not present

## 2015-06-25 DIAGNOSIS — Z8659 Personal history of other mental and behavioral disorders: Secondary | ICD-10-CM | POA: Diagnosis not present

## 2015-06-25 HISTORY — DX: Benign carcinoid tumor of unspecified site: D3A.00

## 2015-06-25 LAB — LIPASE, BLOOD: Lipase: 40 U/L (ref 22–51)

## 2015-06-25 LAB — COMPREHENSIVE METABOLIC PANEL
ALT: 16 U/L (ref 14–54)
ANION GAP: 7 (ref 5–15)
AST: 17 U/L (ref 15–41)
Albumin: 3.9 g/dL (ref 3.5–5.0)
Alkaline Phosphatase: 64 U/L (ref 38–126)
BUN: 10 mg/dL (ref 6–20)
CO2: 27 mmol/L (ref 22–32)
CREATININE: 0.74 mg/dL (ref 0.44–1.00)
Calcium: 9.4 mg/dL (ref 8.9–10.3)
Chloride: 104 mmol/L (ref 101–111)
GFR calc Af Amer: >60 mL/min (ref >60)
GFR calc non Af Amer: >60 mL/min (ref >60)
Glucose, Bld: 102 mg/dL — ABNORMAL HIGH (ref 65–99)
Potassium: 4.4 mmol/L (ref 3.5–5.1)
Sodium: 138 mmol/L (ref 135–145)
Total Bilirubin: 0.7 mg/dL (ref 0.3–1.2)
Total Protein: 7.3 g/dL (ref 6.5–8.1)

## 2015-06-25 LAB — CBC
HEMATOCRIT: 41.8 % (ref 36.0–46.0)
HEMOGLOBIN: 13.6 g/dL (ref 12.0–15.0)
MCH: 28.9 pg (ref 26.0–34.0)
MCHC: 32.5 g/dL (ref 30.0–36.0)
MCV: 88.9 fL (ref 78.0–100.0)
Platelets: 230 10*3/uL (ref 150–400)
RBC: 4.7 MIL/uL (ref 3.87–5.11)
RDW: 13.1 % (ref 11.5–15.5)
WBC: 6.3 10*3/uL (ref 4.0–10.5)

## 2015-06-25 LAB — URINALYSIS, ROUTINE W REFLEX MICROSCOPIC
BILIRUBIN URINE: NEGATIVE
Glucose, UA: NEGATIVE mg/dL
Hgb urine dipstick: NEGATIVE
Ketones, ur: NEGATIVE mg/dL
Leukocytes, UA: NEGATIVE
NITRITE: NEGATIVE
PH: 5 (ref 5.0–8.0)
Protein, ur: NEGATIVE mg/dL
SPECIFIC GRAVITY, URINE: 1.022 (ref 1.005–1.030)
Urobilinogen, UA: 0.2 mg/dL (ref 0.0–1.0)

## 2015-06-25 LAB — I-STAT CG4 LACTIC ACID, ED
LACTIC ACID, VENOUS: 0.81 mmol/L (ref 0.5–2.0)
Lactic Acid, Venous: 1.31 mmol/L (ref 0.5–2.0)

## 2015-06-25 LAB — TROPONIN I: Troponin I: <0.03 ng/mL (ref <0.031)

## 2015-06-25 LAB — I-STAT TROPONIN, ED: TROPONIN I, POC: 0 ng/mL (ref 0.00–0.08)

## 2015-06-25 LAB — POC OCCULT BLOOD, ED: FECAL OCCULT BLD: NEGATIVE

## 2015-06-25 MED ORDER — MORPHINE SULFATE (PF) 4 MG/ML IV SOLN
4.0000 mg | Freq: Once | INTRAVENOUS | Status: AC
  Administered 2015-06-25: 4 mg via INTRAVENOUS
  Filled 2015-06-25: qty 1

## 2015-06-25 MED ORDER — DICYCLOMINE HCL 20 MG PO TABS: 20.0000 mg | ORAL_TABLET | Freq: Two times a day (BID) | ORAL | Status: DC

## 2015-06-25 MED ORDER — HYDROMORPHONE HCL 1 MG/ML IJ SOLN
1.0000 mg | Freq: Once | INTRAMUSCULAR | Status: AC
  Administered 2015-06-25: 1 mg via INTRAVENOUS
  Filled 2015-06-25: qty 1

## 2015-06-25 MED ORDER — OMEPRAZOLE 20 MG PO CPDR: 20.0000 mg | DELAYED_RELEASE_CAPSULE | Freq: Every day | ORAL | Status: DC

## 2015-06-25 MED ORDER — DIPHENHYDRAMINE HCL 50 MG/ML IJ SOLN
25.0000 mg | Freq: Once | INTRAMUSCULAR | Status: AC
  Administered 2015-06-25: 25 mg via INTRAVENOUS
  Filled 2015-06-25: qty 1

## 2015-06-25 MED ORDER — SODIUM CHLORIDE 0.9 % IV BOLUS (SEPSIS)
1000.0000 mL | Freq: Once | INTRAVENOUS | Status: AC
  Administered 2015-06-25: 1000 mL via INTRAVENOUS

## 2015-06-25 MED ORDER — ONDANSETRON HCL 4 MG/2ML IJ SOLN
4.0000 mg | Freq: Once | INTRAMUSCULAR | Status: AC
  Administered 2015-06-25: 4 mg via INTRAVENOUS
  Filled 2015-06-25: qty 2

## 2015-06-25 MED ORDER — IOHEXOL 300 MG/ML  SOLN
100.0000 mL | Freq: Once | INTRAMUSCULAR | Status: AC | PRN
Start: 2015-06-25 — End: 2015-06-25
  Administered 2015-06-25: 100 mL via INTRAVENOUS

## 2015-06-25 MED ORDER — HYDROCODONE-ACETAMINOPHEN 5-325 MG PO TABS
1.0000 | ORAL_TABLET | Freq: Once | ORAL | Status: AC
  Administered 2015-06-25: 1 via ORAL
  Filled 2015-06-25: qty 1

## 2015-06-25 NOTE — Discharge Instructions (Signed)
Abdominal Pain, Adult Follow-up with your doctor and stomach doctor. Return to the ED if you develop new or worsening symptoms. Many things can cause abdominal pain. Usually, abdominal pain is not caused by a disease and will improve without treatment. It can often be observed and treated at home. Your health care provider will do a physical exam and possibly order blood tests and X-rays to help determine the seriousness of your pain. However, in many cases, more time must pass before a clear cause of the pain can be found. Before that point, your health care provider may not know if you need more testing or further treatment. HOME CARE INSTRUCTIONS Monitor your abdominal pain for any changes. The following actions may help to alleviate any discomfort you are experiencing:  Only take over-the-counter or prescription medicines as directed by your health care provider.  Do not take laxatives unless directed to do so by your health care provider.  Try a clear liquid diet (broth, tea, or water) as directed by your health care provider. Slowly move to a bland diet as tolerated. SEEK MEDICAL CARE IF:  You have unexplained abdominal pain.  You have abdominal pain associated with nausea or diarrhea.  You have pain when you urinate or have a bowel movement.  You experience abdominal pain that wakes you in the night.  You have abdominal pain that is worsened or improved by eating food.  You have abdominal pain that is worsened with eating fatty foods.  You have a fever. SEEK IMMEDIATE MEDICAL CARE IF:  Your pain does not go away within 2 hours.  You keep throwing up (vomiting).  Your pain is felt only in portions of the abdomen, such as the right side or the left lower portion of the abdomen.  You pass bloody or black tarry stools. MAKE SURE YOU:  Understand these instructions.  Will watch your condition.  Will get help right away if you are not doing well or get worse.   This  information is not intended to replace advice given to you by your health care provider. Make sure you discuss any questions you have with your health care provider.   Document Released: 06/07/2005 Document Revised: 05/19/2015 Document Reviewed: 05/07/2013 Elsevier Interactive Patient Education [employer].

## 2015-06-25 NOTE — ED Notes (Signed)
She reports L sided abd pain over past week, yesterday she felt bloated and nauseated. She drank castor oil and tried and enema last night with no relief. She noticed her stools were black last week. She is A&Ox4, resp e/u

## 2015-06-25 NOTE — ED Provider Notes (Signed)
CSN: [id number]     Arrival date & time 06/25/15  1224 History   First MD Initiated Contact with Patient 06/25/15 1557     Chief Complaint  Patient presents with  . Abdominal Pain     (Consider location/radiation/quality/duration/timing/severity/associated sxs/prior Treatment) HPI Comments: Patient presents with ongoing left-sided pain constant for the past week that waxes and wanes in intensity. Associated with nausea with no vomiting. She drank castor oil because she thought she was constipated and has had some diarrhea since. Her stools are black last week they have been yellow and brown. She denies any fever or vomiting. Pain is similar to when she had her carcinoid tumor removed 2 years ago. No other abdominal surgeries. No dysuria or hematuria. patient was seen in February for similar pain and which was attributed IBS by GI. No chest pain or SOB.  States she was told that her hepatitis "had resolved".  Saw PCP yesterday and was referred to the ED. patient also reports a constant history of 15 years of left upper quadrant pain that waxes and wanes in intensity. This is different pain she is experiencing today. It radiates to her lower back.   The history is provided by the patient. The history is limited by the condition of the patient.    Past Medical History  Diagnosis Date  . Jaundice   . Breast disorder     lump in left breast  . TIA (transient ischemic attack) 06/2011    "can't see real well out of left eye since; weaker on left side since"  . Hyperlipidemia   . Varicose veins   . Adjustment disorder with mixed anxiety and depressed mood 01/05/2013  . Allergy   . Anxiety   . Pain management     goes to [facility] clinic, but she doesn't intend to return   . H/O echocardiogram     not finding report in EPIC, pt. reports that it was before her appt. /w Dr. Johnsie Cancel   . Stroke [facility]) 2012    TIA  . Brain aneurysm 2012    2cms.  . Complication of anesthesia     hallucinations ,  anxiety   . Hx of radiation therapy 07/17/13-09/03/13    left breast,60.4Gy, 32/33 fx completed  . Hypertension     "never took my RX" (07/31/2014)  . Walking pneumonia ~ 2008  . Hepatitis C dx'd ~ 2000    "specialist says it cleared itself on it's own"  . Hepatitis B dx'd ~ 1980  . Arthritis     "knees,wrists" (07/31/2014)  . Depression   . Carcinoid tumor   . 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 Laparoscopy N/A 04/02/2013    Procedure: Diaganostic Lap.; Small Bowel Resection;  Surgeon: Harl Bowie, MD;  Location: [place];  Service: General;  Laterality: N/A;  . Colon surgery  03/2013    bowel obstruction , malignant tumor    . Vaginal births   34     birthed twins at -[redacted] weeks gestation - both survived , total of 3 pregnancies - all born vaginally  . Breast lumpectomy with sentinel lymph node biopsy Left 06/05/2013    Procedure: BREAST LUMPECTOMY WITH SENTINEL LYMPH NODE BX;  Surgeon: Harl Bowie, MD;  Location: [place];  Service: General;  Laterality: Left;  . Evacuation breast hematoma Left 06/05/2013    Procedure: EVACUATION HEMATOMA BREAST;  Surgeon: Nathaneil Canary  Lynann Beaver, MD;  Location: [place];  Service: General;  Laterality: Left;  . Breast lumpectomy Left 04/02/2013    Procedure: Excision  of Left Breast Mass;  Surgeon: Harl Bowie, MD;  Location: [place];  Service: General;  Laterality: Left;  . Breast biopsy Left 03/2013; 05/2013  . Tumor excision  04/02/2013    "carcoid tumor removed"  . Dilation and curettage of uterus  ~ 1990    S/P miscarriage   Family History  Problem Relation Age of Onset  . Diabetes Paternal Grandfather   . Diabetes Paternal Grandmother   . Cancer Father     lung  . Diabetes Mother   . Heart disease Mother   . Cancer Mother     cervical  . Diabetes Brother   . Stroke Brother   . Heart disease Sister   . Diabetes Sister   . Cancer Sister      cervical   Social History  Substance Use Topics  . Smoking status: Current Every Day Smoker -- 0.50 packs/day for 36 years    Types: Cigarettes  . Smokeless tobacco: Never Used  . Alcohol Use: 2.4 oz/week    4 Glasses of wine per week   OB History    Gravida Para Term Preterm AB TAB SAB Ectopic Multiple Living   5 4  2 1 1    4      Review of Systems  Constitutional: Positive for activity change and appetite change. Negative for fever.  HENT: Negative for congestion and rhinorrhea.   Respiratory: Negative for cough, chest tightness and shortness of breath.   Cardiovascular: Negative for chest pain.  Gastrointestinal: Positive for nausea, abdominal pain and blood in stool. Negative for vomiting.  Genitourinary: Negative for dysuria, hematuria, vaginal bleeding and vaginal discharge.  Musculoskeletal: Positive for back pain. Negative for myalgias and arthralgias.  Skin: Negative for rash.  Neurological: Negative for dizziness, weakness and headaches.  A complete 10 system review of systems was obtained and all systems are negative except as noted in the HPI and PMH.      Allergies  Statins; Ace inhibitors; Sertraline hcl; Ciprofloxacin hcl; and Tramadol  Home Medications   Prior to Admission medications   Medication Sig Start Date End Date Taking? Authorizing Provider  aspirin EC 81 MG tablet Take 81 mg by mouth daily.   Yes Historical Provider, MD  ALPRAZolam Duanne Moron) 1 MG tablet Take 1 tablet (1 mg total) by mouth 2 (two) times daily as needed for anxiety. Patient not taking: Reported on 06/25/2015 06/04/15   Nicholas Lose, MD  dicyclomine (BENTYL) 20 MG tablet Take 1 tablet (20 mg total) by mouth 2 (two) times daily. 06/25/15   Ezequiel Essex, MD  omeprazole (PRILOSEC) 20 MG capsule Take 1 capsule (20 mg total) by mouth daily. 06/25/15   Ezequiel Essex, MD  ondansetron (ZOFRAN) 4 MG tablet Take 1 tablet (4 mg total) by mouth every 6 (six) hours. Patient not taking:  Reported on 02/12/2015 10/23/14   Ernestina Patches, MD   BP 118/64 mmHg  Pulse 53  Temp(Src) 97.6 F (36.4 C) (Oral)  Resp 14  Ht 5\' 6"  (1.676 m)  Wt 164 lb (74.39 kg)  BMI 26.48 kg/m2  SpO2 96% Physical Exam  Constitutional: She is oriented to person, place, and time. She appears well-developed and well-nourished. No distress.  HENT:  Head: Normocephalic and atraumatic.  Mouth/Throat: Oropharynx is clear and moist. No oropharyngeal exudate.  Eyes: Conjunctivae and EOM are normal. Pupils  are equal, round, and reactive to light.  Neck: Normal range of motion. Neck supple.  No meningismus.  Cardiovascular: Normal rate, regular rhythm, normal heart sounds and intact distal pulses.   No murmur heard. Pulmonary/Chest: Effort normal and breath sounds normal. No respiratory distress.  Abdominal: Soft. There is tenderness. There is no rebound and no guarding.  Tenderness to left upper quadrant and left lower quadrant. Mild abdominal distention, no fluid wave. No peritoneal signs.  Genitourinary:  Chaperone present, no gross blood, no external hemorrhoids, minimal stool obtained.  Musculoskeletal: Normal range of motion. She exhibits no edema or tenderness.  No CVA tenderness  Neurological: She is alert and oriented to person, place, and time. No cranial nerve deficit. She exhibits normal muscle tone. Coordination normal.  No ataxia on finger to nose bilaterally. No pronator drift. 5/5 strength throughout. CN 2-12 intact. Negative Romberg. Equal grip strength. Sensation intact. Gait is normal.   Skin: Skin is warm.  Psychiatric: She has a normal mood and affect. Her behavior is normal.  Nursing note and vitals reviewed.   ED Course  Procedures (including critical care time) Labs Review Labs Reviewed  COMPREHENSIVE METABOLIC PANEL - Abnormal; Notable for the following:    Glucose, Bld 102 (*)    All other components within normal limits  CBC  URINALYSIS, ROUTINE W REFLEX MICROSCOPIC (NOT  AT [facility])  LIPASE, BLOOD  TROPONIN I  POC OCCULT BLOOD, ED  I-STAT CG4 LACTIC ACID, ED  I-STAT CG4 LACTIC ACID, ED  I-STAT TROPOININ, ED    Imaging Review Ct Abdomen Pelvis W Contrast  06/25/2015  CLINICAL DATA:  59 year old female with acute left-sided abdominal and pelvic pain with nausea for 1 week. EXAM: CT ABDOMEN AND PELVIS WITH CONTRAST TECHNIQUE: Multidetector CT imaging of the abdomen and pelvis was performed using the standard protocol following bolus administration of intravenous contrast. CONTRAST:  119mL OMNIPAQUE IOHEXOL 300 MG/ML  SOLN COMPARISON:  10/23/2014 and prior CTs FINDINGS: Lower chest:  No acute abnormality. Hepatobiliary: A 1.8 x 2 cm enhancing lesion within the right liver (image 21) is stable. The liver is otherwise unremarkable. The gallbladder is unremarkable. There is no evidence of biliary dilatation. Pancreas: Unremarkable Spleen: Unremarkable Adrenals/Urinary Tract: The kidneys, adrenal glands and bladder are unremarkable. Stomach/Bowel: Small bowel surgical changes are identified. There is no evidence of bowel obstruction or focal bowel wall thickening. The appendix is normal. Vascular/Lymphatic: No enlarged lymph nodes identified. Aortic atherosclerotic calcifications noted without aneurysm. Reproductive: The uterus and adnexal regions are within normal limits. Other: No free fluid, abscess or pneumoperitoneum. Musculoskeletal: No acute or suspicious abnormality. IMPRESSION: No evidence of acute abnormality. Aortic atherosclerosis. Electronically Signed   By: Margarette Canada M.D.   On: 06/25/2015 18:15   I have personally reviewed and evaluated these images and lab results as part of my medical decision-making.   EKG Interpretation   Date/Time:  Friday June 25 2015 18:53:12 EDT Ventricular Rate:  73 PR Interval:  146 QRS Duration: 107 QT Interval:  375 QTC Calculation: 413 R Axis:   -11 Text Interpretation:  Sinus rhythm Ventricular premature complex   Borderline T wave abnormalities No significant change was found Confirmed  by Wyvonnia Dusky  MD, Herberta Pickron [phone]) on 06/25/2015 7:06:34 PM      MDM   Final diagnoses:  Abdominal pain, unspecified abdominal location   Acute on chronic abdominal pain over the past week with bloating and nausea. Black stools last week which is since resolved. No vomiting or fever. No urinary  symptoms.  Abdomen is soft without peritoneal signs. Labs are unremarkable. Hemoccult is negative. Urinalysis is negative.  CT without acute abnormality.  Troponin is negative 2. Patient did have new T-wave inversion in lead 2 on her EKG.  She has not had any chest pain. Her abdominal pain has been constant over the past week and is very atypical for ACS.  Patient will be referred back to her gastroenterologist and cardiologist. She is tolerating by mouth and in no distress. We'll start PPI and continue Bentyl. Return precautions discussed.  Ezequiel Essex, MD 06/26/15 [phone]

## 2015-06-25 NOTE — ED Notes (Signed)
Pt asking how long the wait  Going outside

## 2015-07-20 ENCOUNTER — Other Ambulatory Visit: Payer: Self-pay | Admitting: Gastroenterology

## 2015-07-20 DIAGNOSIS — R1012 Left upper quadrant pain: Secondary | ICD-10-CM

## 2015-07-23 ENCOUNTER — Telehealth: Payer: Self-pay | Admitting: *Deleted

## 2015-07-23 NOTE — Telephone Encounter (Signed)
Received office notes from [facility], reviewed by Dr. Lindi Adie, sent to scan.

## 2015-07-26 ENCOUNTER — Ambulatory Visit
Admission: RE | Admit: 2015-07-26 | Discharge: 2015-07-26 | Disposition: A | Discharge status: Home / Self Care | Payer: Medicaid Other | Source: Ambulatory Visit | Attending: Gastroenterology | Admitting: Gastroenterology

## 2015-07-26 DIAGNOSIS — R1012 Left upper quadrant pain: Secondary | ICD-10-CM

## 2015-08-13 ENCOUNTER — Other Ambulatory Visit (HOSPITAL_BASED_OUTPATIENT_CLINIC_OR_DEPARTMENT_OTHER): Payer: Medicaid Other

## 2015-08-13 DIAGNOSIS — C7A010 Malignant carcinoid tumor of the duodenum: Secondary | ICD-10-CM | POA: Diagnosis present

## 2015-08-13 DIAGNOSIS — D0512 Intraductal carcinoma in situ of left breast: Secondary | ICD-10-CM

## 2015-08-13 LAB — COMPREHENSIVE METABOLIC PANEL
ALK PHOS: 68 U/L (ref 40–150)
ALT: 16 U/L (ref 0–55)
AST: 17 U/L (ref 5–34)
Albumin: 3.8 g/dL (ref 3.5–5.0)
Anion Gap: 8 mEq/L (ref 3–11)
BUN: 11.6 mg/dL (ref 7.0–26.0)
CALCIUM: 9.6 mg/dL (ref 8.4–10.4)
CHLORIDE: 108 meq/L (ref 98–109)
CO2: 26 meq/L (ref 22–29)
Creatinine: 0.8 mg/dL (ref 0.6–1.1)
EGFR: 83 mL/min/{1.73_m2} — ABNORMAL LOW (ref >90)
GLUCOSE: 95 mg/dL (ref 70–140)
POTASSIUM: 5 meq/L (ref 3.5–5.1)
SODIUM: 142 meq/L (ref 136–145)
Total Bilirubin: 0.53 mg/dL (ref 0.20–1.20)
Total Protein: 6.9 g/dL (ref 6.4–8.3)

## 2015-08-13 LAB — CBC WITH DIFFERENTIAL/PLATELET
BASO%: 0.3 % (ref 0.0–2.0)
Basophils Absolute: 0 10*3/uL (ref 0.0–0.1)
EOS%: 1.9 % (ref 0.0–7.0)
Eosinophils Absolute: 0.1 10*3/uL (ref 0.0–0.5)
HEMATOCRIT: 41.5 % (ref 34.8–46.6)
HEMOGLOBIN: 13.3 g/dL (ref 11.6–15.9)
LYMPH#: 1.2 10*3/uL (ref 0.9–3.3)
LYMPH%: 22.5 % (ref 14.0–49.7)
MCH: 28.3 pg (ref 25.1–34.0)
MCHC: 32.1 g/dL (ref 31.5–36.0)
MCV: 88 fL (ref 79.5–101.0)
MONO#: 0.4 10*3/uL (ref 0.1–0.9)
MONO%: 7.6 % (ref 0.0–14.0)
NEUT#: 3.7 10*3/uL (ref 1.5–6.5)
NEUT%: 67.7 % (ref 38.4–76.8)
Platelets: 182 10*3/uL (ref 145–400)
RBC: 4.71 10*6/uL (ref 3.70–5.45)
RDW: 13.7 % (ref 11.2–14.5)
WBC: 5.4 10*3/uL (ref 3.9–10.3)

## 2015-08-18 LAB — CHROMOGRANIN A: CHROMOGRANIN A: 11 ng/mL (ref <=15)

## 2015-08-20 ENCOUNTER — Ambulatory Visit (HOSPITAL_BASED_OUTPATIENT_CLINIC_OR_DEPARTMENT_OTHER): Payer: Medicaid Other | Admitting: Hematology and Oncology

## 2015-08-20 ENCOUNTER — Telehealth: Payer: Self-pay | Admitting: Hematology and Oncology

## 2015-08-20 ENCOUNTER — Encounter: Payer: Self-pay | Admitting: Hematology and Oncology

## 2015-08-20 VITALS — BP 137/70 | HR 66 | Temp 97.5°F | Resp 18 | Ht 66.0 in | Wt 168.0 lb

## 2015-08-20 DIAGNOSIS — D0512 Intraductal carcinoma in situ of left breast: Secondary | ICD-10-CM

## 2015-08-20 DIAGNOSIS — R160 Hepatomegaly, not elsewhere classified: Secondary | ICD-10-CM

## 2015-08-20 DIAGNOSIS — Z86000 Personal history of in-situ neoplasm of breast: Secondary | ICD-10-CM | POA: Diagnosis not present

## 2015-08-20 DIAGNOSIS — E340 Carcinoid syndrome: Secondary | ICD-10-CM | POA: Diagnosis present

## 2015-08-20 DIAGNOSIS — C7A010 Malignant carcinoid tumor of the duodenum: Secondary | ICD-10-CM | POA: Diagnosis not present

## 2015-08-20 NOTE — Progress Notes (Signed)
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 She had a CT of the belly on 06/25/2015 which reveals stable lesion in the liver and did not show any evidence of obstruction. She went to see her gastroenterologist who performed an ultrasound of abdomen which did not show any gallbladder problems. She also underwent upper endoscopy which was normal. Since the emergency room she has felt well. Her abdominal bloating and distention have resolved. Recently she had a skin infection involving the right breast where she noticed a large area of redness and she squeezed her breasts hard and drained a lot of pus out of it. She subsequently was put on antibiotic doxycycline which she is currently taking. Redness has resolved and the skin appears to have fully healed.   REVIEW OF SYSTEMS:   Constitutional: Denies fevers, chills or abnormal weight loss Eyes: Denies blurriness of vision Ears, nose, mouth, throat, and face: Denies mucositis or sore throat Respiratory: Denies cough, dyspnea or wheezes Cardiovascular: Denies palpitation, chest discomfort or lower extremity swelling Gastrointestinal:  Recent problems with abdominal bloating  distention and pain Skin: Denies abnormal skin rashes Lymphatics: Denies new lymphadenopathy or easy bruising Neurological:Denies numbness, tingling or new weaknesses Behavioral/Psych: Mood is stable, no new changes  Breast: recent problems with cellulitis and abscess involving the right breast All other systems were reviewed with the patient and are negative.  I have reviewed the past medical history, past surgical history, social history and family history with the patient and they are unchanged from previous note.  ALLERGIES:  is allergic to statins; ace inhibitors; sertraline hcl; ciprofloxacin hcl; and tramadol.  MEDICATIONS:  Current Outpatient Prescriptions  Medication Sig Dispense Refill  . ALPRAZolam (XANAX) 1 MG tablet Take 1 tablet (1 mg total) by mouth 2 (two) times daily as needed for anxiety. (Patient not taking: Reported on 06/25/2015) 45 tablet 0  . aspirin EC 81 MG tablet Take 81 mg by mouth daily.    Marland Kitchen dicyclomine (BENTYL) 20 MG tablet Take 1 tablet (20 mg total) by mouth 2 (two) times daily. 20 tablet 0  . omeprazole (PRILOSEC) 20 MG capsule Take 1 capsule (20 mg total) by mouth daily. 30 capsule 0  . ondansetron (ZOFRAN) 4 MG tablet Take 1 tablet (4 mg total) by mouth every 6 (six) hours. (Patient not taking: Reported on 02/12/2015) 12 tablet 0   No current facility-administered medications for this visit.    PHYSICAL EXAMINATION: ECOG PERFORMANCE STATUS: 1 - Symptomatic but completely ambulatory  Filed Vitals:   08/20/15 0808  BP: 137/70  Pulse: 66  Temp: 97.5 F (36.4 C)  Resp: 18   Filed Weights   08/20/15 0808  Weight: 168 lb (76.204 kg)    GENERAL:alert, no  distress and comfortable SKIN: skin color, texture, turgor are normal, no rashes or significant lesions EYES: normal, Conjunctiva are pink and non-injected, sclera clear OROPHARYNX:no exudate, no erythema and lips, buccal mucosa, and tongue normal  NECK: supple, thyroid normal size, non-tender,  without nodularity LYMPH:  no palpable lymphadenopathy in the cervical, axillary or inguinal LUNGS: clear to auscultation and percussion with normal breathing effort HEART: regular rate & rhythm and no murmurs and no lower extremity edema ABDOMEN:abdomen soft, non-tender and normal bowel sounds Musculoskeletal:no cyanosis of digits and no clubbing  NEURO: alert & oriented x 3 with fluent speech, no focal motor/sensory deficits BREAST:redness involving the right breast is much improved. There appears to be scarred out lesion in the medial lower aspect of the right breast. It appears to have fully healed. No palpable lumps and nodules in the left breast although there is tenderness in the left breast along the surgical scars.. (exam performed in the presence of a chaperone)  LABORATORY DATA:  I have reviewed the data as listed   Chemistry      Component Value Date/Time   NA 142 08/13/2015 0834   NA 138 06/25/2015 1318   K 5.0 08/13/2015 0834   K 4.4 06/25/2015 1318   CL 104 06/25/2015 1318   CO2 26 08/13/2015 0834   CO2 27 06/25/2015 1318   BUN 11.6 08/13/2015 0834   BUN 10 06/25/2015 1318   CREATININE 0.8 08/13/2015 0834   CREATININE 0.74 06/25/2015 1318      Component Value Date/Time   CALCIUM 9.6 08/13/2015 0834   CALCIUM 9.4 06/25/2015 1318   ALKPHOS 68 08/13/2015 0834   ALKPHOS 64 06/25/2015 1318   AST 17 08/13/2015 0834   AST 17 06/25/2015 1318   ALT 16 08/13/2015 0834   ALT 16 06/25/2015 1318   BILITOT 0.53 08/13/2015 0834   BILITOT 0.7 06/25/2015 1318       Lab Results  Component Value Date   WBC 5.4 08/13/2015   HGB 13.3 08/13/2015   HCT 41.5 08/13/2015   MCV 88.0 08/13/2015   PLT 182 08/13/2015   NEUTROABS 3.7 08/13/2015   ASSESSMENT & PLAN:  Ductal carcinoma in situ (DCIS) of left breast Left breast DCIS 3.5 cm ER/PR positive status post lumpectomy and radiation therapy declined tamoxifen therapy, currently on surveillance  Surveillance: 1. Breast exam  done 08/20/2015 showed redness and inflammation on the right breast from recent insect bite. She is currently on doxycycline antibiotic for this. 2. Mammogram 04/14/2015 normal breast density category B  Malignant carcinoid tumor of duodenum Status post bowel resection July 2014 at the same time that she had lumpectomy. She has a liver lesion that is being followed. She does not have any symptoms related to carcinoid syndrome and previous chromogranin A levels have all been normal.  Patient continues to have intermittent abdominal bloating discomfort and distention. She underwent a CT of the abdomen for this reason in the emergency room. She also had ultrasound of the abdomen which did not show any gallbladder problems. She had upper endoscopy by her gastroenterologist in November 2016 which did not show any abnormalities.  Plan: CT chest abdomen pelvis 06/25/2015 showed stable 1.8 x 2 cm enhancing lesion within the right lobe of the liver. Repeat CT scan in 1 year  Chromogranin A level is 11 on 08/13/2015 in the normal range.   No orders of the defined types were placed in this encounter.   The patient has a good understanding of the overall  plan. she agrees with it. she will call with any problems that may develop before the next visit here.   Rulon Eisenmenger, MD 08/20/2015

## 2015-08-20 NOTE — Telephone Encounter (Signed)
s.w. pt and advised on June appt....pt ok and aware °

## 2015-08-20 NOTE — Assessment & Plan Note (Signed)
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 Breast exam done 08/20/2015 was normal except for tenderness in the lateral aspect of the breast going into the axilla 2. Mammogram 04/14/2015 normal breast density category B

## 2015-08-20 NOTE — Assessment & Plan Note (Addendum)
Status post bowel resection July 2014 at the same time that she had lumpectomy. She has a liver lesion that is being followed. She does not have any symptoms related to carcinoid syndrome and previous chromogranin A levels have all been normal.  Plan: CT chest abdomen pelvis 10/23/2014 is normal no evidence of any liver lesions. Repeat CT scan in 6 months in follow-up  Chromogranin A level is 11 on 08/13/2015 in the normal range.

## 2015-08-28 ENCOUNTER — Emergency Department (HOSPITAL_COMMUNITY)
Admission: EM | Admit: 2015-08-28 | Discharge: 2015-08-28 | Discharge status: Against Medical Advice | Payer: Medicaid Other | Attending: Emergency Medicine | Admitting: Emergency Medicine

## 2015-08-28 ENCOUNTER — Encounter (HOSPITAL_COMMUNITY): Payer: Self-pay | Admitting: *Deleted

## 2015-08-28 DIAGNOSIS — H5710 Ocular pain, unspecified eye: Secondary | ICD-10-CM | POA: Insufficient documentation

## 2015-08-28 DIAGNOSIS — I10 Essential (primary) hypertension: Secondary | ICD-10-CM | POA: Insufficient documentation

## 2015-08-28 DIAGNOSIS — F17210 Nicotine dependence, cigarettes, uncomplicated: Secondary | ICD-10-CM | POA: Insufficient documentation

## 2015-08-28 MED ORDER — FLUORESCEIN SODIUM 1 MG OP STRP
1.0000 | ORAL_STRIP | Freq: Once | OPHTHALMIC | Status: AC
  Administered 2015-08-28: 1 via OPHTHALMIC
  Filled 2015-08-28: qty 1

## 2015-08-28 MED ORDER — TETRACAINE HCL 0.5 % OP SOLN
2.0000 [drp] | Freq: Once | OPHTHALMIC | Status: AC
Start: 2015-08-28 — End: 2015-08-28
  Administered 2015-08-28: 2 [drp] via OPHTHALMIC
  Filled 2015-08-28: qty 2

## 2015-08-28 NOTE — ED Provider Notes (Signed)
Pt LWBS  Margaret Bison Loxley Schmale, DO 08/28/15 [phone]

## 2015-08-28 NOTE — ED Notes (Signed)
The pt is c/o lt eye pain she woke up with the pain after decorating her christmas tree.  She thinks a clear icicle may be in it.  Red irritated with pain

## 2015-08-28 NOTE — ED Notes (Addendum)
Pt stated that she was not waiting any longer and that she wanted to leave. Pt shown down the hallway through the double doors

## 2015-08-28 NOTE — ED Notes (Signed)
Pt went to nurses desk and stated she was leaving AMA, because she was tired of waiting on the doctor.

## 2015-10-01 ENCOUNTER — Encounter: Payer: Self-pay | Admitting: *Deleted

## 2015-10-01 NOTE — Progress Notes (Signed)
Received endoscopy notes from [facility], reviewed by Dr. Lindi Adie, sent to scan.

## 2016-02-17 ENCOUNTER — Encounter: Payer: Self-pay | Admitting: Hematology and Oncology

## 2016-02-17 ENCOUNTER — Other Ambulatory Visit (HOSPITAL_BASED_OUTPATIENT_CLINIC_OR_DEPARTMENT_OTHER): Payer: Medicaid Other

## 2016-02-17 ENCOUNTER — Ambulatory Visit (HOSPITAL_BASED_OUTPATIENT_CLINIC_OR_DEPARTMENT_OTHER): Payer: Medicaid Other | Admitting: Hematology and Oncology

## 2016-02-17 ENCOUNTER — Telehealth: Payer: Self-pay | Admitting: Hematology and Oncology

## 2016-02-17 VITALS — BP 154/79 | HR 65 | Temp 97.6°F | Resp 18 | Ht 66.0 in | Wt 167.4 lb

## 2016-02-17 DIAGNOSIS — Z85060 Personal history of malignant carcinoid tumor of small intestine: Secondary | ICD-10-CM | POA: Diagnosis not present

## 2016-02-17 DIAGNOSIS — Z86000 Personal history of in-situ neoplasm of breast: Secondary | ICD-10-CM | POA: Diagnosis present

## 2016-02-17 DIAGNOSIS — C7A010 Malignant carcinoid tumor of the duodenum: Secondary | ICD-10-CM

## 2016-02-17 DIAGNOSIS — D0512 Intraductal carcinoma in situ of left breast: Secondary | ICD-10-CM

## 2016-02-17 LAB — CBC WITH DIFFERENTIAL/PLATELET
BASO%: 0.1 % (ref 0.0–2.0)
Basophils Absolute: 0 10*3/uL (ref 0.0–0.1)
EOS%: 1.4 % (ref 0.0–7.0)
Eosinophils Absolute: 0.1 10*3/uL (ref 0.0–0.5)
HEMATOCRIT: 39.9 % (ref 34.8–46.6)
HGB: 12.9 g/dL (ref 11.6–15.9)
LYMPH#: 1.3 10*3/uL (ref 0.9–3.3)
LYMPH%: 18.6 % (ref 14.0–49.7)
MCH: 28.9 pg (ref 25.1–34.0)
MCHC: 32.3 g/dL (ref 31.5–36.0)
MCV: 89.5 fL (ref 79.5–101.0)
MONO#: 0.4 10*3/uL (ref 0.1–0.9)
MONO%: 5.5 % (ref 0.0–14.0)
NEUT%: 74.4 % (ref 38.4–76.8)
NEUTROS ABS: 5.3 10*3/uL (ref 1.5–6.5)
PLATELETS: 213 10*3/uL (ref 145–400)
RBC: 4.46 10*6/uL (ref 3.70–5.45)
RDW: 13.6 % (ref 11.2–14.5)
WBC: 7.1 10*3/uL (ref 3.9–10.3)

## 2016-02-17 LAB — COMPREHENSIVE METABOLIC PANEL
ALT: 13 U/L (ref 0–55)
AST: 11 U/L (ref 5–34)
Albumin: 3.5 g/dL (ref 3.5–5.0)
Alkaline Phosphatase: 63 U/L (ref 40–150)
Anion Gap: 8 mEq/L (ref 3–11)
BUN: 18.7 mg/dL (ref 7.0–26.0)
CHLORIDE: 110 meq/L — AB (ref 98–109)
CO2: 23 meq/L (ref 22–29)
CREATININE: 0.9 mg/dL (ref 0.6–1.1)
Calcium: 9.1 mg/dL (ref 8.4–10.4)
EGFR: 75 mL/min/{1.73_m2} — ABNORMAL LOW (ref >90)
Glucose: 92 mg/dl (ref 70–140)
Potassium: 4.2 mEq/L (ref 3.5–5.1)
Sodium: 142 mEq/L (ref 136–145)
TOTAL PROTEIN: 6.6 g/dL (ref 6.4–8.3)

## 2016-02-17 NOTE — Assessment & Plan Note (Addendum)
Status post bowel resection July 2014 at the same time that she had lumpectomy. She has a liver lesion that is being followed. She does not have any symptoms related to carcinoid syndrome and previous chromogranin A levels have all been normal.  Patient continues to have intermittent abdominal bloating discomfort and distention. She underwent a CT of the abdomen for this reason in the emergency room. She also had ultrasound of the abdomen which did not show any gallbladder problems. She had upper endoscopy by her gastroenterologist in November 2016 which did not show any abnormalities.  Plan: CT chest abdomen pelvis 06/25/2015 showed stable 1.8 x 2 cm enhancing lesion within the right lobe of the liver. Repeat CT scan in 1 year will be planned in December 2017  Chromogranin A level is 11 on 08/13/2015 in the normal range. Follow-up in 6 months

## 2016-02-17 NOTE — Progress Notes (Signed)
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 She also had history of DCIS left breast and she refused tamoxifen therapy. She is currently on surveillance. She reports recent upper endoscopy was normal. Since that time her belly is feeling much better. She does not have any bloating sensation or nausea or discomfort.  REVIEW OF SYSTEMS:   Constitutional: Denies fevers, chills or abnormal weight loss Eyes: Denies blurriness of vision Ears, nose, mouth, throat, and face: Denies mucositis or sore throat Respiratory: Denies cough, dyspnea or wheezes Cardiovascular: Denies palpitation, chest discomfort Gastrointestinal:  Denies nausea, heartburn or change in bowel habits Skin: Denies abnormal skin rashes Lymphatics: Denies new lymphadenopathy or easy bruising Neurological:Denies numbness, tingling or new weaknesses Behavioral/Psych: Mood is stable, no new changes  Extremities: No lower extremity edema Breast:  denies any pain or lumps or nodules in either breasts All other systems were reviewed with the patient and are negative.  I have reviewed the past medical history, past surgical history, social history and family history with the patient and they are  unchanged from previous note.  ALLERGIES:  is allergic to statins; ace inhibitors; sertraline hcl; ciprofloxacin hcl; and tramadol.  MEDICATIONS:  Current Outpatient Prescriptions  Medication Sig Dispense Refill  . ALPRAZolam (XANAX) 1 MG tablet Take 1 tablet (1 mg total) by mouth 2 (two) times daily as needed for anxiety. 45 tablet 0  . dicyclomine (BENTYL) 20 MG tablet Take 1 tablet (20 mg total) by mouth 2 (two) times daily. (Patient not taking: Reported on 08/28/2015) 20 tablet 0  . omeprazole (PRILOSEC) 20 MG capsule Take 1 capsule (20 mg total) by mouth daily. (Patient not taking: Reported on 08/28/2015) 30 capsule 0  . ondansetron (ZOFRAN) 4 MG tablet Take 1 tablet (4 mg total) by mouth every 6 (six) hours. (Patient not taking: Reported on 02/12/2015) 12 tablet 0   No current facility-administered medications for this visit.    PHYSICAL EXAMINATION: ECOG PERFORMANCE STATUS: 1 - Symptomatic but completely ambulatory  Filed Vitals:   02/17/16 0829  BP: 154/79  Pulse: 65  Temp: 97.6 F (36.4 C)  Resp: 18   Filed Weights   02/17/16 0829  Weight: 167 lb 6.4 oz (75.932 kg)    GENERAL:alert, no distress and comfortable SKIN: skin color, texture, turgor are normal, no rashes or significant lesions EYES: normal, Conjunctiva are pink and non-injected, sclera clear OROPHARYNX:no exudate, no erythema and lips, buccal mucosa, and tongue normal  NECK: supple, thyroid normal size, non-tender, without nodularity LYMPH:  no palpable lymphadenopathy in the cervical, axillary or inguinal LUNGS: clear to auscultation and percussion with normal breathing effort HEART: regular rate & rhythm and no murmurs and no lower extremity edema ABDOMEN:abdomen soft, non-tender and normal bowel sounds MUSCULOSKELETAL:no cyanosis of digits and  no clubbing  NEURO: alert & oriented x 3 with fluent speech, no focal motor/sensory deficits EXTREMITIES: No lower extremity edema BREAST: No palpable masses or  nodules in either right or left breasts. No palpable axillary supraclavicular or infraclavicular adenopathy no breast tenderness or nipple discharge. (exam performed in the presence of a chaperone)  LABORATORY DATA:  I have reviewed the data as listed   Chemistry      Component Value Date/Time   NA 142 08/13/2015 0834   NA 138 06/25/2015 1318   K 5.0 08/13/2015 0834   K 4.4 06/25/2015 1318   CL 104 06/25/2015 1318   CO2 26 08/13/2015 0834   CO2 27 06/25/2015 1318   BUN 11.6 08/13/2015 0834   BUN 10 06/25/2015 1318   CREATININE 0.8 08/13/2015 0834   CREATININE 0.74 06/25/2015 1318      Component Value Date/Time   CALCIUM 9.6 08/13/2015 0834   CALCIUM 9.4 06/25/2015 1318   ALKPHOS 68 08/13/2015 0834   ALKPHOS 64 06/25/2015 1318   AST 17 08/13/2015 0834   AST 17 06/25/2015 1318   ALT 16 08/13/2015 0834   ALT 16 06/25/2015 1318   BILITOT 0.53 08/13/2015 0834   BILITOT 0.7 06/25/2015 1318       Lab Results  Component Value Date   WBC 7.1 02/17/2016   HGB 12.9 02/17/2016   HCT 39.9 02/17/2016   MCV 89.5 02/17/2016   PLT 213 02/17/2016   NEUTROABS 5.3 02/17/2016     ASSESSMENT & PLAN:  Ductal carcinoma in situ (DCIS) of left breast Left breast DCIS 3.5 cm ER/PR positive status post lumpectomy and radiation therapy declined tamoxifen therapy, currently on surveillance  Surveillance: 1. Breast exam done 02/17/2016 showed redness and inflammation on the right breast from recent insect bite. She is currently on doxycycline antibiotic for this. 2. Mammogram 04/14/2015 normal breast density category B  Malignant carcinoid tumor of duodenum Status post bowel resection July 2014 at the same time that she had lumpectomy. She has a liver lesion that is being followed. She does not have any symptoms related to carcinoid syndrome and previous chromogranin A levels have all been normal.  Patient continues to have intermittent abdominal bloating discomfort and distention. She  underwent a CT of the abdomen for this reason in the emergency room. She also had ultrasound of the abdomen which did not show any gallbladder problems. She had upper endoscopy by her gastroenterologist in November 2016 which did not show any abnormalities.  Plan: CT chest abdomen pelvis 06/25/2015 showed stable 1.8 x 2 cm enhancing lesion within the right lobe of the liver. This has been stable from before so we are not planning on any further CT scans unless she has symptoms.  Smoking cessation: I discussed with her about quitting smoking. She is planning on cutting down the cigarettes and then slowly discontinuing it.  Chromogranin A level is 11 on 08/13/2015 in the normal range. Follow-up in one year with CBC and CMP.   No orders of the defined types were placed in this encounter.   The patient has a good understanding of the overall plan. she agrees with it. she will call with any problems that may develop before the next visit here.   Rulon Eisenmenger, MD 02/17/2016

## 2016-02-17 NOTE — Assessment & Plan Note (Signed)
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 Breast exam done 02/17/2016 showed redness and inflammation on the right breast from recent insect bite. She is currently on doxycycline antibiotic for this. 2. Mammogram 04/14/2015 normal breast density category B

## 2016-02-17 NOTE — Telephone Encounter (Signed)
appt made and avs printed °

## 2016-02-18 LAB — CHROMOGRANIN A: Chromogranin A: 2 nmol/L (ref 0–5)

## 2016-02-23 LAB — CHROMOGRANIN A (PARALLEL TESTING): Chromogranin A: 20 ng/mL — ABNORMAL HIGH (ref <=15)

## 2016-03-27 ENCOUNTER — Other Ambulatory Visit: Payer: Self-pay | Admitting: Hematology and Oncology

## 2016-03-27 DIAGNOSIS — Z853 Personal history of malignant neoplasm of breast: Secondary | ICD-10-CM

## 2016-04-04 ENCOUNTER — Ambulatory Visit (HOSPITAL_COMMUNITY)
Admission: EM | Admit: 2016-04-04 | Discharge: 2016-04-04 | Disposition: A | Discharge status: Home / Self Care | Payer: Medicaid Other | Attending: Physician Assistant | Admitting: Physician Assistant

## 2016-04-04 ENCOUNTER — Encounter (HOSPITAL_COMMUNITY): Payer: Self-pay | Admitting: Emergency Medicine

## 2016-04-04 DIAGNOSIS — B850 Pediculosis due to Pediculus humanus capitis: Secondary | ICD-10-CM | POA: Diagnosis present

## 2016-04-04 MED ORDER — PERMETHRIN 5 % EX CREA: TOPICAL_CREAM | CUTANEOUS | 1 refills | Status: DC

## 2016-04-04 NOTE — ED Provider Notes (Signed)
CSN: [id number]     Arrival date & time 04/04/16  [id number] History   First MD Initiated Contact with Patient 04/04/16 1022     Chief Complaint  Patient presents with  . Head Lice   (Consider location/radiation/quality/duration/timing/severity/associated sxs/prior Treatment) HPI History obtained from patient:  Pt presents with the cc of:  Head lice Duration of symptoms: Couple weeks Treatment prior to arrival: Over-the-counter treatments Context: Granddaughter's have had head lice now for most of the summer have been treated twice but grandmother believes that home is infested and therefore continues to spread of infection to her house hold. Other symptoms include: None Pain score: 0 FAMILY HISTORY: Hypertension    Past Medical History:  Diagnosis Date  . Adjustment disorder with mixed anxiety and depressed mood 01/05/2013  . Allergy   . Anxiety   . Arthritis    "knees,wrists" (07/31/2014)  . Brain aneurysm 2012   2cms.  . Breast disorder    lump in left breast  . Carcinoid tumor   . Complication of anesthesia    hallucinations , anxiety   . Depression   . H/O echocardiogram    not finding report in EPIC, pt. reports that it was before her appt. /w Dr. Johnsie Cancel   . Hepatitis B dx'd ~ 1980  . Hepatitis C dx'd ~ 2000   "specialist says it cleared itself on it's own"  . Hx of radiation therapy 07/17/13-09/03/13   left breast,60.4Gy, 32/33 fx completed  . Hyperlipidemia   . Hypertension    "never took my RX" (07/31/2014)  . Jaundice   . Malignant neoplasm of lower-outer quadrant of female breast ([place]) 05/19/2013  . Pain management    goes to [facility] clinic, but she doesn't intend to return   . Stroke [facility]) 2012   TIA  . TIA (transient ischemic attack) 06/2011   "can't see real well out of left eye since; weaker on left side since"  . Varicose veins   . Walking pneumonia ~ 2008   Past Surgical History:  Procedure Laterality Date  . BREAST BIOPSY Left 03/2013; 05/2013  . BREAST  LUMPECTOMY Left 04/02/2013   Procedure: Excision  of Left Breast Mass;  Surgeon: Harl Bowie, MD;  Location: [place];  Service: General;  Laterality: Left;  . BREAST LUMPECTOMY WITH SENTINEL LYMPH NODE BIOPSY Left 06/05/2013   Procedure: BREAST LUMPECTOMY WITH SENTINEL LYMPH NODE BX;  Surgeon: Harl Bowie, MD;  Location: [place];  Service: General;  Laterality: Left;  . COLON SURGERY  03/2013   bowel obstruction , malignant tumor    . DILATION AND CURETTAGE OF UTERUS  ~ 1990   S/P miscarriage  . EVACUATION BREAST HEMATOMA Left 06/05/2013   Procedure: EVACUATION HEMATOMA BREAST;  Surgeon: Harl Bowie, MD;  Location: [place];  Service: General;  Laterality: Left;  . LAPAROSCOPY N/A 04/02/2013   Procedure: Diaganostic Lap.; Small Bowel Resection;  Surgeon: Harl Bowie, MD;  Location: [place];  Service: General;  Laterality: N/A;  . TUMOR EXCISION  04/02/2013   "carcoid tumor removed"  . vaginal births   38    birthed twins at -[redacted] weeks gestation - both survived , total of 3 pregnancies - all born vaginally   Family History  Problem Relation Age of Onset  . Diabetes Paternal Grandfather   . Diabetes Paternal Grandmother   . Cancer Father     lung  . Diabetes Mother   . Heart disease Mother   . Cancer Mother  cervical  . Diabetes Brother   . Stroke Brother   . Heart disease Sister   . Diabetes Sister   . Cancer Sister     cervical   Social History  Substance Use Topics  . Smoking status: Current Every Day Smoker    Packs/day: 0.50    Years: 36.00    Types: Cigarettes  . Smokeless tobacco: Never Used  . Alcohol use 2.4 oz/week    4 Glasses of wine per week   OB History    Gravida Para Term Preterm AB Living   5 4   2 1 4    SAB TAB Ectopic Multiple Live Births     1           Review of Systems  Denies: HEADACHE, NAUSEA, ABDOMINAL PAIN, CHEST PAIN, CONGESTION, DYSURIA, SHORTNESS OF BREATH  Allergies  Statins; Ace inhibitors; Sertraline hcl;  Ciprofloxacin hcl; and Tramadol  Home Medications   Prior to Admission medications   Medication Sig Start Date End Date Taking? Authorizing Provider  ALPRAZolam Duanne Moron) 1 MG tablet Take 1 tablet (1 mg total) by mouth 2 (two) times daily as needed for anxiety. 06/04/15  Yes Nicholas Lose, MD  permethrin (ELIMITE) 5 % cream Apply to affected area once 04/04/16   Konrad Felix, PA   Meds Ordered and Administered this Visit  Medications - No data to display  BP 114/75 (BP Location: Right Arm)   Pulse 75   Temp 97.4 F (36.3 C) (Oral)   Resp 18   SpO2 95%  No data found.   Physical Exam NURSES NOTES AND VITAL SIGNS REVIEWED. CONSTITUTIONAL: Well developed, well nourished, no acute distress HEENT: normocephalic, atraumatic, There are multiple nits as noted in the hair. Scalp is dry. EYES: Conjunctiva normal NECK:normal ROM, supple, no adenopathy PULMONARY:No respiratory distress, normal effort ABDOMINAL: Soft, ND, NT BS+, No CVAT MUSCULOSKELETAL: Normal ROM of all extremities,  SKIN: warm and dry without rash PSYCHIATRIC: Mood and affect, behavior are normal  Urgent Care Course   Clinical Course    Procedures (including critical care time)  Labs Review Labs Reviewed - No data to display  Imaging Review No results found.   Visual Acuity Review  Right Eye Distance:   Left Eye Distance:   Bilateral Distance:    Right Eye Near:   Left Eye Near:    Bilateral Near:       RX forElimite provided. Instructions of care provided.  MDM   1. Head lice infestation     Patient is reassured that there are no issues that require transfer to higher level of care at this time or additional tests. Patient is advised to continue home symptomatic treatment. Patient is advised that if there are new or worsening symptoms to attend the emergency department, contact primary care provider, or return to UC. Instructions of care provided discharged home in stable  condition.    THIS NOTE WAS GENERATED USING A VOICE RECOGNITION SOFTWARE PROGRAM. ALL REASONABLE EFFORTS  WERE MADE TO PROOFREAD THIS DOCUMENT FOR ACCURACY.  I have verbally reviewed the discharge instructions with the patient. A printed AVS was given to the patient.  All questions were answered prior to discharge.      Konrad Felix, PA 04/04/16 1051

## 2016-04-04 NOTE — ED Triage Notes (Signed)
The patient presented to the [facility] with a complaint of a possible head lice infestation. The patient reported that her grand children have been repeatedly exposed at another family members home throughout the summer. She did report using RIT x 2 already.

## 2016-05-02 ENCOUNTER — Ambulatory Visit
Admission: RE | Admit: 2016-05-02 | Discharge: 2016-05-02 | Disposition: A | Discharge status: Home / Self Care | Payer: Medicaid Other | Source: Ambulatory Visit | Attending: Hematology and Oncology | Admitting: Hematology and Oncology

## 2016-05-02 ENCOUNTER — Other Ambulatory Visit: Payer: Self-pay | Admitting: Hematology and Oncology

## 2016-05-02 DIAGNOSIS — N644 Mastodynia: Secondary | ICD-10-CM

## 2016-05-02 DIAGNOSIS — Z853 Personal history of malignant neoplasm of breast: Secondary | ICD-10-CM

## 2016-08-11 ENCOUNTER — Other Ambulatory Visit: Payer: Self-pay | Admitting: Physician Assistant

## 2016-08-24 ENCOUNTER — Other Ambulatory Visit: Payer: Self-pay | Admitting: Physician Assistant

## 2016-08-24 DIAGNOSIS — F17200 Nicotine dependence, unspecified, uncomplicated: Secondary | ICD-10-CM

## 2016-08-30 ENCOUNTER — Ambulatory Visit
Admission: RE | Admit: 2016-08-30 | Discharge: 2016-08-30 | Disposition: A | Discharge status: Home / Self Care | Payer: Medicaid Other | Source: Ambulatory Visit | Attending: Physician Assistant | Admitting: Physician Assistant

## 2016-08-30 DIAGNOSIS — F17200 Nicotine dependence, unspecified, uncomplicated: Secondary | ICD-10-CM

## 2016-10-19 ENCOUNTER — Emergency Department (HOSPITAL_COMMUNITY)
Admission: EM | Admit: 2016-10-19 | Discharge: 2016-10-19 | Disposition: A | Discharge status: Home / Self Care | Payer: Medicaid Other | Attending: Emergency Medicine | Admitting: Emergency Medicine

## 2016-10-19 ENCOUNTER — Emergency Department (HOSPITAL_COMMUNITY): Payer: Medicaid Other

## 2016-10-19 ENCOUNTER — Encounter (HOSPITAL_COMMUNITY): Payer: Self-pay | Admitting: Emergency Medicine

## 2016-10-19 DIAGNOSIS — F17210 Nicotine dependence, cigarettes, uncomplicated: Secondary | ICD-10-CM | POA: Insufficient documentation

## 2016-10-19 DIAGNOSIS — I10 Essential (primary) hypertension: Secondary | ICD-10-CM | POA: Diagnosis not present

## 2016-10-19 DIAGNOSIS — R0789 Other chest pain: Secondary | ICD-10-CM | POA: Insufficient documentation

## 2016-10-19 DIAGNOSIS — R079 Chest pain, unspecified: Secondary | ICD-10-CM

## 2016-10-19 DIAGNOSIS — Z853 Personal history of malignant neoplasm of breast: Secondary | ICD-10-CM | POA: Diagnosis not present

## 2016-10-19 DIAGNOSIS — Z7982 Long term (current) use of aspirin: Secondary | ICD-10-CM | POA: Diagnosis not present

## 2016-10-19 DIAGNOSIS — Z8673 Personal history of transient ischemic attack (TIA), and cerebral infarction without residual deficits: Secondary | ICD-10-CM | POA: Insufficient documentation

## 2016-10-19 LAB — CBC WITH DIFFERENTIAL/PLATELET
Basophils Absolute: 0 10*3/uL (ref 0.0–0.1)
Basophils Relative: 0 %
EOS PCT: 2 %
Eosinophils Absolute: 0.1 10*3/uL (ref 0.0–0.7)
HCT: 39 % (ref 36.0–46.0)
Hemoglobin: 12.7 g/dL (ref 12.0–15.0)
LYMPHS ABS: 2 10*3/uL (ref 0.7–4.0)
LYMPHS PCT: 33 %
MCH: 28.4 pg (ref 26.0–34.0)
MCHC: 32.6 g/dL (ref 30.0–36.0)
MCV: 87.2 fL (ref 78.0–100.0)
MONOS PCT: 5 %
Monocytes Absolute: 0.3 10*3/uL (ref 0.1–1.0)
Neutro Abs: 3.7 10*3/uL (ref 1.7–7.7)
Neutrophils Relative %: 60 %
PLATELETS: 195 10*3/uL (ref 150–400)
RBC: 4.47 MIL/uL (ref 3.87–5.11)
RDW: 13.5 % (ref 11.5–15.5)
WBC: 6.2 10*3/uL (ref 4.0–10.5)

## 2016-10-19 LAB — COMPREHENSIVE METABOLIC PANEL
ALK PHOS: 55 U/L (ref 38–126)
ALT: 17 U/L (ref 14–54)
AST: 18 U/L (ref 15–41)
Albumin: 3.6 g/dL (ref 3.5–5.0)
Anion gap: 9 (ref 5–15)
BUN: 13 mg/dL (ref 6–20)
CALCIUM: 9.1 mg/dL (ref 8.9–10.3)
CO2: 26 mmol/L (ref 22–32)
CREATININE: 0.83 mg/dL (ref 0.44–1.00)
Chloride: 105 mmol/L (ref 101–111)
Glucose, Bld: 117 mg/dL — ABNORMAL HIGH (ref 65–99)
Potassium: 3.8 mmol/L (ref 3.5–5.1)
Sodium: 140 mmol/L (ref 135–145)
Total Bilirubin: 0.7 mg/dL (ref 0.3–1.2)
Total Protein: 6 g/dL — ABNORMAL LOW (ref 6.5–8.1)

## 2016-10-19 LAB — D-DIMER, QUANTITATIVE: D-Dimer, Quant: <0.27 ug/mL-FEU (ref 0.00–0.50)

## 2016-10-19 LAB — I-STAT TROPONIN, ED
TROPONIN I, POC: 0 ng/mL (ref 0.00–0.08)
Troponin i, poc: 0 ng/mL (ref 0.00–0.08)

## 2016-10-19 LAB — LIPASE, BLOOD: Lipase: 17 U/L (ref 11–51)

## 2016-10-19 MED ORDER — ALPRAZOLAM 0.25 MG PO TABS
1.0000 mg | ORAL_TABLET | Freq: Once | ORAL | Status: AC
  Administered 2016-10-19: 1 mg via ORAL
  Filled 2016-10-19: qty 4

## 2016-10-19 MED ORDER — ACETAMINOPHEN 500 MG PO TABS
1000.0000 mg | ORAL_TABLET | Freq: Once | ORAL | Status: AC
  Administered 2016-10-19: 1000 mg via ORAL
  Filled 2016-10-19: qty 2

## 2016-10-19 NOTE — ED Notes (Signed)
ED Provider at bedside. 

## 2016-10-19 NOTE — ED Provider Notes (Signed)
[facility] DEPT Provider Note   CSN: [id number] Arrival date & time:        History   Chief Complaint Chief Complaint  Patient presents with  . Chest Pain    HPI [name] is a 61 y.o. female.  The history is provided by the patient.  Chest Pain   This is a recurrent problem. Episode onset: 4 days. Episode frequency: intermittent. The problem has been resolved. The pain is present in the substernal region. The quality of the pain is described as pressure-like. Radiates to: left midback. Associated symptoms include back pain and cough. Pertinent negatives include no abdominal pain, no fever, no hemoptysis, no leg pain, no lower extremity edema, no nausea, no shortness of breath and no vomiting. She has tried nothing for the symptoms.  Her past medical history is significant for cancer, hyperlipidemia and TIA.  Pertinent negatives for past medical history include no diabetes, no hypertension, no MI and no PE.  Her family medical history is significant for CAD.  Pertinent negatives for family medical history include: no early MI.  Procedure history is negative for cardiac catheterization, stress echo and exercise treadmill test.   Seen by PCP this am and sent for further work up.  Past Medical History:  Diagnosis Date  . Adjustment disorder with mixed anxiety and depressed mood 01/05/2013  . Allergy   . Anxiety   . Arthritis    "knees,wrists" (07/31/2014)  . Brain aneurysm 2012   2cms.  . Breast disorder    lump in left breast  . Carcinoid tumor   . Complication of anesthesia    hallucinations , anxiety   . Depression   . H/O echocardiogram    not finding report in EPIC, pt. reports that it was before her appt. /w Dr. Johnsie Cancel   . Hepatitis B dx'd ~ 1980  . Hepatitis C dx'd ~ 2000   "specialist says it cleared itself on it's own"  . Hx of radiation therapy 07/17/13-09/03/13   left breast,60.4Gy, 32/33 fx completed  . Hyperlipidemia   . Hypertension    "never  took my RX" (07/31/2014)  . Jaundice   . Malignant neoplasm of lower-outer quadrant of female breast ([place]) 05/19/2013  . Pain management    goes to [facility], but she doesn't intend to return   . Stroke [facility]) 2012   TIA  . TIA (transient ischemic attack) 06/2011   "can't see real well out of left eye since; weaker on left side since"  . Varicose veins   . Walking pneumonia ~ 2008    Patient Active Problem List   Diagnosis Date Noted  . Head lice infestation [id number]  . Chest pain 07/31/2014  . 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 Partial small bowel obstruction 04/07/2013  . SBO (small bowel obstruction) 01/05/2013  . Adjustment disorder with mixed anxiety and depressed mood 01/05/2013  . Chronic pain syndrome 01/05/2013  . Thyroid cyst 01/05/2013  . Smoking 12/05/2012  . Hepatitis C   . Jaundice   . Hyperlipidemia   . Varicose veins   . Brain aneurysm   . TIA (transient ischemic attack) 06/12/2011    Past Surgical History:  Procedure Laterality Date  . BREAST BIOPSY Left 03/2013; 05/2013  . BREAST LUMPECTOMY Left 04/02/2013   Procedure: Excision  of Left Breast Mass;  Surgeon: Harl Bowie, MD;  Location: [facility];  Service: General;  Laterality: Left;  . BREAST LUMPECTOMY WITH SENTINEL LYMPH NODE BIOPSY Left 06/05/2013   Procedure: BREAST LUMPECTOMY WITH SENTINEL LYMPH NODE BX;  Surgeon: Harl Bowie, MD;  Location: [facility];  Service: General;  Laterality: Left;  . COLON SURGERY  03/2013   bowel obstruction , malignant tumor    . DILATION AND CURETTAGE OF UTERUS  ~ 1990   S/P miscarriage  . EVACUATION BREAST HEMATOMA Left 06/05/2013   Procedure: EVACUATION HEMATOMA BREAST;  Surgeon: Harl Bowie, MD;  Location: [facility];  Service: General;  Laterality: Left;  . LAPAROSCOPY N/A 04/02/2013   Procedure: Diaganostic Lap.; Small Bowel Resection;  Surgeon: Harl Bowie, MD;   Location: [facility];  Service: General;  Laterality: N/A;  . TUMOR EXCISION  04/02/2013   "carcoid tumor removed"  . vaginal births   29    birthed twins at -[redacted] weeks gestation - both survived , total of 3 pregnancies - all born vaginally    OB History    Gravida Para Term Preterm AB Living   5 4   2 1 4    SAB TAB Ectopic Multiple Live Births     1             Home Medications    Prior to Admission medications   Medication Sig Start Date End Date Taking? Authorizing Provider  ALPRAZolam Duanne Moron) 1 MG tablet Take 1 tablet (1 mg total) by mouth 2 (two) times daily as needed for anxiety. 06/04/15  Yes Nicholas Lose, MD  aspirin EC 81 MG tablet Take 81 mg by mouth daily.   Yes Historical Provider, MD  permethrin (ELIMITE) 5 % cream Apply to affected area once Patient not taking: Reported on 10/19/2016 04/04/16   Konrad Felix, PA    Family History Family History  Problem Relation Age of Onset  . Diabetes Paternal Grandfather   . Diabetes Paternal Grandmother   . Diabetes Mother   . Heart disease Mother   . Cancer Mother     cervical  . Diabetes Brother   . Stroke Brother   . Heart disease Sister   . Diabetes Sister   . Cancer Sister     cervical  . Cancer Father     lung    Social History Social History  Substance Use Topics  . Smoking status: Current Every Day Smoker    Packs/day: 0.50    Years: 36.00    Types: Cigarettes  . Smokeless tobacco: Never Used  . Alcohol use 2.4 oz/week    4 Glasses of wine per week     Allergies   Statins; Ace inhibitors; Sertraline hcl; Ciprofloxacin hcl; and Tramadol   Review of Systems Review of Systems  Constitutional: Negative for fever.  Respiratory: Positive for cough. Negative for hemoptysis and shortness of breath.   Cardiovascular: Positive for chest pain.  Gastrointestinal: Negative for abdominal pain, nausea and vomiting.  Musculoskeletal: Positive for back pain.   Ten systems are reviewed and are negative for acute  change except as noted in the HPI   Physical Exam Updated Vital Signs BP 179/88   Pulse 72   Temp 97.9 F (36.6 C) (Oral)   Resp 16   Wt 167 lb (75.8 kg)   SpO2 98%   BMI 26.95 kg/m   Physical Exam  Constitutional: She is oriented to person, place, and time. She appears well-developed and well-nourished. No distress.  HENT:  Head: Normocephalic and atraumatic.  Nose: Nose normal.  Eyes:  Conjunctivae and EOM are normal. Pupils are equal, round, and reactive to light. Right eye exhibits no discharge. Left eye exhibits no discharge. No scleral icterus.  Neck: Normal range of motion. Neck supple.  Cardiovascular: Normal rate and regular rhythm.  Exam reveals no gallop and no friction rub.   No murmur heard. Pulmonary/Chest: Effort normal and breath sounds normal. No stridor. No respiratory distress. She has no rales.  Abdominal: Soft. She exhibits no distension. There is no tenderness.  Musculoskeletal: She exhibits no edema or tenderness.  Neurological: She is alert and oriented to person, place, and time.  Skin: Skin is warm and dry. No rash noted. She is not diaphoretic. No erythema.  Psychiatric: She has a normal mood and affect.  Vitals reviewed.    ED Treatments / Results  Labs (all labs ordered are listed, but only abnormal results are displayed) Labs Reviewed  COMPREHENSIVE METABOLIC PANEL - Abnormal; Notable for the following:       Result Value   Glucose, Bld 117 (*)    Total Protein 6.0 (*)    All other components within normal limits  LIPASE, BLOOD  D-DIMER, QUANTITATIVE (NOT AT [facility])  CBC WITH DIFFERENTIAL/PLATELET  Randolm Idol, ED  I-STAT TROPOININ, ED    EKG  EKG Interpretation  Date/Time:  Thursday October 19 2016 11:00:23 EST Ventricular Rate:  55 PR Interval:    QRS Duration: 100 QT Interval:  426 QTC Calculation: 408 R Axis:   -13 Text Interpretation:  Sinus rhythm Nonspecific T wave abnormality improved from prior.  Confirmed by  [facility] MD, [place] [phone]) on 10/19/2016 1:11:55 PM       Radiology Dg Chest 2 View  Result Date: 10/19/2016 CLINICAL DATA:  Onset of chest pressure yesterday. Two weeks of nonproductive cough and shortness of breath. Chronic posterolateral left chest pain. Long-term smoker. EXAM: CHEST  2 VIEW COMPARISON:  Chest x-ray of July 31, 2014 and chest CT scan of August 30, 2016 FINDINGS: The lungs remain mildly hyperinflated. There is no focal infiltrate. There is no pleural effusion or pneumothorax. The heart and pulmonary vascularity are normal. The mediastinum is normal in width. There is calcification in the wall of the aortic arch. The bony thorax exhibits no acute abnormality. IMPRESSION: Chronic bronchitic changes, stable. There is no pneumonia nor other acute cardiopulmonary abnormality. Thoracic aortic atherosclerosis. Electronically Signed   By: David  Martinique M.D.   On: 10/19/2016 11:32    Procedures Procedures (including critical care time)  Medications Ordered in ED Medications  ALPRAZolam Duanne Moron) tablet 1 mg (1 mg Oral Given 10/19/16 1321)  acetaminophen (TYLENOL) tablet 1,000 mg (1,000 mg Oral Given 10/19/16 1322)     Initial Impression / Assessment and Plan / ED Course  I have reviewed the triage vital signs and the nursing notes.  Pertinent labs & imaging results that were available during my care of the patient were reviewed by me and considered in my medical decision making (see chart for details).     Atypical chest pain. EKG without acute ischemic changes or evidence of pericarditis. Initial troponin negative. Heart score less than 4. Appropriate for delta troponin.  Presentation is not concerning for pulmonary embolism however unable to [facility] pt due to history of cancer. Dimer obtained which was negative.  Chest x-ray without evidence suggestive of pneumonia, pneumothorax, pneumomediastinum.  No abnormal contour of the mediastinum to suggest dissection. No evidence of  acute injuries.  Presentation a classic for aortic dissection or esophageal perforation.  Delta troponin  was negative.   The patient is safe for discharge with strict return precautions.   Final Clinical Impressions(s) / ED Diagnoses   Final diagnoses:  Chest pain  Nonspecific chest pain   Disposition: Discharge  Condition: Good  I have discussed the results, Dx and Tx plan with the patient who expressed understanding and agree(s) with the plan. Discharge instructions discussed at great length. The patient was given strict return precautions who verbalized understanding of the instructions. No further questions at time of discharge.    New Prescriptions   No medications on file    Follow Up: Chesley Noon, MD [place] [id number] [phone]  Schedule an appointment as soon as possible for a visit  in 3-5 days, If symptoms do not improve or  worsen      Fatima Blank, MD 10/19/16 [phone]

## 2016-10-19 NOTE — ED Notes (Signed)
Papers reviewed and patient reports decreased pain and ready to go home

## 2016-10-19 NOTE — ED Notes (Signed)
Ed Provider at bedside per this RN's request to evaluate L sided  weakness.

## 2016-10-19 NOTE — ED Triage Notes (Signed)
Pt arrives from PCP via [facility] reporting CP for past 2-3 days, worsening nonproductive cough.  Pt reports pain radiates to back, denies LOC, N/V, diaphoresis.  EMS reports pt received 324 ASA at PCP, refuses NTG.

## 2017-02-16 ENCOUNTER — Other Ambulatory Visit (HOSPITAL_BASED_OUTPATIENT_CLINIC_OR_DEPARTMENT_OTHER): Payer: Medicaid Other

## 2017-02-16 ENCOUNTER — Ambulatory Visit (HOSPITAL_BASED_OUTPATIENT_CLINIC_OR_DEPARTMENT_OTHER): Payer: Medicaid Other | Admitting: Hematology and Oncology

## 2017-02-16 ENCOUNTER — Encounter: Payer: Self-pay | Admitting: Hematology and Oncology

## 2017-02-16 DIAGNOSIS — D0512 Intraductal carcinoma in situ of left breast: Secondary | ICD-10-CM

## 2017-02-16 DIAGNOSIS — Z85060 Personal history of malignant carcinoid tumor of small intestine: Secondary | ICD-10-CM | POA: Diagnosis not present

## 2017-02-16 DIAGNOSIS — Z86000 Personal history of in-situ neoplasm of breast: Secondary | ICD-10-CM | POA: Diagnosis present

## 2017-02-16 DIAGNOSIS — C7A010 Malignant carcinoid tumor of the duodenum: Secondary | ICD-10-CM

## 2017-02-16 LAB — CBC WITH DIFFERENTIAL/PLATELET
BASO%: 0.5 % (ref 0.0–2.0)
BASOS ABS: 0 10*3/uL (ref 0.0–0.1)
EOS ABS: 0.1 10*3/uL (ref 0.0–0.5)
EOS%: 1.8 % (ref 0.0–7.0)
HEMATOCRIT: 42.5 % (ref 34.8–46.6)
HEMOGLOBIN: 14 g/dL (ref 11.6–15.9)
LYMPH#: 1.3 10*3/uL (ref 0.9–3.3)
LYMPH%: 24.5 % (ref 14.0–49.7)
MCH: 29.2 pg (ref 25.1–34.0)
MCHC: 32.9 g/dL (ref 31.5–36.0)
MCV: 88.8 fL (ref 79.5–101.0)
MONO#: 0.4 10*3/uL (ref 0.1–0.9)
MONO%: 7.1 % (ref 0.0–14.0)
NEUT#: 3.4 10*3/uL (ref 1.5–6.5)
NEUT%: 66.1 % (ref 38.4–76.8)
Platelets: 192 10*3/uL (ref 145–400)
RBC: 4.79 10*6/uL (ref 3.70–5.45)
RDW: 13.5 % (ref 11.2–14.5)
WBC: 5.2 10*3/uL (ref 3.9–10.3)

## 2017-02-16 LAB — COMPREHENSIVE METABOLIC PANEL
ALBUMIN: 3.9 g/dL (ref 3.5–5.0)
ALT: 19 U/L (ref 0–55)
AST: 16 U/L (ref 5–34)
Alkaline Phosphatase: 67 U/L (ref 40–150)
Anion Gap: 8 mEq/L (ref 3–11)
BUN: 11.1 mg/dL (ref 7.0–26.0)
CALCIUM: 9.7 mg/dL (ref 8.4–10.4)
CO2: 27 mEq/L (ref 22–29)
CREATININE: 0.8 mg/dL (ref 0.6–1.1)
Chloride: 109 mEq/L (ref 98–109)
EGFR: 80 mL/min/{1.73_m2} — AB (ref >90)
Glucose: 107 mg/dl (ref 70–140)
POTASSIUM: 4.5 meq/L (ref 3.5–5.1)
Sodium: 144 mEq/L (ref 136–145)
TOTAL PROTEIN: 6.7 g/dL (ref 6.4–8.3)
Total Bilirubin: 0.77 mg/dL (ref 0.20–1.20)

## 2017-02-16 NOTE — Assessment & Plan Note (Signed)
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 Breast exam done 02/16/2017 benign  2. Mammogram 05/02/2016 with ultrasound: Benign  breast density category B

## 2017-02-16 NOTE — Progress Notes (Signed)
Patient Care Team: Chesley Noon, MD as PCP - General (Family Medicine)  DIAGNOSIS:  Encounter Diagnoses  Name Primary?  . Ductal carcinoma in situ (DCIS) of left breast   . 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 She reports no pain lumps or nodules in breast. She was also diagnosed with carcinoid of the duodenum with the same surgery and is currently on surveillance. She had 2 positive lymph nodes. She denies any abdominal pain nausea vomiting. She denies any flushing sensation or diarrhea. Patient has been experiencing leg cramps which seem to keep her awake at night and her primary care physician has been trying multiple different options including turmeric as well as magnesium supplementation without much benefit. She had a screening low-dose CT scan of the chest in December 2018 which did not show any lung cancer.   this can be covered upper part of the abdomen which did not show any abnormalities of the liver stomach or small intestines.  REVIEW  OF SYSTEMS:   Constitutional: Denies fevers, chills or abnormal weight loss Eyes: Denies blurriness of vision Ears, nose, mouth, throat, and face: Denies mucositis or sore throat Respiratory: Denies cough, dyspnea or wheezes Cardiovascular: Denies palpitation, chest discomfort Gastrointestinal:  Upper abdominal discomfort Skin: Denies abnormal skin rashes Lymphatics: Denies new lymphadenopathy or easy bruising Neurological:Denies numbness, tingling or new weaknesses Behavioral/Psych: Mood is stable, no new changes  Extremities:  Leg cramps Breast:  denies any pain or lumps or nodules in either breasts All other systems were reviewed with the patient and are negative.  I have reviewed the past medical history, past surgical history, social history and family history with the patient and they are unchanged from previous note.  ALLERGIES:  is allergic to statins; ace inhibitors; sertraline hcl; ciprofloxacin hcl; and tramadol.  MEDICATIONS:  Current Outpatient Prescriptions  Medication Sig Dispense Refill  . ALPRAZolam (XANAX) 1 MG tablet Take 1 tablet (1 mg total) by mouth 2 (two) times daily as needed for anxiety. 45 tablet 0  . aspirin EC 81 MG tablet Take 81 mg by mouth daily.    . permethrin (ELIMITE) 5 % cream Apply to affected area once (Patient not taking: Reported on 10/19/2016) 60 g 1   No current facility-administered medications for this visit.   Psoas carcinoid breast   PHYSICAL EXAMINATION: ECOG PERFORMANCE STATUS: 0 - Asymptomatic  Vitals:   02/16/17 0845  BP: (!) 158/88  Pulse: (!) 59  Resp: 18  Temp: 97.6 F (36.4 C)   Filed Weights  02/16/17 0845  Weight: 166 lb 8 oz (75.5 kg)    GENERAL:alert, no distress and comfortable SKIN: skin color, texture, turgor are normal, no rashes or significant lesions EYES: normal, Conjunctiva are pink and non-injected, sclera clear OROPHARYNX:no exudate, no erythema and lips, buccal mucosa, and tongue normal  NECK:  supple, thyroid normal size, non-tender, without nodularity LYMPH:  no palpable lymphadenopathy in the cervical, axillary or inguinal LUNGS: clear to auscultation and percussion with normal breathing effort HEART: regular rate & rhythm and no murmurs and no lower extremity edema ABDOMEN:abdomen soft, non-tender and normal bowel sounds MUSCULOSKELETAL:no cyanosis of digits and no clubbing  NEURO: alert & oriented x 3 with fluent speech, no focal motor/sensory deficits EXTREMITIES: No lower extremity edema BREAST: No palpable masses or nodules in either right or left breasts. No palpable axillary supraclavicular or infraclavicular adenopathy no breast tenderness or nipple discharge. (exam performed in the presence of a chaperone)  LABORATORY DATA:  I have reviewed the data as listed   Chemistry      Component Value Date/Time   NA 140 10/19/2016 1350   NA 142 02/17/2016 0817   K 3.8 10/19/2016 1350   K 4.2 02/17/2016 0817   CL 105 10/19/2016 1350   CO2 26 10/19/2016 1350   CO2 23 02/17/2016 0817   BUN 13 10/19/2016 1350   BUN 18.7 02/17/2016 0817   CREATININE 0.83 10/19/2016 1350   CREATININE 0.9 02/17/2016 0817      Component Value Date/Time   CALCIUM 9.1 10/19/2016 1350   CALCIUM 9.1 02/17/2016 0817   ALKPHOS 55 10/19/2016 1350   ALKPHOS 63 02/17/2016 0817   AST 18 10/19/2016 1350   AST 11 02/17/2016 0817   ALT 17 10/19/2016 1350   ALT 13 02/17/2016 0817   BILITOT 0.7 10/19/2016 1350   BILITOT <0.30 02/17/2016 0817       Lab Results  Component Value Date   WBC 5.2 02/16/2017   HGB 14.0 02/16/2017   HCT 42.5 02/16/2017   MCV 88.8 02/16/2017   PLT 192 02/16/2017   NEUTROABS 3.4 02/16/2017    ASSESSMENT & PLAN:  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 Breast exam done 02/16/2017 benign  2. Mammogram  05/02/2016 with ultrasound: Benign  breast density category B  Malignant carcinoid tumor of duodenum Status post bowel resection July 2014 at the same time that she had lumpectomy. She has a liver lesion that is being followed. She does not have any symptoms related to carcinoid syndrome and previous chromogranin A levels have all been normal.  CT chest abdomen pelvis 06/25/2015 showed stable 1.8 x 2 cm enhancing lesion within the right lobe of the liver. This has been stable from before so we are not planning on any further CT scans unless she has symptoms.  Smoking cessation: I discussed with her about quitting smoking. She is planning on cutting down the cigarettes and then slowly discontinuing it. Social issues: Patient's grandchildren are apparently living in very unhygienic conditions and she is extremely stressed about it and she has attempted to contact social services and they are trying to inspect the grandchildren's house. This has caused her emotional distress. Chromogranin A level is 2 on 02/17/2016 in the normal range. Follow-up in one year with CBC and CMP and chromogranin A.   I spent 25 minutes talking to the patient of which  more than half was spent in counseling and coordination of care.  No orders of the defined types were placed in this encounter.  The patient has a good understanding of the overall plan. she agrees with it. she will call with any problems that may develop before the next visit here.   Rulon Eisenmenger, MD 02/16/17

## 2017-02-16 NOTE — Assessment & Plan Note (Signed)
Status post bowel resection July 2014 at the same time that she had lumpectomy. She has a liver lesion that is being followed. She does not have any symptoms related to carcinoid syndrome and previous chromogranin A levels have all been normal.  CT chest abdomen pelvis 06/25/2015 showed stable 1.8 x 2 cm enhancing lesion within the right lobe of the liver. This has been stable from before so we are not planning on any further CT scans unless she has symptoms.  Smoking cessation: I discussed with her about quitting smoking. She is planning on cutting down the cigarettes and then slowly discontinuing it.  Chromogranin A level is 2 on 02/17/2016 in the normal range. Follow-up in one year with CBC and CMP and chromogranin A.

## 2017-04-29 ENCOUNTER — Emergency Department (HOSPITAL_COMMUNITY): Payer: Medicaid Other

## 2017-04-29 ENCOUNTER — Emergency Department (HOSPITAL_COMMUNITY)
Admission: EM | Admit: 2017-04-29 | Discharge: 2017-04-29 | Disposition: A | Discharge status: Home / Self Care | Payer: Medicaid Other | Attending: Emergency Medicine | Admitting: Emergency Medicine

## 2017-04-29 ENCOUNTER — Encounter (HOSPITAL_COMMUNITY): Payer: Self-pay | Admitting: Emergency Medicine

## 2017-04-29 DIAGNOSIS — F17210 Nicotine dependence, cigarettes, uncomplicated: Secondary | ICD-10-CM | POA: Insufficient documentation

## 2017-04-29 DIAGNOSIS — M791 Myalgia, unspecified site: Secondary | ICD-10-CM

## 2017-04-29 DIAGNOSIS — Z7982 Long term (current) use of aspirin: Secondary | ICD-10-CM | POA: Insufficient documentation

## 2017-04-29 DIAGNOSIS — Z853 Personal history of malignant neoplasm of breast: Secondary | ICD-10-CM | POA: Diagnosis not present

## 2017-04-29 DIAGNOSIS — E785 Hyperlipidemia, unspecified: Secondary | ICD-10-CM | POA: Diagnosis not present

## 2017-04-29 DIAGNOSIS — R079 Chest pain, unspecified: Secondary | ICD-10-CM | POA: Diagnosis present

## 2017-04-29 DIAGNOSIS — I10 Essential (primary) hypertension: Secondary | ICD-10-CM | POA: Insufficient documentation

## 2017-04-29 DIAGNOSIS — Z8673 Personal history of transient ischemic attack (TIA), and cerebral infarction without residual deficits: Secondary | ICD-10-CM | POA: Insufficient documentation

## 2017-04-29 DIAGNOSIS — Z79899 Other long term (current) drug therapy: Secondary | ICD-10-CM | POA: Insufficient documentation

## 2017-04-29 LAB — CBC
HCT: 39.4 % (ref 36.0–46.0)
Hemoglobin: 13.1 g/dL (ref 12.0–15.0)
MCH: 28.7 pg (ref 26.0–34.0)
MCHC: 33.2 g/dL (ref 30.0–36.0)
MCV: 86.4 fL (ref 78.0–100.0)
PLATELETS: 233 10*3/uL (ref 150–400)
RBC: 4.56 MIL/uL (ref 3.87–5.11)
RDW: 13.5 % (ref 11.5–15.5)
WBC: 5.2 10*3/uL (ref 4.0–10.5)

## 2017-04-29 LAB — D-DIMER, QUANTITATIVE: D-Dimer, Quant: 0.32 ug/mL-FEU (ref 0.00–0.50)

## 2017-04-29 LAB — BASIC METABOLIC PANEL
Anion gap: 9 (ref 5–15)
BUN: 10 mg/dL (ref 6–20)
CHLORIDE: 108 mmol/L (ref 101–111)
CO2: 24 mmol/L (ref 22–32)
CREATININE: 0.75 mg/dL (ref 0.44–1.00)
Calcium: 8.8 mg/dL — ABNORMAL LOW (ref 8.9–10.3)
Glucose, Bld: 102 mg/dL — ABNORMAL HIGH (ref 65–99)
POTASSIUM: 4.3 mmol/L (ref 3.5–5.1)
SODIUM: 141 mmol/L (ref 135–145)

## 2017-04-29 LAB — I-STAT TROPONIN, ED: Troponin i, poc: 0 ng/mL (ref 0.00–0.08)

## 2017-04-29 LAB — SEDIMENTATION RATE: SED RATE: 3 mm/h (ref 0–22)

## 2017-04-29 LAB — CK: CK TOTAL: 49 U/L (ref 38–234)

## 2017-04-29 MED ORDER — MORPHINE SULFATE (PF) 4 MG/ML IV SOLN
4.0000 mg | INTRAVENOUS | Status: DC | PRN
  Administered 2017-04-29: 4 mg via INTRAVENOUS
  Filled 2017-04-29: qty 1

## 2017-04-29 MED ORDER — KETOROLAC TROMETHAMINE 30 MG/ML IJ SOLN
30.0000 mg | Freq: Once | INTRAMUSCULAR | Status: AC
  Administered 2017-04-29: 30 mg via INTRAVENOUS
  Filled 2017-04-29: qty 1

## 2017-04-29 MED ORDER — METHOCARBAMOL 1000 MG/10ML IJ SOLN
1000.0000 mg | Freq: Once | INTRAMUSCULAR | Status: AC
  Administered 2017-04-29: 1000 mg via INTRAVENOUS
  Filled 2017-04-29: qty 10

## 2017-04-29 MED ORDER — ONDANSETRON HCL 4 MG/2ML IJ SOLN
4.0000 mg | Freq: Once | INTRAMUSCULAR | Status: AC
  Administered 2017-04-29: 4 mg via INTRAVENOUS
  Filled 2017-04-29: qty 2

## 2017-04-29 MED ORDER — NAPROXEN 500 MG PO TABS: 500.0000 mg | ORAL_TABLET | Freq: Two times a day (BID) | ORAL | 0 refills | Status: DC

## 2017-04-29 MED ORDER — METHOCARBAMOL 500 MG PO TABS: 500.0000 mg | ORAL_TABLET | Freq: Three times a day (TID) | ORAL | 0 refills | Status: DC | PRN

## 2017-04-29 NOTE — ED Notes (Signed)
ED Provider at bedside. 

## 2017-04-29 NOTE — ED Notes (Signed)
Pt given resource for [employer] upon discharge.

## 2017-04-29 NOTE — ED Notes (Signed)
Pt denies any suicidal or homicidal thoughts but states she has a lot of stress at home and would like to get some resources for counselors or someone she can talk to. Dr. Jeneen Rinks made aware.

## 2017-04-29 NOTE — ED Triage Notes (Signed)
Pt states "about one week ive been short of breath, my chest has been so sore and right through the right side of my neck, and the back of my shoulder hurts, my feet have been swollen, and both my legs have been hurting, a dull pain in the back of my legs."

## 2017-04-29 NOTE — Discharge Instructions (Addendum)
Your testing for muscle injury, autoimmune muscle disease, and heart disease today was normal.  Follow-up with your primary care physician if her symptoms persist after treatment.  [facility] [place] is available for outpatient evaluation for depression and anxiety. Phone number and information is listed above.

## 2017-04-29 NOTE — ED Provider Notes (Signed)
[facility] DEPT Provider Note   CSN: [id number] Arrival date & time: 04/29/17  4481     History   Chief Complaint Chief Complaint  Patient presents with  . Chest Pain  . Shortness of Breath  . Leg Pain    HPI [name] is a 61 y.o. female.Chief complaint is muscle pain, chest pain  HPI 61 year old female. History of smoking. No history of known cardiovascular disease. History of known cerebral aneurysm. History of benign lumpectomy, history of resected carcinoid tumor in her abdomen  Has had evaluation for previous episodes of intermittent chest pain. Diona Fanti been benign. Has never been seen by cardiology. Never had formal testing or heart cath.  Describes leg pain for the last 10 days. Primarily lower legs but to her mid thighs symmetric. States that for the last 2 days  "almost couldn't get out of bed". Previous muscle discomfort was statins but has not been on a medication for cholesterol for over 3 years. No documented history of fibromyalgia, PMR, or neuromuscular disorder  As also developed pain in her xiphoid. States it "hurts really bad to touch right here and she pushes on her xiphoid. Does not feel cough or shortness of breath. Has occasional headaches and has a headache today. No focal weakness or numbness or other symptoms  Past Medical History:  Diagnosis Date  . Adjustment disorder with mixed anxiety and depressed mood 01/05/2013  . Allergy   . Anxiety   . Arthritis    "knees,wrists" (07/31/2014)  . Brain aneurysm 2012   2cms.  . Breast disorder    lump in left breast  . Carcinoid tumor   . Complication of anesthesia    hallucinations , anxiety   . Depression   . H/O echocardiogram    not finding report in EPIC, pt. reports that it was before her appt. /w Dr. Johnsie Cancel   . Hepatitis B dx'd ~ 1980  . Hepatitis C dx'd ~ 2000   "specialist says it cleared itself on it's own"  . Hx of radiation therapy 07/17/13-09/03/13   left breast,60.4Gy, 32/33 fx  completed  . Hyperlipidemia   . Hypertension    "never took my RX" (07/31/2014)  . Jaundice   . 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 Pain management    goes to [facility] clinic, but she doesn't intend to return   . Stroke [facility]) 2012   TIA  . TIA (transient ischemic attack) 06/2011   "can't see real well out of left eye since; weaker on left side since"  . Varicose veins   . Walking pneumonia ~ 2008    Patient Active Problem List   Diagnosis Date Noted  . Head lice infestation 85/63/1497  . Chest pain 07/31/2014  . 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 Partial small bowel obstruction ([place]) 04/07/2013  . SBO (small bowel obstruction) ([place]) 01/05/2013  . Adjustment disorder with mixed anxiety and depressed mood 01/05/2013  . Chronic pain syndrome 01/05/2013  . Thyroid cyst 01/05/2013  . Smoking 12/05/2012  . Hepatitis C   . Jaundice   . Hyperlipidemia   . Varicose veins   . Brain aneurysm   . TIA (transient ischemic attack) 06/12/2011    Past Surgical History:  Procedure Laterality Date  . BREAST BIOPSY Left 03/2013; 05/2013  . BREAST LUMPECTOMY Left 04/02/2013   Procedure: Excision  of Left Breast Mass;  Surgeon: Harl Bowie, MD;  Location: [place];  Service: General;  Laterality: Left;  . BREAST LUMPECTOMY WITH SENTINEL LYMPH NODE BIOPSY Left 06/05/2013   Procedure: BREAST LUMPECTOMY WITH SENTINEL LYMPH NODE BX;  Surgeon: Harl Bowie, MD;  Location: [place];  Service: General;  Laterality: Left;  . COLON SURGERY  03/2013   bowel obstruction , malignant tumor    . DILATION AND CURETTAGE OF UTERUS  ~ 1990   S/P miscarriage  . EVACUATION BREAST HEMATOMA Left 06/05/2013   Procedure: EVACUATION HEMATOMA BREAST;  Surgeon: Harl Bowie, MD;  Location: [place];  Service: General;  Laterality: Left;  . LAPAROSCOPY N/A 04/02/2013   Procedure: Diaganostic  Lap.; Small Bowel Resection;  Surgeon: Harl Bowie, MD;  Location: [place];  Service: General;  Laterality: N/A;  . TUMOR EXCISION  04/02/2013   "carcoid tumor removed"  . vaginal births   33    birthed twins at -[redacted] weeks gestation - both survived , total of 3 pregnancies - all born vaginally    OB History    Gravida Para Term Preterm AB Living   5 4   2 1 4    SAB TAB Ectopic Multiple Live Births     1             Home Medications    Prior to Admission medications   Medication Sig Start Date End Date Taking? Authorizing Provider  ALPRAZolam Duanne Moron) 1 MG tablet Take 1 tablet (1 mg total) by mouth 2 (two) times daily as needed for anxiety. 06/04/15  Yes Nicholas Lose, MD  aspirin EC 81 MG tablet Take 81 mg by mouth daily.   Yes [provider]  methocarbamol (ROBAXIN) 500 MG tablet Take 1 tablet (500 mg total) by mouth 3 (three) times daily between meals as needed. 04/29/17   Tanna Furry, MD  naproxen (NAPROSYN) 500 MG tablet Take 1 tablet (500 mg total) by mouth 2 (two) times daily. 04/29/17   Tanna Furry, MD  permethrin (ELIMITE) 5 % cream Apply to affected area once Patient not taking: Reported on 10/19/2016 04/04/16   Konrad Felix, PA    Family History Family History  Problem Relation Age of Onset  . Diabetes Paternal Grandfather   . Diabetes Paternal Grandmother   . Diabetes Mother   . Heart disease Mother   . Cancer Mother        cervical  . Diabetes Brother   . Stroke Brother   . Heart disease Sister   . Diabetes Sister   . Cancer Sister        cervical  . Cancer Father        lung    Social History Social History  Substance Use Topics  . Smoking status: Current Every Day Smoker    Packs/day: 0.50    Years: 36.00    Types: Cigarettes  . Smokeless tobacco: Never Used  . Alcohol use 2.4 oz/week    4 Glasses of wine per week     Allergies   Statins; Ace inhibitors; Sertraline hcl; Ciprofloxacin hcl; and Tramadol   Review of  Systems Review of Systems  Constitutional: Negative for appetite change, chills, diaphoresis, fatigue and fever.  HENT: Negative for mouth sores, sore throat and trouble swallowing.   Eyes: Negative for visual disturbance.  Respiratory: Negative for cough, chest tightness, shortness of breath and wheezing.   Cardiovascular: Positive for chest pain.  Gastrointestinal: Negative for abdominal distention, abdominal pain, diarrhea, nausea  and vomiting.  Endocrine: Negative for polydipsia, polyphagia and polyuria.  Genitourinary: Negative for dysuria, frequency and hematuria.  Musculoskeletal: Positive for gait problem and myalgias.  Skin: Negative for color change, pallor and rash.  Neurological: Negative for dizziness, syncope, light-headedness and headaches.  Hematological: Does not bruise/bleed easily.  Psychiatric/Behavioral: Negative for behavioral problems and confusion.     Physical Exam Updated Vital Signs BP (!) 149/88   Pulse (!) 58   Temp 97.7 F (36.5 C) (Oral)   Resp 18   Ht 5\' 6"  (1.676 m)   Wt 76.7 kg (169 lb)   SpO2 99%   BMI 27.28 kg/m   Physical Exam  Constitutional: She is oriented to person, place, and time. She appears well-developed and well-nourished. No distress.  HENT:  Head: Normocephalic.  Eyes: Pupils are equal, round, and reactive to light. Conjunctivae are normal. No scleral icterus.  Neck: Normal range of motion. Neck supple. No thyromegaly present.  Cardiovascular: Normal rate and regular rhythm.  Exam reveals no gallop and no friction rub.   No murmur heard. Pulmonary/Chest: Effort normal and breath sounds normal. No respiratory distress. She has no wheezes. She has no rales.  Xiphoid tenderness. Clear lungs.  Abdominal: Soft. Bowel sounds are normal. She exhibits no distension. There is no tenderness. There is no rebound.  Musculoskeletal: Normal range of motion.  Complains of tenderness to the musculature of her lower legs. No tight  compartments. Strong pulses distally. No swelling. To palpate in her xiphoid in her anterior chest.  Neurological: She is alert and oriented to person, place, and time.  Skin: Skin is warm and dry. No rash noted.  Psychiatric: She has a normal mood and affect. Her behavior is normal.     ED Treatments / Results  Labs (all labs ordered are listed, but only abnormal results are displayed) Labs Reviewed  BASIC METABOLIC PANEL - Abnormal; Notable for the following:       Result Value   Glucose, Bld 102 (*)    Calcium 8.8 (*)    All other components within normal limits  CBC  CK  SEDIMENTATION RATE  D-DIMER, QUANTITATIVE (NOT AT [facility])  I-STAT TROPONIN, ED    EKG  EKG Interpretation  Date/Time:  Sunday April 29 2017 08:05:32 EDT Ventricular Rate:  74 PR Interval:  144 QRS Duration: 84 QT Interval:  382 QTC Calculation: 424 R Axis:   50 Text Interpretation:  Normal sinus rhythm Normal ECG Confirmed by Tanna Furry [phone]) on 04/29/2017 9:25:52 AM       Radiology Dg Chest 2 View  Result Date: 04/29/2017 CLINICAL DATA:  Patient states "about one week ive been short of breath, my chest has been so sore and right through the right side of my neck, and the back of my shoulder hurts, my feet have been swollen, and both my legs have been hurting, a dull pain in the back of my legs."Hx of breast ca, HTN, stroke, TIA, pneumonia EXAM: CHEST  2 VIEW COMPARISON:  10/19/2016 FINDINGS: Cardiac silhouette is normal in size. No mediastinal or hilar masses for evidence of adenopathy. Lungs are hyperexpanded. There are prominent bronchovascular markings, stable. No evidence of pneumonia or pulmonary edema. No pleural effusion or pneumothorax. Skeletal structures are demineralized but intact. IMPRESSION: No acute cardiopulmonary disease.  COPD. Electronically Signed   By: Lajean Manes M.D.   On: 04/29/2017 08:26    Procedures Procedures (including critical care time)  Medications Ordered in  ED Medications  morphine 4  MG/ML injection 4 mg (4 mg Intravenous Given 04/29/17 1059)  ketorolac (TORADOL) 30 MG/ML injection 30 mg (30 mg Intravenous Given 04/29/17 1059)  ondansetron (ZOFRAN) injection 4 mg (4 mg Intravenous Given 04/29/17 1059)  methocarbamol (ROBAXIN) 1,000 mg in dextrose 5 % 50 mL IVPB (1,000 mg Intravenous New Bag/Given 04/29/17 1122)     Initial Impression / Assessment and Plan / ED Course  I have reviewed the triage vital signs and the nursing notes.  Pertinent labs & imaging results that were available during my care of the patient were reviewed by me and considered in my medical decision making (see chart for details).   plan symptomatically treatment. Troponins, EKG, chest x-ray, CK, sedimentation rate. Antiviral torus pain medicines. We will reevaluate.  Final Clinical Impressions(s) / ED Diagnoses   Final diagnoses:  Myalgia    Troponin and EKG normal. Negative d-dimer. Pain in her sternal area is reproducible with tenderness on palpation. CPK normal. Normal sedimentation rate. Doubt myositis or rhabdomyolysis.  Is cusp possible viral etiology. Discussed possibilities such as fibromyalgia or chronic muscle pain. Plan anti-inflammatory muscle relaxant and primary care follow-up.  Patient does not have weakness. She has normal DTRs normal strength. No indications to suggest Guillan-Barre. No bulbar weakness. No vision changes.  New Prescriptions New Prescriptions   METHOCARBAMOL (ROBAXIN) 500 MG TABLET    Take 1 tablet (500 mg total) by mouth 3 (three) times daily between meals as needed.   NAPROXEN (NAPROSYN) 500 MG TABLET    Take 1 tablet (500 mg total) by mouth 2 (two) times daily.     Tanna Furry, MD 04/29/17 1209

## 2017-05-01 ENCOUNTER — Other Ambulatory Visit: Payer: Self-pay

## 2017-05-01 ENCOUNTER — Emergency Department (HOSPITAL_COMMUNITY)
Admission: EM | Admit: 2017-05-01 | Discharge: 2017-05-01 | Disposition: A | Discharge status: Home / Self Care | Payer: Medicaid Other | Attending: Emergency Medicine | Admitting: Emergency Medicine

## 2017-05-01 ENCOUNTER — Emergency Department (HOSPITAL_COMMUNITY): Payer: Medicaid Other

## 2017-05-01 DIAGNOSIS — R4781 Slurred speech: Secondary | ICD-10-CM

## 2017-05-01 DIAGNOSIS — I10 Essential (primary) hypertension: Secondary | ICD-10-CM | POA: Insufficient documentation

## 2017-05-01 DIAGNOSIS — Z79899 Other long term (current) drug therapy: Secondary | ICD-10-CM | POA: Diagnosis not present

## 2017-05-01 DIAGNOSIS — F17210 Nicotine dependence, cigarettes, uncomplicated: Secondary | ICD-10-CM | POA: Insufficient documentation

## 2017-05-01 DIAGNOSIS — Z8673 Personal history of transient ischemic attack (TIA), and cerebral infarction without residual deficits: Secondary | ICD-10-CM | POA: Diagnosis not present

## 2017-05-01 DIAGNOSIS — Z7982 Long term (current) use of aspirin: Secondary | ICD-10-CM | POA: Insufficient documentation

## 2017-05-01 DIAGNOSIS — F419 Anxiety disorder, unspecified: Secondary | ICD-10-CM | POA: Diagnosis not present

## 2017-05-01 DIAGNOSIS — I639 Cerebral infarction, unspecified: Secondary | ICD-10-CM | POA: Diagnosis present

## 2017-05-01 DIAGNOSIS — R479 Unspecified speech disturbances: Secondary | ICD-10-CM | POA: Diagnosis not present

## 2017-05-01 LAB — I-STAT TROPONIN, ED: TROPONIN I, POC: 0 ng/mL (ref 0.00–0.08)

## 2017-05-01 LAB — PROTIME-INR
INR: 0.86
Prothrombin Time: 11.7 seconds (ref 11.4–15.2)

## 2017-05-01 LAB — DIFFERENTIAL
BASOS ABS: 0 10*3/uL (ref 0.0–0.1)
BASOS PCT: 0 %
EOS ABS: 0.2 10*3/uL (ref 0.0–0.7)
EOS PCT: 3 %
LYMPHS ABS: 1.7 10*3/uL (ref 0.7–4.0)
Lymphocytes Relative: 26 %
MONOS PCT: 8 %
Monocytes Absolute: 0.5 10*3/uL (ref 0.1–1.0)
Neutro Abs: 4 10*3/uL (ref 1.7–7.7)
Neutrophils Relative %: 63 %

## 2017-05-01 LAB — RAPID URINE DRUG SCREEN, HOSP PERFORMED
Amphetamines: NOT DETECTED
Barbiturates: NOT DETECTED
Benzodiazepines: POSITIVE — AB
Cocaine: NOT DETECTED
OPIATES: POSITIVE — AB
TETRAHYDROCANNABINOL: POSITIVE — AB

## 2017-05-01 LAB — COMPREHENSIVE METABOLIC PANEL
ALT: 23 U/L (ref 14–54)
ANION GAP: 9 (ref 5–15)
AST: 22 U/L (ref 15–41)
Albumin: 3.9 g/dL (ref 3.5–5.0)
Alkaline Phosphatase: 61 U/L (ref 38–126)
BUN: 5 mg/dL — ABNORMAL LOW (ref 6–20)
CO2: 22 mmol/L (ref 22–32)
Calcium: 9.1 mg/dL (ref 8.9–10.3)
Chloride: 111 mmol/L (ref 101–111)
Creatinine, Ser: 0.78 mg/dL (ref 0.44–1.00)
Glucose, Bld: 111 mg/dL — ABNORMAL HIGH (ref 65–99)
POTASSIUM: 3.7 mmol/L (ref 3.5–5.1)
Sodium: 142 mmol/L (ref 135–145)
TOTAL PROTEIN: 6.6 g/dL (ref 6.5–8.1)
Total Bilirubin: 0.4 mg/dL (ref 0.3–1.2)

## 2017-05-01 LAB — CBC
HCT: 40.2 % (ref 36.0–46.0)
HEMOGLOBIN: 13.4 g/dL (ref 12.0–15.0)
MCH: 28.8 pg (ref 26.0–34.0)
MCHC: 33.3 g/dL (ref 30.0–36.0)
MCV: 86.3 fL (ref 78.0–100.0)
Platelets: 207 10*3/uL (ref 150–400)
RBC: 4.66 MIL/uL (ref 3.87–5.11)
RDW: 13.5 % (ref 11.5–15.5)
WBC: 6.5 10*3/uL (ref 4.0–10.5)

## 2017-05-01 LAB — CBG MONITORING, ED: GLUCOSE-CAPILLARY: 104 mg/dL — AB (ref 65–99)

## 2017-05-01 LAB — APTT: APTT: 26 s (ref 24–36)

## 2017-05-01 MED ORDER — HYDROXYZINE HCL 25 MG PO TABS
25.0000 mg | ORAL_TABLET | Freq: Once | ORAL | Status: AC
  Administered 2017-05-01: 25 mg via ORAL
  Filled 2017-05-01: qty 1

## 2017-05-01 NOTE — Code Documentation (Signed)
This afternoon while she was speaking with her sister on the phone she became difficult to understand.  LKW at [place]  She arrived via private vehicle at 1645.  Upon arrival staff noted difficulty word finding, facial droop and some right weakness.   Stat labs and head CT done.  Dr Rory Percy at bedside to assess patient.  NIHSS 1  Mild sensory loss left leg.  Plan MRI

## 2017-05-01 NOTE — ED Notes (Signed)
Patient able to ambulate independently  

## 2017-05-01 NOTE — ED Provider Notes (Signed)
4:54 PM Patient arrived as a code stoke. Hx previous TIAs Patient states that at about 30 minutes prior to arrival she was talking with her sister when she suddenly was having significant difficulty getting her speech out. She denies headaches, changes in vision. She did have some ataxia. On initial screening examination the patient is alert and oriented. Her speech is intermittently difficult to understand and she has difficulty getting her words out. Appeared to have some right arm pronator drift. She has no airway compromise and is safe to go to CT scan.   Margarita Mail, PA-C 05/01/17 1737    Little, Wenda Overland, MD 05/03/17 [phone]

## 2017-05-01 NOTE — ED Provider Notes (Signed)
[facility] DEPT Provider Note   CSN: [id number] Arrival date & time: 05/01/17  1645   An emergency department physician performed an initial assessment on this suspected stroke patient at 1657.  History   Chief Complaint Chief Complaint  Patient presents with  . Code Stroke    HPI [name] is a 61 y.o. female.  HPI Patient with aphasia while talking to her sister roughly around 4:30 PM this afternoon. Symptoms have since resolved. Patient complains of generalized weakness but does not show side preference. No visual changes. Admits to increased stress at home and was seen yesterday in the emergency department and given resources for outpatient counselor. Was seen by neurology and code stroke canceled. Past Medical History:  Diagnosis Date  . Adjustment disorder with mixed anxiety and depressed mood 01/05/2013  . Allergy   . Anxiety   . Arthritis    "knees,wrists" (07/31/2014)  . Brain aneurysm 2012   2cms.  . Breast disorder    lump in left breast  . Carcinoid tumor   . Complication of anesthesia    hallucinations , anxiety   . Depression   . H/O echocardiogram    not finding report in EPIC, pt. reports that it was before her appt. /w Dr. Johnsie Cancel   . Hepatitis B dx'd ~ 1980  . Hepatitis C dx'd ~ 2000   "specialist says it cleared itself on it's own"  . Hx of radiation therapy 07/17/13-09/03/13   left breast,60.4Gy, 32/33 fx completed  . Hyperlipidemia   . Hypertension    "never took my RX" (07/31/2014)  . Jaundice   . 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 Pain management    goes to [facility] clinic, but she doesn't intend to return   . Stroke [facility]) 2012   TIA  . TIA (transient ischemic attack) 06/2011   "can't see real well out of left eye since; weaker on left side since"  . Varicose veins   . Walking pneumonia ~ 2008    Patient Active Problem List   Diagnosis Date Noted  . Head lice infestation 86/57/8469  .  Chest pain 07/31/2014  . 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 Partial small bowel obstruction ([place]) 04/07/2013  . SBO (small bowel obstruction) ([place]) 01/05/2013  . Adjustment disorder with mixed anxiety and depressed mood 01/05/2013  . Chronic pain syndrome 01/05/2013  . Thyroid cyst 01/05/2013  . Smoking 12/05/2012  . Hepatitis C   . Jaundice   . Hyperlipidemia   . Varicose veins   . Brain aneurysm   . TIA (transient ischemic attack) 06/12/2011    Past Surgical History:  Procedure Laterality Date  . BREAST BIOPSY Left 03/2013; 05/2013  . BREAST LUMPECTOMY Left 04/02/2013   Procedure: Excision  of Left Breast Mass;  Surgeon: Harl Bowie, MD;  Location: [place];  Service: General;  Laterality: Left;  . BREAST LUMPECTOMY WITH SENTINEL LYMPH NODE BIOPSY Left 06/05/2013   Procedure: BREAST LUMPECTOMY WITH SENTINEL LYMPH NODE BX;  Surgeon: Harl Bowie, MD;  Location: [place];  Service: General;  Laterality: Left;  . COLON SURGERY  03/2013   bowel obstruction , malignant tumor    . DILATION AND CURETTAGE OF UTERUS  ~ 1990   S/P miscarriage  . EVACUATION BREAST HEMATOMA Left 06/05/2013   Procedure: EVACUATION HEMATOMA BREAST;  Surgeon: Harl Bowie, MD;  Location: [facility]  OR;  Service: General;  Laterality: Left;  . LAPAROSCOPY N/A 04/02/2013   Procedure: Diaganostic Lap.; Small Bowel Resection;  Surgeon: Harl Bowie, MD;  Location: [place];  Service: General;  Laterality: N/A;  . TUMOR EXCISION  04/02/2013   "carcoid tumor removed"  . vaginal births   52    birthed twins at -[redacted] weeks gestation - both survived , total of 3 pregnancies - all born vaginally    OB History    Gravida Para Term Preterm AB Living   5 4   2 1 4    SAB TAB Ectopic Multiple Live Births     1             Home Medications    Prior to Admission medications   Medication Sig Start Date End Date Taking? Authorizing  Provider  ALPRAZolam Duanne Moron) 1 MG tablet Take 1 tablet (1 mg total) by mouth 2 (two) times daily as needed for anxiety. 06/04/15  Yes Nicholas Lose, MD  aspirin EC 81 MG tablet Take 81 mg by mouth daily.   Yes [provider]  Melatonin 10 MG SUBL Place 1 tablet under the tongue at bedtime as needed (sleep).   Yes [provider]  methocarbamol (ROBAXIN) 500 MG tablet Take 1 tablet (500 mg total) by mouth 3 (three) times daily between meals as needed. 04/29/17  Yes Tanna Furry, MD  naproxen (NAPROSYN) 500 MG tablet Take 1 tablet (500 mg total) by mouth 2 (two) times daily. Patient not taking: Reported on 05/01/2017 04/29/17   Tanna Furry, MD  permethrin Nancy Fetter) 5 % cream Apply to affected area once Patient not taking: Reported on 10/19/2016 04/04/16   Konrad Felix, PA    Family History Family History  Problem Relation Age of Onset  . Diabetes Paternal Grandfather   . Diabetes Paternal Grandmother   . Diabetes Mother   . Heart disease Mother   . Cancer Mother        cervical  . Diabetes Brother   . Stroke Brother   . Heart disease Sister   . Diabetes Sister   . Cancer Sister        cervical  . Cancer Father        lung    Social History Social History  Substance Use Topics  . Smoking status: Current Every Day Smoker    Packs/day: 0.50    Years: 36.00    Types: Cigarettes  . Smokeless tobacco: Never Used  . Alcohol use 2.4 oz/week    4 Glasses of wine per week     Allergies   Statins; Ace inhibitors; Sertraline hcl; Ciprofloxacin hcl; and Tramadol   Review of Systems Review of Systems  Constitutional: Negative for chills and fever.  Eyes: Negative for photophobia and visual disturbance.  Respiratory: Negative for cough and shortness of breath.   Cardiovascular: Negative for chest pain.  Gastrointestinal: Negative for abdominal pain, diarrhea, nausea and vomiting.  Genitourinary: Negative for dysuria, flank pain and frequency.  Musculoskeletal:  Positive for myalgias. Negative for back pain, neck pain and neck stiffness.  Skin: Negative for rash and wound.  Neurological: Positive for speech difficulty. Negative for dizziness, light-headedness, numbness and headaches. Weakness: generalized.     Physical Exam Updated Vital Signs BP (!) 142/99   Pulse 81   Resp 18   SpO2 98%   Physical Exam  Constitutional: She is oriented to person, place, and time. She appears well-developed and well-nourished. No distress.  Anxious  appearing  HENT:  Head: Normocephalic and atraumatic.  Mouth/Throat: Oropharynx is clear and moist. No oropharyngeal exudate.  Eyes: Pupils are equal, round, and reactive to light. EOM are normal.  Neck: Normal range of motion. Neck supple.  No bruits appreciated. No meningismus  Cardiovascular: Normal rate and regular rhythm.  Exam reveals no gallop and no friction rub.   No murmur heard. Pulmonary/Chest: Effort normal and breath sounds normal. No respiratory distress. She has no wheezes. She has no rales. She exhibits no tenderness.  Abdominal: Soft. Bowel sounds are normal. There is no tenderness. There is no rebound and no guarding.  Musculoskeletal: Normal range of motion. She exhibits no edema or tenderness.  No midline thoracic or lumbar tenderness. No lower extremity swelling, asymmetry or tenderness. Distal pulses are 2+.  Lymphadenopathy:    She has no cervical adenopathy.  Neurological: She is alert and oriented to person, place, and time.  Patient is alert and oriented x3 with clear, goal oriented speech. Patient has 5/5 motor in all extremities. Sensation is intact to light touch. Bilateral finger-to-nose is normal with no signs of dysmetria. Patient has a normal gait and walks without assistance.  Skin: Skin is warm and dry. No rash noted. She is not diaphoretic. No erythema.  Psychiatric: She has a normal mood and affect. Her behavior is normal.  Nursing note and vitals reviewed.    ED  Treatments / Results  Labs (all labs ordered are listed, but only abnormal results are displayed) Labs Reviewed  COMPREHENSIVE METABOLIC PANEL - Abnormal; Notable for the following:       Result Value   Glucose, Bld 111 (*)    BUN 5 (*)    All other components within normal limits  RAPID URINE DRUG SCREEN, HOSP PERFORMED - Abnormal; Notable for the following:    Opiates POSITIVE (*)    Benzodiazepines POSITIVE (*)    Tetrahydrocannabinol POSITIVE (*)    All other components within normal limits  CBG MONITORING, ED - Abnormal; Notable for the following:    Glucose-Capillary 104 (*)    All other components within normal limits  PROTIME-INR  APTT  CBC  DIFFERENTIAL  I-STAT TROPONIN, ED  I-STAT CHEM 8, ED    EKG  EKG Interpretation None       Radiology Mr Brain Wo Contrast  Result Date: 05/01/2017 CLINICAL DATA:  61 y/o  F; slurred speech, evaluation of stroke. EXAM: MRI HEAD WITHOUT CONTRAST TECHNIQUE: Multiplanar, multiecho pulse sequences of the brain and surrounding structures were obtained without intravenous contrast. COMPARISON:  05/01/2017 CT head FINDINGS: Brain: No acute infarction, hemorrhage, hydrocephalus, extra-axial collection or mass lesion. Stable mild nonspecific T2 FLAIR hyperintense signal abnormality in periventricular white matter compatible with minimal chronic microvascular ischemic changes. Vascular: Normal flow voids. Skull and upper cervical spine: Normal marrow signal. Sinuses/Orbits: Mild diffuse paranasal sinus mucosal thickening. No abnormal signal of mastoid air cells. Orbits are unremarkable. Other: None. IMPRESSION: No acute intracranial abnormality identified. Unremarkable MRI of the brain for age. Electronically Signed   By: Kristine Garbe M.D.   On: 05/01/2017 19:54   Ct Head Code Stroke Wo Contrast  Result Date: 05/01/2017 CLINICAL DATA:  Code stroke.  Facial droop and slurred speech. EXAM: CT HEAD WITHOUT CONTRAST TECHNIQUE:  Contiguous axial images were obtained from the base of the skull through the vertex without intravenous contrast. COMPARISON:  03/13/2014 FINDINGS: Brain: There is no evidence of acute infarct, intracranial hemorrhage, mass, midline shift, or extra-axial fluid collection. The ventricles  and sulci are normal. Vascular: No hyperdense vessel. Skull: No fracture or focal osseous lesion. Sinuses/Orbits: Minimal left maxillary and left sphenoid sinus mucosal thickening. Clear mastoid air cells. Unremarkable orbits. Other: None. ASPECTS [facility] Stroke Program Early CT Score) - Ganglionic level infarction (caudate, lentiform nuclei, internal capsule, insula, M1-M3 cortex): 7 - Supraganglionic infarction (M4-M6 cortex): 3 Total score (0-10 with 10 being normal): 10 IMPRESSION: 1. No evidence of acute intracranial abnormality. Unremarkable CT appearance of the brain. 2. ASPECTS is 10. Results sent via text page to Dr. Rory Percy on 05/01/2017 5:13 p.m. Electronically Signed   By: Logan Bores M.D.   On: 05/01/2017 17:14    Procedures Procedures (including critical care time)  Medications Ordered in ED Medications  hydrOXYzine (ATARAX/VISTARIL) tablet 25 mg (25 mg Oral Given 05/01/17 2042)     Initial Impression / Assessment and Plan / ED Course  I have reviewed the triage vital signs and the nursing notes.  Pertinent labs & imaging results that were available during my care of the patient were reviewed by me and considered in my medical decision making (see chart for details).     MRI with no acute findings. Patient symptoms thought to be psychosomatic. Advised to follow-up with psychologist as an outpatient.  Final Clinical Impressions(s) / ED Diagnoses   Final diagnoses:  Difficulty with speech  Anxiety    New Prescriptions New Prescriptions   No medications on file     Julianne Rice, MD 05/01/17 2048

## 2017-05-01 NOTE — ED Notes (Signed)
Pharmacy at bedside

## 2017-05-01 NOTE — ED Notes (Signed)
Patient transported to  mri 

## 2017-05-01 NOTE — Consult Note (Addendum)
Neurology Consultation  Reason for Consult: Acute code stroke Referring Physician: Dr. Lita Mains  CC:   History is obtained from: Chart and the patient  HPI: [name] is a 61 y.o. female was a past medical history of anxiety, depression, hepatitis B, hepatitis C, hypertension, hyperlipidemia documented history of stroke with residual deficits , who came into the emergency room for evaluation of slurred speech. According to the patient, she was in her usual state of health until about this afternoon when she started noticing that she is unable to speak and when she tries to speak she has her words coming out slurred. Denies any headache or visual disturbances. Also reports feeling weak all over with no preference to one side or the other. Denies any preceding fevers, chills, nausea vomiting associated with this. Denies any head trauma. She was here in the emergency room yesterday, evaluated for chest pain, shortness of breath and leg pain. She was discharged home after working her up for ACS which was negative as well as given prescription for muscle relaxants take at home. She reports taking her Robaxin does this afternoon, after which she noticed the symptoms. She is on alprazolam 1 mg twice a day when necessary, which she reports not abusing. Of note, she has been evaluated by neurology once in 2013 at [facility] with symptoms of left-sided numbness and weakness. She received workup in the ER that included CT and CTA of the head and neck which were unremarkable and she was discharged home with outpatient follow-up with primary care. Her symptoms were attributed to ongoing psychosocial events.   LKW: Around 3 PM today tpa given?: no, low NIH, unlikely stroke Premorbid modified Rankin scale (mRS):  0-Completely asymptomatic and back to baseline post-stroke   ROS: A 14 point ROS was performed and is negative except as noted in the HPI.   Past Medical History:  Diagnosis Date  .  Adjustment disorder with mixed anxiety and depressed mood 01/05/2013  . Allergy   . Anxiety   . Arthritis    "knees,wrists" (07/31/2014)  . Brain aneurysm 2012   2cms.  . Breast disorder    lump in left breast  . Carcinoid tumor   . Complication of anesthesia    hallucinations , anxiety   . Depression   . H/O echocardiogram    not finding report in EPIC, pt. reports that it was before her appt. /w Dr. Johnsie Cancel   . Hepatitis B dx'd ~ 1980  . Hepatitis C dx'd ~ 2000   "specialist says it cleared itself on it's own"  . Hx of radiation therapy 07/17/13-09/03/13   left breast,60.4Gy, 32/33 fx completed  . Hyperlipidemia   . Hypertension    "never took my RX" (07/31/2014)  . Jaundice   . Malignant neoplasm of lower-outer quadrant of female breast ([place]) 05/19/2013  . Pain management    goes to [facility] clinic, but she doesn't intend to return   . Stroke [facility]) 2012   TIA  . TIA (transient ischemic attack) 06/2011   "can't see real well out of left eye since; weaker on left side since"  . Varicose veins   . Walking pneumonia ~ 2008    Family History  Problem Relation Age of Onset  . Diabetes Paternal Grandfather   . Diabetes Paternal Grandmother   . Diabetes Mother   . Heart disease Mother   . Cancer Mother        cervical  . Diabetes Brother   .  Stroke Brother   . Heart disease Sister   . Diabetes Sister   . Cancer Sister        cervical  . Cancer Father        lung    Social History:   reports that she has been smoking Cigarettes.  She has a 18.00 pack-year smoking history. She has never used smokeless tobacco. She reports that she drinks about 2.4 oz of alcohol per week . She reports that she uses drugs, including Marijuana.   Medications No current facility-administered medications for this encounter.   Current Outpatient Prescriptions:  .  ALPRAZolam (XANAX) 1 MG tablet, Take 1 tablet (1 mg total) by mouth 2 (two) times daily as needed for anxiety., Disp: 45 tablet,  Rfl: 0 .  aspirin EC 81 MG tablet, Take 81 mg by mouth daily., Disp: , Rfl:  .  methocarbamol (ROBAXIN) 500 MG tablet, Take 1 tablet (500 mg total) by mouth 3 (three) times daily between meals as needed., Disp: 20 tablet, Rfl: 0 .  naproxen (NAPROSYN) 500 MG tablet, Take 1 tablet (500 mg total) by mouth 2 (two) times daily., Disp: 30 tablet, Rfl: 0 .  permethrin (ELIMITE) 5 % cream, Apply to affected area once (Patient not taking: Reported on 10/19/2016), Disp: 60 g, Rfl: 1   Exam: Current vital signs: There were no vitals taken for this visit. Vital signs in last 24 hours:    GENERAL: Awake, alert in NAD HEENT: - Normocephalic and atraumatic, dry mm, no LN++, no Thyromegally LUNGS - Clear to auscultation bilaterally with no wheezes CV - S1S2 RRR, no m/r/g, equal pulses bilaterally. ABDOMEN - Soft, nontender, nondistended with normoactive BS Ext: warm, well perfused, intact peripheral pulses, no edema NEURO:  Mental Status: AA&Ox3  Language: speech is clear for most part but does have some stuttering occasionally.  Naming, repetition, fluency, and comprehension intact. Cranial Nerves: PERRL 11mm/brisk. EOMI, visual fields full, no facial asymmetry, facial sensation intact, hearing intact, tongue/uvula/soft palate midline, normal sternocleidomastoid and trapezius muscle strength. No evidence of tongue atrophy or fibrillations Motor: 5/5 all over with no drift. Tone: is normal and bulk is normal Sensation- Intact to light touch bilaterally with the exception of mild decrease subjectively to light touch on the left lower leg. Coordination: FTN intact bilaterally, no ataxia in BLE. Gait- deferred  NIHSS 1 for subjective sensory  Labs I have reviewed labs in epic and the results pertinent to this consultation are:   CBC    Component Value Date/Time   WBC 6.5 05/01/2017 1652   RBC 4.66 05/01/2017 1652   HGB 13.4 05/01/2017 1652   HGB 14.0 02/16/2017 0827   HCT 40.2 05/01/2017 1652    HCT 42.5 02/16/2017 0827   PLT 207 05/01/2017 1652   PLT 192 02/16/2017 0827   MCV 86.3 05/01/2017 1652   MCV 88.8 02/16/2017 0827   MCH 28.8 05/01/2017 1652   MCHC 33.3 05/01/2017 1652   RDW 13.5 05/01/2017 1652   RDW 13.5 02/16/2017 0827   LYMPHSABS 1.7 05/01/2017 1652   LYMPHSABS 1.3 02/16/2017 0827   MONOABS 0.5 05/01/2017 1652   MONOABS 0.4 02/16/2017 0827   EOSABS 0.2 05/01/2017 1652   EOSABS 0.1 02/16/2017 0827   BASOSABS 0.0 05/01/2017 1652   BASOSABS 0.0 02/16/2017 0827    CMP     Component Value Date/Time   NA 141 04/29/2017 0811   NA 144 02/16/2017 0827   K 4.3 04/29/2017 0811   K 4.5 02/16/2017 0827  CL 108 04/29/2017 0811   CO2 24 04/29/2017 0811   CO2 27 02/16/2017 0827   GLUCOSE 102 (H) 04/29/2017 0811   GLUCOSE 107 02/16/2017 0827   BUN 10 04/29/2017 0811   BUN 11.1 02/16/2017 0827   CREATININE 0.75 04/29/2017 0811   CREATININE 0.8 02/16/2017 0827   CALCIUM 8.8 (L) 04/29/2017 0811   CALCIUM 9.7 02/16/2017 0827   PROT 6.7 02/16/2017 0827   ALBUMIN 3.9 02/16/2017 0827   AST 16 02/16/2017 0827   ALT 19 02/16/2017 0827   ALKPHOS 67 02/16/2017 0827   BILITOT 0.77 02/16/2017 0827   GFRNONAA >60 04/29/2017 0811   GFRAA >60 04/29/2017 2778    Imaging I have reviewed the images obtained:  CT-scan of the brain - shows no acute abnormality.   Assessment:  61 year old woman with a past medical history of anxiety, depression, hepatitis B and C, hypertension, hyperlipidemia, documented history of stroke with no residual deficits who came to the emergency room for evaluation of slurred speech Her symptoms are very nonspecific and her examination is very inconsistent but mostly normal. She has been evaluated in the past for left-sided numbness and weakness, which was deemed to be of a nonorganic nature after workup. I suspect she has similar underlying etiology at this time as well but would not be able to rule out a stroke without an MRI. Her symptoms might  be more consistent with substance abuse and/or polypharmacy or most likely psychogenic.  Impression: evaluate for stroke-low suspicion Possible polypharmacy Substance abuse  Recommendations: Obtain a urinary drug screen Obtain an MRI of the brain limited for evaluation of stroke. I've taken the liberty to order that. If the MRI of the brain is negative for stroke, no further neurological workup is indicated at this time. She should follow up with primary care for anxiety depression, hypertension, hyperlipidemia. We will follow with you once the MRI is obtained. Please call with questions -- Amie Portland, MD [facility] [phone]  If 7pm to 7am, please call on call as listed on AMION.   ADDENDUM after MRI No evidence of stroke or bleed on MRI. No further neurological w/u at this time. Utox +ve for Benzo, Opiate and THC. Rest of the recs as above.  -- Amie Portland, MD [facility] [phone]  If 7pm to 7am, please call on call as listed on AMION.

## 2017-05-01 NOTE — ED Notes (Signed)
Patient continues to be in MRI

## 2017-06-26 ENCOUNTER — Other Ambulatory Visit: Payer: Self-pay | Admitting: Hematology and Oncology

## 2017-06-26 DIAGNOSIS — Z9889 Other specified postprocedural states: Secondary | ICD-10-CM

## 2017-07-02 ENCOUNTER — Ambulatory Visit
Admission: RE | Admit: 2017-07-02 | Discharge: 2017-07-02 | Disposition: A | Discharge status: Home / Self Care | Payer: Medicaid Other | Source: Ambulatory Visit | Attending: Hematology and Oncology | Admitting: Hematology and Oncology

## 2017-07-02 DIAGNOSIS — Z9889 Other specified postprocedural states: Secondary | ICD-10-CM

## 2017-07-02 HISTORY — DX: Malignant neoplasm of unspecified site of unspecified female breast: C50.919

## 2017-07-02 HISTORY — DX: Personal history of irradiation: Z92.3

## 2017-07-20 ENCOUNTER — Encounter (HOSPITAL_COMMUNITY): Payer: Self-pay

## 2017-12-23 ENCOUNTER — Other Ambulatory Visit: Payer: Self-pay

## 2017-12-23 ENCOUNTER — Inpatient Hospital Stay (HOSPITAL_COMMUNITY)
Admission: EM | Admit: 2017-12-23 | Discharge: 2017-12-25 | DRG: 287 | Disposition: A | Discharge status: Home / Self Care | Payer: Medicaid Other | Attending: Cardiovascular Disease | Admitting: Cardiovascular Disease

## 2017-12-23 ENCOUNTER — Encounter (HOSPITAL_COMMUNITY): Payer: Self-pay

## 2017-12-23 ENCOUNTER — Emergency Department (HOSPITAL_COMMUNITY): Payer: Medicaid Other

## 2017-12-23 DIAGNOSIS — Z888 Allergy status to other drugs, medicaments and biological substances status: Secondary | ICD-10-CM | POA: Diagnosis not present

## 2017-12-23 DIAGNOSIS — E7800 Pure hypercholesterolemia, unspecified: Secondary | ICD-10-CM | POA: Diagnosis not present

## 2017-12-23 DIAGNOSIS — Z720 Tobacco use: Secondary | ICD-10-CM | POA: Diagnosis not present

## 2017-12-23 DIAGNOSIS — I639 Cerebral infarction, unspecified: Secondary | ICD-10-CM | POA: Diagnosis not present

## 2017-12-23 DIAGNOSIS — Z85028 Personal history of other malignant neoplasm of stomach: Secondary | ICD-10-CM

## 2017-12-23 DIAGNOSIS — F419 Anxiety disorder, unspecified: Secondary | ICD-10-CM | POA: Diagnosis present

## 2017-12-23 DIAGNOSIS — R7989 Other specified abnormal findings of blood chemistry: Secondary | ICD-10-CM

## 2017-12-23 DIAGNOSIS — Z923 Personal history of irradiation: Secondary | ICD-10-CM | POA: Diagnosis not present

## 2017-12-23 DIAGNOSIS — Z79899 Other long term (current) drug therapy: Secondary | ICD-10-CM

## 2017-12-23 DIAGNOSIS — Z791 Long term (current) use of non-steroidal anti-inflammatories (NSAID): Secondary | ICD-10-CM | POA: Diagnosis not present

## 2017-12-23 DIAGNOSIS — I5181 Takotsubo syndrome: Principal | ICD-10-CM | POA: Diagnosis present

## 2017-12-23 DIAGNOSIS — Z8673 Personal history of transient ischemic attack (TIA), and cerebral infarction without residual deficits: Secondary | ICD-10-CM | POA: Diagnosis not present

## 2017-12-23 DIAGNOSIS — I214 Non-ST elevation (NSTEMI) myocardial infarction: Secondary | ICD-10-CM

## 2017-12-23 DIAGNOSIS — I5030 Unspecified diastolic (congestive) heart failure: Secondary | ICD-10-CM | POA: Diagnosis not present

## 2017-12-23 DIAGNOSIS — R931 Abnormal findings on diagnostic imaging of heart and coronary circulation: Secondary | ICD-10-CM | POA: Diagnosis present

## 2017-12-23 DIAGNOSIS — Z7982 Long term (current) use of aspirin: Secondary | ICD-10-CM | POA: Diagnosis not present

## 2017-12-23 DIAGNOSIS — R748 Abnormal levels of other serum enzymes: Secondary | ICD-10-CM | POA: Diagnosis not present

## 2017-12-23 DIAGNOSIS — R9430 Abnormal result of cardiovascular function study, unspecified: Secondary | ICD-10-CM | POA: Diagnosis present

## 2017-12-23 DIAGNOSIS — I25110 Atherosclerotic heart disease of native coronary artery with unstable angina pectoris: Secondary | ICD-10-CM | POA: Diagnosis present

## 2017-12-23 DIAGNOSIS — F17210 Nicotine dependence, cigarettes, uncomplicated: Secondary | ICD-10-CM | POA: Diagnosis present

## 2017-12-23 DIAGNOSIS — E785 Hyperlipidemia, unspecified: Secondary | ICD-10-CM | POA: Diagnosis present

## 2017-12-23 DIAGNOSIS — I10 Essential (primary) hypertension: Secondary | ICD-10-CM | POA: Diagnosis present

## 2017-12-23 DIAGNOSIS — Z853 Personal history of malignant neoplasm of breast: Secondary | ICD-10-CM | POA: Diagnosis not present

## 2017-12-23 DIAGNOSIS — R778 Other specified abnormalities of plasma proteins: Secondary | ICD-10-CM

## 2017-12-23 LAB — TROPONIN I
TROPONIN I: 0.1 ng/mL — AB (ref <0.03)
TROPONIN I: 1.76 ng/mL — AB (ref <0.03)
Troponin I: 1.32 ng/mL (ref <0.03)

## 2017-12-23 LAB — RAPID URINE DRUG SCREEN, HOSP PERFORMED
Amphetamines: NOT DETECTED
BARBITURATES: NOT DETECTED
BENZODIAZEPINES: POSITIVE — AB
COCAINE: NOT DETECTED
OPIATES: POSITIVE — AB
Tetrahydrocannabinol: NOT DETECTED

## 2017-12-23 LAB — CBC
HCT: 39.6 % (ref 36.0–46.0)
Hemoglobin: 13.4 g/dL (ref 12.0–15.0)
MCH: 29.8 pg (ref 26.0–34.0)
MCHC: 33.8 g/dL (ref 30.0–36.0)
MCV: 88 fL (ref 78.0–100.0)
PLATELETS: 232 10*3/uL (ref 150–400)
RBC: 4.5 MIL/uL (ref 3.87–5.11)
RDW: 13.5 % (ref 11.5–15.5)
WBC: 7.8 10*3/uL (ref 4.0–10.5)

## 2017-12-23 LAB — BASIC METABOLIC PANEL
Anion gap: 10 (ref 5–15)
BUN: 15 mg/dL (ref 6–20)
CO2: 20 mmol/L — ABNORMAL LOW (ref 22–32)
CREATININE: 0.69 mg/dL (ref 0.44–1.00)
Calcium: 9.2 mg/dL (ref 8.9–10.3)
Chloride: 110 mmol/L (ref 101–111)
GFR calc Af Amer: >60 mL/min (ref >60)
Glucose, Bld: 94 mg/dL (ref 65–99)
Potassium: 3.6 mmol/L (ref 3.5–5.1)
Sodium: 140 mmol/L (ref 135–145)

## 2017-12-23 LAB — PROTIME-INR
INR: 0.91
Prothrombin Time: 12.1 seconds (ref 11.4–15.2)

## 2017-12-23 LAB — I-STAT TROPONIN, ED: Troponin i, poc: 0.09 ng/mL (ref 0.00–0.08)

## 2017-12-23 LAB — D-DIMER, QUANTITATIVE: D-Dimer, Quant: 0.35 ug/mL-FEU (ref 0.00–0.50)

## 2017-12-23 MED ORDER — MORPHINE SULFATE (PF) 4 MG/ML IV SOLN
4.0000 mg | Freq: Once | INTRAVENOUS | Status: AC
  Administered 2017-12-23: 4 mg via INTRAVENOUS
  Filled 2017-12-23: qty 1

## 2017-12-23 MED ORDER — HYDROXYZINE HCL 25 MG PO TABS: 25.0000 mg | ORAL_TABLET | Freq: Once | ORAL | Status: DC

## 2017-12-23 MED ORDER — HEPARIN (PORCINE) IN NACL 100-0.45 UNIT/ML-% IJ SOLN
1200.0000 [IU]/h | INTRAMUSCULAR | Status: DC
  Administered 2017-12-23: 1000 [IU]/h via INTRAVENOUS
  Filled 2017-12-23 (×3): qty 250

## 2017-12-23 MED ORDER — NITROGLYCERIN IN D5W 200-5 MCG/ML-% IV SOLN
0.0000 ug/min | Freq: Once | INTRAVENOUS | Status: AC
  Administered 2017-12-23: 5 ug/min via INTRAVENOUS
  Filled 2017-12-23: qty 250

## 2017-12-23 MED ORDER — SODIUM CHLORIDE 0.9 % IV SOLN
Freq: Once | INTRAVENOUS | Status: AC
  Administered 2017-12-23: 17:00:00 via INTRAVENOUS

## 2017-12-23 MED ORDER — ALPRAZOLAM 0.5 MG PO TABS
1.0000 mg | ORAL_TABLET | Freq: Once | ORAL | Status: AC
  Administered 2017-12-23: 1 mg via ORAL
  Filled 2017-12-23: qty 2

## 2017-12-23 MED ORDER — NITROGLYCERIN 2 % TD OINT
1.0000 [in_us] | TOPICAL_OINTMENT | Freq: Once | TRANSDERMAL | Status: AC
  Administered 2017-12-23: 1 [in_us] via TOPICAL
  Filled 2017-12-23: qty 1

## 2017-12-23 MED ORDER — HEPARIN BOLUS VIA INFUSION
4000.0000 [IU] | Freq: Once | INTRAVENOUS | Status: AC
  Administered 2017-12-23: 4000 [IU] via INTRAVENOUS
  Filled 2017-12-23: qty 4000

## 2017-12-23 MED ORDER — MORPHINE SULFATE (PF) 4 MG/ML IV SOLN
4.0000 mg | INTRAVENOUS | Status: DC | PRN
  Administered 2017-12-24 (×2): 4 mg via INTRAVENOUS
  Filled 2017-12-23 (×2): qty 1

## 2017-12-23 MED ORDER — NITROGLYCERIN 0.4 MG SL SUBL: 0.4000 mg | SUBLINGUAL_TABLET | SUBLINGUAL | Status: DC | PRN

## 2017-12-23 MED ORDER — ASPIRIN 81 MG PO CHEW
324.0000 mg | CHEWABLE_TABLET | Freq: Once | ORAL | Status: AC
  Administered 2017-12-23: 324 mg via ORAL
  Filled 2017-12-23: qty 4

## 2017-12-23 NOTE — Progress Notes (Signed)
Please contact cardiology fellow on call once patient arrive at [facility].   Hilbert Corrigan PA Pager: [phone]

## 2017-12-23 NOTE — ED Notes (Signed)
ED Provider at bedside. 

## 2017-12-23 NOTE — ED Provider Notes (Addendum)
[facility] DEPT Provider Note   CSN: [id number] Arrival date & time: 12/23/17  1441     History   Chief Complaint Chief Complaint  Patient presents with  . Chest Pain    HPI [name] is a 62 y.o. female.  HPI   62 year old female with past medical history as below including chronic anxiety, hepatitis, hypertension, chronic anxiety, here with chest pain.  The patient states that she has been under significantly increased stress recently.  She is been getting into frequent arguments with her son and his girlfriend, who lives with her.  She states she had the flu last week, and has had a mild chest pain since then, that is worse after coughing.  She has not had any persistent fevers.  No sputum production.  She got into an argument with her family today.  She states that she was yelling.  She began to feel slightly lightheaded and tried to walk outside.  She then reportedly syncopized and lost consciousness for less than 1 minute.  There is no seizure-like activity.  No tongue biting.  She denies any chest pain prior to her syncopal episode.  She states this has happened in the setting of stressors in the past.  Denies any increase in her chest pain over the last week.  It has been fairly constant.  Denies any alleviating factors.  No fevers or chills.  Past Medical History:  Diagnosis Date  . Adjustment disorder with mixed anxiety and depressed mood 01/05/2013  . Allergy   . Anxiety   . Arthritis    "knees,wrists" (07/31/2014)  . Brain aneurysm 2012   2cms.  . Breast cancer ([place]) 06/05/2013  . Breast disorder    lump in left breast  . Carcinoid tumor   . Complication of anesthesia    hallucinations , anxiety   . Depression   . H/O echocardiogram    not finding report in EPIC, pt. reports that it was before her appt. /w Dr. Johnsie Cancel   . Hepatitis B dx'd ~ 1980  . Hepatitis C dx'd ~ 2000   "specialist says it cleared itself on it's own"  . Hx  of radiation therapy 07/17/13-09/03/13   left breast,60.4Gy, 32/33 fx completed  . Hyperlipidemia   . Hypertension    "never took my RX" (07/31/2014)  . Jaundice   . Malignant neoplasm of lower-outer quadrant of female breast ([place]) 05/19/2013  . Pain management    goes to [facility] clinic, but she doesn't intend to return   . Personal history of radiation therapy    2014  . Stroke [facility]) 2012   TIA  . TIA (transient ischemic attack) 06/2011   "can't see real well out of left eye since; weaker on left side since"  . Varicose veins   . Walking pneumonia ~ 2008    Patient Active Problem List   Diagnosis Date Noted  . Head lice infestation 23/55/7322  . Chest pain 07/31/2014  . 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 Partial small bowel obstruction ([place]) 04/07/2013  . SBO (small bowel obstruction) ([place]) 01/05/2013  . Adjustment disorder with mixed anxiety and depressed mood 01/05/2013  . Chronic pain syndrome 01/05/2013  . Thyroid cyst 01/05/2013  . Smoking 12/05/2012  . Hepatitis C   . Jaundice   . Hyperlipidemia   . Varicose veins   . Brain aneurysm   . TIA (transient  ischemic attack) 06/12/2011    Past Surgical History:  Procedure Laterality Date  . BREAST BIOPSY Left 03/2013; 05/2013  . BREAST LUMPECTOMY Left 04/02/2013   Procedure: Excision  of Left Breast Mass;  Surgeon: Harl Bowie, MD;  Location: [place];  Service: General;  Laterality: Left;  . BREAST LUMPECTOMY WITH SENTINEL LYMPH NODE BIOPSY Left 06/05/2013   Procedure: BREAST LUMPECTOMY WITH SENTINEL LYMPH NODE BX;  Surgeon: Harl Bowie, MD;  Location: [place];  Service: General;  Laterality: Left;  . COLON SURGERY  03/2013   bowel obstruction , malignant tumor    . DILATION AND CURETTAGE OF UTERUS  ~ 1990   S/P miscarriage  . EVACUATION BREAST HEMATOMA Left 06/05/2013   Procedure: EVACUATION HEMATOMA BREAST;  Surgeon: Harl Bowie, MD;  Location: [place];  Service: General;  Laterality: Left;  . LAPAROSCOPY N/A 04/02/2013   Procedure: Diaganostic Lap.; Small Bowel Resection;  Surgeon: Harl Bowie, MD;  Location: [place];  Service: General;  Laterality: N/A;  . TUMOR EXCISION  04/02/2013   "carcoid tumor removed"  . vaginal births   17    birthed twins at -[redacted] weeks gestation - both survived , total of 3 pregnancies - all born vaginally     OB History    Gravida  5   Para  4   Term      Preterm  2   AB  1   Living  4     SAB      TAB  1   Ectopic      Multiple      Live Births               Home Medications    Prior to Admission medications   Medication Sig Start Date End Date Taking? Authorizing Provider  acetaminophen (TYLENOL) 500 MG tablet Take 1,500 mg by mouth daily as needed (back pain).   Yes [provider]  ALPRAZolam Duanne Moron) 1 MG tablet Take 1 tablet (1 mg total) by mouth 2 (two) times daily as needed for anxiety. 06/04/15  Yes Nicholas Lose, MD  aspirin EC 81 MG tablet Take 81 mg by mouth daily.   Yes [provider]  Melatonin 10 MG SUBL Place 1 tablet under the tongue at bedtime as needed (sleep).   Yes [provider]  methocarbamol (ROBAXIN) 500 MG tablet Take 1 tablet (500 mg total) by mouth 3 (three) times daily between meals as needed. Patient not taking: Reported on 12/23/2017 04/29/17   Tanna Furry, MD  naproxen (NAPROSYN) 500 MG tablet Take 1 tablet (500 mg total) by mouth 2 (two) times daily. Patient not taking: Reported on 12/23/2017 04/29/17   Tanna Furry, MD  permethrin (ELIMITE) 5 % cream Apply to affected area once Patient not taking: Reported on 12/23/2017 04/04/16   Konrad Felix, PA    Family History Family History  Problem Relation Age of Onset  . Diabetes Paternal Grandfather   . Diabetes Paternal Grandmother   . Diabetes Mother   . Heart disease Mother   . Cancer Mother        cervical  . Diabetes Brother   .  Stroke Brother   . Heart disease Sister   . Diabetes Sister   . Cancer Sister        cervical  . Cancer Father        lung    Social History Social History   Tobacco Use  .  Smoking status: Current Every Day Smoker    Packs/day: 0.50    Years: 36.00    Pack years: 18.00    Types: Cigarettes  . Smokeless tobacco: Never Used  Substance Use Topics  . Alcohol use: Yes    Alcohol/week: 2.4 oz    Types: 4 Glasses of wine per week  . Drug use: Yes    Types: Marijuana    Comment: "last marijuana ~ 06/2014     Allergies   Statins; Ace inhibitors; Sertraline hcl; Ciprofloxacin hcl; and Tramadol   Review of Systems Review of Systems  Constitutional: Positive for fatigue.  Cardiovascular: Positive for chest pain.  Neurological: Positive for syncope.  Psychiatric/Behavioral: The patient is nervous/anxious.   All other systems reviewed and are negative.    Physical Exam Updated Vital Signs BP (!) 139/95   Pulse 66   Temp 97.8 F (36.6 C) (Oral)   Resp (!) 22   Ht 5\' 6"  (1.676 m)   Wt 80.7 kg (178 lb)   SpO2 98%   BMI 28.73 kg/m   Physical Exam  Constitutional: She is oriented to person, place, and time. She appears well-developed and well-nourished. She appears distressed (Anxious, tearful).  HENT:  Head: Normocephalic and atraumatic.  Eyes: Conjunctivae are normal.  Neck: Neck supple.  Cardiovascular: Normal rate, regular rhythm and normal heart sounds. Exam reveals no friction rub.  No murmur heard. Pulmonary/Chest: Effort normal and breath sounds normal. No respiratory distress. She has no wheezes. She has no rales.  Abdominal: She exhibits no distension.  Musculoskeletal: She exhibits no edema.  Neurological: She is alert and oriented to person, place, and time. She exhibits normal muscle tone.  Skin: Skin is warm. Capillary refill takes less than 2 seconds.  Psychiatric: Her mood appears anxious.  Nursing note and vitals reviewed.    ED Treatments /  Results  Labs (all labs ordered are listed, but only abnormal results are displayed) Labs Reviewed  BASIC METABOLIC PANEL - Abnormal; Notable for the following components:      Result Value   CO2 20 (*)    All other components within normal limits  TROPONIN I - Abnormal; Notable for the following components:   Troponin I 0.10 (*)    All other components within normal limits  TROPONIN I - Abnormal; Notable for the following components:   Troponin I 1.32 (*)    All other components within normal limits  I-STAT TROPONIN, ED - Abnormal; Notable for the following components:   Troponin i, poc 0.09 (*)    All other components within normal limits  CBC  PROTIME-INR  D-DIMER, QUANTITATIVE (NOT AT [facility])    EKG EKG Interpretation  Date/Time:  Sunday December 23 2017 16:30:19 EDT Ventricular Rate:  60 PR Interval:    QRS Duration: 99 QT Interval:  424 QTC Calculation: 424 R Axis:   23 Text Interpretation:  Sinus rhythm No significant change since last tracing Confirmed by Duffy Bruce [phone]) on 12/23/2017 4:44:25 PM   Radiology Dg Chest 2 View  Result Date: 12/23/2017 CLINICAL DATA:  Chest pain and neck tightness. Nonproductive cough for several weeks. EXAM: CHEST - 2 VIEW COMPARISON:  04/29/2017 FINDINGS: The heart size and mediastinal contours are within normal limits. Both lungs are clear. The visualized skeletal structures are unremarkable. IMPRESSION: Negative.  No active cardiopulmonary disease. Electronically Signed   By: Earle Gell M.D.   On: 12/23/2017 15:39    Procedures .Critical Care Performed by: Duffy Bruce, MD Authorized by:  Duffy Bruce, MD   Critical care provider statement:    Critical care time (minutes):  45   Critical care time was exclusive of:  Separately billable procedures and treating other patients and teaching time   Critical care was necessary to treat or prevent imminent or life-threatening deterioration of the following conditions:   Circulatory failure and cardiac failure   Critical care was time spent personally by me on the following activities:  Development of treatment plan with patient or surrogate, discussions with consultants, evaluation of patient's response to treatment, examination of patient, obtaining history from patient or surrogate, ordering and performing treatments and interventions, ordering and review of laboratory studies, ordering and review of radiographic studies, pulse oximetry, re-evaluation of patient's condition and review of old charts   I assumed direction of critical care for this patient from another provider in my specialty: no     (including critical care time)  Medications Ordered in ED Medications  nitroGLYCERIN 50 mg in dextrose 5 % 250 mL (0.2 mg/mL) infusion (has no administration in time range)  ALPRAZolam (XANAX) tablet 1 mg (1 mg Oral Given 12/23/17 1538)  aspirin chewable tablet 324 mg (324 mg Oral Given 12/23/17 1538)  morphine 4 MG/ML injection 4 mg (4 mg Intravenous Given 12/23/17 1645)  0.9 %  sodium chloride infusion ( Intravenous New Bag/Given 12/23/17 1645)  nitroGLYCERIN (NITROGLYN) 2 % ointment 1 inch (1 inch Topical Given 12/23/17 1821)     Initial Impression / Assessment and Plan / ED Course  I have reviewed the triage vital signs and the nursing notes.  Pertinent labs & imaging results that were available during my care of the patient were reviewed by me and considered in my medical decision making (see chart for details).     62 year old female here with chest pain in the setting of significantly increased anxiety.  She is notably anxious here.  EKG nonischemic.  However, she does have an extensive family history of heart disease, tobacco abuse, and multiple risk factors for CAD and she may have a component of ACS secondary to her stressors.  Troponin is 0.09.  Patient was given Xanax, aspirin, with improvement.  Chest x-ray is clear.  Will admit for possible N STEMI.   Cardiology consulted.  Discussed with Cardiology Dr. Trey Paula - he recommends ASA, hold for repeat trop - if positive, admit to [facility]. If neg, admit to [facility].  Pain improving on nitro. Repeat trop 1.32 - concern for NSTEMI, though stress-induced CM (Takutsubo's) would certainly fit as well. Repeat EKG w/o STEMI. Pt on heparin, nitro, and will admit to [facility]. Dr. Raiford Simmonds of Cards aware, accepts to Stepdown at [facility]  Final Clinical Impressions(s) / ED Diagnoses   Final diagnoses:  NSTEMI (non-ST elevated myocardial infarction) [facility])    ED Discharge Orders    None       Duffy Bruce, MD 12/23/17 Pollie Meyer    Duffy Bruce, MD 12/23/17 [phone]

## 2017-12-23 NOTE — ED Notes (Signed)
Bed: EY22 Expected date:  Expected time:  Means of arrival:  Comments: EMS/anxiety/?near-syncope

## 2017-12-23 NOTE — Progress Notes (Signed)
ANTICOAGULATION CONSULT NOTE - Initial Consult  Pharmacy Consult for Heparin Indication: chest pain/ACS  Allergies  Allergen Reactions  . Statins Swelling and Other (See Comments)    Stomach swelling & muscle ache  . Ace Inhibitors Other (See Comments)    Other reaction(s): Palpitations  . Sertraline Hcl Diarrhea  . Ciprofloxacin Hcl Palpitations  . Tramadol Other (See Comments) and Anxiety    Extreme muscle pain Extreme agitation Extreme muscle pain Extreme agitation    Patient Measurements: Height: 5\' 6"  (167.6 cm) Weight: 178 lb (80.7 kg) IBW/kg (Calculated) : 59.3 Heparin Dosing Weight: 78kg  Vital Signs: Temp: 97.8 F (36.6 C) (04/14 1456) Temp Source: Oral (04/14 1456) BP: 151/94 (04/14 1855) Pulse Rate: 71 (04/14 1855)  Labs: Recent Labs    12/23/17 1509 12/23/17 1800  HGB 13.4  --   HCT 39.6  --   PLT 232  --   LABPROT 12.1  --   INR 0.91  --   CREATININE 0.69  --   TROPONINI 0.10* 1.32*    Estimated Creatinine Clearance: 79.2 mL/min (by C-G formula based on SCr of 0.69 mg/dL).   Medical History: Past Medical History:  Diagnosis Date  . Adjustment disorder with mixed anxiety and depressed mood 01/05/2013  . Allergy   . Anxiety   . Arthritis    "knees,wrists" (07/31/2014)  . Brain aneurysm 2012   2cms.  . Breast cancer ([place]) 06/05/2013  . Breast disorder    lump in left breast  . Carcinoid tumor   . Complication of anesthesia    hallucinations , anxiety   . Depression   . H/O echocardiogram    not finding report in EPIC, pt. reports that it was before her appt. /w Dr. Johnsie Cancel   . Hepatitis B dx'd ~ 1980  . Hepatitis C dx'd ~ 2000   "specialist says it cleared itself on it's own"  . Hx of radiation therapy 07/17/13-09/03/13   left breast,60.4Gy, 32/33 fx completed  . Hyperlipidemia   . Hypertension    "never took my RX" (07/31/2014)  . Jaundice   . Malignant neoplasm of lower-outer quadrant of female breast ([place]) 05/19/2013  . Pain  management    goes to [facility] clinic, but she doesn't intend to return   . Personal history of radiation therapy    2014  . Stroke [facility]) 2012   TIA  . TIA (transient ischemic attack) 06/2011   "can't see real well out of left eye since; weaker on left side since"  . Varicose veins   . Walking pneumonia ~ 2008    Medications:  Infusions:  . nitroGLYCERIN    ASA 81mg  daily PTA, no other anticoagulants  Assessment: 62 yo F presents with chest pain, elevated troponin.  Pharmacy consulted to start IV heparin for NSTEMI. Baseline CBC WNL.  No bleeding noted.   Goal of Therapy:  Heparin level 0.3-0.7 units/ml Monitor platelets by anticoagulation protocol: Yes   Plan:  Give 4000 units bolus x 1 Start heparin infusion at 1000 units/hr Check anti-Xa level in 6 hours and daily while on heparin Continue to monitor H&H and platelets  Biagio Borg 12/23/2017,6:57 PM

## 2017-12-23 NOTE — ED Notes (Signed)
Pt will be going to [facility] unit for care.

## 2017-12-23 NOTE — ED Notes (Signed)
Called lab regarding D-Dimmer. They will check to see if they can still run blood.
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
## 2017-12-23 NOTE — ED Triage Notes (Signed)
Pt did receive 324 of asprin in route to [place].

## 2017-12-24 ENCOUNTER — Encounter (HOSPITAL_COMMUNITY): Payer: Self-pay | Admitting: Cardiology

## 2017-12-24 ENCOUNTER — Inpatient Hospital Stay (HOSPITAL_COMMUNITY)
Admission: EM | Disposition: A | Discharge status: Home / Self Care | Payer: Self-pay | Source: Home / Self Care | Attending: Cardiovascular Disease

## 2017-12-24 ENCOUNTER — Inpatient Hospital Stay (HOSPITAL_COMMUNITY): Payer: Medicaid Other

## 2017-12-24 DIAGNOSIS — I639 Cerebral infarction, unspecified: Secondary | ICD-10-CM

## 2017-12-24 DIAGNOSIS — R7989 Other specified abnormal findings of blood chemistry: Secondary | ICD-10-CM

## 2017-12-24 DIAGNOSIS — Z720 Tobacco use: Secondary | ICD-10-CM

## 2017-12-24 DIAGNOSIS — R9430 Abnormal result of cardiovascular function study, unspecified: Secondary | ICD-10-CM | POA: Diagnosis present

## 2017-12-24 DIAGNOSIS — I5030 Unspecified diastolic (congestive) heart failure: Secondary | ICD-10-CM

## 2017-12-24 DIAGNOSIS — R748 Abnormal levels of other serum enzymes: Secondary | ICD-10-CM

## 2017-12-24 DIAGNOSIS — R931 Abnormal findings on diagnostic imaging of heart and coronary circulation: Secondary | ICD-10-CM | POA: Diagnosis present

## 2017-12-24 DIAGNOSIS — R778 Other specified abnormalities of plasma proteins: Secondary | ICD-10-CM

## 2017-12-24 DIAGNOSIS — I214 Non-ST elevation (NSTEMI) myocardial infarction: Secondary | ICD-10-CM

## 2017-12-24 DIAGNOSIS — E7800 Pure hypercholesterolemia, unspecified: Secondary | ICD-10-CM

## 2017-12-24 HISTORY — PX: CARDIAC CATHETERIZATION: SHX172

## 2017-12-24 HISTORY — PX: LEFT HEART CATH AND CORONARY ANGIOGRAPHY: CATH118249

## 2017-12-24 LAB — HEPATIC FUNCTION PANEL
ALK PHOS: 55 U/L (ref 38–126)
ALT: 21 U/L (ref 14–54)
AST: 28 U/L (ref 15–41)
Albumin: 3.5 g/dL (ref 3.5–5.0)
BILIRUBIN INDIRECT: 0.9 mg/dL (ref 0.3–0.9)
BILIRUBIN TOTAL: 1.1 mg/dL (ref 0.3–1.2)
Bilirubin, Direct: 0.2 mg/dL (ref 0.1–0.5)
Total Protein: 6.5 g/dL (ref 6.5–8.1)

## 2017-12-24 LAB — T4, FREE: Free T4: 0.87 ng/dL (ref 0.61–1.12)

## 2017-12-24 LAB — CBC
HEMATOCRIT: 34.8 % — AB (ref 36.0–46.0)
HEMOGLOBIN: 11.4 g/dL — AB (ref 12.0–15.0)
MCH: 29.2 pg (ref 26.0–34.0)
MCHC: 32.8 g/dL (ref 30.0–36.0)
MCV: 89 fL (ref 78.0–100.0)
Platelets: 183 10*3/uL (ref 150–400)
RBC: 3.91 MIL/uL (ref 3.87–5.11)
RDW: 13.5 % (ref 11.5–15.5)
WBC: 7.6 10*3/uL (ref 4.0–10.5)

## 2017-12-24 LAB — LIPID PANEL
CHOLESTEROL: 200 mg/dL (ref 0–200)
HDL: 76 mg/dL (ref >40)
LDL CALC: 98 mg/dL (ref 0–99)
TRIGLYCERIDES: 131 mg/dL (ref <150)
Total CHOL/HDL Ratio: 2.6 RATIO
VLDL: 26 mg/dL (ref 0–40)

## 2017-12-24 LAB — HEPARIN LEVEL (UNFRACTIONATED)
HEPARIN UNFRACTIONATED: 0.21 [IU]/mL — AB (ref 0.30–0.70)
Heparin Unfractionated: 0.39 IU/mL (ref 0.30–0.70)

## 2017-12-24 LAB — TROPONIN I
TROPONIN I: 1.79 ng/mL — AB (ref <0.03)
Troponin I: 1.74 ng/mL (ref <0.03)

## 2017-12-24 LAB — TSH: TSH: 1.333 u[IU]/mL (ref 0.350–4.500)

## 2017-12-24 LAB — MAGNESIUM: MAGNESIUM: 1.9 mg/dL (ref 1.7–2.4)

## 2017-12-24 LAB — HEMOGLOBIN A1C
Hgb A1c MFr Bld: 5.2 % (ref 4.8–5.6)
Mean Plasma Glucose: 102.54 mg/dL

## 2017-12-24 SURGERY — LEFT HEART CATH AND CORONARY ANGIOGRAPHY
Anesthesia: LOCAL

## 2017-12-24 MED ORDER — FENTANYL CITRATE (PF) 100 MCG/2ML IJ SOLN
INTRAMUSCULAR | Status: AC
  Filled 2017-12-24: qty 2

## 2017-12-24 MED ORDER — ONDANSETRON HCL 4 MG/2ML IJ SOLN
4.0000 mg | Freq: Once | INTRAMUSCULAR | Status: AC
  Administered 2017-12-24: 4 mg via INTRAVENOUS
  Filled 2017-12-24: qty 2

## 2017-12-24 MED ORDER — ONDANSETRON HCL 4 MG/2ML IJ SOLN: 4.0000 mg | Freq: Four times a day (QID) | INTRAMUSCULAR | Status: DC | PRN

## 2017-12-24 MED ORDER — ASPIRIN EC 81 MG PO TBEC
81.0000 mg | DELAYED_RELEASE_TABLET | Freq: Every day | ORAL | Status: DC
  Administered 2017-12-24 – 2017-12-25 (×2): 81 mg via ORAL
  Filled 2017-12-24 (×2): qty 1

## 2017-12-24 MED ORDER — METOPROLOL TARTRATE 12.5 MG HALF TABLET
12.5000 mg | ORAL_TABLET | Freq: Two times a day (BID) | ORAL | Status: DC
  Administered 2017-12-24 (×2): 12.5 mg via ORAL
  Filled 2017-12-24 (×2): qty 1

## 2017-12-24 MED ORDER — VERAPAMIL HCL 2.5 MG/ML IV SOLN
INTRAVENOUS | Status: AC
  Filled 2017-12-24: qty 2

## 2017-12-24 MED ORDER — IOHEXOL 350 MG/ML SOLN
INTRAVENOUS | Status: DC | PRN
  Administered 2017-12-24: 30 mL via INTRA_ARTERIAL

## 2017-12-24 MED ORDER — HEPARIN (PORCINE) IN NACL 2-0.9 UNIT/ML-% IJ SOLN
INTRAMUSCULAR | Status: DC | PRN
  Administered 2017-12-24: 14:00:00 via INTRA_ARTERIAL

## 2017-12-24 MED ORDER — ZOLPIDEM TARTRATE 5 MG PO TABS: 5.0000 mg | ORAL_TABLET | Freq: Every evening | ORAL | Status: DC | PRN

## 2017-12-24 MED ORDER — ASPIRIN 81 MG PO CHEW: 81.0000 mg | CHEWABLE_TABLET | ORAL | Status: DC

## 2017-12-24 MED ORDER — LIDOCAINE HCL (PF) 1 % IJ SOLN
INTRAMUSCULAR | Status: AC
  Filled 2017-12-24: qty 30

## 2017-12-24 MED ORDER — NITROGLYCERIN 0.4 MG SL SUBL: 0.4000 mg | SUBLINGUAL_TABLET | SUBLINGUAL | Status: DC | PRN

## 2017-12-24 MED ORDER — ANGIOPLASTY BOOK
Freq: Once | Status: AC
  Administered 2017-12-24: 22:00:00
  Filled 2017-12-24: qty 1

## 2017-12-24 MED ORDER — SODIUM CHLORIDE 0.9% FLUSH: 3.0000 mL | INTRAVENOUS | Status: DC | PRN

## 2017-12-24 MED ORDER — HEART ATTACK BOUNCING BOOK
Freq: Once | Status: AC
  Administered 2017-12-24: 22:00:00
  Filled 2017-12-24: qty 1

## 2017-12-24 MED ORDER — HEPARIN (PORCINE) IN NACL 2-0.9 UNIT/ML-% IJ SOLN
INTRAMUSCULAR | Status: AC
  Filled 2017-12-24: qty 1000

## 2017-12-24 MED ORDER — ACETAMINOPHEN 325 MG PO TABS: 650.0000 mg | ORAL_TABLET | ORAL | Status: DC | PRN

## 2017-12-24 MED ORDER — NITROGLYCERIN IN D5W 200-5 MCG/ML-% IV SOLN: 0.0000 ug/min | INTRAVENOUS | Status: DC

## 2017-12-24 MED ORDER — SODIUM CHLORIDE 0.9% FLUSH: 3.0000 mL | Freq: Two times a day (BID) | INTRAVENOUS | Status: DC

## 2017-12-24 MED ORDER — MIDAZOLAM HCL 2 MG/2ML IJ SOLN
INTRAMUSCULAR | Status: DC | PRN
  Administered 2017-12-24: 2 mg via INTRAVENOUS

## 2017-12-24 MED ORDER — HEPARIN SODIUM (PORCINE) 1000 UNIT/ML IJ SOLN
INTRAMUSCULAR | Status: AC
  Filled 2017-12-24: qty 1

## 2017-12-24 MED ORDER — ALPRAZOLAM 0.5 MG PO TABS
1.0000 mg | ORAL_TABLET | Freq: Two times a day (BID) | ORAL | Status: DC | PRN
  Administered 2017-12-24 – 2017-12-25 (×3): 1 mg via ORAL
  Filled 2017-12-24 (×3): qty 2

## 2017-12-24 MED ORDER — SODIUM CHLORIDE 0.9 % IV SOLN: 250.0000 mL | INTRAVENOUS | Status: DC | PRN

## 2017-12-24 MED ORDER — MIDAZOLAM HCL 2 MG/2ML IJ SOLN
INTRAMUSCULAR | Status: AC
  Filled 2017-12-24: qty 2

## 2017-12-24 MED ORDER — LIDOCAINE HCL (PF) 1 % IJ SOLN
INTRAMUSCULAR | Status: DC | PRN
  Administered 2017-12-24: 2 mL

## 2017-12-24 MED ORDER — SODIUM CHLORIDE 0.9 % IV SOLN: INTRAVENOUS | Status: DC

## 2017-12-24 MED ORDER — HEPARIN (PORCINE) IN NACL 2-0.9 UNIT/ML-% IJ SOLN
INTRAMUSCULAR | Status: AC | PRN
  Administered 2017-12-24 (×2): 500 mL via INTRA_ARTERIAL

## 2017-12-24 MED ORDER — HEPARIN SODIUM (PORCINE) 1000 UNIT/ML IJ SOLN
INTRAMUSCULAR | Status: DC | PRN
  Administered 2017-12-24: 4000 [IU] via INTRAVENOUS

## 2017-12-24 MED ORDER — FENTANYL CITRATE (PF) 100 MCG/2ML IJ SOLN
INTRAMUSCULAR | Status: DC | PRN
  Administered 2017-12-24: 50 ug via INTRAVENOUS

## 2017-12-24 MED ORDER — SODIUM CHLORIDE 0.9% FLUSH
3.0000 mL | Freq: Two times a day (BID) | INTRAVENOUS | Status: DC
  Administered 2017-12-24: 3 mL via INTRAVENOUS

## 2017-12-24 SURGICAL SUPPLY — 15 items
BAND CMPR LRG ZPHR (HEMOSTASIS) ×1
BAND ZEPHYR COMPRESS 30 LONG (HEMOSTASIS) ×1 IMPLANT
CATH IMPULSE 5F ANG/FL3.5 (CATHETERS) ×1 IMPLANT
COVER PRB 48X5XTLSCP FOLD TPE (BAG) IMPLANT
COVER PROBE 5X48 (BAG) ×2
GUIDEWIRE INQWIRE 1.5J.035X260 (WIRE) IMPLANT
INQWIRE 1.5J .035X260CM (WIRE) ×2
KIT HEART LEFT (KITS) ×2 IMPLANT
NDL PERC 21GX4CM (NEEDLE) IMPLANT
NEEDLE PERC 21GX4CM (NEEDLE) ×2 IMPLANT
PACK CARDIAC CATHETERIZATION (CUSTOM PROCEDURE TRAY) ×2 IMPLANT
SHEATH RAIN RADIAL 21G 6FR (SHEATH) ×1 IMPLANT
TRANSDUCER W/STOPCOCK (MISCELLANEOUS) ×2 IMPLANT
TUBING CIL FLEX 10 FLL-RA (TUBING) ×2 IMPLANT
WIRE HI TORQ VERSACORE-J 145CM (WIRE) ×1 IMPLANT
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
## 2017-12-24 NOTE — ED Notes (Signed)
Hospitalist at bedside 

## 2017-12-24 NOTE — Progress Notes (Signed)
[facility] BAND REMOVAL  LOCATION:    right radial  DEFLATED PER PROTOCOL:    Yes.    TIME BAND OFF / DRESSING APPLIED:    1830   SITE UPON ARRIVAL:    Level 0  SITE AFTER BAND REMOVAL:    Level 0  CIRCULATION SENSATION AND MOVEMENT:    Within Normal Limits   Yes.    COMMENTS:   Tolerated procedure well, good cap refill

## 2017-12-24 NOTE — Interval H&P Note (Signed)
Cath Lab Visit (complete for each Cath Lab visit)  Clinical Evaluation Leading to the Procedure:   ACS: Yes.    Non-ACS:    Anginal Classification: CCS IV  Anti-ischemic medical therapy: Minimal Therapy (1 class of medications)  Non-Invasive Test Results: No non-invasive testing performed  Prior CABG: No previous CABG      History and Physical Interval Note:  12/24/2017 2:09 PM  Anallely B Muzio  has presented today for surgery, with the diagnosis of angina  The various methods of treatment have been discussed with the patient and family. After consideration of risks, benefits and other options for treatment, the patient has consented to  Procedure(s): LEFT HEART CATH AND CORONARY ANGIOGRAPHY (N/A) as a surgical intervention .  The patient's history has been reviewed, patient examined, no change in status, stable for surgery.  I have reviewed the patient's chart and labs.  Questions were answered to the patient's satisfaction.     Larae Grooms

## 2017-12-24 NOTE — ED Notes (Signed)
Nitro patch removed at this time by Dr. Oval Linsey. Report provided for carelink, carelink in route to transport patient.

## 2017-12-24 NOTE — Progress Notes (Signed)
ANTICOAGULATION CONSULT NOTE - Follow Up Consult  Pharmacy Consult for Heparin Indication: chest pain/ACS  Allergies  Allergen Reactions  . Statins Swelling and Other (See Comments)    Stomach swelling & muscle ache  . Ace Inhibitors Other (See Comments)    Other reaction(s): Palpitations  . Sertraline Hcl Diarrhea  . Ciprofloxacin Hcl Palpitations  . Tramadol Other (See Comments) and Anxiety    Extreme muscle pain Extreme agitation Extreme muscle pain Extreme agitation    Patient Measurements: Height: 5\' 6"  (167.6 cm) Weight: 178 lb (80.7 kg) IBW/kg (Calculated) : 59.3 Heparin Dosing Weight:   Vital Signs: BP: 108/69 (04/15 0530) Pulse Rate: 65 (04/15 0530)  Labs: Recent Labs    12/23/17 1509 12/23/17 1800 12/23/17 2158 12/24/17 0428  HGB 13.4  --   --  11.4*  HCT 39.6  --   --  34.8*  PLT 232  --   --  183  LABPROT 12.1  --   --   --   INR 0.91  --   --   --   HEPARINUNFRC  --   --   --  0.21*  CREATININE 0.69  --   --   --   TROPONINI 0.10* 1.32* 1.76*  --     Estimated Creatinine Clearance: 79.2 mL/min (by C-G formula based on SCr of 0.69 mg/dL).   Medications:  Infusions:  . heparin 1,000 Units/hr (12/23/17 1939)    Assessment: Patient with low heparin level.  No heparin issues per RN. Patient with planned transfer to [facility] for procedure.  Goal of Therapy:  Heparin level 0.3-0.7 units/ml Monitor platelets by anticoagulation protocol: Yes   Plan:  Increase heparin to 1200 units/hr Recheck level at 1300 F/u transfer to [place], [place] 12/24/2017,5:48 AM

## 2017-12-24 NOTE — Progress Notes (Signed)
  Echocardiogram 2D Echocardiogram has been performed.  Darlina Sicilian M 12/24/2017, 8:37 AM

## 2017-12-24 NOTE — Progress Notes (Signed)
  Echocardiogram 2D Echocardiogram has been performed.  Darlina Sicilian M 12/24/2017, 8:36 AM

## 2017-12-25 ENCOUNTER — Telehealth: Payer: Self-pay | Admitting: Cardiology

## 2017-12-25 ENCOUNTER — Encounter (HOSPITAL_COMMUNITY): Payer: Self-pay | Admitting: Interventional Cardiology

## 2017-12-25 DIAGNOSIS — I5181 Takotsubo syndrome: Principal | ICD-10-CM

## 2017-12-25 LAB — CBC
HEMATOCRIT: 35.3 % — AB (ref 36.0–46.0)
HEMOGLOBIN: 11.5 g/dL — AB (ref 12.0–15.0)
MCH: 29.3 pg (ref 26.0–34.0)
MCHC: 32.6 g/dL (ref 30.0–36.0)
MCV: 90.1 fL (ref 78.0–100.0)
Platelets: 202 10*3/uL (ref 150–400)
RBC: 3.92 MIL/uL (ref 3.87–5.11)
RDW: 14 % (ref 11.5–15.5)
WBC: 7.1 10*3/uL (ref 4.0–10.5)

## 2017-12-25 LAB — BASIC METABOLIC PANEL
ANION GAP: 8 (ref 5–15)
BUN: 9 mg/dL (ref 6–20)
CO2: 24 mmol/L (ref 22–32)
Calcium: 8.6 mg/dL — ABNORMAL LOW (ref 8.9–10.3)
Chloride: 108 mmol/L (ref 101–111)
Creatinine, Ser: 0.72 mg/dL (ref 0.44–1.00)
GFR calc Af Amer: >60 mL/min (ref >60)
GLUCOSE: 106 mg/dL — AB (ref 65–99)
POTASSIUM: 4.1 mmol/L (ref 3.5–5.1)
Sodium: 140 mmol/L (ref 135–145)

## 2017-12-25 LAB — TROPONIN I
TROPONIN I: 1.17 ng/mL — AB (ref <0.03)
Troponin I: 1.73 ng/mL (ref <0.03)

## 2017-12-25 LAB — HIV ANTIBODY (ROUTINE TESTING W REFLEX): HIV Screen 4th Generation wRfx: NONREACTIVE

## 2017-12-25 MED ORDER — METOPROLOL SUCCINATE ER 25 MG PO TB24
12.5000 mg | ORAL_TABLET | Freq: Every day | ORAL | Status: DC
  Administered 2017-12-25: 12:00:00 12.5 mg via ORAL
  Filled 2017-12-25: qty 1

## 2017-12-25 MED ORDER — NITROGLYCERIN 0.4 MG SL SUBL: 0.4000 mg | SUBLINGUAL_TABLET | SUBLINGUAL | 1 refills | Status: DC | PRN

## 2017-12-25 MED ORDER — METOPROLOL SUCCINATE ER 25 MG PO TB24: 12.5000 mg | ORAL_TABLET | Freq: Every day | ORAL | 3 refills | Status: DC

## 2017-12-25 MED FILL — Heparin Sodium (Porcine) 2 Unit/ML in Sodium Chloride 0.9%: INTRAMUSCULAR | Qty: 1000 | Status: AC

## 2017-12-25 NOTE — Progress Notes (Signed)
Progress Note  Patient Name: [name] Date of Encounter: 12/25/2017  Primary Cardiologist: Skeet Latch, MD   Subjective   Feeling well.  No chest pain.  Mild shortness of breath with ambulation.   Inpatient Medications    Scheduled Meds: . aspirin EC  81 mg Oral Daily  . metoprolol tartrate  12.5 mg Oral BID  . sodium chloride flush  3 mL Intravenous Q12H   Continuous Infusions: . sodium chloride    . nitroGLYCERIN     PRN Meds: sodium chloride, acetaminophen, ALPRAZolam, morphine injection, nitroGLYCERIN, ondansetron (ZOFRAN) IV, sodium chloride flush, zolpidem   Vital Signs    Vitals:   12/24/17 2006 12/24/17 2156 12/25/17 0410 12/25/17 0755  BP: (!) 96/54 101/60 122/62 (!) 89/52  Pulse: 72 71 60 62  Resp: (!) 22  13 14   Temp: 98.3 F (36.8 C)  (!) 97.5 F (36.4 C) 98.8 F (37.1 C)  TempSrc: Oral  Oral Oral  SpO2: 97%  98% 98%  Weight:   175 lb 11.3 oz (79.7 kg)   Height:        Intake/Output Summary (Last 24 hours) at 12/25/2017 0958 Last data filed at 12/25/2017 0700 Gross per 24 hour  Intake 940 ml  Output 800 ml  Net 140 ml   Filed Weights   12/23/17 1454 12/23/17 1456 12/25/17 0410  Weight: 165 lb (74.8 kg) 178 lb (80.7 kg) 175 lb 11.3 oz (79.7 kg)    Telemetry    Sinus rhythm.  No events - Personally Reviewed  ECG    Sinus bradycardia.  Rate 59 bpm.  Inferior TWI.  - Personally Reviewed  Physical Exam   VS:  BP (!) 89/52 (BP Location: Left Arm)   Pulse 62   Temp 98.8 F (37.1 C) (Oral)   Resp 14   Ht 5\' 6"  (1.676 m)   Wt 175 lb 11.3 oz (79.7 kg)   SpO2 98%   BMI 28.36 kg/m  , BMI Body mass index is 28.36 kg/m. GENERAL:  Well appearing.  Anxious. HEENT: Pupils equal round and reactive, fundi not visualized, oral mucosa unremarkable NECK:  No jugular venous distention, waveform within normal limits, carotid upstroke brisk and symmetric, no bruits LUNGS:  Clear to auscultation bilaterally HEART:  RRR.  PMI not displaced  or sustained,S1 and S2 within normal limits, no S3, no S4, no clicks, no rubs, no murmurs ABD:  Flat, positive bowel sounds normal in frequency in pitch, no bruits, no rebound, no guarding, no midline pulsatile mass, no hepatomegaly, no splenomegaly EXT:  2 plus pulses throughout, no edema, no cyanosis no clubbing SKIN:  No rashes no nodules NEURO:  Cranial nerves II through XII grossly intact, motor grossly intact throughout [facility]:  Cognitively intact, oriented to person place and time   Labs    Chemistry Recent Labs  Lab 12/23/17 1509 12/24/17 0953 12/25/17 0654  NA 140  --  140  K 3.6  --  4.1  CL 110  --  108  CO2 20*  --  24  GLUCOSE 94  --  106*  BUN 15  --  9  CREATININE 0.69  --  0.72  CALCIUM 9.2  --  8.6*  PROT  --  6.5  --   ALBUMIN  --  3.5  --   AST  --  28  --   ALT  --  21  --   ALKPHOS  --  55  --   BILITOT  --  1.1  --   GFRNONAA >60  --  >60  GFRAA >60  --  >60  ANIONGAP 10  --  8     Hematology Recent Labs  Lab 12/23/17 1509 12/24/17 0428 12/25/17 0654  WBC 7.8 7.6 7.1  RBC 4.50 3.91 3.92  HGB 13.4 11.4* 11.5*  HCT 39.6 34.8* 35.3*  MCV 88.0 89.0 90.1  MCH 29.8 29.2 29.3  MCHC 33.8 32.8 32.6  RDW 13.5 13.5 14.0  PLT 232 183 202    Cardiac Enzymes Recent Labs  Lab 12/24/17 0953 12/24/17 1854 12/25/17 0035 12/25/17 0654  TROPONINI 1.74* 1.79* 1.73* 1.17*    Recent Labs  Lab 12/23/17 1514  TROPIPOC 0.09*     BNPNo results for input(s): BNP, PROBNP in the last 168 hours.   DDimer  Recent Labs  Lab 12/23/17 1509  DDIMER 0.35     Radiology    Dg Chest 2 View  Result Date: 12/23/2017 CLINICAL DATA:  Chest pain and neck tightness. Nonproductive cough for several weeks. EXAM: CHEST - 2 VIEW COMPARISON:  04/29/2017 FINDINGS: The heart size and mediastinal contours are within normal limits. Both lungs are clear. The visualized skeletal structures are unremarkable. IMPRESSION: Negative.  No active cardiopulmonary disease.  Electronically Signed   By: Earle Gell M.D.   On: 12/23/2017 15:39    Cardiac Studies   Echo 12/24/17: Study Conclusions  - Left ventricle: Longitudinal global LV strain is borderlin   abnormal at -15% with the abnormality primarly in the   inferolateral wall. The cavity size was normal. There was mild   focal basal hypertrophy of the septum. Systolic function was   normal. The estimated ejection fraction was in the range of 50%   to 55%. There is hypokinesis of the mid-apicalinferolateral and   inferior myocardium. There was an increased relative contribution   of atrial contraction to ventricular filling. Doppler parameters   are consistent with abnormal left ventricular relaxation (grade 1   diastolic dysfunction). - Aortic valve: Mildly to moderately calcified annulus. Trileaflet;   normal thickness leaflets. There was trivial regurgitation. - Mitral valve: Calcified annulus. - Pulmonic valve: There was trivial regurgitation.  Impressions:  - Compared to prior echo, inferior and inferolateral wall motion   abnormalities are new.  LHC 12/24/17:  Mid RCA lesion is 10% stenosed.  LV end diastolic pressure is mildly elevated.  There is moderate to severe left ventricular systolic dysfunction in the pattern of Takotsubo cardiomyopathy  The left ventricular ejection fraction is 25-35% by visual estimate.  There is no aortic valve stenosis.   Non-obstructive CAD.   Takotsubo cardiomyopathy.  Plan for medical therapy.  Reassess LV function in 6 weeks.   Patient Profile     62 y.o. 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   Assessment & Plan    # Takatosubo cardiomyopathy:  # NSTEMI: Cath revealed only 10% RCA disease.  LVEF 25-35% by LVgram.  She has an allergy to ACE-I.  Will continue metoprolol and switch to metoprolol succinate  12.5mg  daily.  Plan to repeat echo in 6 weeks. Limit to 2g sodium, 2L fluids daily. She has been very stressed and anxious which likely contributed to this event.  She ran out of Xanax prior to admission.  She will work with her PCP on alternative agents for managing her anxiety.  She has a statin allergy.  LDL  98 this admission.  Need to repeat out of the acute care setting.   # Tobacco abuse: She was encouraged to continue abstaining from cigarettes.    For questions or updates, please contact [facility] Please consult [email or web address] for contact info under Cardiology/STEMI.      Signed, Skeet Latch, MD  12/25/2017, 9:58 AM

## 2017-12-25 NOTE — Telephone Encounter (Signed)
Patient discharged today First TOC should be 12/26/17

## 2017-12-25 NOTE — Discharge Summary (Signed)
Discharge Summary    Patient ID: CHANDELL ATTRIDGE,  MRN: [id number], DOB/AGE: 03/18/1956 62 y.o.  Admit date: 12/23/2017 Discharge date: 12/25/2017  Primary Care Provider: Chesley Noon Primary Cardiologist: Skeet Latch, MD  Discharge Diagnoses    Principal Problem:   NSTEMI (non-ST elevated myocardial infarction) [facility]) Active Problems:   Hyperlipidemia   Tobacco abuse   Wall motion abnormality of inferior wall of left ventricle   Elevated troponin   Takotsubo cardiomyopathy   Allergies Allergies  Allergen Reactions  . Statins Swelling and Other (See Comments)    Stomach swelling & muscle ache  . Ace Inhibitors Other (See Comments)    Other reaction(s): Palpitations  . Sertraline Hcl Diarrhea  . Ciprofloxacin Hcl Palpitations  . Tramadol Other (See Comments) and Anxiety    Extreme muscle pain Extreme agitation Extreme muscle pain Extreme agitation    Diagnostic Studies/Procedures    Echo 12/24/17: Study Conclusions - Left ventricle: Longitudinal global LV strain is borderlin abnormal at -15% with the abnormality primarly in the inferolateral wall. The cavity size was normal. There was mild focal basal hypertrophy of the septum. Systolic function was normal. The estimated ejection fraction was in the range of 50% to 55%. There is hypokinesis of the mid-apicalinferolateral and inferior myocardium. There was an increased relative contribution of atrial contraction to ventricular filling. Doppler parameters are consistent with abnormal left ventricular relaxation (grade 1 diastolic dysfunction). - Aortic valve: Mildly to moderately calcified annulus. Trileaflet; normal thickness leaflets. There was trivial regurgitation. - Mitral valve: Calcified annulus. - Pulmonic valve: There was trivial regurgitation.  Impressions: - Compared to prior echo, inferior and inferolateral wall motion abnormalities are new.   LHC  12/24/17:  Mid RCA lesion is 10% stenosed.  LV end diastolic pressure is mildly elevated.  There is moderate to severe left ventricular systolic dysfunction in the pattern of Takotsubo cardiomyopathy  The left ventricular ejection fraction is 25-35% by visual estimate.  There is no aortic valve stenosis.  Non-obstructive CAD.  Takotsubo cardiomyopathy. Plan for medical therapy. Reassess LV function in 6 weeks.    History of Present Illness     62 y.o. 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   Ms. Fulginiti a history of HTN, HLD and TIA, CVA + tobacco use,  Breast cancer with radiation,  Hepatitis, anxiety,  previous normal echo- not seen by Dr. Johnsie Cancel since 2014, but seen by [facility], cardiology 09/2017 with stress echo with some plaque per pt and some bleed.    Last TTE with EF 55-60% mild AR, no RWMA.   Pt now presented to ER yesterday with chest pain.  She had argument with son's girlfriend and son- they live with her, she was yelling and felt lightheaded, and syncopized was out for about 1 min.  She has done this in past with stress.  She did have the flu last month. She came to [facility] and troponin was 0.09, she was given ASA.  Plan for admit to [facility] if second troponin + which it was.   Heparing and NTG started pt was to go to stepdown at [facility]  No beds currently.    She has had chest pain for last 2 weeks, sometimes it is constant and stays all day.  Associated with SOB. She has had more DOE.    She is chest pain free on IV NTG and IV  Heparin.  She does have continued back pain.    CXR: Negative. No active cardiopulmonary disease troponins 0.10; 1.32; 1.76  K+ 3.6 Cr 0.69  H/H 13.4/ 39.6   The ECG that was done 12/23/17 SR no acute changes. Follow up EKG SR with T wave inversions in III, was personally reviewed.  Echo 12/24/17 with EF 50-55%,  G1DD, aortic valve with mild to moderately calcified annulus, new inferolateral and inf. Wall motion abnormalities are new.    Hospital Course     Consultants:   She was admitted to cardiology with unstable angina. She was taken to cardiac catheterization on arrival from OSH. Heart cath revealed. Nonobstructive disease but moderate to severe left ventricular systolic dysfunction in the pattern of Takotsubo cardiomyopathy. Medical therapy recommended.  LVEF 15% with inferolateral wall abnormality.   She tolerated heart catheterization well. She ambulated the halls without issues. She has an allergy to ACEI. Discharged on toprol 12.5 mg daily. Repeat echo in 6 weeks. Low sodium diet with less than 2g sodium daily and 2L fluid restriction daily. She will work with her PCP for anti-anxiety medications and therapies. She also has a statin allergy. Will repeat fasting lipid panel in outpatient setting.  _____________  Discharge Vitals Blood pressure (!) 89/52, pulse 62, temperature 98.8 F (37.1 C), temperature source Oral, resp. rate 14, height 5\' 6"  (1.676 m), weight 175 lb 11.3 oz (79.7 kg), SpO2 98 %.  Filed Weights   12/23/17 1454 12/23/17 1456 12/25/17 0410  Weight: 165 lb (74.8 kg) 178 lb (80.7 kg) 175 lb 11.3 oz (79.7 kg)    Labs & Radiologic Studies    CBC Recent Labs    12/24/17 0428 12/25/17 0654  WBC 7.6 7.1  HGB 11.4* 11.5*  HCT 34.8* 35.3*  MCV 89.0 90.1  PLT 183 676   Basic Metabolic Panel Recent Labs    12/23/17 1509 12/24/17 0953 12/25/17 0654  NA 140  --  140  K 3.6  --  4.1  CL 110  --  108  CO2 20*  --  24  GLUCOSE 94  --  106*  BUN 15  --  9  CREATININE 0.69  --  0.72  CALCIUM 9.2  --  8.6*  MG  --  1.9  --    Liver Function Tests Recent Labs    12/24/17 0953  AST 28  ALT 21  ALKPHOS 55  BILITOT 1.1  PROT 6.5  ALBUMIN 3.5   No results for input(s): LIPASE, AMYLASE in the last 72 hours. Cardiac Enzymes Recent Labs    12/24/17 1854  12/25/17 0035 12/25/17 0654  TROPONINI 1.79* 1.73* 1.17*   BNP Invalid input(s): POCBNP D-Dimer Recent Labs    12/23/17 1509  DDIMER 0.35   Hemoglobin A1C Recent Labs    12/24/17 0953  HGBA1C 5.2   Fasting Lipid Panel Recent Labs    12/24/17 0953  CHOL 200  HDL 76  LDLCALC 98  TRIG 131  CHOLHDL 2.6   Thyroid Function Tests Recent Labs    12/24/17 0953  TSH 1.333   _____________  Dg Chest 2 View  Result Date: 12/23/2017 CLINICAL DATA:  Chest pain and neck tightness. Nonproductive cough for several weeks. EXAM: CHEST - 2 VIEW COMPARISON:  04/29/2017 FINDINGS: The heart size and mediastinal contours are within normal limits. Both lungs are clear. The visualized skeletal structures are unremarkable. IMPRESSION: Negative.  No active cardiopulmonary disease. Electronically Signed   By: Sharrie Rothman.D.  On: 12/23/2017 15:39   Disposition   Pt is being discharged home today in good condition.  Follow-up Plans & Appointments    Follow-up Information    Erlene Quan, PA-C Follow up on 01/03/2018.   Specialties:  Cardiology, Radiology Why:  8:15 AM for TCM appt. Contact information: [address] [place] [address] [phone]          Discharge Instructions    Amb Referral to Cardiac Rehabilitation   Complete by:  As directed    Diagnosis:  NSTEMI   Diet - low sodium heart healthy   Complete by:  As directed    Discharge instructions   Complete by:  As directed    No driving for 2 days. No lifting over 5 lbs for 1 week. No sexual activity for 1 week. You may return to work in 1 week. Keep procedure site clean & dry. If you notice increased pain, swelling, bleeding or pus, call/return!  You may shower, but no soaking baths/hot tubs/pools for 1 week.   Increase activity slowly   Complete by:  As directed       Discharge Medications   Allergies as of 12/25/2017      Reactions   Statins Swelling, Other (See Comments)   Stomach  swelling & muscle ache   Ace Inhibitors Other (See Comments)   Other reaction(s): Palpitations   Sertraline Hcl Diarrhea   Ciprofloxacin Hcl Palpitations   Tramadol Other (See Comments), Anxiety   Extreme muscle pain Extreme agitation Extreme muscle pain Extreme agitation      Medication List    STOP taking these medications   naproxen 500 MG tablet Commonly known as:  NAPROSYN   permethrin 5 % cream Commonly known as:  ELIMITE     TAKE these medications   acetaminophen 500 MG tablet Commonly known as:  TYLENOL Take 1,500 mg by mouth daily as needed (back pain).   ALPRAZolam 1 MG tablet Commonly known as:  XANAX Take 1 tablet (1 mg total) by mouth 2 (two) times daily as needed for anxiety.   aspirin EC 81 MG tablet Take 81 mg by mouth daily.   Melatonin 10 MG Subl Place 1 tablet under the tongue at bedtime as needed (sleep).   methocarbamol 500 MG tablet Commonly known as:  ROBAXIN Take 1 tablet (500 mg total) by mouth 3 (three) times daily between meals as needed.   metoprolol succinate 25 MG 24 hr tablet Commonly known as:  TOPROL-XL Take 0.5 tablets (12.5 mg total) by mouth daily.   nitroGLYCERIN 0.4 MG SL tablet Commonly known as:  NITROSTAT Place 1 tablet (0.4 mg total) under the tongue every 5 (five) minutes x 3 doses as needed for chest pain.        Aspirin prescribed at discharge?  Yes High Intensity Statin Prescribed? (Lipitor 40-80mg  or Crestor 20-40mg ): No: allergy Beta Blocker Prescribed? Yes For EF <40%, was ACEI/ARB Prescribed? No: allergy ADP Receptor Inhibitor Prescribed? (i.e. Plavix etc.-Includes Medically Managed Patients): No: no stent For EF <40%, Aldosterone Inhibitor Prescribed? No: marginal pressure Was EF assessed during THIS hospitalization? Yes Was Cardiac Rehab II ordered? (Included Medically managed Patients): Yes   Outstanding Labs/Studies   FLP in 6-8 weeks with LFTs, may need referral to lipid clinic given her statin  allergy  Echo in three months  Dr. Oval Linsey in three months  Duration of Discharge Encounter   Greater than 30 minutes including physician time.  Signed, Ledora Bottcher NP  12/25/2017, 11:02 AM

## 2017-12-25 NOTE — Telephone Encounter (Signed)
TOC Patient-Please call Patient-Pt has an appointment with Kerin Ransom on 01-03-18.

## 2017-12-25 NOTE — Progress Notes (Signed)
CARDIAC REHAB PHASE I   PRE:  Rate/Rhythm: 68 SR    BP: sitting 101/64    SaO2:   MODE:  Ambulation: 400 ft   POST:  Rate/Rhythm: 81 SR    BP: sitting 118/81     SaO2:   Pt sts she is feeling great today. She felt good walking until the end when she began getting SOB with walking up a hill and talking. Rest x1. She is quite excitable and talkative. Ed completed including Takotsubo, restrictions, ex, diet, smoking cessation, sx of fluid overload, and CRPII. Will send referral to [facility]. She is eager to quit smoking and I gave resources.  [place], ACSM 12/25/2017 9:48 AM

## 2017-12-26 ENCOUNTER — Telehealth (HOSPITAL_COMMUNITY): Payer: Self-pay

## 2017-12-26 NOTE — Telephone Encounter (Signed)
Called patient to see if she is interested in the Cardiac Rehab Program and patient stated she is interested. Explained scheduling process to patient and patient verbalized understanding. Went over insurance and patient understands. Will contact patient for scheduling upon review by the RN Navigator.

## 2017-12-26 NOTE — Telephone Encounter (Signed)
Patient contacted regarding discharge from [facility] on 12/1717.  Patient understands to follow up with provider Kerin Ransom, [place] on 01/03/18 at 8:30 at the [facility]. Patient understands discharge instructions? yes Patient understands medications and regiment? yes Patient understands to bring all medications to this visit? yes  Patient stated that she felt good and was staying at her friend's house for now.

## 2017-12-26 NOTE — Telephone Encounter (Signed)
Patients insurance is active through [place]. Faxed over Medicaid Reimbursement form to [place].   Will contact patient to see if she is interested in the Cardiac Rehab Program. If interested, patient will be contacted for scheduling upon review by the RN Navigator.

## 2018-01-03 ENCOUNTER — Ambulatory Visit (INDEPENDENT_AMBULATORY_CARE_PROVIDER_SITE_OTHER): Payer: Medicaid Other | Admitting: Cardiology

## 2018-01-03 ENCOUNTER — Encounter: Payer: Self-pay | Admitting: Cardiology

## 2018-01-03 VITALS — BP 132/86 | HR 72 | Ht 66.0 in | Wt 170.0 lb

## 2018-01-03 DIAGNOSIS — I5181 Takotsubo syndrome: Secondary | ICD-10-CM | POA: Diagnosis not present

## 2018-01-03 DIAGNOSIS — Z79899 Other long term (current) drug therapy: Secondary | ICD-10-CM

## 2018-01-03 DIAGNOSIS — Z720 Tobacco use: Secondary | ICD-10-CM

## 2018-01-03 MED ORDER — LISINOPRIL 10 MG PO TABS: 10.0000 mg | ORAL_TABLET | Freq: Every day | ORAL | 2 refills | Status: DC

## 2018-01-03 NOTE — Assessment & Plan Note (Signed)
She has quit.  

## 2018-01-03 NOTE — Patient Instructions (Addendum)
Medication Instructions: Kerin Ransom, PA-C, has recommended making the following medication changes: 1. START Lisinopril 10 mg - take 1 tablet by mouth daily  Labwork: Your physician recommends that you return for lab work in 6 weeks on the day of your echocardiogram. There is a lab at our [facility] location.  Testing/Procedures: 1. Echocardiogram in 6 weeks - Your physician has requested that you have an echocardiogram. Echocardiography is a painless test that uses sound waves to create images of your heart. It provides your doctor with information about the size and shape of your heart and how well your heart's chambers and valves are working. This procedure takes approximately one hour. There are no restrictions for this procedure. >>This will be performed at our [facility] location [place], [place] [address] [phone]  Follow-up: Lurena Joiner recommends that you schedule a follow-up appointment after your echocardiogram with Dr Oval Linsey.  If you need a refill on your cardiac medications before your next appointment, please call your pharmacy.

## 2018-01-03 NOTE — Assessment & Plan Note (Signed)
4/14-4/16/19 with NSTEM in setting of emotional eventI- EF 25%, no significant CAD

## 2018-01-03 NOTE — Progress Notes (Signed)
01/03/2018 [name]   1955-11-07  [id number]  Primary Physician Chesley Noon, MD Primary Cardiologist: Dr Oval Linsey  HPI:  62 y/o female admitted 12/23/17 after a syncopal episode in the setting of a family argument. Her Troponin trurned positive and she went to cath which revelaed no significant CAD but cardiomyopathy with an EF of 25%. She was placed on low dose Toprol only, her course was complicated by hypotension. She is in the office today for follow up. She is doing well, she is working on managing her stress and not smoking. She denies orthopnea or dyspnea.    Current Outpatient Medications  Medication Sig Dispense Refill  . acetaminophen (TYLENOL) 500 MG tablet Take 1,500 mg by mouth daily as needed (back pain).    Marland Kitchen ALPRAZolam (XANAX) 1 MG tablet Take 1 tablet (1 mg total) by mouth 2 (two) times daily as needed for anxiety. 45 tablet 0  . aspirin EC 81 MG tablet Take 81 mg by mouth daily.    . Melatonin 10 MG SUBL Place 1 tablet under the tongue at bedtime as needed (sleep).    . methocarbamol (ROBAXIN) 500 MG tablet Take 1 tablet (500 mg total) by mouth 3 (three) times daily between meals as needed. (Patient not taking: Reported on 12/23/2017) 20 tablet 0  . metoprolol succinate (TOPROL-XL) 25 MG 24 hr tablet Take 0.5 tablets (12.5 mg total) by mouth daily. 90 tablet 3  . nitroGLYCERIN (NITROSTAT) 0.4 MG SL tablet Place 1 tablet (0.4 mg total) under the tongue every 5 (five) minutes x 3 doses as needed for chest pain. 25 tablet 1   No current facility-administered medications for this visit.     Allergies  Allergen Reactions  . Statins Swelling and Other (See Comments)    Stomach swelling & muscle ache  . Ace Inhibitors Other (See Comments)    Other reaction(s): Palpitations  . Sertraline Hcl Diarrhea  . Ciprofloxacin Hcl Palpitations  . Tramadol Other (See Comments) and Anxiety    Extreme muscle pain Extreme agitation Extreme muscle pain Extreme agitation     Past Medical History:  Diagnosis Date  . Adjustment disorder with mixed anxiety and depressed mood 01/05/2013  . Allergy   . Anxiety   . Arthritis    "knees,wrists" (07/31/2014)  . Brain aneurysm 2012   2cms.  . Breast cancer ([place]) 06/05/2013  . Breast disorder    lump in left breast  . Carcinoid tumor   . Complication of anesthesia    hallucinations , anxiety   . Depression   . H/O echocardiogram    not finding report in EPIC, pt. reports that it was before her appt. /w Dr. Johnsie Cancel   . Hepatitis B dx'd ~ 1980  . Hepatitis C dx'd ~ 2000   "specialist says it cleared itself on it's own"  . Hx of radiation therapy 07/17/13-09/03/13   left breast,60.4Gy, 32/33 fx completed  . Hyperlipidemia   . Hypertension    "never took my RX" (07/31/2014)  . Jaundice   . Malignant neoplasm of lower-outer quadrant of female breast ([place]) 05/19/2013  . Pain management    goes to [facility] clinic, but she doesn't intend to return   . Personal history of radiation therapy    2014  . Stroke [facility]) 2012   TIA  . TIA (transient ischemic attack) 06/2011   "can't see real well out of left eye since; weaker on left side since"  . Varicose veins   .  Walking pneumonia ~ 2008    Social History   Socioeconomic History  . Marital status: Married    Spouse name: Not on file  . Number of children: 3  . Years of education: Not on file  . Highest education level: Not on file  Occupational History  . Occupation: disabled  Social Needs  . Financial resource strain: Not on file  . Food insecurity:    Worry: Not on file    Inability: Not on file  . Transportation needs:    Medical: Not on file    Non-medical: Not on file  Tobacco Use  . Smoking status: Former Smoker    Packs/day: 0.50    Years: 36.00    Pack years: 18.00    Types: Cigarettes    Last attempt to quit: 12/22/2017    Years since quitting: 0.0  . Smokeless tobacco: Never Used  Substance and Sexual Activity  . Alcohol use: Yes     Alcohol/week: 2.4 oz    Types: 4 Glasses of wine per week  . Drug use: Yes    Types: Marijuana    Comment: "last marijuana ~ 06/2014  . Sexual activity: Yes    Birth control/protection: None  Lifestyle  . Physical activity:    Days per week: Not on file    Minutes per session: Not on file  . Stress: Not on file  Relationships  . Social connections:    Talks on phone: Not on file    Gets together: Not on file    Attends religious service: Not on file    Active member of club or organization: Not on file    Attends meetings of clubs or organizations: Not on file    Relationship status: Not on file  . Intimate partner violence:    Fear of current or ex partner: Not on file    Emotionally abused: Not on file    Physically abused: Not on file    Forced sexual activity: Not on file  Other Topics Concern  . Not on file  Social History Narrative  . Not on file     Family History  Problem Relation Age of Onset  . Diabetes Paternal Grandfather   . Diabetes Paternal Grandmother   . Diabetes Mother   . Heart disease Mother   . Cancer Mother        cervical  . Diabetes Brother   . Stroke Brother   . Heart disease Brother   . Heart disease Sister   . Diabetes Sister   . Cancer Sister        cervical  . Arrhythmia Sister   . Cancer Father        lung     Review of Systems: General: negative for chills, fever, night sweats or weight changes.  Cardiovascular: negative for chest pain, dyspnea on exertion, edema, orthopnea, palpitations, paroxysmal nocturnal dyspnea or shortness of breath Dermatological: negative for rash Respiratory: negative for cough or wheezing Urologic: negative for hematuria Abdominal: negative for nausea, vomiting, diarrhea, bright red blood per rectum, melena, or hematemesis Neurologic: negative for visual changes, syncope, or dizziness All other systems reviewed and are otherwise negative except as noted above.    Blood pressure 132/86, pulse 72,  height 5\' 6"  (1.676 m), weight 170 lb (77.1 kg).  General appearance: alert, cooperative and no distress Neck: no carotid bruit and no JVD Lungs: clear to auscultation bilaterally Heart: regular rate and rhythm Extremities: extremities normal, atraumatic, no cyanosis  or edema Skin: Skin color, texture, turgor normal. No rashes or lesions Neurologic: Grossly normal   ASSESSMENT AND PLAN:   Takotsubo cardiomyopathy 4/14-4/16/19 with NSTEM in setting of emotional eventI- EF 25%, no significant CAD  Tobacco abuse She has quit   PLAN  She has agreed to try Lisinopril, BMP and echo in 6 weeks, then f/u with Dr Oval Linsey.   Kerin Ransom PA-C 01/03/2018 8:51 AM

## 2018-01-16 ENCOUNTER — Telehealth (HOSPITAL_COMMUNITY): Payer: Self-pay

## 2018-01-16 NOTE — Telephone Encounter (Signed)
Called patient to schedule Cardiac Rehab. Patient stated that now is not a good time as she has a bunch of appts with her PCP. She would like for me to call her in June. Will follow up then.

## 2018-02-13 ENCOUNTER — Telehealth (HOSPITAL_COMMUNITY): Payer: Self-pay

## 2018-02-13 NOTE — Telephone Encounter (Signed)
Called to follow up with patient in regards to Cardiac Rehab - Patient stated she will not participate in the program at this time and she is getting plenty of exercise on her own. Closed referral.

## 2018-02-15 ENCOUNTER — Inpatient Hospital Stay: Payer: Medicaid Other | Attending: Hematology and Oncology | Admitting: Hematology and Oncology

## 2018-02-15 ENCOUNTER — Inpatient Hospital Stay: Payer: Medicaid Other

## 2018-02-15 ENCOUNTER — Telehealth: Payer: Self-pay | Admitting: Hematology and Oncology

## 2018-02-15 DIAGNOSIS — D0512 Intraductal carcinoma in situ of left breast: Secondary | ICD-10-CM | POA: Diagnosis not present

## 2018-02-15 DIAGNOSIS — F17218 Nicotine dependence, cigarettes, with other nicotine-induced disorders: Secondary | ICD-10-CM | POA: Insufficient documentation

## 2018-02-15 DIAGNOSIS — Z923 Personal history of irradiation: Secondary | ICD-10-CM | POA: Diagnosis not present

## 2018-02-15 DIAGNOSIS — I252 Old myocardial infarction: Secondary | ICD-10-CM | POA: Insufficient documentation

## 2018-02-15 DIAGNOSIS — K769 Liver disease, unspecified: Secondary | ICD-10-CM | POA: Diagnosis not present

## 2018-02-15 DIAGNOSIS — Z86000 Personal history of in-situ neoplasm of breast: Secondary | ICD-10-CM | POA: Diagnosis present

## 2018-02-15 DIAGNOSIS — C7A010 Malignant carcinoid tumor of the duodenum: Secondary | ICD-10-CM

## 2018-02-15 DIAGNOSIS — Z85020 Personal history of malignant carcinoid tumor of stomach: Secondary | ICD-10-CM | POA: Diagnosis not present

## 2018-02-15 LAB — CBC WITH DIFFERENTIAL/PLATELET
BASOS PCT: 0 %
Basophils Absolute: 0 10*3/uL (ref 0.0–0.1)
EOS ABS: 0.1 10*3/uL (ref 0.0–0.5)
Eosinophils Relative: 2 %
HEMATOCRIT: 39.6 % (ref 34.8–46.6)
Hemoglobin: 13 g/dL (ref 11.6–15.9)
Lymphocytes Relative: 25 %
Lymphs Abs: 1.5 10*3/uL (ref 0.9–3.3)
MCH: 29.1 pg (ref 25.1–34.0)
MCHC: 32.8 g/dL (ref 31.5–36.0)
MCV: 88.8 fL (ref 79.5–101.0)
MONO ABS: 0.5 10*3/uL (ref 0.1–0.9)
MONOS PCT: 8 %
NEUTROS ABS: 4 10*3/uL (ref 1.5–6.5)
Neutrophils Relative %: 65 %
PLATELETS: 202 10*3/uL (ref 145–400)
RBC: 4.46 MIL/uL (ref 3.70–5.45)
RDW: 13.9 % (ref 11.2–14.5)
WBC: 6.1 10*3/uL (ref 3.9–10.3)

## 2018-02-15 LAB — COMPREHENSIVE METABOLIC PANEL
ALBUMIN: 3.8 g/dL (ref 3.5–5.0)
ALK PHOS: 64 U/L (ref 40–150)
ALT: 11 U/L (ref 0–55)
AST: 12 U/L (ref 5–34)
Anion gap: 8 (ref 3–11)
BILIRUBIN TOTAL: 0.4 mg/dL (ref 0.2–1.2)
BUN: 17 mg/dL (ref 7–26)
CALCIUM: 9.1 mg/dL (ref 8.4–10.4)
CO2: 25 mmol/L (ref 22–29)
CREATININE: 0.88 mg/dL (ref 0.60–1.10)
Chloride: 109 mmol/L (ref 98–109)
GFR calc Af Amer: >60 mL/min (ref >60)
GFR calc non Af Amer: >60 mL/min (ref >60)
GLUCOSE: 97 mg/dL (ref 70–140)
Potassium: 4.9 mmol/L (ref 3.5–5.1)
SODIUM: 142 mmol/L (ref 136–145)
TOTAL PROTEIN: 6.5 g/dL (ref 6.4–8.3)

## 2018-02-15 NOTE — Assessment & Plan Note (Signed)
Status post bowel resection July 2014 at the same time that she had lumpectomy. She has a liver lesion that is being followed. She does not have any symptoms related to carcinoid syndrome and previous chromogranin A levels have all been normal.  CT chest abdomen pelvis 06/25/2015 showed stable 1.8 x 2 cm enhancing lesion within the right lobe of the liver. No need for additional scans because it has demonstrated adequate stability  Tobacco abuse:

## 2018-02-15 NOTE — Assessment & Plan Note (Signed)
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 Breast exam done 02/15/2018 benign  2. Mammogram  07/02/2017 stable lumpectomy changes no evidence of malignancy.  Return to clinic in 1 year for surveillance checks and follow-up

## 2018-02-15 NOTE — Telephone Encounter (Signed)
Gave avs and calendar ° °

## 2018-02-15 NOTE — Progress Notes (Signed)
Patient Care Team: Chesley Noon, MD as PCP - General (Family Medicine) Skeet Latch, MD as PCP - Cardiology (Cardiology)  DIAGNOSIS:  Encounter Diagnoses  Name Primary?  . Ductal carcinoma in situ (DCIS) of left breast   . 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  She had a mammogram in October 2018 which was normal.  She denies any lumps or nodules in the breast.  She had a previous resection of the carcinoid tumor of the small bowel.  She had resection and is currently in surveillance.  She had a CT scan last year which did not show any concerning findings.  The decision was made to not repeat another CT scan unless there are major concerns.  There are no signs and symptoms of carcinoid syndrome.  REVIEW OF SYSTEMS:   Constitutional: Denies fevers, chills or abnormal weight loss Eyes: Denies blurriness of vision Ears, nose, mouth, throat, and face: Denies mucositis or sore throat Respiratory: Denies cough, dyspnea or wheezes Cardiovascular: Denies palpitation, chest  discomfort Gastrointestinal:  Denies nausea, heartburn or change in bowel habits Skin: Denies abnormal skin rashes Lymphatics: Denies new lymphadenopathy or easy bruising Neurological:Denies numbness, tingling or new weaknesses Behavioral/Psych: Mood is stable, no new changes  Extremities: No lower extremity edema Breast: Tenderness in the left breast All other systems were reviewed with the patient and are negative.  I have reviewed the past medical history, past surgical history, social history and family history with the patient and they are unchanged from previous note.  ALLERGIES:  is allergic to statins; ace inhibitors; sertraline hcl; ciprofloxacin hcl; and tramadol.  MEDICATIONS:  Current Outpatient Medications  Medication Sig Dispense Refill  . acetaminophen (TYLENOL) 500 MG tablet Take 1,500 mg by mouth daily as needed (back pain).    Marland Kitchen ALPRAZolam (XANAX) 1 MG tablet Take 1 tablet (1 mg total) by mouth 2 (two) times daily as needed for anxiety. 45 tablet 0  . aspirin EC 81 MG tablet Take 81 mg by mouth daily.    Marland Kitchen lisinopril (PRINIVIL,ZESTRIL) 10 MG tablet Take 1 tablet (10 mg total) by mouth daily. 30 tablet 2  . Melatonin 10 MG SUBL Place 1 tablet under the tongue at bedtime as needed (sleep).    . metoprolol succinate (TOPROL-XL) 25 MG 24 hr tablet Take 0.5 tablets (12.5 mg total) by mouth daily. 90 tablet 3  . nitroGLYCERIN (NITROSTAT) 0.4 MG SL tablet Place 1 tablet (0.4 mg total) under the tongue every 5 (five) minutes x 3 doses as needed for chest pain. 25 tablet  1   No current facility-administered medications for this visit.     PHYSICAL EXAMINATION: ECOG PERFORMANCE STATUS: 1 - Symptomatic but completely ambulatory  Vitals:   02/15/18 0828  BP: (!) 147/85  Pulse: (!) 55  Resp: 19  Temp: 98.4 F (36.9 C)  SpO2: 98%   Filed Weights   02/15/18 0828  Weight: 168 lb 11.2 oz (76.5 kg)    GENERAL:alert, no distress and comfortable SKIN: skin color, texture,  turgor are normal, no rashes or significant lesions EYES: normal, Conjunctiva are pink and non-injected, sclera clear OROPHARYNX:no exudate, no erythema and lips, buccal mucosa, and tongue normal  NECK: supple, thyroid normal size, non-tender, without nodularity LYMPH:  no palpable lymphadenopathy in the cervical, axillary or inguinal LUNGS: clear to auscultation and percussion with normal breathing effort HEART: regular rate & rhythm and no murmurs and no lower extremity edema ABDOMEN:abdomen soft, non-tender and normal bowel sounds MUSCULOSKELETAL:no cyanosis of digits and no clubbing  NEURO: alert & oriented x 3 with fluent speech, no focal motor/sensory deficits EXTREMITIES: No lower extremity edema BREAST: Left breast tenderness no palpable lumps or nodules (exam performed in the presence of a chaperone)  LABORATORY DATA:  I have reviewed the data as listed CMP Latest Ref Rng & Units 12/25/2017 12/24/2017 12/23/2017  Glucose 65 - 99 mg/dL 106(H) - 94  BUN 6 - 20 mg/dL 9 - 15  Creatinine 0.44 - 1.00 mg/dL 0.72 - 0.69  Sodium 135 - 145 mmol/L 140 - 140  Potassium 3.5 - 5.1 mmol/L 4.1 - 3.6  Chloride 101 - 111 mmol/L 108 - 110  CO2 22 - 32 mmol/L 24 - 20(L)  Calcium 8.9 - 10.3 mg/dL 8.6(L) - 9.2  Total Protein 6.5 - 8.1 g/dL - 6.5 -  Total Bilirubin 0.3 - 1.2 mg/dL - 1.1 -  Alkaline Phos 38 - 126 U/L - 55 -  AST 15 - 41 U/L - 28 -  ALT 14 - 54 U/L - 21 -    Lab Results  Component Value Date   WBC 6.1 02/15/2018   HGB 13.0 02/15/2018   HCT 39.6 02/15/2018   MCV 88.8 02/15/2018   PLT 202 02/15/2018   NEUTROABS 4.0 02/15/2018    ASSESSMENT & PLAN:  Ductal carcinoma in situ (DCIS) of left breast Left breast DCIS 3.5 cm ER/PR positive status post lumpectomy and radiation therapy declined tamoxifen therapy, currently on surveillance  Surveillance: 1. Breast exam done 02/15/2018 benign  2. Mammogram  07/02/2017 stable lumpectomy changes no evidence of malignancy.  Return to  clinic in 1 year for surveillance checks and follow-up  Malignant carcinoid tumor of duodenum Status post bowel resection July 2014 at the same time that she had lumpectomy. She has a liver lesion that is being followed. She does not have any symptoms related to carcinoid syndrome and previous chromogranin A levels have all been normal.  CT chest abdomen pelvis 06/25/2015 showed stable 1.8 x 2 cm enhancing lesion within the right lobe of the liver. No need for additional scans because it has demonstrated adequate stability  Tobacco abuse: Patient had a recent heart attack and is trying to quit smoking.  Non-ST elevation MI  Return to clinic in 1 year for surveillance  No orders of the defined types were placed in this encounter.  The patient has a good understanding of the overall plan. she agrees with it. she will call with any problems that may develop before the next visit here.   Nelle Don  Lindi Adie, MD 02/15/18

## 2018-02-18 LAB — CHROMOGRANIN A: Chromogranin A: 3 nmol/L (ref 0–5)

## 2018-02-19 ENCOUNTER — Encounter (HOSPITAL_COMMUNITY): Payer: Self-pay | Admitting: Psychiatry

## 2018-02-19 ENCOUNTER — Other Ambulatory Visit: Payer: Self-pay

## 2018-02-19 ENCOUNTER — Ambulatory Visit (INDEPENDENT_AMBULATORY_CARE_PROVIDER_SITE_OTHER): Payer: Medicaid Other | Admitting: Psychiatry

## 2018-02-19 VITALS — BP 130/80 | HR 74 | Ht 66.0 in | Wt 170.0 lb

## 2018-02-19 DIAGNOSIS — F411 Generalized anxiety disorder: Secondary | ICD-10-CM | POA: Diagnosis not present

## 2018-02-19 DIAGNOSIS — G894 Chronic pain syndrome: Secondary | ICD-10-CM

## 2018-02-19 DIAGNOSIS — F4323 Adjustment disorder with mixed anxiety and depressed mood: Secondary | ICD-10-CM

## 2018-02-19 MED ORDER — FLUOXETINE HCL 20 MG PO CAPS: 20.0000 mg | ORAL_CAPSULE | Freq: Every day | ORAL | 0 refills | Status: DC

## 2018-02-19 NOTE — Patient Instructions (Signed)
Recommend therapy  Cut down xanax dose slowly . Do not stop or taper down dose abruptly
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
## 2018-02-20 ENCOUNTER — Encounter (HOSPITAL_COMMUNITY): Payer: Self-pay | Admitting: Emergency Medicine

## 2018-02-20 ENCOUNTER — Other Ambulatory Visit: Payer: Self-pay

## 2018-02-20 ENCOUNTER — Ambulatory Visit (HOSPITAL_COMMUNITY)
Admission: EM | Admit: 2018-02-20 | Discharge: 2018-02-20 | Disposition: A | Discharge status: Home / Self Care | Payer: Medicaid Other | Attending: Family Medicine | Admitting: Family Medicine

## 2018-02-20 DIAGNOSIS — B850 Pediculosis due to Pediculus humanus capitis: Secondary | ICD-10-CM | POA: Diagnosis not present

## 2018-02-20 MED ORDER — PERMETHRIN 5 % EX CREA: TOPICAL_CREAM | CUTANEOUS | 0 refills | Status: DC

## 2018-02-20 NOTE — ED Provider Notes (Signed)
[place]    CSN: [id number] Arrival date & time: 02/20/18  1255     History   Chief Complaint Chief Complaint  Patient presents with  . Head Lice    HPI [name] is a 62 y.o. female presenting today for evaluation of head lice.  2 daughters with active lice infestations.  Has treated her own hair with over-the-counter remedies.  Has noted some itching today.  Concerned because treatments for 2 granddaughters have not helped.  HPI  Past Medical History:  Diagnosis Date  . Adjustment disorder with mixed anxiety and depressed mood 01/05/2013  . Allergy   . Anxiety   . Arthritis    "knees,wrists" (07/31/2014)  . Brain aneurysm 2012   2cms.  . Breast cancer ([place]) 06/05/2013  . Breast disorder    lump in left breast  . Carcinoid tumor   . Complication of anesthesia    hallucinations , anxiety   . Depression   . H/O echocardiogram    not finding report in EPIC, pt. reports that it was before her appt. /w Dr. Johnsie Cancel   . Hepatitis B dx'd ~ 1980  . Hepatitis C dx'd ~ 2000   "specialist says it cleared itself on it's own"  . Hx of radiation therapy 07/17/13-09/03/13   left breast,60.4Gy, 32/33 fx completed  . Hyperlipidemia   . Hypertension    "never took my RX" (07/31/2014)  . Jaundice   . Malignant neoplasm of lower-outer quadrant of female breast ([place]) 05/19/2013  . Pain management    goes to [facility] clinic, but she doesn't intend to return   . Personal history of radiation therapy    2014  . Stroke [facility]) 2012   TIA  . TIA (transient ischemic attack) 06/2011   "can't see real well out of left eye since; weaker on left side since"  . Varicose veins   . Walking pneumonia ~ 2008    Patient Active Problem List   Diagnosis Date Noted  . Takotsubo cardiomyopathy 12/25/2017  . Cerebrovascular accident (CVA) ([place])   . 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 Elevated troponin   . NSTEMI (non-ST elevated myocardial infarction)  ([place]) 12/23/2017  . Head lice infestation 82/42/3536  . Chest pain 07/31/2014  . 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 Partial small bowel obstruction ([place]) 04/07/2013  . SBO (small bowel obstruction) ([place]) 01/05/2013  . Adjustment disorder with mixed anxiety and depressed mood 01/05/2013  . Chronic pain syndrome 01/05/2013  . Thyroid cyst 01/05/2013  . Tobacco abuse 12/05/2012  . Hepatitis C   . Jaundice   . Hyperlipidemia   . Varicose veins   . Brain aneurysm   . TIA (transient ischemic attack) 06/12/2011    Past Surgical History:  Procedure Laterality Date  . BREAST BIOPSY Left 03/2013; 05/2013  . BREAST LUMPECTOMY Left 04/02/2013   Procedure: Excision  of Left Breast Mass;  Surgeon: Harl Bowie, MD;  Location: [place];  Service: General;  Laterality: Left;  . BREAST LUMPECTOMY WITH SENTINEL LYMPH NODE BIOPSY Left 06/05/2013   Procedure: BREAST LUMPECTOMY WITH SENTINEL LYMPH NODE BX;  Surgeon: Harl Bowie, MD;  Location: [place];  Service: General;  Laterality: Left;  . CARDIAC CATHETERIZATION  12/24/2017  . COLON SURGERY  03/2013   bowel obstruction , malignant tumor    . DILATION AND CURETTAGE OF UTERUS  ~  1990   S/P miscarriage  . EVACUATION BREAST HEMATOMA Left 06/05/2013   Procedure: EVACUATION HEMATOMA BREAST;  Surgeon: Harl Bowie, MD;  Location: [place];  Service: General;  Laterality: Left;  . LAPAROSCOPY N/A 04/02/2013   Procedure: Diaganostic Lap.; Small Bowel Resection;  Surgeon: Harl Bowie, MD;  Location: [place];  Service: General;  Laterality: N/A;  . LEFT HEART CATH AND CORONARY ANGIOGRAPHY N/A 12/24/2017   Procedure: LEFT HEART CATH AND CORONARY ANGIOGRAPHY;  Surgeon: Jettie Booze, MD;  Location: [place] CV LAB;  Service: Cardiovascular;  Laterality: N/A;  . TUMOR EXCISION  04/02/2013   "carcoid tumor removed"  . vaginal births   60    birthed twins at  -[redacted] weeks gestation - both survived , total of 3 pregnancies - all born vaginally    OB History    Gravida  5   Para  4   Term      Preterm  2   AB  1   Living  4     SAB      TAB  1   Ectopic      Multiple      Live Births               Home Medications    Prior to Admission medications   Medication Sig Start Date End Date Taking? Authorizing Provider  acetaminophen (TYLENOL) 500 MG tablet Take 1,500 mg by mouth daily as needed (back pain).    [provider]  ALPRAZolam Duanne Moron) 1 MG tablet Take 1 tablet (1 mg total) by mouth 2 (two) times daily as needed for anxiety. 06/04/15   Nicholas Lose, MD  aspirin EC 81 MG tablet Take 81 mg by mouth daily.    [provider]  FLUoxetine (PROZAC) 20 MG capsule Take 1 capsule (20 mg total) by mouth daily. Delete prior refills 02/19/18   Merian Capron, MD  lisinopril (PRINIVIL,ZESTRIL) 10 MG tablet Take 1 tablet (10 mg total) by mouth daily. 01/03/18 04/03/18  Erlene Quan, PA-C  Melatonin 10 MG SUBL Place 1 tablet under the tongue at bedtime as needed (sleep).    [provider]  metoprolol succinate (TOPROL-XL) 25 MG 24 hr tablet Take 0.5 tablets (12.5 mg total) by mouth daily. 12/25/17   Duke, Tami Lin, PA  nitroGLYCERIN (NITROSTAT) 0.4 MG SL tablet Place 1 tablet (0.4 mg total) under the tongue every 5 (five) minutes x 3 doses as needed for chest pain. 12/25/17   Duke, Tami Lin, PA  permethrin (ELIMITE) 5 % cream Apply to affected area once 02/20/18   Nathanial Arrighi, Elesa Hacker, PA-C    Family History Family History  Problem Relation Age of Onset  . Diabetes Paternal Grandfather   . Diabetes Paternal Grandmother   . Diabetes Mother   . Heart disease Mother   . Cancer Mother        cervical  . Diabetes Brother   . Stroke Brother   . Heart disease Brother   . Heart disease Sister   . Diabetes Sister   . Cancer Sister        cervical  . Arrhythmia Sister   . Cancer Father        lung     Social History Social History   Tobacco Use  . Smoking status: Current Some Day Smoker    Packs/day: 0.50    Years: 36.00    Pack years: 18.00    Types: Cigarettes  Last attempt to quit: 12/22/2017    Years since quitting: 0.1  . Smokeless tobacco: Never Used  . Tobacco comment: smokes some days, use patch some days  Substance Use Topics  . Alcohol use: Yes    Alcohol/week: 1.8 oz    Types: 3 Glasses of wine per week  . Drug use: Yes    Types: Marijuana    Comment: "last marijuana ~ 06/2014     Allergies   Statins; Ace inhibitors; Sertraline hcl; Ciprofloxacin hcl; and Tramadol   Review of Systems Review of Systems  Constitutional: Negative for fatigue and fever.  Eyes: Negative for visual disturbance.  Respiratory: Negative for shortness of breath.   Cardiovascular: Negative for chest pain.  Gastrointestinal: Negative for abdominal pain, nausea and vomiting.  Musculoskeletal: Negative for arthralgias and joint swelling.  Skin: Negative for color change, rash and wound.  Neurological: Negative for dizziness, weakness, light-headedness and headaches.     Physical Exam Triage Vital Signs ED Triage Vitals  Enc Vitals Group     BP      Pulse      Resp      Temp      Temp src      SpO2      Weight      Height      Head Circumference      Peak Flow      Pain Score      Pain Loc      Pain Edu?      Excl. in [place]?    No data found.  Updated Vital Signs BP 130/75 (BP Location: Right Arm)   Pulse (!) 57   Temp 98.3 F (36.8 C) (Oral)   Resp 20   SpO2 98%   Visual Acuity Right Eye Distance:   Left Eye Distance:   Bilateral Distance:    Right Eye Near:   Left Eye Near:    Bilateral Near:     Physical Exam  Constitutional: She appears well-developed and well-nourished. No distress.  HENT:  Head: Normocephalic and atraumatic.  Small white flakes seen in scalp, unclear if this is dandruff or nits  Eyes: Conjunctivae are normal.  Neck: Neck  supple.  Cardiovascular: Normal rate and regular rhythm.  No murmur heard. Pulmonary/Chest: Effort normal and breath sounds normal. No respiratory distress.  Abdominal: Soft. There is no tenderness.  Musculoskeletal: She exhibits no edema.  Neurological: She is alert.  Skin: Skin is warm and dry.  Psychiatric: She has a normal mood and affect.  Nursing note and vitals reviewed.    UC Treatments / Results  Labs (all labs ordered are listed, but only abnormal results are displayed) Labs Reviewed - No data to display  EKG None  Radiology No results found.  Procedures Procedures (including critical care time)  Medications Ordered in UC Medications - No data to display  Initial Impression / Assessment and Plan / UC Course  I have reviewed the triage vital signs and the nursing notes.  Pertinent labs & imaging results that were available during my care of the patient were reviewed by me and considered in my medical decision making (see chart for details).     We will go ahead and treat with permethrin given exposure.Discussed strict return precautions. Patient verbalized understanding and is agreeable with plan.  Final Clinical Impressions(s) / UC Diagnoses   Final diagnoses:  Head lice infestation   Discharge Instructions   None    ED Prescriptions  Medication Sig Dispense Auth. Provider   permethrin (ELIMITE) 5 % cream Apply to affected area once 60 g Gustavo Dispenza, [place] C, PA-C     Controlled Substance Prescriptions [place] Controlled Substance Registry consulted? Not Applicable   Janith Lima, [place] 02/20/18 1403

## 2018-02-20 NOTE — ED Triage Notes (Signed)
concerns for head lice.  Patient has not found any, but has been assisting to grandchildren with lice. Patient's head itches

## 2018-03-02 ENCOUNTER — Emergency Department (HOSPITAL_COMMUNITY): Payer: Medicaid Other

## 2018-03-02 ENCOUNTER — Encounter (HOSPITAL_COMMUNITY): Payer: Self-pay

## 2018-03-02 ENCOUNTER — Other Ambulatory Visit: Payer: Self-pay

## 2018-03-02 ENCOUNTER — Ambulatory Visit (HOSPITAL_COMMUNITY)
Admission: EM | Admit: 2018-03-02 | Discharge: 2018-03-02 | Disposition: A | Discharge status: Home / Self Care | Payer: Medicaid Other

## 2018-03-02 ENCOUNTER — Encounter (HOSPITAL_COMMUNITY): Payer: Self-pay | Admitting: Emergency Medicine

## 2018-03-02 ENCOUNTER — Emergency Department (HOSPITAL_COMMUNITY)
Admission: EM | Admit: 2018-03-02 | Discharge: 2018-03-02 | Disposition: A | Discharge status: Home / Self Care | Payer: Medicaid Other | Attending: Emergency Medicine | Admitting: Emergency Medicine

## 2018-03-02 DIAGNOSIS — Z853 Personal history of malignant neoplasm of breast: Secondary | ICD-10-CM | POA: Diagnosis not present

## 2018-03-02 DIAGNOSIS — Z8679 Personal history of other diseases of the circulatory system: Secondary | ICD-10-CM

## 2018-03-02 DIAGNOSIS — F17210 Nicotine dependence, cigarettes, uncomplicated: Secondary | ICD-10-CM | POA: Diagnosis not present

## 2018-03-02 DIAGNOSIS — R0602 Shortness of breath: Secondary | ICD-10-CM | POA: Diagnosis not present

## 2018-03-02 DIAGNOSIS — Z7982 Long term (current) use of aspirin: Secondary | ICD-10-CM | POA: Insufficient documentation

## 2018-03-02 DIAGNOSIS — I10 Essential (primary) hypertension: Secondary | ICD-10-CM | POA: Diagnosis not present

## 2018-03-02 DIAGNOSIS — R079 Chest pain, unspecified: Secondary | ICD-10-CM | POA: Diagnosis not present

## 2018-03-02 DIAGNOSIS — Z79899 Other long term (current) drug therapy: Secondary | ICD-10-CM | POA: Insufficient documentation

## 2018-03-02 LAB — CBC
HCT: 45.1 % (ref 36.0–46.0)
Hemoglobin: 14.3 g/dL (ref 12.0–15.0)
MCH: 28.8 pg (ref 26.0–34.0)
MCHC: 31.7 g/dL (ref 30.0–36.0)
MCV: 90.7 fL (ref 78.0–100.0)
PLATELETS: 231 10*3/uL (ref 150–400)
RBC: 4.97 MIL/uL (ref 3.87–5.11)
RDW: 13.6 % (ref 11.5–15.5)
WBC: 7.1 10*3/uL (ref 4.0–10.5)

## 2018-03-02 LAB — BASIC METABOLIC PANEL
Anion gap: 7 (ref 5–15)
BUN: 9 mg/dL (ref 6–20)
CALCIUM: 9.2 mg/dL (ref 8.9–10.3)
CHLORIDE: 107 mmol/L (ref 101–111)
CO2: 26 mmol/L (ref 22–32)
CREATININE: 0.74 mg/dL (ref 0.44–1.00)
GFR calc non Af Amer: >60 mL/min (ref >60)
Glucose, Bld: 97 mg/dL (ref 65–99)
Potassium: 4.2 mmol/L (ref 3.5–5.1)
Sodium: 140 mmol/L (ref 135–145)

## 2018-03-02 LAB — I-STAT TROPONIN, ED
TROPONIN I, POC: 0 ng/mL (ref 0.00–0.08)
Troponin i, poc: 0 ng/mL (ref 0.00–0.08)

## 2018-03-02 MED ORDER — FENTANYL CITRATE (PF) 100 MCG/2ML IJ SOLN
50.0000 ug | Freq: Once | INTRAMUSCULAR | Status: AC
  Administered 2018-03-02: 50 ug via INTRAVENOUS
  Filled 2018-03-02: qty 2

## 2018-03-02 MED ORDER — IOPAMIDOL (ISOVUE-370) INJECTION 76%
INTRAVENOUS | Status: AC
  Filled 2018-03-02: qty 100

## 2018-03-02 MED ORDER — ONDANSETRON HCL 4 MG/2ML IJ SOLN
4.0000 mg | Freq: Once | INTRAMUSCULAR | Status: AC
  Administered 2018-03-02: 4 mg via INTRAVENOUS
  Filled 2018-03-02: qty 2

## 2018-03-02 MED ORDER — IOPAMIDOL (ISOVUE-370) INJECTION 76%
100.0000 mL | Freq: Once | INTRAVENOUS | Status: AC | PRN
  Administered 2018-03-02: 100 mL via INTRAVENOUS

## 2018-03-02 MED ORDER — KETOROLAC TROMETHAMINE 15 MG/ML IJ SOLN
15.0000 mg | Freq: Once | INTRAMUSCULAR | Status: AC
  Administered 2018-03-02: 15 mg via INTRAVENOUS
  Filled 2018-03-02: qty 1

## 2018-03-02 NOTE — ED Provider Notes (Signed)
62 yo F with PMHx of takasubo cardiomyopathy a few months ago who who presents with chest pain this AM. Not resolved with NTG. Accompanied by DOE, dry cough. Recent negative cath.   Will f/u CTA chest, repeat trop at 5pm. If negative, okay for d/c home with cardiology f/u.   Course:  CTA chest study with no aneurysm or dissection.  Repeat trop neg. Pt improved. Will f/u with cardiologist as outpt. D/c home in stable condition, return precautions discussed. Patient agreeable with plan for d/c home.  Clinical Impression: 1. Chest pain, unspecified type     Dispo: Discharge    Norm Salt, MD 03/02/18 [id number]    Merrily Pew, MD 03/02/18 2355

## 2018-03-02 NOTE — ED Notes (Signed)
PA-C at bedside 

## 2018-03-02 NOTE — ED Triage Notes (Signed)
Pt presents for evaluation of chest pain with radiation to back. States hx of heart attack 1 month ago. States sent from [facility] for abnormal EKG.

## 2018-03-02 NOTE — ED Notes (Signed)
Pt sent to the ER for further evaluation. Understands the importance of going straight there and not stopping anywhere on the way.

## 2018-03-02 NOTE — Discharge Instructions (Addendum)
Go to ER immediately for further evaluation and cardiology consultation

## 2018-03-02 NOTE — ED Provider Notes (Signed)
[facility] EMERGENCY DEPARTMENT Provider Note   CSN: [id number] Arrival date & time: 03/02/18  1312     History   Chief Complaint Chief Complaint  Patient presents with  . Chest Pain    HPI [name] is a 62 y.o. female who presents the emergency department chief complaint of chest pain shortness of breath.  She has an extensive past medical history.  In April 2018 the patient was admitted with an NSTEMI, had a negative cardiac catheterization and was told that she was having a taco tubo cardiomyopathy.  Patient states that this morning she awoke around 4 AM with nagging, heavy retrosternal chest pain that radiated to her back.  She states that it has been persistent, 7 out of 10 since 5:00 in the morning.  It is worse when she exerts herself.  She states that it hurts when she coughs or takes a deep inhalation.  She took a nitroglycerin around around 7 AM with minimal relief of her symptoms.  Patient was seen earlier at an urgent care and had an EKG and was told to come immediately to the hospital.  Review of her current EKG shows T wave inversions in the inferior leads however this was also noted on 12/25/2017.  She denies any neurologic symptoms, nausea, diaphoresis or vomiting.   HPI  Past Medical History:  Diagnosis Date  . Adjustment disorder with mixed anxiety and depressed mood 01/05/2013  . Allergy   . Anxiety   . Arthritis    "knees,wrists" (07/31/2014)  . Brain aneurysm 2012   2cms.  . Breast cancer ([place]) 06/05/2013  . Breast disorder    lump in left breast  . Carcinoid tumor   . Complication of anesthesia    hallucinations , anxiety   . Depression   . H/O echocardiogram    not finding report in EPIC, pt. reports that it was before her appt. /w Dr. Johnsie Cancel   . Hepatitis B dx'd ~ 1980  . Hepatitis C dx'd ~ 2000   "specialist says it cleared itself on it's own"  . Hx of radiation therapy 07/17/13-09/03/13   left breast,60.4Gy, 32/33 fx completed   . Hyperlipidemia   . Hypertension    "never took my RX" (07/31/2014)  . Jaundice   . Malignant neoplasm of lower-outer quadrant of female breast ([place]) 05/19/2013  . Pain management    goes to [facility] clinic, but she doesn't intend to return   . Personal history of radiation therapy    2014  . Stroke [facility]) 2012   TIA  . TIA (transient ischemic attack) 06/2011   "can't see real well out of left eye since; weaker on left side since"  . Varicose veins   . Walking pneumonia ~ 2008    Patient Active Problem List   Diagnosis Date Noted  . Takotsubo cardiomyopathy 12/25/2017  . Cerebrovascular accident (CVA) ([place])   . 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 Elevated troponin   . NSTEMI (non-ST elevated myocardial infarction) ([place]) 12/23/2017  . Head lice infestation 61/95/0932  . Chest pain 07/31/2014  . 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 Partial small bowel obstruction ([place]) 04/07/2013  . SBO (small bowel obstruction) ([place]) 01/05/2013  . Adjustment disorder with mixed anxiety and depressed mood 01/05/2013  . Chronic pain syndrome 01/05/2013  . Thyroid cyst 01/05/2013  . Tobacco abuse 12/05/2012  .  Hepatitis C   . Jaundice   . Hyperlipidemia   . Varicose veins   . Brain aneurysm   . TIA (transient ischemic attack) 06/12/2011    Past Surgical History:  Procedure Laterality Date  . BREAST BIOPSY Left 03/2013; 05/2013  . BREAST LUMPECTOMY Left 04/02/2013   Procedure: Excision  of Left Breast Mass;  Surgeon: Harl Bowie, MD;  Location: [facility];  Service: General;  Laterality: Left;  . BREAST LUMPECTOMY WITH SENTINEL LYMPH NODE BIOPSY Left 06/05/2013   Procedure: BREAST LUMPECTOMY WITH SENTINEL LYMPH NODE BX;  Surgeon: Harl Bowie, MD;  Location: [facility];  Service: General;  Laterality: Left;  . CARDIAC CATHETERIZATION  12/24/2017  . COLON SURGERY  03/2013   bowel  obstruction , malignant tumor    . DILATION AND CURETTAGE OF UTERUS  ~ 1990   S/P miscarriage  . EVACUATION BREAST HEMATOMA Left 06/05/2013   Procedure: EVACUATION HEMATOMA BREAST;  Surgeon: Harl Bowie, MD;  Location: [facility];  Service: General;  Laterality: Left;  . LAPAROSCOPY N/A 04/02/2013   Procedure: Diaganostic Lap.; Small Bowel Resection;  Surgeon: Harl Bowie, MD;  Location: [facility];  Service: General;  Laterality: N/A;  . LEFT HEART CATH AND CORONARY ANGIOGRAPHY N/A 12/24/2017   Procedure: LEFT HEART CATH AND CORONARY ANGIOGRAPHY;  Surgeon: Jettie Booze, MD;  Location: [facility];  Service: Cardiovascular;  Laterality: N/A;  . TUMOR EXCISION  04/02/2013   "carcoid tumor removed"  . vaginal births   56    birthed twins at -[redacted] weeks gestation - both survived , total of 3 pregnancies - all born vaginally     OB History    Gravida  5   Para  4   Term      Preterm  2   AB  1   Living  4     SAB      TAB  1   Ectopic      Multiple      Live Births               Home Medications    Prior to Admission medications   Medication Sig Start Date End Date Taking? Authorizing Provider  acetaminophen (TYLENOL) 500 MG tablet Take 500 mg by mouth 2 (two) times daily as needed (for back and knee pain).    Yes [provider]  ALPRAZolam Duanne Moron) 1 MG tablet Take 1 tablet (1 mg total) by mouth 2 (two) times daily as needed for anxiety. 06/04/15  Yes Nicholas Lose, MD  aspirin EC 81 MG tablet Take 81 mg by mouth daily.   Yes [provider]  FLUoxetine (PROZAC) 20 MG capsule Take 1 capsule (20 mg total) by mouth daily. Delete prior refills Patient taking differently: Take 20 mg by mouth daily.  02/19/18  Yes Merian Capron, MD  lisinopril (PRINIVIL,ZESTRIL) 10 MG tablet Take 1 tablet (10 mg total) by mouth daily. 01/03/18 04/03/18 Yes Kilroy, Doreene Burke, PA-C  Melatonin 10 MG SUBL Place 1 tablet under the tongue at bedtime as needed  (sleep).   Yes [provider]  metoprolol succinate (TOPROL-XL) 25 MG 24 hr tablet Take 0.5 tablets (12.5 mg total) by mouth daily. 12/25/17  Yes Duke, Tami Lin, PA  NICOTINE STEP 2 14 MG/24HR patch PLACE ONE PATCH ONTO THE SKIN DAILY. 01/15/18  Yes [provider]  nitroGLYCERIN (NITROSTAT) 0.4 MG SL tablet Place 1 tablet (0.4 mg total) under the tongue every  5 (five) minutes x 3 doses as needed for chest pain. 12/25/17  Yes Duke, Tami Lin, PA  Turmeric 500 MG CAPS Take 500 mg by mouth daily.   Yes [provider]  permethrin (ELIMITE) 5 % cream Apply to affected area once Patient not taking: Reported on 03/02/2018 02/20/18   Janith Lima, PA-C    Family History Family History  Problem Relation Age of Onset  . Diabetes Paternal Grandfather   . Diabetes Paternal Grandmother   . Diabetes Mother   . Heart disease Mother   . Cancer Mother        cervical  . Diabetes Brother   . Stroke Brother   . Heart disease Brother   . Heart disease Sister   . Diabetes Sister   . Cancer Sister        cervical  . Arrhythmia Sister   . Cancer Father        lung    Social History Social History   Tobacco Use  . Smoking status: Current Some Day Smoker    Packs/day: 0.50    Years: 36.00    Pack years: 18.00    Types: Cigarettes    Last attempt to quit: 12/22/2017    Years since quitting: 0.1  . Smokeless tobacco: Never Used  . Tobacco comment: smokes some days, use patch some days  Substance Use Topics  . Alcohol use: Yes    Alcohol/week: 1.8 oz    Types: 3 Glasses of wine per week  . Drug use: Yes    Types: Marijuana    Comment: "last marijuana ~ 06/2014     Allergies   Statins; Tramadol; Ace inhibitors; Prozac [fluoxetine]; Ciprofloxacin hcl; and Sertraline hcl   Review of Systems Review of Systems  Ten systems reviewed and are negative for acute change, except as noted in the HPI.   Physical Exam Updated Vital Signs BP (!) 139/98    Pulse (!) 53   Temp 97.8 F (36.6 C) (Oral)   Resp 17   SpO2 95%   Physical Exam  Constitutional: She is oriented to person, place, and time. She appears well-developed and well-nourished. No distress.  HENT:  Head: Normocephalic and atraumatic.  Eyes: Pupils are equal, round, and reactive to light. Conjunctivae and EOM are normal. No scleral icterus.  Neck: Normal range of motion.  Cardiovascular: Normal rate, regular rhythm and normal heart sounds. Exam reveals no gallop and no friction rub.  No murmur heard. Patient intermittently grips her chest with the right hand and grimaces due to pain.  Pulmonary/Chest: Effort normal and breath sounds normal. No respiratory distress.  Abdominal: Soft. Bowel sounds are normal. She exhibits no distension and no mass. There is no tenderness. There is no guarding.  Musculoskeletal:       Right lower leg: She exhibits no edema.       Left lower leg: She exhibits no edema.  Neurological: She is alert and oriented to person, place, and time.  Skin: Skin is warm and dry. She is not diaphoretic.  Psychiatric: Her behavior is normal.  Nursing note and vitals reviewed.    ED Treatments / Results  Labs (all labs ordered are listed, but only abnormal results are displayed) Labs Reviewed  BASIC METABOLIC PANEL  CBC  I-STAT TROPONIN, ED    EKG EKG Interpretation  Date/Time:  Saturday March 02 2018 13:22:41 EDT Ventricular Rate:  58 PR Interval:  126 QRS Duration: 84 QT Interval:  424 QTC Calculation:  416 R Axis:   17 Text Interpretation:  Sinus bradycardia T wave abnormality, consider inferolateral ischemia Abnormal ECG Confirmed by Davonna Belling [phone]) on 03/02/2018 2:44:45 PM   Radiology Dg Chest 2 View  Result Date: 03/02/2018 CLINICAL DATA:  Chest pain radiating to back. EXAM: CHEST - 2 VIEW COMPARISON:  12/23/2017 FINDINGS: The heart size and mediastinal contours are within normal limits. Both lungs are clear. The visualized  skeletal structures are unremarkable. IMPRESSION: No active cardiopulmonary disease. Electronically Signed   By: Marin Olp M.D.   On: 03/02/2018 14:19   Ct Angio Chest/abd/pel For Dissection W And/or Wo Contrast  Result Date: 03/02/2018 CLINICAL DATA:  Recent history of STEMI post LHC showing nonobstructive disease with moderate to severe left ventricular systolic dysfunction now presents with acute chest pain. History of breast cancer. EXAM: CT ANGIOGRAPHY CHEST, ABDOMEN AND PELVIS TECHNIQUE: Multidetector CT imaging through the chest, abdomen and pelvis was performed using the standard protocol during bolus administration of intravenous contrast. Multiplanar reconstructed images and MIPs were obtained and reviewed to evaluate the vascular anatomy. CONTRAST:  193mL ISOVUE-370 IOPAMIDOL (ISOVUE-370) INJECTION 76% COMPARISON:  CT chest 08/30/2016 and CT abdomen/pelvis 06/25/2015 FINDINGS: CTA CHEST FINDINGS Cardiovascular: Heart is normal size. Thoracic aorta is normal caliber without dissection or aneurysm. Normal 3 vessel takeoff from the aortic arch. Pulmonary arterial system is normal. Mediastinum/Nodes: No mediastinal or hilar adenopathy. Remaining mediastinal structures are normal. Subcentimeter hypodensity over the left lobe of the thyroid unchanged. Lungs/Pleura: Lungs are adequately inflated with mild paraseptal and centrilobular emphysema over the upper lung/apices. No focal lobar consolidation or effusion. Airways are normal. Musculoskeletal: Minimal degenerate change of the spine. Review of the MIP images confirms the above findings. CTA ABDOMEN AND PELVIS FINDINGS VASCULAR Aorta: Minimal calcified plaque. No evidence of aneurysm or dissection. Celiac: Patent. SMA: Patent. Renals: Patent. IMA: Patent. Inflow: Minimal calcified plaque but patent. Veins: Unremarkable. Review of the MIP images confirms the above findings. NON-VASCULAR Hepatobiliary: Normal. Pancreas: Normal. Spleen: Normal.  Adrenals/Urinary Tract: Adrenal glands are normal. Kidneys normal size without hydronephrosis or nephrolithiasis. Ureters and bladder are normal. Stomach/Bowel: Stomach and small bowel are within normal. Suture line over small bowel loop in the right lower quadrant. Appendix is normal. Colon is normal. Lymphatic: No adenopathy. Reproductive: Normal. Other: No free fluid or focal inflammatory change. Musculoskeletal: Minimal degenerate change of the spine. Review of the MIP images confirms the above findings. IMPRESSION: No evidence of aneurysm or dissection involving the thoracoabdominal aorta. Aortic Atherosclerosis (ICD10-I70.0). No acute findings in the chest, abdomen or pelvis. Emphysema (ICD10-J43.9). Stable subcentimeter left thyroid hypodense nodule. Electronically Signed   By: Marin Olp M.D.   On: 03/02/2018 16:16    Procedures Procedures (including critical care time)  Medications Ordered in ED Medications  fentaNYL (SUBLIMAZE) injection 50 mcg (50 mcg Intravenous Given 03/02/18 1504)  ondansetron (ZOFRAN) injection 4 mg (4 mg Intravenous Given 03/02/18 1504)  iopamidol (ISOVUE-370) 76 % injection 100 mL (100 mLs Intravenous Contrast Given 03/02/18 1529)     Initial Impression / Assessment and Plan / ED Course  I have reviewed the triage vital signs and the nursing notes.  Pertinent labs & imaging results that were available during my care of the patient were reviewed by me and considered in my medical decision making (see chart for details).  Clinical Course as of Mar 03 1623  Sat Mar 02, 2018  1620 Patient CT negative for any acute abnormality.  Plan to delta trop at 5:00 PM. I have  given sign out to Dr. Berline Lopes who will assume care of the patient. Suspect discharge if negative.   [AH]    Clinical Course User Index [AH] Margarita Mail, PA-C      Final Clinical Impressions(s) / ED Diagnoses   Final diagnoses:  None    ED Discharge Orders    None       Margarita Mail, PA-C 03/02/18 1624    Davonna Belling, MD 03/02/18 1635

## 2018-03-02 NOTE — ED Provider Notes (Addendum)
[facility]    CSN: [id number] Arrival date & time: 03/02/18  1239     History   Chief Complaint Chief Complaint  Patient presents with  . Chest Pain    HPI [name] is a 62 y.o. female.   Subjective:  [name] is a 62 y.o. female who presents for evaluation of chest pain.  Initial onset was around 3 AM this morning.  The pain woke the patient up from her sleep.  She was able to go back to sleep without any specific interventions.  She was awakened again around 6 AM with the same pain.  She took 1 sublingual nitroglycerin which has provided some relief but has not totally resolved the pain.  The patient describes the pain as heaviness that now radiates to her back. Patient rates pain as a 6/10 in intensity. Associated symptoms are: dyspnea and fatigue. Aggravating factors are: coughing and recent emotional stress. Alleviating factors are: nitroglycerin tablets. Patient's cardiac risk factors are: hypertension, smoking/ tobacco exposure, hyperlipidemia, TIA. Patient's risk factors for DVT/PE: history of malignancy (breast CA s/p chemo and radiation). Previous cardiac testing: echocardiogram, electrocardiogram (ECG) and left heart cath.  Notably, the patient was hospitalized back in April 2019 for NSTEMI. LHC at that time showed nonobstructive disease but moderate to severe left ventricular systolic dysfunction in the pattern of Takotsubo cardiomyopathy. LVEF 15% with inferolateral wall abnormality. Medical therapy was recommended. Patient reports compliance with prescribed regimen but admits to continued tobacco use.   The following portions of the patient's history were reviewed and updated as appropriate: allergies, current medications, past family history, past medical history, past social history, past surgical history and problem list.       Past Medical History:  Diagnosis Date  . Adjustment disorder with mixed anxiety and depressed mood 01/05/2013  .  Allergy   . Anxiety   . Arthritis    "knees,wrists" (07/31/2014)  . Brain aneurysm 2012   2cms.  . Breast cancer ([place]) 06/05/2013  . Breast disorder    lump in left breast  . Carcinoid tumor   . Complication of anesthesia    hallucinations , anxiety   . Depression   . H/O echocardiogram    not finding report in EPIC, pt. reports that it was before her appt. /w Dr. Johnsie Cancel   . Hepatitis B dx'd ~ 1980  . Hepatitis C dx'd ~ 2000   "specialist says it cleared itself on it's own"  . Hx of radiation therapy 07/17/13-09/03/13   left breast,60.4Gy, 32/33 fx completed  . Hyperlipidemia   . Hypertension    "never took my RX" (07/31/2014)  . Jaundice   . Malignant neoplasm of lower-outer quadrant of female breast ([place]) 05/19/2013  . Pain management    goes to [facility] clinic, but she doesn't intend to return   . Personal history of radiation therapy    2014  . Stroke [facility]) 2012   TIA  . TIA (transient ischemic attack) 06/2011   "can't see real well out of left eye since; weaker on left side since"  . Varicose veins   . Walking pneumonia ~ 2008    Patient Active Problem List   Diagnosis Date Noted  . Takotsubo cardiomyopathy 12/25/2017  . Cerebrovascular accident (CVA) ([place])   . 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 Elevated troponin   . NSTEMI (non-ST elevated myocardial infarction) ([place]) 12/23/2017  . Head lice infestation 54/65/0354  . Chest  pain 07/31/2014  . 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 Partial small bowel obstruction ([place]) 04/07/2013  . SBO (small bowel obstruction) ([place]) 01/05/2013  . Adjustment disorder with mixed anxiety and depressed mood 01/05/2013  . Chronic pain syndrome 01/05/2013  . Thyroid cyst 01/05/2013  . Tobacco abuse 12/05/2012  . Hepatitis C   . Jaundice   . Hyperlipidemia   . Varicose veins   . Brain aneurysm   . TIA (transient ischemic  attack) 06/12/2011    Past Surgical History:  Procedure Laterality Date  . BREAST BIOPSY Left 03/2013; 05/2013  . BREAST LUMPECTOMY Left 04/02/2013   Procedure: Excision  of Left Breast Mass;  Surgeon: Harl Bowie, MD;  Location: [facility];  Service: General;  Laterality: Left;  . BREAST LUMPECTOMY WITH SENTINEL LYMPH NODE BIOPSY Left 06/05/2013   Procedure: BREAST LUMPECTOMY WITH SENTINEL LYMPH NODE BX;  Surgeon: Harl Bowie, MD;  Location: [facility];  Service: General;  Laterality: Left;  . CARDIAC CATHETERIZATION  12/24/2017  . COLON SURGERY  03/2013   bowel obstruction , malignant tumor    . DILATION AND CURETTAGE OF UTERUS  ~ 1990   S/P miscarriage  . EVACUATION BREAST HEMATOMA Left 06/05/2013   Procedure: EVACUATION HEMATOMA BREAST;  Surgeon: Harl Bowie, MD;  Location: [facility];  Service: General;  Laterality: Left;  . LAPAROSCOPY N/A 04/02/2013   Procedure: Diaganostic Lap.; Small Bowel Resection;  Surgeon: Harl Bowie, MD;  Location: [facility];  Service: General;  Laterality: N/A;  . LEFT HEART CATH AND CORONARY ANGIOGRAPHY N/A 12/24/2017   Procedure: LEFT HEART CATH AND CORONARY ANGIOGRAPHY;  Surgeon: Jettie Booze, MD;  Location: [facility] CV [facility];  Service: Cardiovascular;  Laterality: N/A;  . TUMOR EXCISION  04/02/2013   "carcoid tumor removed"  . vaginal births   58    birthed twins at -[redacted] weeks gestation - both survived , total of 3 pregnancies - all born vaginally    OB History    Gravida  5   Para  4   Term      Preterm  2   AB  1   Living  4     SAB      TAB  1   Ectopic      Multiple      Live Births               Home Medications    Prior to Admission medications   Medication Sig Start Date End Date Taking? Authorizing Provider  acetaminophen (TYLENOL) 500 MG tablet Take 1,500 mg by mouth daily as needed (back pain).    [provider]  ALPRAZolam Duanne Moron) 1 MG tablet Take 1 tablet (1 mg total) by mouth 2 (two)  times daily as needed for anxiety. 06/04/15   Nicholas Lose, MD  aspirin EC 81 MG tablet Take 81 mg by mouth daily.    [provider]  FLUoxetine (PROZAC) 20 MG capsule Take 1 capsule (20 mg total) by mouth daily. Delete prior refills 02/19/18   Merian Capron, MD  lisinopril (PRINIVIL,ZESTRIL) 10 MG tablet Take 1 tablet (10 mg total) by mouth daily. 01/03/18 04/03/18  Erlene Quan, PA-C  Melatonin 10 MG SUBL Place 1 tablet under the tongue at bedtime as needed (sleep).    [provider]  metoprolol succinate (TOPROL-XL) 25 MG 24 hr tablet Take 0.5 tablets (12.5  mg total) by mouth daily. 12/25/17   Duke, Tami Lin, PA  nitroGLYCERIN (NITROSTAT) 0.4 MG SL tablet Place 1 tablet (0.4 mg total) under the tongue every 5 (five) minutes x 3 doses as needed for chest pain. 12/25/17   Duke, Tami Lin, PA  permethrin (ELIMITE) 5 % cream Apply to affected area once 02/20/18   Wieters, Elesa Hacker, PA-C    Family History Family History  Problem Relation Age of Onset  . Diabetes Paternal Grandfather   . Diabetes Paternal Grandmother   . Diabetes Mother   . Heart disease Mother   . Cancer Mother        cervical  . Diabetes Brother   . Stroke Brother   . Heart disease Brother   . Heart disease Sister   . Diabetes Sister   . Cancer Sister        cervical  . Arrhythmia Sister   . Cancer Father        lung    Social History Social History   Tobacco Use  . Smoking status: Current Some Day Smoker    Packs/day: 0.50    Years: 36.00    Pack years: 18.00    Types: Cigarettes    Last attempt to quit: 12/22/2017    Years since quitting: 0.1  . Smokeless tobacco: Never Used  . Tobacco comment: smokes some days, use patch some days  Substance Use Topics  . Alcohol use: Yes    Alcohol/week: 1.8 oz    Types: 3 Glasses of wine per week  . Drug use: Yes    Types: Marijuana    Comment: "last marijuana ~ 06/2014     Allergies   Statins; Ace inhibitors; Sertraline hcl;  Ciprofloxacin hcl; and Tramadol   Review of Systems Review of Systems  Constitutional: Negative for diaphoresis.  Respiratory: Positive for shortness of breath.   Cardiovascular: Positive for chest pain.  Gastrointestinal: Negative for nausea and vomiting.  Neurological: Negative for dizziness and headaches.     Physical Exam Triage Vital Signs ED Triage Vitals [03/02/18 1251]  Enc Vitals Group     BP 115/71     Pulse Rate 62     Resp 18     Temp      Temp src      SpO2 98 %     Weight      Height      Head Circumference      Peak Flow      Pain Score 7     Pain Loc      Pain Edu?      Excl. in [place]?    No data found.  Updated Vital Signs BP 115/71   Pulse 62   Resp 18   SpO2 98%   Visual Acuity Right Eye Distance:   Left Eye Distance:   Bilateral Distance:    Right Eye Near:   Left Eye Near:    Bilateral Near:     Physical Exam  Constitutional: She is oriented to person, place, and time. She appears well-developed and well-nourished. No distress.  Neck: Normal range of motion. Neck supple.  Cardiovascular: Regular rhythm.  Pulmonary/Chest: Effort normal and breath sounds normal. No respiratory distress.  Musculoskeletal: Normal range of motion.  Neurological: She is alert and oriented to person, place, and time.  Skin: Skin is warm and dry.  Psychiatric: She has a normal mood and affect.     UC Treatments / Results  Labs (all  labs ordered are listed, but only abnormal results are displayed) Labs Reviewed - No data to display  EKG None  Radiology No results found.  Procedures Procedures (including critical care time)  Medications Ordered in UC Medications - No data to display  Initial Impression / Assessment and Plan / UC Course  I have reviewed the triage vital signs and the nursing notes.  Pertinent labs & imaging results that were available during my care of the patient were reviewed by me and considered in my medical decision making  (see chart for details).     62 year old female with a recent history of an STEMI status post LHC showing nonobstructive disease with moderate to severe left ventricular systolic dysfunction now presenting with acute chest pain.  Some relief with nitroglycerin.  Patient is alert and oriented x3.  Vital signs stable on room air.  His goal exam unremarkable.  EKG shows sinus bradycardia with sinus arrhythmia and nonspecific T wave abnormalities.  Due to patient's presenting complaints and cardiac history, patient refer to emergency department via private vehicle for further work-up and cardiology consultation.  Discussed diagnosis with patient. Patient comfortable with plan and disposition.  Patient has a clear mental status at this time, good insight into illness (after discussion and teaching) and has clear judgment to make decisions regarding their care.  Documentation was completed with the aid of voice recognition software. Transcription may contain typographical errors. Final Clinical Impressions(s) / UC Diagnoses   Final diagnoses:  Chest pain, unspecified type   Discharge Instructions   None    ED Prescriptions    None     Controlled Substance Prescriptions [place] Controlled Substance Registry consulted? Not Applicable   Enrique Sack, [place] 03/02/18 [place]    Enrique Sack, [place] 03/02/18 1317

## 2018-03-02 NOTE — ED Notes (Signed)
Bed: UCTR Expected date:  Expected time:  Means of arrival:  Comments: 

## 2018-03-02 NOTE — ED Triage Notes (Signed)
Pt presents with complaints of chest pain. Hx of recent heart attack and feeling the same.  ekg completed in triage. Samantha NP at bedside assessing patient.

## 2018-03-12 ENCOUNTER — Ambulatory Visit (HOSPITAL_COMMUNITY): Payer: Medicaid Other | Attending: Cardiovascular Disease

## 2018-03-12 ENCOUNTER — Other Ambulatory Visit: Payer: Medicaid Other | Admitting: *Deleted

## 2018-03-12 ENCOUNTER — Other Ambulatory Visit: Payer: Self-pay | Admitting: Cardiology

## 2018-03-12 ENCOUNTER — Other Ambulatory Visit: Payer: Self-pay

## 2018-03-12 DIAGNOSIS — G459 Transient cerebral ischemic attack, unspecified: Secondary | ICD-10-CM | POA: Diagnosis not present

## 2018-03-12 DIAGNOSIS — I5181 Takotsubo syndrome: Secondary | ICD-10-CM | POA: Diagnosis not present

## 2018-03-12 DIAGNOSIS — C50919 Malignant neoplasm of unspecified site of unspecified female breast: Secondary | ICD-10-CM | POA: Diagnosis not present

## 2018-03-12 DIAGNOSIS — E785 Hyperlipidemia, unspecified: Secondary | ICD-10-CM | POA: Diagnosis not present

## 2018-03-12 DIAGNOSIS — I429 Cardiomyopathy, unspecified: Secondary | ICD-10-CM | POA: Insufficient documentation

## 2018-03-12 DIAGNOSIS — Z923 Personal history of irradiation: Secondary | ICD-10-CM | POA: Insufficient documentation

## 2018-03-12 DIAGNOSIS — I351 Nonrheumatic aortic (valve) insufficiency: Secondary | ICD-10-CM | POA: Diagnosis not present

## 2018-03-12 DIAGNOSIS — I10 Essential (primary) hypertension: Secondary | ICD-10-CM | POA: Insufficient documentation

## 2018-03-12 LAB — BASIC METABOLIC PANEL
BUN/Creatinine Ratio: 15 (ref 12–28)
BUN: 12 mg/dL (ref 8–27)
CO2: 23 mmol/L (ref 20–29)
Calcium: 9.2 mg/dL (ref 8.7–10.3)
Chloride: 106 mmol/L (ref 96–106)
Creatinine, Ser: 0.78 mg/dL (ref 0.57–1.00)
GFR calc Af Amer: 95 mL/min/{1.73_m2} (ref >59)
GFR calc non Af Amer: 82 mL/min/{1.73_m2} (ref >59)
Glucose: 92 mg/dL (ref 65–99)
Potassium: 4.3 mmol/L (ref 3.5–5.2)
Sodium: 141 mmol/L (ref 134–144)

## 2018-03-27 ENCOUNTER — Encounter: Payer: Self-pay | Admitting: Cardiovascular Disease

## 2018-03-27 ENCOUNTER — Ambulatory Visit: Payer: Medicaid Other | Admitting: Cardiovascular Disease

## 2018-03-27 VITALS — BP 110/74 | HR 65 | Ht 66.0 in | Wt 167.6 lb

## 2018-03-27 DIAGNOSIS — I5181 Takotsubo syndrome: Secondary | ICD-10-CM

## 2018-03-27 DIAGNOSIS — Z720 Tobacco use: Secondary | ICD-10-CM | POA: Diagnosis not present

## 2018-03-27 DIAGNOSIS — Z8673 Personal history of transient ischemic attack (TIA), and cerebral infarction without residual deficits: Secondary | ICD-10-CM

## 2018-03-27 MED ORDER — NICOTINE 7 MG/24HR TD PT24: 7.0000 mg | MEDICATED_PATCH | Freq: Every day | TRANSDERMAL | 0 refills | Status: DC

## 2018-03-27 MED ORDER — NICOTINE 14 MG/24HR TD PT24: 14.0000 mg | MEDICATED_PATCH | Freq: Every day | TRANSDERMAL | 0 refills | Status: DC

## 2018-03-27 MED ORDER — LOSARTAN POTASSIUM 25 MG PO TABS: 25.0000 mg | ORAL_TABLET | Freq: Every day | ORAL | 3 refills | Status: DC

## 2018-03-27 NOTE — Patient Instructions (Addendum)
Medication Instructions:  STOP LISINOPRIL   START LOSARTAN 25 MG DAILY   START NICOTINE 14 MG PATCH APPLY DAILY FOR 4 WEEKS WHEN YOU COMPLETE THE 4 WEEKS OF THE 14 MG START THE 7 MG DAILY FOR 2 WEEKS   Labwork: NONE  Testing/Procedures: NONE  Follow-Up: Your physician recommends that you schedule a follow-up appointment in: 3 MONTHS   If you need a refill on your cardiac medications before your next appointment, please call your pharmacy.

## 2018-03-27 NOTE — Progress Notes (Signed)
Cardiology Office Note   Date:  03/27/2018   ID:  KAM KUSHNIR, DOB 05/20/1956, MRN [id number]  PCP:  Chesley Noon, MD  Cardiologist:   Skeet Latch, MD   No chief complaint on file.    History of Present Illness: [name] is a 62 y.o. female  with Takatsubo cardiomyopathy, hypertension, hyperlipidemia, prior stroke, ongoing tobacco abuse, and breast cancer status post XRT.    She was admitted 12/2017 with chest pain and was found to have Takotsubo cardiomyopathy.  Echo that admission revealed LVEF 50 to 55% with grade 1 diastolic dysfunction and hypokinesis of the mid to apical inferolateral and inferior myocardium.  She underwent left heart catheterization 12/24/2017 that revealed 10% mid RCA stenosis and LVEF was 25 to 35% by left ventriculography.  She was started on metoprolol and no ACE inhibitor/ARB due to low blood pressure.  She followed up with Kerin Ransom, PA-C and was doing well.  She was started on lisinopril.  She reports intermittent cough since that time.  She had one episode of chest pain 02/2018 that occurred when lying in bed and awakened her from sleep.  At the time she was taking care of her grandchildren who had lice and was very stressed.  She went to the ED Where EKG and troponin were unremarkable and she was discharged home.  She is otherwise been feeling well.  She has not had any chest pain or shortness of breath.  She continues to walk for exercise regularly and has no exertional symptoms.  For the most part she has no edema but does get edema after eating a salty meal.  She denies orthopnea or PND.  She is working on cutting back her cigarette smoking.  She has been using patches intermittently and notes that they seem to help.  She is smoking less than 1 pack of cigarettes daily.   Past Medical History:  Diagnosis Date  . Adjustment disorder with mixed anxiety and depressed mood 01/05/2013  . Allergy   . Anxiety   . Arthritis    "knees,wrists"  (07/31/2014)  . Brain aneurysm 2012   2cms.  . Breast cancer ([place]) 06/05/2013  . Breast disorder    lump in left breast  . Carcinoid tumor   . Complication of anesthesia    hallucinations , anxiety   . Depression   . H/O echocardiogram    not finding report in EPIC, pt. reports that it was before her appt. /w Dr. Johnsie Cancel   . Hepatitis B dx'd ~ 1980  . Hepatitis C dx'd ~ 2000   "specialist says it cleared itself on it's own"  . Hx of radiation therapy 07/17/13-09/03/13   left breast,60.4Gy, 32/33 fx completed  . Hyperlipidemia   . Hypertension    "never took my RX" (07/31/2014)  . Jaundice   . Malignant neoplasm of lower-outer quadrant of female breast ([place]) 05/19/2013  . Pain management    goes to [facility] clinic, but she doesn't intend to return   . Personal history of radiation therapy    2014  . Stroke [facility]) 2012   TIA  . TIA (transient ischemic attack) 06/2011   "can't see real well out of left eye since; weaker on left side since"  . Varicose veins   . Walking pneumonia ~ 2008    Past Surgical History:  Procedure Laterality Date  . BREAST BIOPSY Left 03/2013; 05/2013  . BREAST LUMPECTOMY Left 04/02/2013   Procedure: Excision  of  Left Breast Mass;  Surgeon: Harl Bowie, MD;  Location: [facility];  Service: General;  Laterality: Left;  . BREAST LUMPECTOMY WITH SENTINEL LYMPH NODE BIOPSY Left 06/05/2013   Procedure: BREAST LUMPECTOMY WITH SENTINEL LYMPH NODE BX;  Surgeon: Harl Bowie, MD;  Location: [facility] [place];  Service: General;  Laterality: Left;  . CARDIAC CATHETERIZATION  12/24/2017  . COLON SURGERY  03/2013   bowel obstruction , malignant tumor    . DILATION AND CURETTAGE OF UTERUS  ~ 1990   S/P miscarriage  . EVACUATION BREAST HEMATOMA Left 06/05/2013   Procedure: EVACUATION HEMATOMA BREAST;  Surgeon: Harl Bowie, MD;  Location: [facility];  Service: General;  Laterality: Left;  . LAPAROSCOPY N/A 04/02/2013   Procedure: Diaganostic Lap.; Small Bowel Resection;   Surgeon: Harl Bowie, MD;  Location: [place];  Service: General;  Laterality: N/A;  . LEFT HEART CATH AND CORONARY ANGIOGRAPHY N/A 12/24/2017   Procedure: LEFT HEART CATH AND CORONARY ANGIOGRAPHY;  Surgeon: Jettie Booze, MD;  Location: [place] CV LAB;  Service: Cardiovascular;  Laterality: N/A;  . TUMOR EXCISION  04/02/2013   "carcoid tumor removed"  . vaginal births   55    birthed twins at -[redacted] weeks gestation - both survived , total of 3 pregnancies - all born vaginally     Current Outpatient Medications  Medication Sig Dispense Refill  . acetaminophen (TYLENOL) 500 MG tablet Take 500 mg by mouth 2 (two) times daily as needed (for back and knee pain).     Marland Kitchen ALPRAZolam (XANAX) 1 MG tablet Take 1 tablet (1 mg total) by mouth 2 (two) times daily as needed for anxiety. 45 tablet 0  . aspirin EC 81 MG tablet Take 81 mg by mouth daily.    . Melatonin 10 MG SUBL Place 1 tablet under the tongue at bedtime as needed (sleep).    . metoprolol succinate (TOPROL-XL) 25 MG 24 hr tablet Take 0.5 tablets (12.5 mg total) by mouth daily. 90 tablet 3  . nitroGLYCERIN (NITROSTAT) 0.4 MG SL tablet Place 1 tablet (0.4 mg total) under the tongue every 5 (five) minutes x 3 doses as needed for chest pain. 25 tablet 1  . Turmeric 500 MG CAPS Take 500 mg by mouth daily.    Marland Kitchen losartan (COZAAR) 25 MG tablet Take 1 tablet (25 mg total) by mouth daily. 90 tablet 3  . nicotine (NICODERM CQ) 14 mg/24hr patch Place 1 patch (14 mg total) onto the skin daily. 28 patch 0  . nicotine (NICODERM CQ) 7 mg/24hr patch Place 1 patch (7 mg total) onto the skin daily. FOR 2 WEEKS. TO BE USED ONCE YOU COMPLETE THE 1 MONTH TREATMENT 14 patch 0   No current facility-administered medications for this visit.     Allergies:   Statins; Tramadol; Ace inhibitors; Lisinopril; Prozac [fluoxetine]; Ciprofloxacin hcl; and Sertraline hcl    Social History:  The patient  reports that she has been smoking cigarettes.  She has a  18.00 pack-year smoking history. She has never used smokeless tobacco. She reports that she drinks about 1.8 oz of alcohol per week. She reports that she has current or past drug history. Drug: Marijuana.   Family History:  The patient's family history includes Arrhythmia in her sister; Cancer in her father, mother, and sister; Diabetes in her brother, mother, paternal grandfather, paternal grandmother, and sister; Heart disease in her brother, mother, and sister; Stroke in her brother.    ROS:  Please see  the history of present illness.   Otherwise, review of systems are positive for none.   All other systems are reviewed and negative.    PHYSICAL EXAM: VS:  BP 110/74   Pulse 65   Ht 5\' 6"  (1.676 m)   Wt 167 lb 9.6 oz (76 kg)   SpO2 98%   BMI 27.05 kg/m  , BMI Body mass index is 27.05 kg/m. GENERAL:  Well appearing HEENT:  Pupils equal round and reactive, fundi not visualized, oral mucosa unremarkable NECK:  No jugular venous distention, waveform within normal limits, carotid upstroke brisk and symmetric, no bruits LUNGS:  Clear to auscultation bilaterally HEART:  RRR.  PMI not displaced or sustained,S1 and S2 within normal limits, no S3, no S4, no clicks, no rubs, no murmurs ABD:  Flat, positive bowel sounds normal in frequency in pitch, no bruits, no rebound, no guarding, no midline pulsatile mass, no hepatomegaly, no splenomegaly EXT:  2 plus pulses throughout, no edema, no cyanosis no clubbing SKIN:  No rashes no nodules NEURO:  Cranial nerves II through XII grossly intact, motor grossly intact throughout PSYCH:  Cognitively intact, oriented to person place and time   EKG:  EKG is not ordered today.   Recent Labs: 12/24/2017: Magnesium 1.9; TSH 1.333 02/15/2018: ALT 11 03/02/2018: Hemoglobin 14.3; Platelets 231 03/12/2018: BUN 12; Creatinine, Ser 0.78; Potassium 4.3; Sodium 141   Echo 12/24/17: Study Conclusions - Left ventricle: Longitudinal global LV strain is  borderlin abnormal at -15% with the abnormality primarly in the inferolateral wall. The cavity size was normal. There was mild focal basal hypertrophy of the septum. Systolic function was normal. The estimated ejection fraction was in the range of 50% to 55%. There is hypokinesis of the mid-apicalinferolateral and inferior myocardium. There was an increased relative contribution of atrial contraction to ventricular filling. Doppler parameters are consistent with abnormal left ventricular relaxation (grade 1 diastolic dysfunction). - Aortic valve: Mildly to moderately calcified annulus. Trileaflet; normal thickness leaflets. There was trivial regurgitation. - Mitral valve: Calcified annulus. - Pulmonic valve: There was trivial regurgitation.  Impressions: - Compared to prior echo, inferior and inferolateral wall motion abnormalities are new.   LHC 12/24/17:  Mid RCA lesion is 10% stenosed.  LV end diastolic pressure is mildly elevated.  There is moderate to severe left ventricular systolic dysfunction in the pattern of Takotsubo cardiomyopathy  The left ventricular ejection fraction is 25-35% by visual estimate.  There is no aortic valve stenosis.    Lipid Panel    Component Value Date/Time   CHOL 200 12/24/2017 0953   TRIG 131 12/24/2017 0953   HDL 76 12/24/2017 0953   CHOLHDL 2.6 12/24/2017 0953   VLDL 26 12/24/2017 0953   LDLCALC 98 12/24/2017 0953      Wt Readings from Last 3 Encounters:  03/27/18 167 lb 9.6 oz (76 kg)  02/15/18 168 lb 11.2 oz (76.5 kg)  01/03/18 170 lb (77.1 kg)      ASSESSMENT AND PLAN:  # Takotsubo cardiomyopathy: Resolved.  We will switch lisinopril to losartan 25 mg daily due to cough.  Continue metoprolol succinate.  If she continues to do well at her next appointment we will stop losartan and then stop metoprolol 6 months after that.  # Tobacco abuse:  She was congratulated on cutting back her smoking.  Will  provide nicotine patches 14mg  daily x30 days then 7 mg x2 weeks.  Smoking cessation discussed for 3 minutes.   # Prior stroke: Continue aspirin.  Current medicines are reviewed at length with the patient today.  The patient does not have concerns regarding medicines.  The following changes have been made:  Switch lisinopril to losartan.   Labs/ tests ordered today include:  No orders of the defined types were placed in this encounter.    Disposition:   FU with Bosco Paparella C. Oval Linsey, MD, [facility] in 3 months.      Signed, Jan Walters C. Oval Linsey, MD, [facility]  03/27/2018 10:28 AM    [facility]

## 2018-04-05 ENCOUNTER — Other Ambulatory Visit: Payer: Self-pay | Admitting: Cardiology

## 2018-04-05 NOTE — Telephone Encounter (Signed)
Rx request sent to pharmacy.  

## 2018-04-26 ENCOUNTER — Emergency Department (HOSPITAL_COMMUNITY): Payer: Medicaid Other

## 2018-04-26 ENCOUNTER — Emergency Department (HOSPITAL_COMMUNITY)
Admission: EM | Admit: 2018-04-26 | Discharge: 2018-04-26 | Discharge status: Against Medical Advice | Payer: Medicaid Other | Attending: Emergency Medicine | Admitting: Emergency Medicine

## 2018-04-26 ENCOUNTER — Encounter (HOSPITAL_COMMUNITY): Payer: Self-pay | Admitting: Emergency Medicine

## 2018-04-26 ENCOUNTER — Other Ambulatory Visit: Payer: Self-pay

## 2018-04-26 ENCOUNTER — Ambulatory Visit (HOSPITAL_COMMUNITY)
Admission: EM | Admit: 2018-04-26 | Discharge: 2018-04-26 | Disposition: A | Discharge status: Home / Self Care | Payer: Medicaid Other

## 2018-04-26 ENCOUNTER — Encounter (HOSPITAL_COMMUNITY): Payer: Self-pay

## 2018-04-26 DIAGNOSIS — Z85028 Personal history of other malignant neoplasm of stomach: Secondary | ICD-10-CM | POA: Diagnosis not present

## 2018-04-26 DIAGNOSIS — R197 Diarrhea, unspecified: Secondary | ICD-10-CM | POA: Insufficient documentation

## 2018-04-26 DIAGNOSIS — R1084 Generalized abdominal pain: Secondary | ICD-10-CM | POA: Diagnosis present

## 2018-04-26 DIAGNOSIS — Z5321 Procedure and treatment not carried out due to patient leaving prior to being seen by health care provider: Secondary | ICD-10-CM | POA: Diagnosis not present

## 2018-04-26 DIAGNOSIS — R1012 Left upper quadrant pain: Secondary | ICD-10-CM | POA: Diagnosis not present

## 2018-04-26 DIAGNOSIS — R1110 Vomiting, unspecified: Secondary | ICD-10-CM | POA: Insufficient documentation

## 2018-04-26 LAB — COMPREHENSIVE METABOLIC PANEL
ALBUMIN: 3.7 g/dL (ref 3.5–5.0)
ALT: 21 U/L (ref 0–44)
ANION GAP: 9 (ref 5–15)
AST: 19 U/L (ref 15–41)
Alkaline Phosphatase: 61 U/L (ref 38–126)
BUN: 11 mg/dL (ref 8–23)
CO2: 22 mmol/L (ref 22–32)
Calcium: 9.3 mg/dL (ref 8.9–10.3)
Chloride: 110 mmol/L (ref 98–111)
Creatinine, Ser: 0.68 mg/dL (ref 0.44–1.00)
GFR calc non Af Amer: >60 mL/min (ref >60)
GLUCOSE: 93 mg/dL (ref 70–99)
Potassium: 4.2 mmol/L (ref 3.5–5.1)
SODIUM: 141 mmol/L (ref 135–145)
Total Bilirubin: 0.7 mg/dL (ref 0.3–1.2)
Total Protein: 6.7 g/dL (ref 6.5–8.1)

## 2018-04-26 LAB — CBC
HCT: 41.8 % (ref 36.0–46.0)
HEMOGLOBIN: 13.1 g/dL (ref 12.0–15.0)
MCH: 28.2 pg (ref 26.0–34.0)
MCHC: 31.3 g/dL (ref 30.0–36.0)
MCV: 90.1 fL (ref 78.0–100.0)
Platelets: 240 10*3/uL (ref 150–400)
RBC: 4.64 MIL/uL (ref 3.87–5.11)
RDW: 13.7 % (ref 11.5–15.5)
WBC: 8.1 10*3/uL (ref 4.0–10.5)

## 2018-04-26 LAB — LIPASE, BLOOD: Lipase: 20 U/L (ref 11–51)

## 2018-04-26 MED ORDER — FENTANYL CITRATE (PF) 100 MCG/2ML IJ SOLN
100.0000 ug | Freq: Once | INTRAMUSCULAR | Status: AC
  Administered 2018-04-26: 100 ug via INTRAVENOUS
  Filled 2018-04-26: qty 2

## 2018-04-26 MED ORDER — ONDANSETRON HCL 4 MG/2ML IJ SOLN
4.0000 mg | Freq: Once | INTRAMUSCULAR | Status: AC
  Administered 2018-04-26: 4 mg via INTRAVENOUS
  Filled 2018-04-26: qty 2

## 2018-04-26 MED ORDER — IOHEXOL 300 MG/ML  SOLN: 100.0000 mL | Freq: Once | INTRAMUSCULAR | Status: DC | PRN

## 2018-04-26 NOTE — ED Triage Notes (Signed)
Pt has hx of stomach cancer.

## 2018-04-26 NOTE — ED Provider Notes (Signed)
Patient placed in Quick Look pathway, seen and evaluated   Chief Complaint: Abdominal pain, vomiting, diarrhea  HPI:   Patient with history of carcinoid tumor status post resection presents the emergency department with worsening abdominal pain, worse in the left side of the abdomen, bloating and abdominal distention, associated vomiting and diarrhea with black specks noted in the stool over the past 1 week.  Patient was seen at urgent care and transferred to the emergency department.  No urinary symptoms.  ROS:  Positive ROS: (+) Abdominal pain, vomiting, diarrhea Negative ROS: (-) Fever  Physical Exam:   Gen: No distress  Neuro: Awake and Alert  Skin: Warm    Focused Exam: Heart RRR, nml S1,S2, no m/r/g; Lungs CTAB; Abd soft, moderate tenderness left upper abdomen and left lateral abdomen, no rebound or guarding, healed surgical scar noted inferior to the umbilicus; Ext 2+ pedal pulses bilaterally, no edema.  BP 120/79 (BP Location: Right Arm)   Pulse 70   Temp 98.4 F (36.9 C) (Oral)   Resp 20   SpO2 97%   Plan: Lab work, CT scan given surgical history and extent of discomfort.  Initiation of care has begun. The patient has been counseled on the process, plan, and necessity for staying for the completion/evaluation, and the remainder of the medical screening examination    [place] Cater, PA-C 04/26/18 [place], Wenda Overland, MD 05/07/18 1606

## 2018-04-26 NOTE — ED Triage Notes (Signed)
Pt c/o generalized abdominal pain x1 week, has appt with gastroenterologist on Monday. Pt c/o generalized abdominal pain and bloating, states sometimes it hurts in the LUQ. Pt also c/o nausea.

## 2018-04-26 NOTE — ED Provider Notes (Signed)
[place]   [id number] 04/26/18 Arrival Time: 1246  CC: ABDOMINAL DISCOMFORT  SUBJECTIVE:  [name] is a 62 y.o. female hx significant for takotsubo cardiomyopathy, HLD, breast and carcinoid cancer, stroke, TIA, brain aneurysm, HTN, Hep B who presents with complaint of abdominal pain that began 2 weeks ago that progressively got worse within the last day.  Denies a precipitating event, trauma, or known trigger.  Localizes pain to LUQ.  Describes as constant and 9/10. Tried Gas-X without relief.  Last BM this morning.  Reports associated fatigue, nausea, vomiting x1 episodes, abdominal bloating, looser stools with "dark specs"  Denies fever, chills, appetite changes, weight changes, chest pain, SOB, diarrhea, constipation, hematochezia, melena, dysuria, difficulty urinating, increased frequency or urgency, flank pain, loss of bowel or bladder function.  No LMP recorded. Patient is postmenopausal.  ROS: As per HPI.  Past Medical History:  Diagnosis Date  . Adjustment disorder with mixed anxiety and depressed mood 01/05/2013  . Allergy   . Anxiety   . Arthritis    "knees,wrists" (07/31/2014)  . Brain aneurysm 2012   2cms.  . Breast cancer ([place]) 06/05/2013  . Breast disorder    lump in left breast  . Carcinoid tumor   . Complication of anesthesia    hallucinations , anxiety   . Depression   . H/O echocardiogram    not finding report in EPIC, pt. reports that it was before her appt. /w Dr. Johnsie Cancel   . Hepatitis B dx'd ~ 1980  . Hepatitis C dx'd ~ 2000   "specialist says it cleared itself on it's own"  . Hx of radiation therapy 07/17/13-09/03/13   left breast,60.4Gy, 32/33 fx completed  . Hyperlipidemia   . Hypertension    "never took my RX" (07/31/2014)  . Jaundice   . Malignant neoplasm of lower-outer quadrant of female breast ([place]) 05/19/2013  . Pain management    goes to [facility] clinic, but she doesn't intend to return   . Personal history of radiation therapy     2014  . Stroke [facility]) 2012   TIA  . TIA (transient ischemic attack) 06/2011   "can't see real well out of left eye since; weaker on left side since"  . Varicose veins   . Walking pneumonia ~ 2008   Past Surgical History:  Procedure Laterality Date  . BREAST BIOPSY Left 03/2013; 05/2013  . BREAST LUMPECTOMY Left 04/02/2013   Procedure: Excision  of Left Breast Mass;  Surgeon: Harl Bowie, MD;  Location: [place];  Service: General;  Laterality: Left;  . BREAST LUMPECTOMY WITH SENTINEL LYMPH NODE BIOPSY Left 06/05/2013   Procedure: BREAST LUMPECTOMY WITH SENTINEL LYMPH NODE BX;  Surgeon: Harl Bowie, MD;  Location: [place];  Service: General;  Laterality: Left;  . CARDIAC CATHETERIZATION  12/24/2017  . COLON SURGERY  03/2013   bowel obstruction , malignant tumor    . DILATION AND CURETTAGE OF UTERUS  ~ 1990   S/P miscarriage  . EVACUATION BREAST HEMATOMA Left 06/05/2013   Procedure: EVACUATION HEMATOMA BREAST;  Surgeon: Harl Bowie, MD;  Location: [place];  Service: General;  Laterality: Left;  . LAPAROSCOPY N/A 04/02/2013   Procedure: Diaganostic Lap.; Small Bowel Resection;  Surgeon: Harl Bowie, MD;  Location: [place];  Service: General;  Laterality: N/A;  . LEFT HEART CATH AND CORONARY ANGIOGRAPHY N/A 12/24/2017   Procedure: LEFT HEART CATH AND CORONARY ANGIOGRAPHY;  Surgeon: Jettie Booze, MD;  Location: [place] [facility];  Service: Cardiovascular;  Laterality: N/A;  . TUMOR EXCISION  04/02/2013   "carcoid tumor removed"  . vaginal births   25    birthed twins at -[redacted] weeks gestation - both survived , total of 3 pregnancies - all born vaginally   Allergies  Allergen Reactions  . Statins Swelling and Other (See Comments)    Stomach swelling, muscle aches, and abdominal pain   . Tramadol Anxiety and Other (See Comments)    Extreme muscle pain and agitation    . Ace Inhibitors Other (See Comments)    Palpitations  . Lisinopril     COUGH   . Prozac  [Fluoxetine] Other (See Comments)    Causes the patient to be JITTERY and WEEPY  . Ciprofloxacin Hcl Palpitations  . Sertraline Hcl Diarrhea   No current facility-administered medications on file prior to encounter.    Current Outpatient Medications on File Prior to Encounter  Medication Sig Dispense Refill  . acetaminophen (TYLENOL) 500 MG tablet Take 500 mg by mouth 2 (two) times daily as needed (for back and knee pain).     Marland Kitchen ALPRAZolam (XANAX) 1 MG tablet Take 1 tablet (1 mg total) by mouth 2 (two) times daily as needed for anxiety. 45 tablet 0  . aspirin EC 81 MG tablet Take 81 mg by mouth daily.    Marland Kitchen lisinopril (PRINIVIL,ZESTRIL) 10 MG tablet TAKE 1 TABLET BY MOUTH EVERY DAY 30 tablet 2  . losartan (COZAAR) 25 MG tablet Take 1 tablet (25 mg total) by mouth daily. 90 tablet 3  . Melatonin 10 MG SUBL Place 1 tablet under the tongue at bedtime as needed (sleep).    . metoprolol succinate (TOPROL-XL) 25 MG 24 hr tablet Take 0.5 tablets (12.5 mg total) by mouth daily. 90 tablet 3  . nicotine (NICODERM CQ) 14 mg/24hr patch Place 1 patch (14 mg total) onto the skin daily. 28 patch 0  . nicotine (NICODERM CQ) 7 mg/24hr patch Place 1 patch (7 mg total) onto the skin daily. FOR 2 WEEKS. TO BE USED ONCE YOU COMPLETE THE 1 MONTH TREATMENT 14 patch 0  . nitroGLYCERIN (NITROSTAT) 0.4 MG SL tablet Place 1 tablet (0.4 mg total) under the tongue every 5 (five) minutes x 3 doses as needed for chest pain. 25 tablet 1  . Turmeric 500 MG CAPS Take 500 mg by mouth daily.     Social History   Socioeconomic History  . Marital status: Married    Spouse name: Not on file  . Number of children: 3  . Years of education: Not on file  . Highest education level: Not on file  Occupational History  . Occupation: disabled  Social Needs  . Financial resource strain: Not on file  . Food insecurity:    Worry: Not on file    Inability: Not on file  . Transportation needs:    Medical: Not on file     Non-medical: Not on file  Tobacco Use  . Smoking status: Current Some Day Smoker    Packs/day: 0.50    Years: 36.00    Pack years: 18.00    Types: Cigarettes    Last attempt to quit: 12/22/2017    Years since quitting: 0.3  . Smokeless tobacco: Never Used  . Tobacco comment: smokes some days, use patch some days  Substance and Sexual Activity  . Alcohol use: Yes    Alcohol/week: 3.0 standard drinks    Types: 3 Glasses of wine per week  .  Drug use: Yes    Types: Marijuana    Comment: "last marijuana ~ 06/2014  . Sexual activity: Yes    Birth control/protection: None  Lifestyle  . Physical activity:    Days per week: Not on file    Minutes per session: Not on file  . Stress: Not on file  Relationships  . Social connections:    Talks on phone: Not on file    Gets together: Not on file    Attends religious service: Not on file    Active member of club or organization: Not on file    Attends meetings of clubs or organizations: Not on file    Relationship status: Not on file  . Intimate partner violence:    Fear of current or ex partner: Not on file    Emotionally abused: Not on file    Physically abused: Not on file    Forced sexual activity: Not on file  Other Topics Concern  . Not on file  Social History Narrative  . Not on file   Family History  Problem Relation Age of Onset  . Diabetes Paternal Grandfather   . Diabetes Paternal Grandmother   . Diabetes Mother   . Heart disease Mother   . Cancer Mother        cervical  . Diabetes Brother   . Stroke Brother   . Heart disease Brother   . Heart disease Sister   . Diabetes Sister   . Cancer Sister        cervical  . Arrhythmia Sister   . Cancer Father        lung    OBJECTIVE:  Vitals:   04/26/18 1317  BP: 98/66  Pulse: 68  Resp: 18  Temp: 97.9 F (36.6 C)  SpO2: 97%    General appearance: AOx3 in no acute distress HEENT: NCAT.  Oropharynx clear.  Lungs: clear to auscultation bilaterally without  adventitious breath sounds Heart: regular rate and rhythm.  Radial pulses 2+ symmetrical bilaterally Abdomen: soft, distended; hyperactive bowel sounds; tender to palpation about the LUQ; nontender at McBurney's point; negative Murphy's sign; negative rebound; no guarding Back: no CVA tenderness Extremities: no edema; symmetrical with no gross deformities Skin: warm and dry Neurologic: normal gait Psychological: alert and cooperative; normal mood and affect   ASSESSMENT & PLAN:  1. Left upper quadrant pain     No orders of the defined types were placed in this encounter.  Recommend further evaluation and management in the ED  Patient aware and in agreement with this plan.   Discharged in stable condition.    Reviewed expectations re: course of current medical issues. Questions answered. Outlined signs and symptoms indicating need for more acute intervention. Patient verbalized understanding. After Visit Summary given.   Lestine Box, PA-C 04/26/18 1348

## 2018-04-26 NOTE — Discharge Instructions (Addendum)
Recommend further evaluation and management in the ED  Patient aware and in agreement with this plan.   Discharged in stable condition.

## 2018-04-26 NOTE — ED Triage Notes (Signed)
Pt states hx of stomach cancer with abdominal pain and swelling. She states she also has had diarrhea with some black specks.

## 2018-04-26 NOTE — ED Notes (Signed)
Pt pulled out own IV states that she is leaving r/t wait, encourage pt to stay, pt refused.

## 2018-04-27 ENCOUNTER — Emergency Department (HOSPITAL_COMMUNITY)
Admission: EM | Admit: 2018-04-27 | Discharge: 2018-04-27 | Disposition: A | Discharge status: Home / Self Care | Payer: Medicaid Other | Attending: Emergency Medicine | Admitting: Emergency Medicine

## 2018-04-27 ENCOUNTER — Emergency Department (HOSPITAL_COMMUNITY): Payer: Medicaid Other

## 2018-04-27 ENCOUNTER — Encounter (HOSPITAL_COMMUNITY): Payer: Self-pay

## 2018-04-27 DIAGNOSIS — I10 Essential (primary) hypertension: Secondary | ICD-10-CM | POA: Insufficient documentation

## 2018-04-27 DIAGNOSIS — Z7982 Long term (current) use of aspirin: Secondary | ICD-10-CM | POA: Diagnosis not present

## 2018-04-27 DIAGNOSIS — R1012 Left upper quadrant pain: Secondary | ICD-10-CM | POA: Insufficient documentation

## 2018-04-27 DIAGNOSIS — F17210 Nicotine dependence, cigarettes, uncomplicated: Secondary | ICD-10-CM | POA: Insufficient documentation

## 2018-04-27 DIAGNOSIS — Z79899 Other long term (current) drug therapy: Secondary | ICD-10-CM | POA: Diagnosis not present

## 2018-04-27 LAB — URINALYSIS, ROUTINE W REFLEX MICROSCOPIC
Bilirubin Urine: NEGATIVE
Glucose, UA: NEGATIVE mg/dL
Hgb urine dipstick: NEGATIVE
Ketones, ur: NEGATIVE mg/dL
Leukocytes, UA: NEGATIVE
Nitrite: NEGATIVE
Protein, ur: NEGATIVE mg/dL
Specific Gravity, Urine: 1.009 (ref 1.005–1.030)
pH: 5 (ref 5.0–8.0)

## 2018-04-27 LAB — COMPREHENSIVE METABOLIC PANEL
ALT: 22 U/L (ref 0–44)
AST: 18 U/L (ref 15–41)
Albumin: 4.1 g/dL (ref 3.5–5.0)
Alkaline Phosphatase: 64 U/L (ref 38–126)
Anion gap: 9 (ref 5–15)
BILIRUBIN TOTAL: 0.7 mg/dL (ref 0.3–1.2)
BUN: 14 mg/dL (ref 8–23)
CHLORIDE: 108 mmol/L (ref 98–111)
CO2: 25 mmol/L (ref 22–32)
CREATININE: 0.69 mg/dL (ref 0.44–1.00)
Calcium: 9.5 mg/dL (ref 8.9–10.3)
GFR calc Af Amer: >60 mL/min (ref >60)
GFR calc non Af Amer: >60 mL/min (ref >60)
Glucose, Bld: 93 mg/dL (ref 70–99)
POTASSIUM: 4.5 mmol/L (ref 3.5–5.1)
Sodium: 142 mmol/L (ref 135–145)
TOTAL PROTEIN: 7 g/dL (ref 6.5–8.1)

## 2018-04-27 LAB — CBC
HCT: 40.5 % (ref 36.0–46.0)
Hemoglobin: 13.5 g/dL (ref 12.0–15.0)
MCH: 29.2 pg (ref 26.0–34.0)
MCHC: 33.3 g/dL (ref 30.0–36.0)
MCV: 87.7 fL (ref 78.0–100.0)
PLATELETS: 269 10*3/uL (ref 150–400)
RBC: 4.62 MIL/uL (ref 3.87–5.11)
RDW: 14.1 % (ref 11.5–15.5)
WBC: 6.4 10*3/uL (ref 4.0–10.5)

## 2018-04-27 LAB — LIPASE, BLOOD: Lipase: 26 U/L (ref 11–51)

## 2018-04-27 MED ORDER — MORPHINE SULFATE (PF) 4 MG/ML IV SOLN
4.0000 mg | Freq: Once | INTRAVENOUS | Status: AC
Start: 2018-04-27 — End: 2018-04-27
  Administered 2018-04-27: 4 mg via INTRAVENOUS
  Filled 2018-04-27: qty 1

## 2018-04-27 MED ORDER — ONDANSETRON HCL 4 MG/2ML IJ SOLN
4.0000 mg | Freq: Once | INTRAMUSCULAR | Status: AC
  Administered 2018-04-27: 4 mg via INTRAVENOUS
  Filled 2018-04-27: qty 2

## 2018-04-27 MED ORDER — IOPAMIDOL (ISOVUE-300) INJECTION 61%
100.0000 mL | Freq: Once | INTRAVENOUS | Status: AC | PRN
  Administered 2018-04-27: 100 mL via INTRAVENOUS

## 2018-04-27 MED ORDER — MORPHINE SULFATE (PF) 2 MG/ML IV SOLN
2.0000 mg | Freq: Once | INTRAVENOUS | Status: AC
  Administered 2018-04-27: 2 mg via INTRAVENOUS
  Filled 2018-04-27: qty 1

## 2018-04-27 MED ORDER — IOPAMIDOL (ISOVUE-300) INJECTION 61%
INTRAVENOUS | Status: AC
  Filled 2018-04-27: qty 100

## 2018-04-27 NOTE — ED Provider Notes (Signed)
[facility] DEPT Provider Note   CSN: [id number] Arrival date & time: 04/27/18  1109     History   Chief Complaint Chief Complaint  Patient presents with  . Abdominal Pain    HPI [name] is a 62 y.o. female.  53-year-old female with prior medical history as detailed below presents with complaint of abdominal pain.  Patient reports intermittent left upper quadrant pain ongoing for the last several months. She was at [facility] ED yesterday for same - she left AMA prior to completion of workup.  She denies associated vomiting, fever, back pain, chest pain, or other acute complaint.  Symptoms "come and go" and she reports that she has seen multiple providers in the past for the same complaint.   The history is provided by the patient and medical records.  Abdominal Pain   This is a recurrent problem. The current episode started more than 1 week ago. The problem occurs every several days. The problem has not changed since onset.The pain is located in the LUQ. The quality of the pain is aching. The pain is mild. Associated symptoms include nausea. Pertinent negatives include fever and vomiting. Nothing aggravates the symptoms. Nothing relieves the symptoms.    Past Medical History:  Diagnosis Date  . Adjustment disorder with mixed anxiety and depressed mood 01/05/2013  . Allergy   . Anxiety   . Arthritis    "knees,wrists" (07/31/2014)  . Brain aneurysm 2012   2cms.  . Breast cancer ([place]) 06/05/2013  . Breast disorder    lump in left breast  . Carcinoid tumor   . Complication of anesthesia    hallucinations , anxiety   . Depression   . H/O echocardiogram    not finding report in EPIC, pt. reports that it was before her appt. /w Dr. Johnsie Cancel   . Hepatitis B dx'd ~ 1980  . Hepatitis C dx'd ~ 2000   "specialist says it cleared itself on it's own"  . Hx of radiation therapy 07/17/13-09/03/13   left breast,60.4Gy, 32/33 fx completed  . Hyperlipidemia   .  Hypertension    "never took my RX" (07/31/2014)  . Jaundice   . Malignant neoplasm of lower-outer quadrant of female breast ([place]) 05/19/2013  . Pain management    goes to [facility] clinic, but she doesn't intend to return   . Personal history of radiation therapy    2014  . Stroke [facility]) 2012   TIA  . TIA (transient ischemic attack) 06/2011   "can't see real well out of left eye since; weaker on left side since"  . Varicose veins   . Walking pneumonia ~ 2008    Patient Active Problem List   Diagnosis Date Noted  . Takotsubo cardiomyopathy 12/25/2017  . Cerebrovascular accident (CVA) ([place])   . 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 Elevated troponin   . NSTEMI (non-ST elevated myocardial infarction) ([place]) 12/23/2017  . Head lice infestation 01/75/1025  . Chest pain 07/31/2014  . 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 Partial small bowel obstruction ([place]) 04/07/2013  . SBO (small bowel obstruction) ([place]) 01/05/2013  . Adjustment disorder with mixed anxiety and depressed mood 01/05/2013  . Chronic pain syndrome 01/05/2013  . Thyroid cyst 01/05/2013  . Tobacco abuse 12/05/2012  . Hepatitis C   . Jaundice   . Hyperlipidemia   . Varicose veins   .  Brain aneurysm   . TIA (transient ischemic attack) 06/12/2011    Past Surgical History:  Procedure Laterality Date  . BREAST BIOPSY Left 03/2013; 05/2013  . BREAST LUMPECTOMY Left 04/02/2013   Procedure: Excision  of Left Breast Mass;  Surgeon: Harl Bowie, MD;  Location: [place];  Service: General;  Laterality: Left;  . BREAST LUMPECTOMY WITH SENTINEL LYMPH NODE BIOPSY Left 06/05/2013   Procedure: BREAST LUMPECTOMY WITH SENTINEL LYMPH NODE BX;  Surgeon: Harl Bowie, MD;  Location: [place];  Service: General;  Laterality: Left;  . CARDIAC CATHETERIZATION  12/24/2017  . COLON SURGERY  03/2013   bowel obstruction , malignant tumor     . DILATION AND CURETTAGE OF UTERUS  ~ 1990   S/P miscarriage  . EVACUATION BREAST HEMATOMA Left 06/05/2013   Procedure: EVACUATION HEMATOMA BREAST;  Surgeon: Harl Bowie, MD;  Location: [place];  Service: General;  Laterality: Left;  . LAPAROSCOPY N/A 04/02/2013   Procedure: Diaganostic Lap.; Small Bowel Resection;  Surgeon: Harl Bowie, MD;  Location: [place];  Service: General;  Laterality: N/A;  . LEFT HEART CATH AND CORONARY ANGIOGRAPHY N/A 12/24/2017   Procedure: LEFT HEART CATH AND CORONARY ANGIOGRAPHY;  Surgeon: Jettie Booze, MD;  Location: [place] [facility];  Service: Cardiovascular;  Laterality: N/A;  . TUMOR EXCISION  04/02/2013   "carcoid tumor removed"  . vaginal births   61    birthed twins at -[redacted] weeks gestation - both survived , total of 3 pregnancies - all born vaginally     OB History    Gravida  5   Para  4   Term      Preterm  2   AB  1   Living  4     SAB      TAB  1   Ectopic      Multiple      Live Births               Home Medications    Prior to Admission medications   Medication Sig Start Date End Date Taking? Authorizing Provider  acetaminophen (TYLENOL) 500 MG tablet Take 500 mg by mouth 2 (two) times daily as needed (for back and knee pain).     [provider]  ALPRAZolam Duanne Moron) 1 MG tablet Take 1 tablet (1 mg total) by mouth 2 (two) times daily as needed for anxiety. 06/04/15   Nicholas Lose, MD  aspirin EC 81 MG tablet Take 81 mg by mouth daily.    [provider]  lisinopril (PRINIVIL,ZESTRIL) 10 MG tablet TAKE 1 TABLET BY MOUTH EVERY DAY 04/05/18   Skeet Latch, MD  losartan (COZAAR) 25 MG tablet Take 1 tablet (25 mg total) by mouth daily. 03/27/18 06/25/18  Skeet Latch, MD  Melatonin 10 MG SUBL Place 1 tablet under the tongue at bedtime as needed (sleep).    [provider]  metoprolol succinate (TOPROL-XL) 25 MG 24 hr tablet Take 0.5 tablets (12.5 mg total) by mouth daily.  12/25/17   Duke, Tami Lin, PA  nicotine (NICODERM CQ) 14 mg/24hr patch Place 1 patch (14 mg total) onto the skin daily. 03/27/18   Skeet Latch, MD  nicotine (NICODERM CQ) 7 mg/24hr patch Place 1 patch (7 mg total) onto the skin daily. FOR 2 WEEKS. TO BE USED ONCE YOU COMPLETE THE 1 MONTH TREATMENT 03/27/18   Skeet Latch, MD  nitroGLYCERIN (NITROSTAT) 0.4 MG SL tablet Place 1 tablet (0.4 mg  total) under the tongue every 5 (five) minutes x 3 doses as needed for chest pain. 12/25/17   Ledora Bottcher, PA  Turmeric 500 MG CAPS Take 500 mg by mouth daily.    [provider]    Family History Family History  Problem Relation Age of Onset  . Diabetes Paternal Grandfather   . Diabetes Paternal Grandmother   . Diabetes Mother   . Heart disease Mother   . Cancer Mother        cervical  . Diabetes Brother   . Stroke Brother   . Heart disease Brother   . Heart disease Sister   . Diabetes Sister   . Cancer Sister        cervical  . Arrhythmia Sister   . Cancer Father        lung    Social History Social History   Tobacco Use  . Smoking status: Current Some Day Smoker    Packs/day: 0.50    Years: 36.00    Pack years: 18.00    Types: Cigarettes    Last attempt to quit: 12/22/2017    Years since quitting: 0.3  . Smokeless tobacco: Never Used  . Tobacco comment: smokes some days, use patch some days  Substance Use Topics  . Alcohol use: Yes    Alcohol/week: 3.0 standard drinks    Types: 3 Glasses of wine per week  . Drug use: Yes    Types: Marijuana    Comment: "last marijuana ~ 06/2014     Allergies   Statins; Tramadol; Ace inhibitors; Lisinopril; Prozac [fluoxetine]; Ciprofloxacin hcl; and Sertraline hcl   Review of Systems Review of Systems  Constitutional: Negative for fever.  Gastrointestinal: Positive for abdominal pain and nausea. Negative for vomiting.  All other systems reviewed and are negative.    Physical Exam Updated Vital Signs BP  113/63 (BP Location: Left Arm)   Pulse (!) 58   Temp 98 F (36.7 C) (Oral)   Resp 18   Ht 5\' 6"  (1.676 m)   Wt 76.7 kg   SpO2 100%   BMI 27.28 kg/m   Physical Exam  Constitutional: She is oriented to person, place, and time. She appears well-developed and well-nourished. No distress.  HENT:  Head: Normocephalic and atraumatic.  Mouth/Throat: Oropharynx is clear and moist.  Eyes: Pupils are equal, round, and reactive to light. Conjunctivae and EOM are normal.  Neck: Normal range of motion. Neck supple.  Cardiovascular: Normal rate, regular rhythm and normal heart sounds.  Pulmonary/Chest: Effort normal and breath sounds normal. No respiratory distress.  Abdominal: Soft. She exhibits no distension. There is tenderness in the left upper quadrant.  Mild tenderness to the LUQ   Musculoskeletal: Normal range of motion. She exhibits no edema or deformity.  Neurological: She is alert and oriented to person, place, and time.  Skin: Skin is warm and dry.  Psychiatric: She has a normal mood and affect.  Nursing note and vitals reviewed.    ED Treatments / Results  Labs (all labs ordered are listed, but only abnormal results are displayed) Labs Reviewed  LIPASE, BLOOD  COMPREHENSIVE METABOLIC PANEL  CBC  URINALYSIS, ROUTINE W REFLEX MICROSCOPIC    EKG None  Radiology Ct Abdomen Pelvis W Contrast  Result Date: 04/27/2018 CLINICAL DATA:  62 year old female with LEFT abdominal pain and swelling for 1 week. EXAM: CT ABDOMEN AND PELVIS WITH CONTRAST TECHNIQUE: Multidetector CT imaging of the abdomen and pelvis was performed using the standard protocol  following bolus administration of intravenous contrast. CONTRAST:  165mL ISOVUE-300 IOPAMIDOL (ISOVUE-300) INJECTION 61% COMPARISON:  06/25/2015 CT FINDINGS: Lower chest: No acute abnormality. Hepatobiliary: Faint hyperdensity is noted in the area of known RIGHT hepatic lesion. No new or acute hepatic abnormality noted. The gallbladder is  unremarkable. No biliary dilatation. Pancreas: Unremarkable Spleen: Unremarkable Adrenals/Urinary Tract: The kidneys, adrenal glands and bladder are unremarkable. Stomach/Bowel: Bowel surgery changes in the pelvis are again noted. There is no evidence of bowel obstruction, definite bowel wall thickening or inflammatory changes. Vascular/Lymphatic: Aortic atherosclerosis. No enlarged abdominal or pelvic lymph nodes. Reproductive: Uterus and bilateral adnexa are unremarkable. Other: No free fluid, abscess or pneumoperitoneum. Musculoskeletal: No acute or suspicious bony abnormalities. IMPRESSION: 1. No evidence of acute abnormality 2.  Aortic Atherosclerosis (ICD10-I70.0). Electronically Signed   By: Margarette Canada M.D.   On: 04/27/2018 13:46    Procedures Procedures (including critical care time)  Medications Ordered in ED Medications  iopamidol (ISOVUE-300) 61 % injection (has no administration in time range)  ondansetron (ZOFRAN) injection 4 mg (4 mg Intravenous Given 04/27/18 1155)  morphine 2 MG/ML injection 2 mg (2 mg Intravenous Given 04/27/18 1157)  iopamidol (ISOVUE-300) 61 % injection 100 mL (100 mLs Intravenous Contrast Given 04/27/18 1301)  morphine 4 MG/ML injection 4 mg (4 mg Intravenous Given 04/27/18 1441)     Initial Impression / Assessment and Plan / ED Course  I have reviewed the triage vital signs and the nursing notes.  Pertinent labs & imaging results that were available during my care of the patient were reviewed by me and considered in my medical decision making (see chart for details).     MDM  Screen complete  Patient is presenting for evaluation of left upper quadrant abdominal pain. Pain appears to be somewhat chronic in nature.   Screening labs today are without significant pathology.  Of note, patient's labs today are without significant difference from labs obtained yesterday.  CT imaging does not reveal any acute pathology in that abdomen.  Patient feels improved  following her ED workup. She understands the need for close follow up (she has an already established appointment with [facility] on Monday).   Strict return precautions given and understood.   Final Clinical Impressions(s) / ED Diagnoses   Final diagnoses:  Left upper quadrant pain    ED Discharge Orders    None       Valarie Merino, MD 04/27/18 1554

## 2018-04-27 NOTE — Discharge Instructions (Addendum)
Please return for any problem.  Follow-up with [facility] on Monday as previously scheduled.

## 2018-04-27 NOTE — ED Triage Notes (Signed)
Pt presents with c/o upper left quadrant abdominal pain for the past week. Pt reports that she went to [facility] yesterday but left before her care was complete because of the wait and the service. Pt reports she is having some specks of black in her stool. Pt does have a hx of stomach cancer. Pt reports her stomach is swelling.

## 2018-05-14 ENCOUNTER — Other Ambulatory Visit: Payer: Self-pay | Admitting: Gastroenterology

## 2018-05-14 DIAGNOSIS — R197 Diarrhea, unspecified: Secondary | ICD-10-CM

## 2018-05-14 DIAGNOSIS — R1012 Left upper quadrant pain: Secondary | ICD-10-CM

## 2018-05-14 DIAGNOSIS — C7A019 Malignant carcinoid tumor of the small intestine, unspecified portion: Secondary | ICD-10-CM

## 2018-05-17 ENCOUNTER — Ambulatory Visit
Admission: RE | Admit: 2018-05-17 | Discharge: 2018-05-17 | Disposition: A | Discharge status: Home / Self Care | Payer: Medicaid Other | Source: Ambulatory Visit | Attending: Gastroenterology | Admitting: Gastroenterology

## 2018-05-17 ENCOUNTER — Other Ambulatory Visit: Payer: Self-pay | Admitting: Gastroenterology

## 2018-05-17 DIAGNOSIS — C7A019 Malignant carcinoid tumor of the small intestine, unspecified portion: Secondary | ICD-10-CM

## 2018-05-17 DIAGNOSIS — R197 Diarrhea, unspecified: Secondary | ICD-10-CM

## 2018-05-17 DIAGNOSIS — R1012 Left upper quadrant pain: Secondary | ICD-10-CM

## 2018-06-12 ENCOUNTER — Other Ambulatory Visit: Payer: Self-pay | Admitting: Hematology and Oncology

## 2018-06-12 DIAGNOSIS — Z853 Personal history of malignant neoplasm of breast: Secondary | ICD-10-CM

## 2018-06-27 ENCOUNTER — Ambulatory Visit (INDEPENDENT_AMBULATORY_CARE_PROVIDER_SITE_OTHER): Payer: Medicaid Other | Admitting: Cardiovascular Disease

## 2018-06-27 ENCOUNTER — Encounter: Payer: Self-pay | Admitting: Cardiovascular Disease

## 2018-06-27 VITALS — BP 142/86 | HR 71 | Ht 66.0 in | Wt 169.0 lb

## 2018-06-27 DIAGNOSIS — Z8673 Personal history of transient ischemic attack (TIA), and cerebral infarction without residual deficits: Secondary | ICD-10-CM | POA: Diagnosis not present

## 2018-06-27 DIAGNOSIS — Z720 Tobacco use: Secondary | ICD-10-CM

## 2018-06-27 DIAGNOSIS — I10 Essential (primary) hypertension: Secondary | ICD-10-CM

## 2018-06-27 MED ORDER — BUPROPION HCL ER (SR) 150 MG PO TB12: ORAL_TABLET | ORAL | 1 refills | Status: DC

## 2018-06-27 NOTE — Patient Instructions (Signed)
Medication Instructions:  START WELLBUTRIN 150 MG DAILY FOR 3 DAYS AND THEN INCREASE TO 1 TABLET TWICE A DAY FOR A TOTAL OF 12 WEEKS. STOP SMOKING 5-7 DAYS AFTER STARTING WELLBUTRIN  If you need a refill on your cardiac medications before your next appointment, please call your pharmacy.   Lab work: NONE  Testing/Procedures: NONE  Follow-Up: At [facility], you and your health needs are our priority.  As part of our continuing mission to provide you with exceptional heart care, we have created designated Provider Care Teams.  These Care Teams include your primary Cardiologist (physician) and Advanced Practice Providers (APPs -  Physician Assistants and Nurse Practitioners) who all work together to provide you with the care you need, when you need it. You will need a follow up appointment in 2 months. You may see Skeet Latch, MD or one of the following Advanced Practice Providers on your designated Care Team:   Kerin Ransom, PA-C Roby Lofts, [place] . Sande Rives, PA-C   Bupropion tablets (smoking cessation) What is this medicine? BUPROPION (byoo PROE pee on) is used to help people quit smoking. This medicine may be used for other purposes; ask your health care provider or pharmacist if you have questions. COMMON BRAND NAME(S): Buproban, Zyban What should I tell my health care provider before I take this medicine? They need to know if you have any of these conditions: -an eating disorder, such as anorexia or bulimia -bipolar disorder or psychosis -diabetes or high blood sugar, treated with medication -glaucoma -head injury or brain tumor -heart disease, previous heart attack, or irregular heart beat -high blood pressure -kidney or liver disease -seizures -suicidal thoughts or a previous suicide attempt -Tourette's syndrome -weight loss -an unusual or allergic reaction to bupropion, other medicines, foods, dyes, or preservatives -breast-feeding -pregnant or trying to  become pregnant How should I use this medicine? Take this medicine by mouth with a glass of water. Follow the directions on the prescription label. You can take it with or without food. If it upsets your stomach, take it with food. Do not cut, crush or chew this medicine. Take your medicine at regular intervals. If you take this medicine more than once a day, take your second dose at least 8 hours after you take your first dose. To limit difficulty in sleeping, avoid taking this medicine at bedtime. Do not take your medicine more often than directed. Do not stop taking this medicine suddenly except upon the advice of your doctor. Stopping this medicine too quickly may cause serious side effects. A special MedGuide will be given to you by the pharmacist with each prescription and refill. Be sure to read this information carefully each time. Talk to your pediatrician regarding the use of this medicine in children. Special care may be needed. Overdosage: If you think you have taken too much of this medicine contact a poison control center or emergency room at once. NOTE: This medicine is only for you. Do not share this medicine with others. What if I miss a dose? If you miss a dose, skip the missed dose and take your next tablet at the regular time. There should be at least 8 hours between doses. Do not take double or extra doses. What may interact with this medicine? Do not take this medicine with any of the following medications: -linezolid -MAOIs like Azilect, Carbex, Eldepryl, Marplan, Nardil, and Parnate -methylene blue (injected into a vein) -other medicines that contain bupropion like Wellbutrin This medicine may also  interact with the following medications: -alcohol -certain medicines for anxiety or sleep -certain medicines for blood pressure like metoprolol, propranolol -certain medicines for depression or psychotic disturbances -certain medicines for HIV or AIDS like efavirenz, lopinavir,  nelfinavir, ritonavir -certain medicines for irregular heart beat like propafenone, flecainide -certain medicines for Parkinson's disease like amantadine, levodopa -certain medicines for seizures like carbamazepine, phenytoin, phenobarbital -cimetidine -clopidogrel -cyclophosphamide -digoxin -furazolidone -isoniazid -nicotine -orphenadrine -procarbazine -steroid medicines like prednisone or cortisone -stimulant medicines for attention disorders, weight loss, or to stay awake -tamoxifen -theophylline -thiotepa -ticlopidine -tramadol -warfarin This list may not describe all possible interactions. Give your health care provider a list of all the medicines, herbs, non-prescription drugs, or dietary supplements you use. Also tell them if you smoke, drink alcohol, or use illegal drugs. Some items may interact with your medicine. What should I watch for while using this medicine? Visit your doctor or health care professional for regular checks on your progress. This medicine should be used together with a patient support program. It is important to participate in a behavioral program, counseling, or other support program that is recommended by your health care professional. Patients and their families should watch out for new or worsening thoughts of suicide or depression. Also watch out for sudden changes in feelings such as feeling anxious, agitated, panicky, irritable, hostile, aggressive, impulsive, severely restless, overly excited and hyperactive, or not being able to sleep. If this happens, especially at the beginning of treatment or after a change in dose, call your health care professional. Avoid alcoholic drinks while taking this medicine. Drinking excessive alcoholic beverages, using sleeping or anxiety medicines, or quickly stopping the use of these agents while taking this medicine may increase your risk for a seizure. Do not drive or use heavy machinery until you know how this  medicine affects you. This medicine can impair your ability to perform these tasks. Do not take this medicine close to bedtime. It may prevent you from sleeping. Your mouth may get dry. Chewing sugarless gum or sucking hard candy, and drinking plenty of water may help. Contact your doctor if the problem does not go away or is severe. Do not use nicotine patches or chewing gum without the advice of your doctor or health care professional while taking this medicine. You may need to have your blood pressure taken regularly if your doctor recommends that you use both nicotine and this medicine together. What side effects may I notice from receiving this medicine? Side effects that you should report to your doctor or health care professional as soon as possible: -allergic reactions like skin rash, itching or hives, swelling of the face, lips, or tongue -breathing problems -changes in vision -confusion -elevated mood, decreased need for sleep, racing thoughts, impulsive behavior -fast or irregular heartbeat -hallucinations, loss of contact with reality -increased blood pressure -redness, blistering, peeling or loosening of the skin, including inside the mouth -seizures -suicidal thoughts or other mood changes -unusually weak or tired -vomiting Side effects that usually do not require medical attention (report to your doctor or health care professional if they continue or are bothersome): -constipation -headache -loss of appetite -nausea -tremors -weight loss This list may not describe all possible side effects. Call your doctor for medical advice about side effects. You may report side effects to FDA at 1-800-FDA-1088. Where should I keep my medicine? Keep out of the reach of children. Store at room temperature between 20 and 25 degrees C (68 and 77 degrees F). Protect from light.  Keep container tightly closed. Throw away any unused medicine after the expiration date. NOTE: This sheet is a  summary. It may not cover all possible information. If you have questions about this medicine, talk to your doctor, pharmacist, or health care provider.  2018 Elsevier/Gold Standard (2016-02-18 13:49:28)

## 2018-06-27 NOTE — Progress Notes (Signed)
Cardiology Office Note   Date:  06/27/2018   ID:  [name], DOB April 16, 1956, MRN [id number]  PCP:  Chesley Noon, MD  Cardiologist:   Skeet Latch, MD   Chief Complaint  Patient presents with  . Follow-up    3 months  . Shortness of Breath     History of Present Illness: [name] is a 62 y.o. female  with Takatsubo cardiomyopathy, hypertension, hyperlipidemia, prior stroke, ongoing tobacco abuse, and breast cancer status post XRT.    She was admitted 12/2017 with chest pain and was found to have Takotsubo cardiomyopathy.  Echo that admission revealed LVEF 50 to 55% with grade 1 diastolic dysfunction and hypokinesis of the mid to apical inferolateral and inferior myocardium.  She underwent left heart catheterization 12/24/2017 that revealed 10% mid RCA stenosis and LVEF was 25 to 35% by left ventriculography.  She was started on metoprolol and no ACE inhibitor/ARB due to low blood pressure.  She followed up with Kerin Ransom, PA-C and was doing well.  She was started on lisinopril.  At her last appointment she reported cough since starting lisinopril so it was switched to losartan.  However, her cough persisted and the losartan made her feel poorly so she switched back to lisinopril.  Since her last appointment [name] has been very stressed.  Her grandchildren are having to move again soon.  She quit smoking with patches but has started back smoking due to stress.  She walks for 2-1/2 blocks daily and has no chest pain.  Her shortness of breath is chronic and unchanged.  She has no lower extremity edema, orthopnea, or PND.  She is also been very stressed about financial situations in her car not working.   Past Medical History:  Diagnosis Date  . Adjustment disorder with mixed anxiety and depressed mood 01/05/2013  . Allergy   . Anxiety   . Arthritis    "knees,wrists" (07/31/2014)  . Brain aneurysm 2012   2cms.  . Breast cancer ([place]) 06/05/2013  . Breast disorder     lump in left breast  . Carcinoid tumor   . Complication of anesthesia    hallucinations , anxiety   . Depression   . H/O echocardiogram    not finding report in EPIC, pt. reports that it was before her appt. /w Dr. Johnsie Cancel   . Hepatitis B dx'd ~ 1980  . Hepatitis C dx'd ~ 2000   "specialist says it cleared itself on it's own"  . Hx of radiation therapy 07/17/13-09/03/13   left breast,60.4Gy, 32/33 fx completed  . Hyperlipidemia   . Hypertension    "never took my RX" (07/31/2014)  . Jaundice   . Malignant neoplasm of lower-outer quadrant of female breast ([place]) 05/19/2013  . Pain management    goes to [facility] clinic, but she doesn't intend to return   . Personal history of radiation therapy    2014  . Stroke [facility]) 2012   TIA  . TIA (transient ischemic attack) 06/2011   "can't see real well out of left eye since; weaker on left side since"  . Varicose veins   . Walking pneumonia ~ 2008    Past Surgical History:  Procedure Laterality Date  . BREAST BIOPSY Left 03/2013; 05/2013  . BREAST LUMPECTOMY Left 04/02/2013   Procedure: Excision  of Left Breast Mass;  Surgeon: Harl Bowie, MD;  Location: [place];  Service: General;  Laterality: Left;  . BREAST LUMPECTOMY WITH  SENTINEL LYMPH NODE BIOPSY Left 06/05/2013   Procedure: BREAST LUMPECTOMY WITH SENTINEL LYMPH NODE BX;  Surgeon: Harl Bowie, MD;  Location: [place];  Service: General;  Laterality: Left;  . CARDIAC CATHETERIZATION  12/24/2017  . COLON SURGERY  03/2013   bowel obstruction , malignant tumor    . DILATION AND CURETTAGE OF UTERUS  ~ 1990   S/P miscarriage  . EVACUATION BREAST HEMATOMA Left 06/05/2013   Procedure: EVACUATION HEMATOMA BREAST;  Surgeon: Harl Bowie, MD;  Location: [place];  Service: General;  Laterality: Left;  . LAPAROSCOPY N/A 04/02/2013   Procedure: Diaganostic Lap.; Small Bowel Resection;  Surgeon: Harl Bowie, MD;  Location: [place];  Service: General;  Laterality: N/A;  . LEFT HEART CATH  AND CORONARY ANGIOGRAPHY N/A 12/24/2017   Procedure: LEFT HEART CATH AND CORONARY ANGIOGRAPHY;  Surgeon: Jettie Booze, MD;  Location: [facility] [facility];  Service: Cardiovascular;  Laterality: N/A;  . TUMOR EXCISION  04/02/2013   "carcoid tumor removed"  . vaginal births   15    birthed twins at -[redacted] weeks gestation - both survived , total of 3 pregnancies - all born vaginally     Current Outpatient Medications  Medication Sig Dispense Refill  . acetaminophen (TYLENOL) 500 MG tablet Take 500 mg by mouth 2 (two) times daily as needed (for back and knee pain).     Marland Kitchen ALPRAZolam (XANAX) 1 MG tablet Take 1 tablet (1 mg total) by mouth 2 (two) times daily as needed for anxiety. 45 tablet 0  . aspirin EC 81 MG tablet Take 81 mg by mouth daily.    Marland Kitchen lisinopril (PRINIVIL,ZESTRIL) 10 MG tablet TAKE 1 TABLET BY MOUTH EVERY DAY 30 tablet 2  . Melatonin 10 MG SUBL Place 1 tablet under the tongue at bedtime as needed (sleep).    . metoprolol succinate (TOPROL-XL) 25 MG 24 hr tablet Take 0.5 tablets (12.5 mg total) by mouth daily. 90 tablet 3  . nicotine (NICODERM CQ) 14 mg/24hr patch Place 1 patch (14 mg total) onto the skin daily. 28 patch 0  . nicotine (NICODERM CQ) 7 mg/24hr patch Place 1 patch (7 mg total) onto the skin daily. FOR 2 WEEKS. TO BE USED ONCE YOU COMPLETE THE 1 MONTH TREATMENT 14 patch 0  . nitroGLYCERIN (NITROSTAT) 0.4 MG SL tablet Place 1 tablet (0.4 mg total) under the tongue every 5 (five) minutes x 3 doses as needed for chest pain. 25 tablet 1  . buPROPion (WELLBUTRIN SR) 150 MG 12 hr tablet TAKE ONE DAILY BY MOUTH FOR 3 DAYS AND THEN INCREASE TO 1 TABLET TWICE A DAY FOR A TOTAL OF 12 WEEKS. STOP SMOKING 5-7 DAYS AFTER STARTING 180 tablet 1   No current facility-administered medications for this visit.     Allergies:   Statins; Tramadol; Ace inhibitors; Lisinopril; Prozac [fluoxetine]; Ciprofloxacin hcl; and Sertraline hcl    Social History:  The patient  reports that she  has been smoking cigarettes. She has a 18.00 pack-year smoking history. She has never used smokeless tobacco. She reports that she drinks about 3.0 standard drinks of alcohol per week. She reports that she has current or past drug history. Drug: Marijuana.   Family History:  The patient's family history includes Arrhythmia in her sister; Cancer in her father, mother, and sister; Diabetes in her brother, mother, paternal grandfather, paternal grandmother, and sister; Heart disease in her brother, mother, and sister; Stroke in her brother.    ROS:  Please see the history of present illness.   Otherwise, review of systems are positive for none.   All other systems are reviewed and negative.    PHYSICAL EXAM: VS:  BP (!) 142/86   Pulse 71   Ht 5\' 6"  (1.676 m)   Wt 169 lb (76.7 kg)   BMI 27.28 kg/m  , BMI Body mass index is 27.28 kg/m. GENERAL:  Well appearing.  Anxious HEENT: Pupils equal round and reactive, fundi not visualized, oral mucosa unremarkable NECK:  No jugular venous distention, waveform within normal limits, carotid upstroke brisk and symmetric, no bruits, no thyromegaly LUNGS:  Clear to auscultation bilaterally HEART:  RRR.  PMI not displaced or sustained,S1 and S2 within normal limits, no S3, no S4, no clicks, no rubs, no murmurs ABD:  Flat, positive bowel sounds normal in frequency in pitch, no bruits, no rebound, no guarding, no midline pulsatile mass, no hepatomegaly, no splenomegaly EXT:  2 plus pulses throughout, no edema, no cyanosis no clubbing SKIN:  No rashes no nodules NEURO:  Cranial nerves II through XII grossly intact, motor grossly intact throughout PSYCH:  Cognitively intact, oriented to person place and time   EKG:  EKG is not ordered today.   Recent Labs: 12/24/2017: Magnesium 1.9; TSH 1.333 04/27/2018: ALT 22; BUN 14; Creatinine, Ser 0.69; Hemoglobin 13.5; Platelets 269; Potassium 4.5; Sodium 142   Echo 12/24/17: Study Conclusions - Left ventricle:  Longitudinal global LV strain is borderlin abnormal at -15% with the abnormality primarly in the inferolateral wall. The cavity size was normal. There was mild focal basal hypertrophy of the septum. Systolic function was normal. The estimated ejection fraction was in the range of 50% to 55%. There is hypokinesis of the mid-apicalinferolateral and inferior myocardium. There was an increased relative contribution of atrial contraction to ventricular filling. Doppler parameters are consistent with abnormal left ventricular relaxation (grade 1 diastolic dysfunction). - Aortic valve: Mildly to moderately calcified annulus. Trileaflet; normal thickness leaflets. There was trivial regurgitation. - Mitral valve: Calcified annulus. - Pulmonic valve: There was trivial regurgitation.  Impressions: - Compared to prior echo, inferior and inferolateral wall motion abnormalities are new.   LHC 12/24/17:  Mid RCA lesion is 10% stenosed.  LV end diastolic pressure is mildly elevated.  There is moderate to severe left ventricular systolic dysfunction in the pattern of Takotsubo cardiomyopathy  The left ventricular ejection fraction is 25-35% by visual estimate.  There is no aortic valve stenosis.   Lipid Panel    Component Value Date/Time   CHOL 200 12/24/2017 0953   TRIG 131 12/24/2017 0953   HDL 76 12/24/2017 0953   CHOLHDL 2.6 12/24/2017 0953   VLDL 26 12/24/2017 0953   LDLCALC 98 12/24/2017 0953      Wt Readings from Last 3 Encounters:  06/27/18 169 lb (76.7 kg)  04/27/18 169 lb (76.7 kg)  03/27/18 167 lb 9.6 oz (76 kg)      ASSESSMENT AND PLAN:  # Takotsubo cardiomyopathy:  # Hypertension: Resolved.  She restarted lisinopril.  Given that her BP is not controlled we will not stop metoprolol or lisinopril.  I think her blood pressure is elevated today mostly because her car stopped working this morning and she was stressed trying to get her  appointment.  In general her blood pressure has been well-controlled, if not low.  We will continue to monitor for now.  # Tobacco abuse: [name] is able to quit smoking with patches but then starts back when she  gets stressed.  We will start Wellbutrin and she can continue to use patches as well.  Smoking cessation was discussed for 5 minutes.  # Prior stroke: Continue aspirin.  Discuss lipids at follow up.    Current medicines are reviewed at length with the patient today.  The patient does not have concerns regarding medicines.  The following changes have been made:  Start Wellbutrin.   Labs/ tests ordered today include:  No orders of the defined types were placed in this encounter.    Disposition:   FU with Brittini Brubeck C. Oval Linsey, MD, [facility] in 2 months.      Signed, Mylissa Lambe C. Oval Linsey, MD, [facility]  06/27/2018 10:36 AM    [place]

## 2018-07-03 ENCOUNTER — Other Ambulatory Visit: Payer: Self-pay | Admitting: Cardiovascular Disease

## 2018-07-03 ENCOUNTER — Ambulatory Visit
Admission: RE | Admit: 2018-07-03 | Discharge: 2018-07-03 | Disposition: A | Discharge status: Home / Self Care | Payer: Medicaid Other | Source: Ambulatory Visit | Attending: Hematology and Oncology | Admitting: Hematology and Oncology

## 2018-07-03 DIAGNOSIS — Z853 Personal history of malignant neoplasm of breast: Secondary | ICD-10-CM

## 2018-08-16 ENCOUNTER — Encounter: Payer: Self-pay | Admitting: Cardiovascular Disease

## 2018-08-16 ENCOUNTER — Ambulatory Visit (INDEPENDENT_AMBULATORY_CARE_PROVIDER_SITE_OTHER): Payer: Medicaid Other | Admitting: Cardiovascular Disease

## 2018-08-16 VITALS — BP 126/78 | HR 67 | Ht 66.0 in | Wt 169.0 lb

## 2018-08-16 DIAGNOSIS — Z720 Tobacco use: Secondary | ICD-10-CM

## 2018-08-16 DIAGNOSIS — I10 Essential (primary) hypertension: Secondary | ICD-10-CM | POA: Diagnosis not present

## 2018-08-16 DIAGNOSIS — I5181 Takotsubo syndrome: Secondary | ICD-10-CM | POA: Diagnosis not present

## 2018-08-16 NOTE — Patient Instructions (Signed)
Medication Instructions:  Your physician recommends that you continue on your current medications as directed. Please refer to the Current Medication list given to you today.  If you need a refill on your cardiac medications before your next appointment, please call your pharmacy.   Lab work: FASTING LP/CMET SOON If you have labs (blood work) drawn today and your tests are completely normal, you will receive your results only by: Marland Kitchen MyChart Message (if you have MyChart) OR . A paper copy in the mail If you have any lab test that is abnormal or we need to change your treatment, we will call you to review the results.  Testing/Procedures: NONE  Follow-Up: At [facility], you and your health needs are our priority.  As part of our continuing mission to provide you with exceptional heart care, we have created designated Provider Care Teams.  These Care Teams include your primary Cardiologist (physician) and Advanced Practice Providers (APPs -  Physician Assistants and Nurse Practitioners) who all work together to provide you with the care you need, when you need it. You will need a follow up appointment in 12 months.  Please call our office 2 months in advance to schedule this appointment.  You may see Skeet Latch, MD or one of the following Advanced Practice Providers on your designated Care Team:   Kerin Ransom, PA-C Roby Lofts, [place] . Sande Rives, PA-C  You have been referred to WITH PHARM D FOR LIPID CLINIC AFTER LABS

## 2018-08-16 NOTE — Progress Notes (Signed)
Cardiology Office Note   Date:  08/16/2018   ID:  [name], DOB May 13, 1956, MRN [id number]  PCP:  Chesley Noon, MD  Cardiologist:   Skeet Latch, MD   Chief Complaint  Patient presents with  . Follow-up    2 months.     History of Present Illness: [name] is a 62 y.o. female  with Takatsubo cardiomyopathy, hypertension, hyperlipidemia, prior stroke, ongoing tobacco abuse, and breast cancer status post XRT.    She was admitted 12/2017 with chest pain and was found to have Takotsubo cardiomyopathy.  Echo that admission revealed LVEF 50 to 55% with grade 1 diastolic dysfunction and hypokinesis of the mid to apical inferolateral and inferior myocardium.  She underwent left heart catheterization 12/24/2017 that revealed 10% mid RCA stenosis and LVEF was 25 to 35% by left ventriculography.  She was started on metoprolol and no ACE inhibitor/ARB due to low blood pressure.  She followed up with Kerin Ransom, PA-C and was doing well.  She was started on lisinopril.  She reported cough since starting lisinopril so it was switched to losartan.  However, her cough persisted and the losartan made her feel poorly so she switched back to lisinopril.    Since her last appointment she has been feeling well.  She struggled with bronchitis but is feeling better.  She is back smoking.  She tried Wellbutrin and didn't tolerate it due to a bad taste/smell.  She has only smoked 20 cigarettes in the last 5 weeks and plans to continue cutting back.  She is interested in trying patches.   She is working on her diet and trying to limited fried foods and red meat.  She saw her PCP and notes that her LFTs were elevated.    Past Medical History:  Diagnosis Date  . Adjustment disorder with mixed anxiety and depressed mood 01/05/2013  . Allergy   . Anxiety   . Arthritis    "knees,wrists" (07/31/2014)  . Brain aneurysm 2012   2cms.  . Breast cancer ([place]) 06/05/2013  . Breast disorder    lump in  left breast  . Carcinoid tumor   . Complication of anesthesia    hallucinations , anxiety   . Depression   . H/O echocardiogram    not finding report in EPIC, pt. reports that it was before her appt. /w Dr. Johnsie Cancel   . Hepatitis B dx'd ~ 1980  . Hepatitis C dx'd ~ 2000   "specialist says it cleared itself on it's own"  . Hx of radiation therapy 07/17/13-09/03/13   left breast,60.4Gy, 32/33 fx completed  . Hyperlipidemia   . Hypertension    "never took my RX" (07/31/2014)  . Jaundice   . Malignant neoplasm of lower-outer quadrant of female breast ([place]) 05/19/2013  . Pain management    goes to [facility] clinic, but she doesn't intend to return   . Personal history of radiation therapy    2014  . Stroke [facility]) 2012   TIA  . TIA (transient ischemic attack) 06/2011   "can't see real well out of left eye since; weaker on left side since"  . Varicose veins   . Walking pneumonia ~ 2008    Past Surgical History:  Procedure Laterality Date  . BREAST BIOPSY Left 03/2013; 05/2013  . BREAST LUMPECTOMY Left 04/02/2013   Procedure: Excision  of Left Breast Mass;  Surgeon: Harl Bowie, MD;  Location: [place];  Service: General;  Laterality: Left;  .  BREAST LUMPECTOMY WITH SENTINEL LYMPH NODE BIOPSY Left 06/05/2013   Procedure: BREAST LUMPECTOMY WITH SENTINEL LYMPH NODE BX;  Surgeon: Harl Bowie, MD;  Location: [place];  Service: General;  Laterality: Left;  . CARDIAC CATHETERIZATION  12/24/2017  . COLON SURGERY  03/2013   bowel obstruction , malignant tumor    . DILATION AND CURETTAGE OF UTERUS  ~ 1990   S/P miscarriage  . EVACUATION BREAST HEMATOMA Left 06/05/2013   Procedure: EVACUATION HEMATOMA BREAST;  Surgeon: Harl Bowie, MD;  Location: [place];  Service: General;  Laterality: Left;  . LAPAROSCOPY N/A 04/02/2013   Procedure: Diaganostic Lap.; Small Bowel Resection;  Surgeon: Harl Bowie, MD;  Location: [place];  Service: General;  Laterality: N/A;  . LEFT HEART CATH AND  CORONARY ANGIOGRAPHY N/A 12/24/2017   Procedure: LEFT HEART CATH AND CORONARY ANGIOGRAPHY;  Surgeon: Jettie Booze, MD;  Location: [place] [facility];  Service: Cardiovascular;  Laterality: N/A;  . TUMOR EXCISION  04/02/2013   "carcoid tumor removed"  . vaginal births   26    birthed twins at -[redacted] weeks gestation - both survived , total of 3 pregnancies - all born vaginally     Current Outpatient Medications  Medication Sig Dispense Refill  . acetaminophen (TYLENOL) 500 MG tablet Take 500 mg by mouth 2 (two) times daily as needed (for back and knee pain).     Marland Kitchen ALPRAZolam (XANAX) 1 MG tablet Take 1 tablet (1 mg total) by mouth 2 (two) times daily as needed for anxiety. 45 tablet 0  . aspirin EC 81 MG tablet Take 81 mg by mouth daily.    Marland Kitchen buPROPion (WELLBUTRIN SR) 150 MG 12 hr tablet TAKE ONE DAILY BY MOUTH FOR 3 DAYS AND THEN INCREASE TO 1 TABLET TWICE A DAY FOR A TOTAL OF 12 WEEKS. STOP SMOKING 5-7 DAYS AFTER STARTING 180 tablet 1  . lisinopril (PRINIVIL,ZESTRIL) 10 MG tablet TAKE 1 TABLET BY MOUTH EVERY DAY 30 tablet 11  . Melatonin 10 MG SUBL Place 1 tablet under the tongue at bedtime as needed (sleep).    . metoprolol succinate (TOPROL-XL) 25 MG 24 hr tablet Take 0.5 tablets (12.5 mg total) by mouth daily. 90 tablet 3  . nicotine (NICODERM CQ) 14 mg/24hr patch Place 1 patch (14 mg total) onto the skin daily. 28 patch 0  . nicotine (NICODERM CQ) 7 mg/24hr patch Place 1 patch (7 mg total) onto the skin daily. FOR 2 WEEKS. TO BE USED ONCE YOU COMPLETE THE 1 MONTH TREATMENT 14 patch 0  . nitroGLYCERIN (NITROSTAT) 0.4 MG SL tablet Place 1 tablet (0.4 mg total) under the tongue every 5 (five) minutes x 3 doses as needed for chest pain. 25 tablet 1   No current facility-administered medications for this visit.     Allergies:   Statins; Tramadol; Ace inhibitors; Lisinopril; Prozac [fluoxetine]; Ciprofloxacin hcl; and Sertraline hcl    Social History:  The patient  reports that she has  been smoking cigarettes. She has a 18.00 pack-year smoking history. She has never used smokeless tobacco. She reports that she drinks about 3.0 standard drinks of alcohol per week. She reports that she has current or past drug history. Drug: Marijuana.   Family History:  The patient's family history includes Arrhythmia in her sister; Cancer in her father, mother, and sister; Diabetes in her brother, mother, paternal grandfather, paternal grandmother, and sister; Heart disease in her brother, mother, and sister; Stroke in her brother.  ROS:  Please see the history of present illness.   Otherwise, review of systems are positive for none.   All other systems are reviewed and negative.    PHYSICAL EXAM: VS:  BP 126/78 (BP Location: Right Arm, Patient Position: Sitting, Cuff Size: Normal)   Pulse 67   Ht 5\' 6"  (1.676 m)   Wt 169 lb (76.7 kg)   BMI 27.28 kg/m  , BMI Body mass index is 27.28 kg/m. GENERAL:  Well appearing.  Anxious HEENT: Pupils equal round and reactive, fundi not visualized, oral mucosa unremarkable NECK:  No jugular venous distention, waveform within normal limits, carotid upstroke brisk and symmetric, no bruits, no thyromegaly LUNGS:  Clear to auscultation bilaterally HEART:  RRR.  PMI not displaced or sustained,S1 and S2 within normal limits, no S3, no S4, no clicks, no rubs, no murmurs ABD:  Flat, positive bowel sounds normal in frequency in pitch, no bruits, no rebound, no guarding, no midline pulsatile mass, no hepatomegaly, no splenomegaly EXT:  2 plus pulses throughout, no edema, no cyanosis no clubbing SKIN:  No rashes no nodules NEURO:  Cranial nerves II through XII grossly intact, motor grossly intact throughout PSYCH:  Cognitively intact, oriented to person place and time   EKG:  EKG is not ordered today.   Recent Labs: 12/24/2017: Magnesium 1.9; TSH 1.333 04/27/2018: ALT 22; BUN 14; Creatinine, Ser 0.69; Hemoglobin 13.5; Platelets 269; Potassium 4.5; Sodium  142   Echo 12/24/17: Study Conclusions - Left ventricle: Longitudinal global LV strain is borderlin abnormal at -15% with the abnormality primarly in the inferolateral wall. The cavity size was normal. There was mild focal basal hypertrophy of the septum. Systolic function was normal. The estimated ejection fraction was in the range of 50% to 55%. There is hypokinesis of the mid-apicalinferolateral and inferior myocardium. There was an increased relative contribution of atrial contraction to ventricular filling. Doppler parameters are consistent with abnormal left ventricular relaxation (grade 1 diastolic dysfunction). - Aortic valve: Mildly to moderately calcified annulus. Trileaflet; normal thickness leaflets. There was trivial regurgitation. - Mitral valve: Calcified annulus. - Pulmonic valve: There was trivial regurgitation.  Impressions: - Compared to prior echo, inferior and inferolateral wall motion abnormalities are new.   LHC 12/24/17:  Mid RCA lesion is 10% stenosed.  LV end diastolic pressure is mildly elevated.  There is moderate to severe left ventricular systolic dysfunction in the pattern of Takotsubo cardiomyopathy  The left ventricular ejection fraction is 25-35% by visual estimate.  There is no aortic valve stenosis.   Lipid Panel    Component Value Date/Time   CHOL 200 12/24/2017 0953   TRIG 131 12/24/2017 0953   HDL 76 12/24/2017 0953   CHOLHDL 2.6 12/24/2017 0953   VLDL 26 12/24/2017 0953   LDLCALC 98 12/24/2017 0953      Wt Readings from Last 3 Encounters:  08/16/18 169 lb (76.7 kg)  06/27/18 169 lb (76.7 kg)  04/27/18 169 lb (76.7 kg)      ASSESSMENT AND PLAN:  # Takotsubo cardiomyopathy:  # Hypertension: Systolic function has normalized.  BP controlled on metoprolol and lisinopril.   # Tobacco abuse: Ms. Dekoning is able to quit smoking with patches but then starts back when she gets stressed.  Wellbutrin didn't  work.  She is smoking 4-5/day.  She will try 14mg  patches.   # Prior stroke: Continue aspirin. Check lipids/CMP.    Current medicines are reviewed at length with the patient today.  The patient does not have  concerns regarding medicines.  The following changes have been made:  Start Wellbutrin.   Labs/ tests ordered today include:  No orders of the defined types were placed in this encounter.    Disposition:   FU with Erice Ahles C. Oval Linsey, MD, [facility] in 1 year.      Signed, Nylan Nevel C. Oval Linsey, MD, [facility]  08/16/2018 9:40 AM    [place]

## 2018-09-12 ENCOUNTER — Other Ambulatory Visit: Payer: Self-pay | Admitting: Cardiovascular Disease

## 2018-09-12 ENCOUNTER — Ambulatory Visit: Payer: Medicaid Other

## 2018-09-12 NOTE — Progress Notes (Deleted)
09/12/2018 SHER SHAMPINE 1955/09/27 [id number]   HPI:  [name] is a 63 y.o. female patient of Dr Oval Linsey, who presents today for a lipid clinic evaluation.  In addition to hyperlipidemia, her medical history is significant f or Takasubo cardiomyopathy (EF 50-55%), hypertension, prior stroke (2012), tobacco abuse and prior breast cancer (2014).    Current Medications: none  Cholesterol Goals:  LDL < 70   Intolerant/previously tried: lovastatin 20 mg, pravastatin 20 mg - myalgias  Family history:   Diet:   Exercise:    Labs: 12/2017:  TC 200, TG 131, HDL 76, LDL 98 (no meds)   Current Outpatient Medications  Medication Sig Dispense Refill  . acetaminophen (TYLENOL) 500 MG tablet Take 500 mg by mouth 2 (two) times daily as needed (for back and knee pain).     Marland Kitchen ALPRAZolam (XANAX) 1 MG tablet Take 1 tablet (1 mg total) by mouth 2 (two) times daily as needed for anxiety. 45 tablet 0  . aspirin EC 81 MG tablet Take 81 mg by mouth daily.    Marland Kitchen buPROPion (WELLBUTRIN SR) 150 MG 12 hr tablet TAKE ONE DAILY BY MOUTH FOR 3 DAYS AND THEN INCREASE TO 1 TABLET TWICE A DAY FOR A TOTAL OF 12 WEEKS. STOP SMOKING 5-7 DAYS AFTER STARTING 180 tablet 1  . lisinopril (PRINIVIL,ZESTRIL) 10 MG tablet TAKE 1 TABLET BY MOUTH EVERY DAY 30 tablet 11  . Melatonin 10 MG SUBL Place 1 tablet under the tongue at bedtime as needed (sleep).    . metoprolol succinate (TOPROL-XL) 25 MG 24 hr tablet Take 0.5 tablets (12.5 mg total) by mouth daily. 90 tablet 3  . nicotine (NICODERM CQ) 14 mg/24hr patch Place 1 patch (14 mg total) onto the skin daily. 28 patch 0  . nicotine (NICODERM CQ) 7 mg/24hr patch Place 1 patch (7 mg total) onto the skin daily. FOR 2 WEEKS. TO BE USED ONCE YOU COMPLETE THE 1 MONTH TREATMENT 14 patch 0  . nitroGLYCERIN (NITROSTAT) 0.4 MG SL tablet Place 1 tablet (0.4 mg total) under the tongue every 5 (five) minutes x 3 doses as needed for chest pain. 25 tablet 1   No current  facility-administered medications for this visit.     Allergies  Allergen Reactions  . Statins Swelling and Other (See Comments)    Stomach swelling, muscle aches, and abdominal pain   . Tramadol Anxiety and Other (See Comments)    Extreme muscle pain and agitation    . Ace Inhibitors Other (See Comments)    Palpitations  . Lisinopril     COUGH   . Prozac [Fluoxetine] Other (See Comments)    Causes the patient to be JITTERY and WEEPY  . Wellbutrin [Bupropion]     Agitation, insomnia  . Ciprofloxacin Hcl Palpitations  . Sertraline Hcl Diarrhea    Past Medical History:  Diagnosis Date  . Adjustment disorder with mixed anxiety and depressed mood 01/05/2013  . Allergy   . Anxiety   . Arthritis    "knees,wrists" (07/31/2014)  . Brain aneurysm 2012   2cms.  . Breast cancer ([place]) 06/05/2013  . Breast disorder    lump in left breast  . Carcinoid tumor   . Complication of anesthesia    hallucinations , anxiety   . Depression   . H/O echocardiogram    not finding report in EPIC, pt. reports that it was before her appt. /w Dr. Johnsie Cancel   . Hepatitis B dx'd ~ 1980  . Hepatitis  C dx'd ~ 2000   "specialist says it cleared itself on it's own"  . Hx of radiation therapy 07/17/13-09/03/13   left breast,60.4Gy, 32/33 fx completed  . Hyperlipidemia   . Hypertension    "never took my RX" (07/31/2014)  . Jaundice   . Malignant neoplasm of lower-outer quadrant of female breast ([place]) 05/19/2013  . Pain management    goes to [facility] clinic, but she doesn't intend to return   . Personal history of radiation therapy    2014  . Stroke [facility]) 2012   TIA  . TIA (transient ischemic attack) 06/2011   "can't see real well out of left eye since; weaker on left side since"  . Varicose veins   . Walking pneumonia ~ 2008    There were no vitals taken for this visit.   No problem-specific Assessment & Plan notes found for this encounter.   Tommy Medal PharmD CPP [facility]

## 2018-09-12 NOTE — Telephone Encounter (Signed)
New Message    *STAT* If patient is at the pharmacy, call can be transferred to refill team.   1. Which medications need to be refilled? (please list name of each medication and dose if known) nicotine (NICODERM CQ) 14 mg/24hr patch   2. Which pharmacy/location (including street and city if local pharmacy) is medication to be sent to? [id number] - [place], [place] - [address] AT [place]  3. Do they need a 30 day or 90 day supply? [place]

## 2018-09-19 ENCOUNTER — Ambulatory Visit: Payer: Medicaid Other

## 2018-09-24 MED ORDER — NICOTINE 14 MG/24HR TD PT24: 14.0000 mg | MEDICATED_PATCH | Freq: Every day | TRANSDERMAL | 0 refills | Status: DC

## 2018-09-26 ENCOUNTER — Ambulatory Visit (INDEPENDENT_AMBULATORY_CARE_PROVIDER_SITE_OTHER): Payer: Medicaid Other | Admitting: Pharmacist

## 2018-09-26 VITALS — BP 138/80 | HR 68 | Ht 66.0 in | Wt 171.0 lb

## 2018-09-26 DIAGNOSIS — E785 Hyperlipidemia, unspecified: Secondary | ICD-10-CM | POA: Diagnosis not present

## 2018-09-26 LAB — COMPREHENSIVE METABOLIC PANEL
ALBUMIN: 4.4 g/dL (ref 3.6–4.8)
ALT: 18 IU/L (ref 0–32)
AST: 17 IU/L (ref 0–40)
Albumin/Globulin Ratio: 1.8 (ref 1.2–2.2)
Alkaline Phosphatase: 71 IU/L (ref 39–117)
BUN / CREAT RATIO: 15 (ref 12–28)
BUN: 11 mg/dL (ref 8–27)
Bilirubin Total: 0.5 mg/dL (ref 0.0–1.2)
CALCIUM: 9.7 mg/dL (ref 8.7–10.3)
CO2: 21 mmol/L (ref 20–29)
CREATININE: 0.73 mg/dL (ref 0.57–1.00)
Chloride: 104 mmol/L (ref 96–106)
GFR calc Af Amer: 102 mL/min/{1.73_m2} (ref >59)
GFR, EST NON AFRICAN AMERICAN: 89 mL/min/{1.73_m2} (ref >59)
GLOBULIN, TOTAL: 2.4 g/dL (ref 1.5–4.5)
Glucose: 94 mg/dL (ref 65–99)
Potassium: 4.8 mmol/L (ref 3.5–5.2)
SODIUM: 141 mmol/L (ref 134–144)
Total Protein: 6.8 g/dL (ref 6.0–8.5)

## 2018-09-26 LAB — LIPID PANEL
CHOLESTEROL TOTAL: 251 mg/dL — AB (ref 100–199)
Chol/HDL Ratio: 3.2 ratio (ref 0.0–4.4)
HDL: 79 mg/dL (ref >39)
LDL Calculated: 137 mg/dL — ABNORMAL HIGH (ref 0–99)
Triglycerides: 173 mg/dL — ABNORMAL HIGH (ref 0–149)
VLDL Cholesterol Cal: 35 mg/dL (ref 5–40)

## 2018-09-26 NOTE — Progress Notes (Signed)
Patient ID: [name]                 DOB: 07/10/1956                    MRN: [id number]     HPI: [name] is a 63 y.o. female patient referred to lipid clinic by Dr [name]. PMH is significant for Takatsubo cardiomyopathy, cancer, TIA, hypertension, hyperlipidemia, tobacco abuse and intolerance to multiple medication. Patient presents to clinic for potential PCSK9i initiation.   Current Medications: none  Intolerances:  Ezetimibe 10mg  daily rosuvastatin - severe cramps Lovastatin 20mg  daily - severe cramps Pravastatin 20mg  daily  LDL goal: 70mg /dL  Diet: no extract diet, reach in cheese and other high fat foods  Exercise: activities of daily living  Family History: The patient's family history includes Arrhythmia in her sister; Cancer in her father, mother, and sister; Diabetes in her brother, mother, paternal grandfather, paternal grandmother, and sister; Heart disease in her brother, mother, and sister; Stroke in her brother.  Social History: The patient  reports that she has been smoking cigarettes. She has a 18.00 pack-year smoking history. She has never used smokeless tobacco. She reports current alcohol use of about 3.0 standard drinks of alcohol per week. She reports current drug use. Drug: Marijuana.  Labs: 09/26/2018: CHO 251, TG 173, HDL 79, LDL-c 137   Past Medical History:  Diagnosis Date  . Adjustment disorder with mixed anxiety and depressed mood 01/05/2013  . Allergy   . Anxiety   . Arthritis    "knees,wrists" (07/31/2014)  . Brain aneurysm 2012   2cms.  . Breast cancer ([place]) 06/05/2013  . Breast disorder    lump in left breast  . Carcinoid tumor   . Complication of anesthesia    hallucinations , anxiety   . Depression   . H/O echocardiogram    not finding report in EPIC, pt. reports that it was before her appt. /w Dr. Johnsie Cohen   . Hepatitis B dx'd ~ 1980  . Hepatitis C dx'd ~ 2000   "specialist says it cleared itself on it's own"  . Hx of  radiation therapy 07/17/13-09/03/13   left breast,60.4Gy, 32/33 fx completed  . Hyperlipidemia   . Hypertension    "never took my RX" (07/31/2014)  . Jaundice   . Malignant neoplasm of lower-outer quadrant of female breast ([place]) 05/19/2013  . Pain management    goes to [facility] clinic, but she doesn't intend to return   . Personal history of radiation therapy    2014  . Stroke [name]) 2012   TIA  . TIA (transient ischemic attack) 06/2011   "can't see real well out of left eye since; weaker on left side since"  . Varicose veins   . Walking pneumonia ~ 2008    Current Outpatient Medications on File Prior to Visit  Medication Sig Dispense Refill  . acetaminophen (TYLENOL) 500 MG tablet Take 500 mg by mouth 2 (two) times daily as needed (for back and knee pain).     Marland Kitchen ALPRAZolam (XANAX) 1 MG tablet Take 1 tablet (1 mg total) by mouth 2 (two) times daily as needed for anxiety. 45 tablet 0  . aspirin EC 81 MG tablet Take 81 mg by mouth daily.    . Melatonin 10 MG SUBL Place 1 tablet under the tongue at bedtime as needed (sleep).    . nicotine (NICODERM CQ) 7 mg/24hr patch Place 1 patch (7 mg total) onto  the skin daily. FOR 2 WEEKS. TO BE USED ONCE YOU COMPLETE THE 1 MONTH TREATMENT 14 patch 0  . nitroGLYCERIN (NITROSTAT) 0.4 MG SL tablet Place 1 tablet (0.4 mg total) under the tongue every 5 (five) minutes x 3 doses as needed for chest pain. 25 tablet 1   No current facility-administered medications on file prior to visit.     Allergies  Allergen Reactions  . Statins Swelling and Other (See Comments)    Stomach swelling, muscle aches, and abdominal pain   . Tramadol Anxiety and Other (See Comments)    Extreme muscle pain and agitation    . Ace Inhibitors Other (See Comments)    Palpitations  . Lisinopril     COUGH   . Prozac [Fluoxetine] Other (See Comments)    Causes the patient to be JITTERY and WEEPY  . Wellbutrin [Bupropion]     Agitation, insomnia  . Ciprofloxacin Hcl  Palpitations  . Sertraline Hcl Diarrhea    Hyperlipidemia LDL-c above goal for secondary prevention  On patient with history of stroke. Patient refused trial with low dose statin or with PCSK9i. Request follow up with MD or APP for chest discomfort and elevated HR. Her preference if to control cholesterol with diet, exercise and stoking cessation. Refused Rx for Chantix as well. Two weeks follow up appointmet made with NP. NO medication changes today.    [name] PharmD, BCPS, [place] [address] [place] [address] 10/01/2018 4:55 PM

## 2018-09-26 NOTE — Patient Instructions (Addendum)
[facility] (pharmacist) Carlen Fils/Kristin [phone]  *Office visit with NP in 1 week* *Blood work today 09/26/2018*    Cholesterol  Cholesterol is a fat. Your body needs a small amount of cholesterol. Cholesterol (plaque) may build up in your blood vessels (arteries). That makes you more likely to have a heart attack or stroke. You cannot feel your cholesterol level. Having a blood test is the only way to find out if your level is high. Keep your test results. Work with your doctor to keep your cholesterol at a good level. What do the results mean?  Total cholesterol is how much cholesterol is in your blood.  LDL is bad cholesterol. This is the type that can build up. Try to have low LDL.  HDL is good cholesterol. It cleans your blood vessels and carries LDL away. Try to have high HDL.  Triglycerides are fat that the body can store or burn for energy. What are good levels of cholesterol?  Total cholesterol below 200.  LDL below 100 is good for people who have health risks. LDL below 70 is good for people who have very high risks.  HDL above 40 is good. It is best to have HDL of 60 or higher.  Triglycerides below 150. How can I lower my cholesterol? Diet Follow your diet program as told by your doctor.  Choose fish, white meat chicken, or [place] that is roasted or baked. Try not to eat red meat, fried foods, sausage, or lunch meats.  Eat lots of fresh fruits and vegetables.  Choose whole grains, beans, pasta, potatoes, and cereals.  Choose olive oil, corn oil, or canola oil. Only use small amounts.  Try not to eat butter, mayonnaise, shortening, or palm kernel oils.  Try not to eat foods with trans fats.  Choose low-fat or nonfat dairy foods. ? Drink skim or nonfat milk. ? Eat low-fat or nonfat yogurt and cheeses. ? Try not to drink whole milk or cream. ? Try not to eat ice cream, egg yolks, or full-fat cheeses.  Healthy desserts include angel food cake, ginger  snaps, animal crackers, hard candy, popsicles, and low-fat or nonfat frozen yogurt. Try not to eat pastries, cakes, pies, and cookies.  Exercise Follow your exercise program as told by your doctor.  Be more active. Try gardening, walking, and taking the stairs.  Ask your doctor about ways that you can be more active. Medicine  Take over-the-counter and prescription medicines only as told by your doctor. This information is not intended to replace advice given to you by your health care provider. Make sure you discuss any questions you have with your health care provider. Document Released: 11/24/2008 Document Revised: 03/29/2016 Document Reviewed: 03/09/2016 Elsevier Interactive Patient Education  2019 Elsevier Inc.    Preventing High Cholesterol Cholesterol is a waxy, fat-like substance that your body needs in small amounts. Your liver makes all the cholesterol that your body needs. Having high cholesterol (hypercholesterolemia) increases your risk for heart disease and stroke. Extra (excess) cholesterol comes from the food you eat, such as animal-based fat (saturated fat) from meat and some dairy products. High cholesterol can often be prevented with diet and lifestyle changes. If you already have high cholesterol, you can control it with diet and lifestyle changes, as well as medicine. What nutrition changes can be made?  Eat less saturated fat. Foods that contain saturated fat include red meat and some dairy products.  Avoid processed meats, like bacon and lunch meats.  Avoid trans fats,  which are found in margarine and some baked goods.  Avoid foods and beverages that have added sugars.  Eat more fruits, vegetables, and whole grains.  Choose healthy sources of protein, such as fish, poultry, and nuts.  Choose healthy sources of fat, such as: ? Nuts. ? Vegetable oils, especially olive oil. ? Fish that have healthy fats (omega-3 fatty acids), such as mackerel or salmon. What  lifestyle changes can be made?   Lose weight if you are overweight. Losing 5-10 lb (2.3-4.5 kg) can help prevent or control high cholesterol and reduce your risk for diabetes and high blood pressure. Ask your health care provider to help you with a diet and exercise plan to safely lose weight.  Get enough exercise. Do at least 150 minutes of moderate-intensity exercise each week. ? You could do this in short exercise sessions several times a day, or you could do longer exercise sessions a few times a week. For example, you could take a brisk 10-minute walk or bike ride, 3 times a day, for 5 days a week.  Do not smoke. If you need help quitting, ask your health care provider.  Limit your alcohol intake. If you drink alcohol, limit alcohol intake to no more than 1 drink a day for nonpregnant women and 2 drinks a day for men. One drink equals 12 oz of beer, 5 oz of wine, or 1 oz of hard liquor. Why are these changes important?  If you have high cholesterol, deposits (plaques) may build up on the walls of your blood vessels. Plaques make the arteries narrower and stiffer, which can restrict or block blood flow and cause blood clots to form. This greatly increases your risk for heart attack and stroke. Making diet and lifestyle changes can reduce your risk for these life-threatening conditions. What can I do to lower my risk?  Manage your risk factors for high cholesterol. Talk with your health care provider about all of your risk factors and how to lower your risk.  Manage other conditions that you have, such as diabetes or high blood pressure (hypertension).  Have your cholesterol checked at regular intervals.  Keep all follow-up visits as told by your health care provider. This is important. How is this treated? In addition to diet and lifestyle changes, your health care provider may recommend medicines to help lower cholesterol, such as a medicine to reduce the amount of cholesterol made in  your liver. You may need medicine if:  Diet and lifestyle changes do not lower your cholesterol enough.  You have high cholesterol and other risk factors for heart disease or stroke. Take over-the-counter and prescription medicines only as told by your health care provider. Where to find more information  American Heart Association: ThisTune.com.pt.jsp  National Heart, Lung, and Blood Institute: FrenchToiletries.com.cy Summary  High cholesterol increases your risk for heart disease and stroke. By keeping your cholesterol level low, you can reduce your risk for these conditions.  Diet and lifestyle changes are the most important steps in preventing high cholesterol.  Work with your health care provider to manage your risk factors, and have your blood tested regularly. This information is not intended to replace advice given to you by your health care provider. Make sure you discuss any questions you have with your health care provider. Document Released: 09/12/2015 Document Revised: 05/06/2016 Document Reviewed: 05/06/2016 Elsevier Interactive Patient Education  2019 [employer].

## 2018-09-30 NOTE — Progress Notes (Signed)
Cardiology Office Note   Date:  10/01/2018   ID:  [name], [name] April 02, 1956, MRN [id number]  PCP:  Chesley Noon, MD  Cardiologist: Oval Linsey  Chief Complaint  Patient presents with  . Follow-up    pt c/o chest discomfort  . Shortness of Breath     History of Present Illness: [name] is a 63 y.o. female who presents for ongoing assessment and management of Takotsubo cardiomyopathy, HTN, HL, hx of CVA, ongoing tobacco abuse, and breast cancer s/p XRT.    She had an admission in 12/2017 for chest pain, cardiac cath that revealed 10% mid RCA stenosis and LVEF was 25 to 35% by left ventriculography. Echo demonstrated LVEF of 50%-55%. She is on ACE and BB.   When last seen by Dr. Oval Linsey on 08/16/2018 she was stable from CV standpoint. She was in the process of quitting smoking. Her BP was well controlled. She was to be seen in the lipid clinic by Pharm D on 09/26/2018 due to statin intolerance.   She comes today without complaints. She has not heard back from he recent lipid profile. She is uncomfortable with using a PCSK 9 inhibitor at this time. She prefers to change her diet and quit smoking first.   She has been using nicotine patches for assistance with smoking cessation is an finding them to be helpful. She has not completely stopped yet.   Past Medical History:  Diagnosis Date  . Adjustment disorder with mixed anxiety and depressed mood 01/05/2013  . Allergy   . Anxiety   . Arthritis    "knees,wrists" (07/31/2014)  . Brain aneurysm 2012   2cms.  . Breast cancer ([place]) 06/05/2013  . Breast disorder    lump in left breast  . Carcinoid tumor   . Complication of anesthesia    hallucinations , anxiety   . Depression   . H/O echocardiogram    not finding report in EPIC, pt. reports that it was before her appt. /w Dr. Johnsie Cancel   . Hepatitis B dx'd ~ 1980  . Hepatitis C dx'd ~ 2000   "specialist says it cleared itself on it's own"  . Hx of radiation therapy  07/17/13-09/03/13   left breast,60.4Gy, 32/33 fx completed  . Hyperlipidemia   . Hypertension    "never took my RX" (07/31/2014)  . Jaundice   . Malignant neoplasm of lower-outer quadrant of female breast ([place]) 05/19/2013  . Pain management    goes to [facility] clinic, but she doesn't intend to return   . Personal history of radiation therapy    2014  . Stroke [facility]) 2012   TIA  . TIA (transient ischemic attack) 06/2011   "can't see real well out of left eye since; weaker on left side since"  . Varicose veins   . Walking pneumonia ~ 2008    Past Surgical History:  Procedure Laterality Date  . BREAST BIOPSY Left 03/2013; 05/2013  . BREAST LUMPECTOMY Left 04/02/2013   Procedure: Excision  of Left Breast Mass;  Surgeon: Harl Bowie, MD;  Location: [place];  Service: General;  Laterality: Left;  . BREAST LUMPECTOMY WITH SENTINEL LYMPH NODE BIOPSY Left 06/05/2013   Procedure: BREAST LUMPECTOMY WITH SENTINEL LYMPH NODE BX;  Surgeon: Harl Bowie, MD;  Location: [place];  Service: General;  Laterality: Left;  . CARDIAC CATHETERIZATION  12/24/2017  . COLON SURGERY  03/2013   bowel obstruction , malignant tumor    . DILATION AND CURETTAGE OF  UTERUS  ~ 1990   S/P miscarriage  . EVACUATION BREAST HEMATOMA Left 06/05/2013   Procedure: EVACUATION HEMATOMA BREAST;  Surgeon: Harl Bowie, MD;  Location: [place];  Service: General;  Laterality: Left;  . LAPAROSCOPY N/A 04/02/2013   Procedure: Diaganostic Lap.; Small Bowel Resection;  Surgeon: Harl Bowie, MD;  Location: [place];  Service: General;  Laterality: N/A;  . LEFT HEART CATH AND CORONARY ANGIOGRAPHY N/A 12/24/2017   Procedure: LEFT HEART CATH AND CORONARY ANGIOGRAPHY;  Surgeon: Jettie Booze, MD;  Location: [place] [facility];  Service: Cardiovascular;  Laterality: N/A;  . TUMOR EXCISION  04/02/2013   "carcoid tumor removed"  . vaginal births   31    birthed twins at -[redacted] weeks gestation - both survived , total of 3  pregnancies - all born vaginally     Current Outpatient Medications  Medication Sig Dispense Refill  . acetaminophen (TYLENOL) 500 MG tablet Take 500 mg by mouth 2 (two) times daily as needed (for back and knee pain).     Marland Kitchen ALPRAZolam (XANAX) 1 MG tablet Take 1 tablet (1 mg total) by mouth 2 (two) times daily as needed for anxiety. 45 tablet 0  . aspirin EC 81 MG tablet Take 81 mg by mouth daily.    Marland Kitchen lisinopril (PRINIVIL,ZESTRIL) 10 MG tablet Take 1 tablet (10 mg total) by mouth daily. 90 tablet 1  . Melatonin 10 MG SUBL Place 1 tablet under the tongue at bedtime as needed (sleep).    . metoprolol succinate (TOPROL-XL) 25 MG 24 hr tablet Take 0.5 tablets (12.5 mg total) by mouth daily. 45 tablet 1  . nicotine (NICODERM CQ) 7 mg/24hr patch Place 1 patch (7 mg total) onto the skin daily. FOR 2 WEEKS. TO BE USED ONCE YOU COMPLETE THE 1 MONTH TREATMENT 14 patch 0  . nitroGLYCERIN (NITROSTAT) 0.4 MG SL tablet Place 1 tablet (0.4 mg total) under the tongue every 5 (five) minutes x 3 doses as needed for chest pain. 25 tablet 1   No current facility-administered medications for this visit.     Allergies:   Statins; Tramadol; Ace inhibitors; Lisinopril; Prozac [fluoxetine]; Wellbutrin [bupropion]; Ciprofloxacin hcl; and Sertraline hcl    Social History:  The patient  reports that she has been smoking cigarettes. She has a 18.00 pack-year smoking history. She has never used smokeless tobacco. She reports current alcohol use of about 3.0 standard drinks of alcohol per week. She reports current drug use. Drug: Marijuana.   Family History:  The patient's family history includes Arrhythmia in her sister; Cancer in her father, mother, and sister; Diabetes in her brother, mother, paternal grandfather, paternal grandmother, and sister; Heart disease in her brother, mother, and sister; Stroke in her brother.    ROS: All other systems are reviewed and negative. Unless otherwise mentioned in H&P     PHYSICAL EXAM: VS:  BP 129/78   Pulse 68   Ht 5\' 6"  (1.676 m)   Wt 172 lb 9.6 oz (78.3 kg)   BMI 27.86 kg/m  , BMI Body mass index is 27.86 kg/m. GEN: Well nourished, well developed, in no acute distress HEENT: normal Neck: no JVD, carotid bruits, or masses Cardiac:RRR; no murmurs, rubs, or gallops,no edema  Respiratory:  Clear to auscultation bilaterally, normal work of breathing GI: soft, nontender, nondistended, + BS MS: no deformity or atrophy Skin: warm and dry, no rash Neuro:  Strength and sensation are intact Psych: euthymic mood, full affect   EKG:  NSR rate of 68 bpm.   Recent Labs: 12/24/2017: Magnesium 1.9; TSH 1.333 04/27/2018: Hemoglobin 13.5; Platelets 269 09/26/2018: ALT 18; BUN 11; Creatinine, Ser 0.73; Potassium 4.8; Sodium 141    Lipid Panel    Component Value Date/Time   CHOL 251 (H) 09/26/2018 0943   TRIG 173 (H) 09/26/2018 0943   HDL 79 09/26/2018 0943   CHOLHDL 3.2 09/26/2018 0943   CHOLHDL 2.6 12/24/2017 0953   VLDL 26 12/24/2017 0953   LDLCALC 137 (H) 09/26/2018 0943      Wt Readings from Last 3 Encounters:  10/01/18 172 lb 9.6 oz (78.3 kg)  09/26/18 171 lb (77.6 kg)  08/16/18 169 lb (76.7 kg)      Other studies Reviewed: Echo 12/24/17: Study Conclusions - Left ventricle: Longitudinal global LV strain is borderlin abnormal at -15% with the abnormality primarly in the inferolateral wall. The cavity size was normal. There was mild focal basal hypertrophy of the septum. Systolic function was normal. The estimated ejection fraction was in the range of 50% to 55%. There is hypokinesis of the mid-apicalinferolateral and inferior myocardium. There was an increased relative contribution of atrial contraction to ventricular filling. Doppler parameters are consistent with abnormal left ventricular relaxation (grade 1 diastolic dysfunction). - Aortic valve: Mildly to moderately calcified annulus. Trileaflet; normal  thickness leaflets. There was trivial regurgitation. - Mitral valve: Calcified annulus. - Pulmonic valve: There was trivial regurgitation.  Impressions: - Compared to prior echo, inferior and inferolateral wall motion abnormalities are new.   LHC 12/24/17:  Mid RCA lesion is 10% stenosed.  LV end diastolic pressure is mildly elevated.  There is moderate to severe left ventricular systolic dysfunction in the pattern of Takotsubo cardiomyopathy  The left ventricular ejection fraction is 25-35% by visual estimate.  There is no aortic valve stenosis.  ASSESSMENT AND PLAN:  1.  Hyperlipidemia: She is not at goal level at this time.She is intolerant of statins. She is not comfortable with PCSK9 inhibitors. She prefers to change her diet and quit smoking.   I have reviewed her labs with her, and given her a copy of the labs. She may have a component of familial hyperlipidemia. I have advised her to reconsider use of medications for control.   2. Hypertension: BP is well controlled currently. No changes in her regimen.   3. Tobacco abuse:  She continues to struggle with this, and is using nicotine patches to assist her with cessation. I have given her encouragement with continuing to stop.    Current medicines are reviewed at length with the patient today.    Labs/ tests ordered today include: None   Phill Myron. West Pugh, ANP, AACC   10/01/2018 12:04 PM    [facility] [place] [address] Office [phone] Fax [phone]

## 2018-10-01 ENCOUNTER — Encounter: Payer: Self-pay | Admitting: Adult Health

## 2018-10-01 ENCOUNTER — Encounter: Payer: Self-pay | Admitting: Pharmacist

## 2018-10-01 ENCOUNTER — Ambulatory Visit (INDEPENDENT_AMBULATORY_CARE_PROVIDER_SITE_OTHER): Payer: Medicaid Other | Admitting: Adult Health

## 2018-10-01 VITALS — BP 129/78 | HR 68 | Ht 66.0 in | Wt 172.6 lb

## 2018-10-01 DIAGNOSIS — Z720 Tobacco use: Secondary | ICD-10-CM | POA: Diagnosis not present

## 2018-10-01 DIAGNOSIS — I10 Essential (primary) hypertension: Secondary | ICD-10-CM | POA: Diagnosis not present

## 2018-10-01 DIAGNOSIS — E7800 Pure hypercholesterolemia, unspecified: Secondary | ICD-10-CM

## 2018-10-01 DIAGNOSIS — R079 Chest pain, unspecified: Secondary | ICD-10-CM

## 2018-10-01 MED ORDER — LISINOPRIL 10 MG PO TABS: 10.0000 mg | ORAL_TABLET | Freq: Every day | ORAL | 1 refills | Status: DC

## 2018-10-01 MED ORDER — METOPROLOL SUCCINATE ER 25 MG PO TB24: 12.5000 mg | ORAL_TABLET | Freq: Every day | ORAL | 1 refills | Status: DC

## 2018-10-01 NOTE — Patient Instructions (Signed)
Follow-Up: You will need a follow up appointment in 6 months.  Please call our office 2 months in advance (MAY 2020) to schedule this appointment (July 2020).  You may see Skeet Latch, MD or one of the following Advanced Practice Providers on your designated Care Team:  Kerin Ransom, [place] Roby Lofts, PA-C   Sande Rives, [place]      Medication Instructions:  NO CHANGES- Your physician recommends that you continue on your current medications as directed. Please refer to the Current Medication list given to you today. If you need a refill on your cardiac medications before your next appointment, please call your pharmacy. Labwork: When you have labs (blood work) and your tests are completely normal, you will receive your results ONLY by [place] (if you have MyChart) -OR- A paper copy in the mail.  At [facility], you and your health needs are our priority.  As part of our continuing mission to provide you with exceptional heart care, we have created designated Provider Care Teams.  These Care Teams include your primary Cardiologist (physician) and Advanced Practice Providers (APPs -  Physician Assistants and Nurse Practitioners) who all work together to provide you with the care you need, when you need it.  Thank you for choosing [facility] at [facility]!!

## 2018-10-01 NOTE — Assessment & Plan Note (Signed)
LDL-c above goal for secondary prevention  On patient with history of stroke. Patient refused trial with low dose statin or with PCSK9i. Request follow up with MD or APP for chest discomfort and elevated HR. Her preference if to control cholesterol with diet, exercise and stoking cessation. Refused Rx for Chantix as well. Two weeks follow up appointmet made with NP. NO medication changes today.

## 2018-10-03 ENCOUNTER — Other Ambulatory Visit: Payer: Self-pay

## 2018-10-03 ENCOUNTER — Encounter (HOSPITAL_COMMUNITY): Payer: Self-pay

## 2018-10-03 ENCOUNTER — Ambulatory Visit (HOSPITAL_COMMUNITY)
Admission: EM | Admit: 2018-10-03 | Discharge: 2018-10-03 | Disposition: A | Discharge status: Home / Self Care | Payer: Medicaid Other | Attending: Internal Medicine | Admitting: Internal Medicine

## 2018-10-03 DIAGNOSIS — J111 Influenza due to unidentified influenza virus with other respiratory manifestations: Secondary | ICD-10-CM

## 2018-10-03 MED ORDER — OSELTAMIVIR PHOSPHATE 75 MG PO CAPS: 75.0000 mg | ORAL_CAPSULE | Freq: Two times a day (BID) | ORAL | 0 refills | Status: DC

## 2018-10-03 NOTE — ED Triage Notes (Signed)
Pt cc cough,  fever , headache and nausea. Pt has been vomiting. Pt has been exposed to Flu and pneumonia at home. X 2 days

## 2018-10-03 NOTE — ED Provider Notes (Signed)
[facility] [place]    CSN: [id number] Arrival date & time: 10/03/18  1696     History   Chief Complaint Chief Complaint  Patient presents with  . Cough    HPI Layloni B Saur is a 63 y.o. female with a history of Takotsubo cardiomyopathy,controlled hypertension comes to the urgent care department with a 2-day history of fever, cough, sore throat of 2 days duration.  Patient symptoms started fairly rapidly and is gotten progressively worse.  She had 2 episodes of emesis which was nonbloody and nonbilious.  She had some abdominal cramping as well as nonbloody diarrhea.  She had one bout of diarrhea.  She admits to having a cough which is not productive of sputum.  No aggravating factors for the cough.  No relieving factors.  Cough is associated with sore throat, fever and musculoskeletal type chest pain.  Patient had dizziness sometime this morning but currently not dizzy.  Patient niece was recently diagnosed with influenza.  The patient admits to sick contact with her niece over the past weekend.  HPI  Past Medical History:  Diagnosis Date  . Adjustment disorder with mixed anxiety and depressed mood 01/05/2013  . Allergy   . Anxiety   . Arthritis    "knees,wrists" (07/31/2014)  . Brain aneurysm 2012   2cms.  . Breast cancer ([place]) 06/05/2013  . Breast disorder    lump in left breast  . Carcinoid tumor   . Complication of anesthesia    hallucinations , anxiety   . Depression   . H/O echocardiogram    not finding report in EPIC, pt. reports that it was before her appt. /w Dr. Johnsie Cancel   . Hepatitis B dx'd ~ 1980  . Hepatitis C dx'd ~ 2000   "specialist says it cleared itself on it's own"  . Hx of radiation therapy 07/17/13-09/03/13   left breast,60.4Gy, 32/33 fx completed  . Hyperlipidemia   . Hypertension    "never took my RX" (07/31/2014)  . Jaundice   . Malignant neoplasm of lower-outer quadrant of female breast ([place]) 05/19/2013  . Pain management    goes to [facility]  clinic, but she doesn't intend to return   . Personal history of radiation therapy    2014  . Stroke [facility]) 2012   TIA  . TIA (transient ischemic attack) 06/2011   "can't see real well out of left eye since; weaker on left side since"  . Varicose veins   . Walking pneumonia ~ 2008    Patient Active Problem List   Diagnosis Date Noted  . Takotsubo cardiomyopathy 12/25/2017  . Cerebrovascular accident (CVA) ([place])   . 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 Elevated troponin   . NSTEMI (non-ST elevated myocardial infarction) ([place]) 12/23/2017  . Head lice infestation 78/93/8101  . Chest pain 07/31/2014  . 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 Partial small bowel obstruction ([place]) 04/07/2013  . SBO (small bowel obstruction) ([place]) 01/05/2013  . Adjustment disorder with mixed anxiety and depressed mood 01/05/2013  . Chronic pain syndrome 01/05/2013  . Thyroid cyst 01/05/2013  . Tobacco abuse 12/05/2012  . Hepatitis C   . Jaundice   . Hyperlipidemia   . Varicose veins   . Brain aneurysm   . TIA (transient ischemic attack) 06/12/2011    Past Surgical History:  Procedure Laterality Date  . BREAST BIOPSY Left 03/2013; 05/2013  .  BREAST LUMPECTOMY Left 04/02/2013   Procedure: Excision  of Left Breast Mass;  Surgeon: Harl Bowie, MD;  Location: [facility];  Service: General;  Laterality: Left;  . BREAST LUMPECTOMY WITH SENTINEL LYMPH NODE BIOPSY Left 06/05/2013   Procedure: BREAST LUMPECTOMY WITH SENTINEL LYMPH NODE BX;  Surgeon: Harl Bowie, MD;  Location: [facility];  Service: General;  Laterality: Left;  . CARDIAC CATHETERIZATION  12/24/2017  . COLON SURGERY  03/2013   bowel obstruction , malignant tumor    . DILATION AND CURETTAGE OF UTERUS  ~ 1990   S/P miscarriage  . EVACUATION BREAST HEMATOMA Left 06/05/2013   Procedure: EVACUATION HEMATOMA BREAST;  Surgeon: Harl Bowie, MD;  Location: [facility];  Service: General;  Laterality: Left;  . LAPAROSCOPY N/A 04/02/2013   Procedure: Diaganostic Lap.; Small Bowel Resection;  Surgeon: Harl Bowie, MD;  Location: [facility];  Service: General;  Laterality: N/A;  . LEFT HEART CATH AND CORONARY ANGIOGRAPHY N/A 12/24/2017   Procedure: LEFT HEART CATH AND CORONARY ANGIOGRAPHY;  Surgeon: Jettie Booze, MD;  Location: [facility];  Service: Cardiovascular;  Laterality: N/A;  . TUMOR EXCISION  04/02/2013   "carcoid tumor removed"  . vaginal births   35    birthed twins at -[redacted] weeks gestation - both survived , total of 3 pregnancies - all born vaginally    OB History    Gravida  5   Para  4   Term      Preterm  2   AB  1   Living  4     SAB      TAB  1   Ectopic      Multiple      Live Births               Home Medications    Prior to Admission medications   Medication Sig Start Date End Date Taking? Authorizing Provider  acetaminophen (TYLENOL) 500 MG tablet Take 500 mg by mouth 2 (two) times daily as needed (for back and knee pain).     [provider]  ALPRAZolam Duanne Moron) 1 MG tablet Take 1 tablet (1 mg total) by mouth 2 (two) times daily as needed for anxiety. 06/04/15   Nicholas Lose, MD  aspirin EC 81 MG tablet Take 81 mg by mouth daily.    [provider]  lisinopril (PRINIVIL,ZESTRIL) 10 MG tablet Take 1 tablet (10 mg total) by mouth daily. 10/01/18   Lendon Colonel, NP  Melatonin 10 MG SUBL Place 1 tablet under the tongue at bedtime as needed (sleep).    [provider]  metoprolol succinate (TOPROL-XL) 25 MG 24 hr tablet Take 0.5 tablets (12.5 mg total) by mouth daily. 10/01/18   Lendon Colonel, NP  nicotine (NICODERM CQ) 7 mg/24hr patch Place 1 patch (7 mg total) onto the skin daily. FOR 2 WEEKS. TO BE USED ONCE YOU COMPLETE THE 1 MONTH TREATMENT 03/27/18   Skeet Latch, MD  nitroGLYCERIN (NITROSTAT) 0.4 MG SL tablet Place 1 tablet  (0.4 mg total) under the tongue every 5 (five) minutes x 3 doses as needed for chest pain. 12/25/17   Duke, Tami Lin, PA  oseltamivir (TAMIFLU) 75 MG capsule Take 1 capsule (75 mg total) by mouth every 12 (twelve) hours. 10/03/18   LampteyMyrene Galas, MD    Family History Family History  Problem Relation Age of Onset  . Diabetes Paternal Grandfather   . Diabetes Paternal  Grandmother   . Diabetes Mother   . Heart disease Mother   . Cancer Mother        cervical  . Diabetes Brother   . Stroke Brother   . Heart disease Brother   . Heart disease Sister   . Diabetes Sister   . Cancer Sister        cervical  . Arrhythmia Sister   . Cancer Father        lung    Social History Social History   Tobacco Use  . Smoking status: Current Some Day Smoker    Packs/day: 0.50    Years: 36.00    Pack years: 18.00    Types: Cigarettes    Last attempt to quit: 12/22/2017    Years since quitting: 0.7  . Smokeless tobacco: Never Used  . Tobacco comment: smokes some days, use patch some days  Substance Use Topics  . Alcohol use: Yes    Alcohol/week: 3.0 standard drinks    Types: 3 Glasses of wine per week  . Drug use: Yes    Types: Marijuana    Comment: "last marijuana ~ 06/2014     Allergies   Statins; Tramadol; Ace inhibitors; Lisinopril; Prozac [fluoxetine]; Wellbutrin [bupropion]; Ciprofloxacin hcl; and Sertraline hcl   Review of Systems Review of Systems  Constitutional: Negative for activity change and appetite change.  HENT: Positive for congestion and ear pain. Negative for ear discharge, mouth sores and nosebleeds.   Eyes: Negative for pain and redness.  Respiratory: Positive for chest tightness and shortness of breath. Negative for wheezing.   Cardiovascular: Negative for chest pain and palpitations.  Gastrointestinal: Positive for abdominal pain. Negative for constipation.  Genitourinary: Negative for dysuria, frequency and urgency.  Musculoskeletal: Positive for  arthralgias and myalgias.  Skin: Negative for pallor and rash.  Neurological: Positive for headaches. Negative for dizziness and numbness.  Hematological: Negative for adenopathy. Does not bruise/bleed easily.     Physical Exam Triage Vital Signs ED Triage Vitals  Enc Vitals Group     BP 10/03/18 0842 130/74     Pulse Rate 10/03/18 0842 78     Resp 10/03/18 0842 18     Temp 10/03/18 0842 99.1 F (37.3 C)     Temp Source 10/03/18 0842 Oral     SpO2 10/03/18 0842 99 %     Weight 10/03/18 0840 171 lb (77.6 kg)     Height --      Head Circumference --      Peak Flow --      Pain Score 10/03/18 0840 8     Pain Loc --      Pain Edu? --      Excl. in [place]? --    No data found.  Updated Vital Signs BP 130/74 (BP Location: Right Arm)   Pulse 78   Temp 99.1 F (37.3 C) (Oral)   Resp 18   Wt 77.6 kg   SpO2 99%   BMI 27.60 kg/m   Visual Acuity Right Eye Distance:   Left Eye Distance:   Bilateral Distance:    Right Eye Near:   Left Eye Near:    Bilateral Near:     Physical Exam Constitutional:      Appearance: She is normal weight. She is ill-appearing.  HENT:     Head: Normocephalic.     Right Ear: Tympanic membrane normal.     Left Ear: Tympanic membrane normal.     Nose: Rhinorrhea present.  Mouth/Throat:     Mouth: Mucous membranes are moist.     Pharynx: Posterior oropharyngeal erythema present.  Eyes:     Conjunctiva/sclera: Conjunctivae normal.  Cardiovascular:     Rate and Rhythm: Normal rate and regular rhythm.  Pulmonary:     Breath sounds: Normal breath sounds. No stridor. No wheezing or rhonchi.  Chest:     Chest wall: No tenderness.  Abdominal:     General: Bowel sounds are normal. There is no distension.     Palpations: Abdomen is soft.     Tenderness: There is no abdominal tenderness.  Musculoskeletal: Normal range of motion.  Skin:    General: Skin is warm.     Findings: No erythema or rash.  Neurological:     Mental Status: She is  alert.  Psychiatric:        Mood and Affect: Mood normal.        Behavior: Behavior normal.      UC Treatments / Results  Labs (all labs ordered are listed, but only abnormal results are displayed) Labs Reviewed - No data to display  EKG None  Radiology No results found.  Procedures Procedures (including critical care time)  Medications Ordered in UC Medications - No data to display  Initial Impression / Assessment and Plan / UC Course  I have reviewed the triage vital signs and the nursing notes.  Pertinent labs & imaging results that were available during my care of the patient were reviewed by me and considered in my medical decision making (see chart for details).     1.  Influenza: Tamiflu 75 mg orally twice daily for 5 days Over-the-counter decongestants Encourage oral fluid intake Tylenol as needed for fever Smoke cessation advised Follow-up with primary care physician in 3 to 5 days  2.  Hypertension: Continue current antihypertensive medication regimen  Final Clinical Impressions(s) / UC Diagnoses   Final diagnoses:  Influenza with respiratory manifestation   Discharge Instructions   None    ED Prescriptions    Medication Sig Dispense Auth. Provider   oseltamivir (TAMIFLU) 75 MG capsule Take 1 capsule (75 mg total) by mouth every 12 (twelve) hours. 10 capsule Lamptey, Myrene Galas, MD     Controlled Substance Prescriptions [place] Controlled Substance Registry consulted? No   Chase Picket, MD 10/03/18 2155

## 2018-10-08 ENCOUNTER — Other Ambulatory Visit: Payer: Self-pay | Admitting: Physician Assistant

## 2018-10-08 DIAGNOSIS — M8580 Other specified disorders of bone density and structure, unspecified site: Secondary | ICD-10-CM

## 2018-10-29 ENCOUNTER — Telehealth: Payer: Self-pay | Admitting: Cardiovascular Disease

## 2018-10-29 NOTE — Telephone Encounter (Signed)
New Message   Patient is returning call in reference to her lipid lab results and the suggestion that Dr. Oval Linsey provided. Please call to discuss.

## 2018-12-18 ENCOUNTER — Other Ambulatory Visit: Payer: Medicaid Other

## 2019-01-15 ENCOUNTER — Telehealth: Payer: Self-pay | Admitting: *Deleted

## 2019-01-15 DIAGNOSIS — I10 Essential (primary) hypertension: Secondary | ICD-10-CM

## 2019-01-15 DIAGNOSIS — E7800 Pure hypercholesterolemia, unspecified: Secondary | ICD-10-CM

## 2019-01-15 DIAGNOSIS — Z79899 Other long term (current) drug therapy: Secondary | ICD-10-CM

## 2019-01-15 MED ORDER — EZETIMIBE 10 MG PO TABS: 10.0000 mg | ORAL_TABLET | Freq: Every day | ORAL | 3 refills | Status: DC

## 2019-01-15 NOTE — Telephone Encounter (Signed)
-----   Message from Skeet Latch, MD sent at 10/19/2018  7:45 PM EST ----- Cholesterol levels are very elevated.  Agree with Jory Sims that she should strongly consider PCSK9.  Otherwise, follow up in 6 months instead of a year.  Other cholesterol meds should be coming out next year to consider.

## 2019-01-15 NOTE — Telephone Encounter (Signed)
Patient would like to watch diet better and start Zetia. Rx sent to [facility] and lab orders placed for repeat testing in 3 months.

## 2019-02-10 NOTE — Assessment & Plan Note (Signed)
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 Breast exam done 6/7/2019benign 2. Mammogram 07/03/2018 stable lumpectomy changes no evidence of malignancy, breast density category C.  Return to clinic in 1 year for surveillance checks and follow-up

## 2019-02-10 NOTE — Assessment & Plan Note (Signed)
Status post bowel resection July 2014 at the same time that she had lumpectomy. She has a liver lesion that is being followed. She does not have any symptoms related to carcinoid syndrome and previous chromogranin A levels have all been normal.  CT chest abdomen pelvis 06/25/2015 showed stable 1.8 x 2 cm enhancing lesion within the right lobe of the liver. No need for additional scans because it has demonstrated adequate stability  Tobacco abuse: Patient had a prior heart attack and was trying to quit smoking.  Non-ST elevation MI  Return to clinic in 1 year for surveillance

## 2019-02-11 ENCOUNTER — Telehealth: Payer: Self-pay | Admitting: Hematology and Oncology

## 2019-02-11 NOTE — Telephone Encounter (Signed)
Called patient per sch msg to change appt to virtual visit. Patient confirmed that she has a smart phone and is ok with changing appt to a Doximity call.

## 2019-02-16 NOTE — Progress Notes (Signed)
HEMATOLOGY-ONCOLOGY DOXIMITY VISIT PROGRESS NOTE  I connected with [name] on 02/17/2019 at  8:15 AM EDT by Doximity video conference and verified that I am speaking with the correct person using two identifiers.  I discussed the limitations, risks, security and privacy concerns of performing an evaluation and management service by Doximity and the availability of in person appointments.  I also discussed with the patient that there may be a patient responsible charge related to this service. The patient expressed understanding and agreed to proceed.  Patient's Location: Home Physician Location: Clinic  CHIEF COMPLIANT: Surveillance of left breast DCIS  INTERVAL HISTORY: [name] is a 63 y.o. female with above-mentioned history of left breast DCIS treated with lumpectomy, radiation, and who declined oral anti-estrogen therapy. 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 I last saw her a year ago. Her most recent mammogram on 07/03/18 showed no evidence of malignancy. She presents over Doximity today for annual follow-up.  She had a few issues related to right upper quadrant abdominal discomfort as well as pale-colored stools.  This lasted for about a week and it resolved.  She also complains of bilateral leg cramps and aches.  Denies any swelling.  She is started back on cigarettes.    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[place])   04/02/2013 Initial Diagnosis    Invasive well-differentiated carcinoid  spanning 2 cm invades through muscularis propria into subserosal tissues, 2/5 lymph nodes positive for metastatic neuroendocrine tumor     REVIEW OF SYSTEMS:   Constitutional: Denies fevers, chills or abnormal weight loss Eyes: Denies blurriness of vision Ears, nose, mouth, throat, and face: Denies mucositis or sore throat Respiratory: Denies cough, dyspnea or wheezes Cardiovascular: Denies palpitation, chest discomfort Gastrointestinal: Right upper quadrant abdominal discomfort and pale-colored stools recently Skin: Denies abnormal skin rashes Lymphatics: Denies new lymphadenopathy or easy bruising Neurological:Denies numbness, tingling or new weaknesses Behavioral/Psych: Mood is stable, no new changes  Extremities: No lower extremity edema Breast: denies any pain or lumps or nodules in either breasts All other systems were reviewed with the patient and are negative.  Observations/Objective:  There were no vitals filed for this visit. There is no height or weight on file to calculate BMI.  I have reviewed the data as listed CMP Latest Ref Rng & Units 09/26/2018 04/27/2018 04/26/2018  Glucose 65 - 99 mg/dL 94 93 93  BUN 8 - 27 mg/dL 11 14 11   Creatinine 0.57 - 1.00 mg/dL 0.73 0.69 0.68  Sodium 134 - 144 mmol/L 141 142 141  Potassium 3.5 - 5.2 mmol/L 4.8 4.5 4.2  Chloride 96 - 106 mmol/L 104 108 110  CO2 20 - 29 mmol/L 21 25 22   Calcium 8.7 - 10.3 mg/dL 9.7 9.5 9.3  Total Protein 6.0 - 8.5 g/dL 6.8 7.0 6.7  Total Bilirubin 0.0 - 1.2 mg/dL 0.5 0.7 0.7  Alkaline Phos 39 - 117 IU/L 71 64 61  AST 0 - 40 IU/L 17 18  19  ALT 0 - 32 IU/L 18 22 21     Lab Results  Component Value Date   WBC 6.4 04/27/2018   HGB 13.5 04/27/2018   HCT 40.5 04/27/2018   MCV 87.7 04/27/2018   PLT 269 04/27/2018   NEUTROABS 4.0 02/15/2018      Assessment Plan:  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 Breast exam done 6/7/2019benign 2. Mammogram 07/03/2018 stable lumpectomy changes no evidence of malignancy, breast density category C.  Return to clinic in 1 year for surveillance checks and follow-up  Malignant carcinoid tumor of duodenum Status post bowel resection July 2014 at the same time that she had lumpectomy. She has a liver lesion that is being followed. She does not have any symptoms related to carcinoid syndrome and previous chromogranin A levels have all been normal.  CT chest abdomen pelvis 06/25/2015 showed stable 1.8 x 2 cm enhancing lesion within the right lobe of the liver. I discussed with the patient that if her abdominal symptoms persist, we will obtain another CT scan.  Future scans to be done on an as-needed basis.  Tobacco abuse: Patient had a prior heart attack and was trying to quit smoking.  Non-ST elevation MI.  I discussed with her that she needs to try once again to start quitting smoking.  Return to clinic in 1 year for surveillance    I discussed the assessment and treatment plan with the patient. The patient was provided an opportunity to ask questions and all were answered. The patient agreed with the plan and demonstrated an understanding of the instructions. The patient was advised to call back or seek an in-person evaluation if the symptoms worsen or if the condition fails to improve as anticipated.   I provided 15 minutes of face-to-face Doximity time during this encounter.    Rulon Eisenmenger, MD 02/17/2019   I, Molly Dorshimer, am acting as scribe for Nicholas Lose, MD.  I have reviewed the above documentation for accuracy and completeness, and I agree with the above.

## 2019-02-17 ENCOUNTER — Inpatient Hospital Stay: Payer: Medicaid Other | Attending: Hematology and Oncology | Admitting: Hematology and Oncology

## 2019-02-17 DIAGNOSIS — F17210 Nicotine dependence, cigarettes, uncomplicated: Secondary | ICD-10-CM | POA: Diagnosis not present

## 2019-02-17 DIAGNOSIS — D0512 Intraductal carcinoma in situ of left breast: Secondary | ICD-10-CM

## 2019-02-17 DIAGNOSIS — C7A010 Malignant carcinoid tumor of the duodenum: Secondary | ICD-10-CM

## 2019-02-18 ENCOUNTER — Other Ambulatory Visit: Payer: Medicaid Other

## 2019-03-03 ENCOUNTER — Other Ambulatory Visit: Payer: Self-pay

## 2019-03-03 ENCOUNTER — Encounter (HOSPITAL_COMMUNITY): Payer: Self-pay | Admitting: Emergency Medicine

## 2019-03-03 ENCOUNTER — Emergency Department (HOSPITAL_COMMUNITY)
Admission: EM | Admit: 2019-03-03 | Discharge: 2019-03-03 | Disposition: A | Discharge status: Home / Self Care | Payer: Medicaid Other | Attending: Emergency Medicine | Admitting: Emergency Medicine

## 2019-03-03 DIAGNOSIS — Z5321 Procedure and treatment not carried out due to patient leaving prior to being seen by health care provider: Secondary | ICD-10-CM | POA: Diagnosis not present

## 2019-03-03 DIAGNOSIS — R109 Unspecified abdominal pain: Secondary | ICD-10-CM | POA: Diagnosis present

## 2019-03-03 LAB — CBC
HCT: 40.1 % (ref 36.0–46.0)
Hemoglobin: 12.8 g/dL (ref 12.0–15.0)
MCH: 28.3 pg (ref 26.0–34.0)
MCHC: 31.9 g/dL (ref 30.0–36.0)
MCV: 88.5 fL (ref 80.0–100.0)
Platelets: 244 10*3/uL (ref 150–400)
RBC: 4.53 MIL/uL (ref 3.87–5.11)
RDW: 13.5 % (ref 11.5–15.5)
WBC: 9.5 10*3/uL (ref 4.0–10.5)
nRBC: 0 % (ref 0.0–0.2)

## 2019-03-03 LAB — COMPREHENSIVE METABOLIC PANEL
ALT: 20 U/L (ref 0–44)
AST: 20 U/L (ref 15–41)
Albumin: 3.7 g/dL (ref 3.5–5.0)
Alkaline Phosphatase: 60 U/L (ref 38–126)
Anion gap: 12 (ref 5–15)
BUN: 19 mg/dL (ref 8–23)
CO2: 18 mmol/L — ABNORMAL LOW (ref 22–32)
Calcium: 9.1 mg/dL (ref 8.9–10.3)
Chloride: 108 mmol/L (ref 98–111)
Creatinine, Ser: 0.71 mg/dL (ref 0.44–1.00)
GFR calc Af Amer: >60 mL/min (ref >60)
GFR calc non Af Amer: >60 mL/min (ref >60)
Glucose, Bld: 96 mg/dL (ref 70–99)
Potassium: 3.7 mmol/L (ref 3.5–5.1)
Sodium: 138 mmol/L (ref 135–145)
Total Bilirubin: 0.7 mg/dL (ref 0.3–1.2)
Total Protein: 6.4 g/dL — ABNORMAL LOW (ref 6.5–8.1)

## 2019-03-03 LAB — LIPASE, BLOOD: Lipase: 22 U/L (ref 11–51)

## 2019-03-03 MED ORDER — SODIUM CHLORIDE 0.9% FLUSH: 3.0000 mL | Freq: Once | INTRAVENOUS | Status: DC

## 2019-03-03 NOTE — ED Triage Notes (Signed)
Pt reports ULQ pain X2 days.  Pt reports has seen her PCP and now has an appointment w/ "the liver doctor" next week.  Pt reports nausea but no emesis, regular BM.  Pt was at [facility] two weeks ago but no symptoms.

## 2019-03-03 NOTE — ED Triage Notes (Signed)
Pt came to sort and stated "I'M going to go to the hospital that my Dr. Works at."

## 2019-04-09 ENCOUNTER — Other Ambulatory Visit: Payer: Self-pay | Admitting: Cardiovascular Disease

## 2019-04-29 ENCOUNTER — Other Ambulatory Visit: Payer: Self-pay

## 2019-04-29 ENCOUNTER — Ambulatory Visit
Admission: RE | Admit: 2019-04-29 | Discharge: 2019-04-29 | Disposition: A | Discharge status: Home / Self Care | Payer: Medicaid Other | Source: Ambulatory Visit | Attending: Physician Assistant | Admitting: Physician Assistant

## 2019-04-29 DIAGNOSIS — M8580 Other specified disorders of bone density and structure, unspecified site: Secondary | ICD-10-CM

## 2019-05-18 ENCOUNTER — Encounter (HOSPITAL_COMMUNITY): Payer: Self-pay

## 2019-05-18 ENCOUNTER — Ambulatory Visit (INDEPENDENT_AMBULATORY_CARE_PROVIDER_SITE_OTHER): Payer: Medicaid Other

## 2019-05-18 ENCOUNTER — Other Ambulatory Visit: Payer: Self-pay

## 2019-05-18 ENCOUNTER — Ambulatory Visit (HOSPITAL_COMMUNITY)
Admission: EM | Admit: 2019-05-18 | Discharge: 2019-05-18 | Disposition: A | Discharge status: Home / Self Care | Payer: Medicaid Other | Attending: Family Medicine | Admitting: Family Medicine

## 2019-05-18 DIAGNOSIS — M25551 Pain in right hip: Secondary | ICD-10-CM

## 2019-05-18 DIAGNOSIS — M545 Low back pain, unspecified: Secondary | ICD-10-CM

## 2019-05-18 DIAGNOSIS — M25559 Pain in unspecified hip: Secondary | ICD-10-CM

## 2019-05-18 DIAGNOSIS — W19XXXA Unspecified fall, initial encounter: Secondary | ICD-10-CM | POA: Diagnosis not present

## 2019-05-18 MED ORDER — KETOROLAC TROMETHAMINE 30 MG/ML IJ SOLN
INTRAMUSCULAR | Status: AC
  Filled 2019-05-18: qty 1

## 2019-05-18 MED ORDER — NAPROXEN 375 MG PO TABS: 375.0000 mg | ORAL_TABLET | Freq: Two times a day (BID) | ORAL | 0 refills | Status: DC

## 2019-05-18 MED ORDER — KETOROLAC TROMETHAMINE 30 MG/ML IJ SOLN
30.0000 mg | Freq: Once | INTRAMUSCULAR | Status: AC
  Administered 2019-05-18: 30 mg via INTRAMUSCULAR

## 2019-05-18 MED ORDER — TIZANIDINE HCL 2 MG PO TABS: 2.0000 mg | ORAL_TABLET | Freq: Every day | ORAL | 0 refills | Status: DC

## 2019-05-18 NOTE — ED Provider Notes (Signed)
[place]    CSN: [id number] Arrival date & time: 05/18/19  1736      History   Chief Complaint Chief Complaint  Patient presents with  . Back Pain  . Fall    HPI [name] is a 63 y.o. female.   [name] presents with complaints of right sided low back pain and hip pain which started yesterday and worsened today. She went to sit on chair yesterday, missed it, falling on the ground onto her right hip/low back. Had some pain but was able to get up. Today while lifting a garbage bag into the can the pain significantly worsened. Pain to right side, wraps around to groin even. Pain worse with lifting of right leg and with weight bearing on right leg. No numbness or tingling. No radiation of pain down leg. Took tylenol which hasn't helped with pain. Denies any previous back or hip pain. No saddle involvement, no loss of bladder or bowel. Pain is severe. History  Of anxiety, arthritis, brain aneurysm, depression, hep c, htn, breast cancer, tia. Recent bone density testing considered her osteopenic.     ROS per HPI, negative if not otherwise mentioned.      Past Medical History:  Diagnosis Date  . Adjustment disorder with mixed anxiety and depressed mood 01/05/2013  . Allergy   . Anxiety   . Arthritis    "knees,wrists" (07/31/2014)  . Brain aneurysm 2012   2cms.  . Breast cancer ([place]) 06/05/2013  . Breast disorder    lump in left breast  . Carcinoid tumor   . Complication of anesthesia    hallucinations , anxiety   . Depression   . H/O echocardiogram    not finding report in EPIC, pt. reports that it was before her appt. /w Dr. Johnsie Cancel   . Hepatitis B dx'd ~ 1980  . Hepatitis C dx'd ~ 2000   "specialist says it cleared itself on it's own"  . Hx of radiation therapy 07/17/13-09/03/13   left breast,60.4Gy, 32/33 fx completed  . Hyperlipidemia   . Hypertension    "never took my RX" (07/31/2014)  . Jaundice   . 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 Pain management    goes to [facility] clinic, but she doesn't intend to return   . Personal history of radiation therapy    2014  . Stroke [facility]) 2012   TIA  . TIA (transient ischemic attack) 06/2011   "can't see real well out of left eye since; weaker on left side since"  . Varicose veins   . Walking pneumonia ~ 2008    Patient Active Problem List   Diagnosis Date Noted  . Takotsubo cardiomyopathy 12/25/2017  . Cerebrovascular accident (CVA) ([place])   . 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 Elevated troponin   . NSTEMI (non-ST elevated myocardial infarction) ([place]) 12/23/2017  . Head lice infestation [id number]  . Chest pain 07/31/2014  . 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 Partial small bowel obstruction ([place]) 04/07/2013  . SBO (small bowel obstruction) ([place]) 01/05/2013  . Adjustment disorder with mixed anxiety and depressed mood 01/05/2013  . Chronic pain syndrome 01/05/2013  . Thyroid cyst 01/05/2013  . Tobacco abuse 12/05/2012  . Hepatitis C   . Jaundice   . Hyperlipidemia   . Varicose veins   . Brain aneurysm   .  TIA (transient ischemic attack) 06/12/2011    Past Surgical History:  Procedure Laterality Date  . BREAST BIOPSY Left 03/2013; 05/2013  . BREAST LUMPECTOMY Left 04/02/2013   Procedure: Excision  of Left Breast Mass;  Surgeon: Harl Bowie, MD;  Location: [place];  Service: General;  Laterality: Left;  . BREAST LUMPECTOMY WITH SENTINEL LYMPH NODE BIOPSY Left 06/05/2013   Procedure: BREAST LUMPECTOMY WITH SENTINEL LYMPH NODE BX;  Surgeon: Harl Bowie, MD;  Location: [place];  Service: General;  Laterality: Left;  . CARDIAC CATHETERIZATION  12/24/2017  . COLON SURGERY  03/2013   bowel obstruction , malignant tumor    . DILATION AND CURETTAGE OF UTERUS  ~ 1990   S/P miscarriage  . EVACUATION BREAST HEMATOMA Left  06/05/2013   Procedure: EVACUATION HEMATOMA BREAST;  Surgeon: Harl Bowie, MD;  Location: [place];  Service: General;  Laterality: Left;  . LAPAROSCOPY N/A 04/02/2013   Procedure: Diaganostic Lap.; Small Bowel Resection;  Surgeon: Harl Bowie, MD;  Location: [place];  Service: General;  Laterality: N/A;  . LEFT HEART CATH AND CORONARY ANGIOGRAPHY N/A 12/24/2017   Procedure: LEFT HEART CATH AND CORONARY ANGIOGRAPHY;  Surgeon: Jettie Booze, MD;  Location: [place] [facility];  Service: Cardiovascular;  Laterality: N/A;  . TUMOR EXCISION  04/02/2013   "carcoid tumor removed"  . vaginal births   59    birthed twins at -[redacted] weeks gestation - both survived , total of 3 pregnancies - all born vaginally    OB History    Gravida  5   Para  4   Term      Preterm  2   AB  1   Living  4     SAB      TAB  1   Ectopic      Multiple      Live Births               Home Medications    Prior to Admission medications   Medication Sig Start Date End Date Taking? Authorizing Provider  acetaminophen (TYLENOL) 500 MG tablet Take 500 mg by mouth 2 (two) times daily as needed (for back and knee pain).     [provider]  ALPRAZolam Duanne Moron) 1 MG tablet Take 1 tablet (1 mg total) by mouth 2 (two) times daily as needed for anxiety. 06/04/15   Nicholas Lose, MD  aspirin EC 81 MG tablet Take 81 mg by mouth daily.    [provider]  ezetimibe (ZETIA) 10 MG tablet Take 1 tablet (10 mg total) by mouth daily. 01/15/19 04/15/19  Skeet Latch, MD  lisinopril (PRINIVIL,ZESTRIL) 10 MG tablet Take 1 tablet (10 mg total) by mouth daily. 10/01/18   Lendon Colonel, NP  Melatonin 10 MG SUBL Place 1 tablet under the tongue at bedtime as needed (sleep).    [provider]  metoprolol succinate (TOPROL-XL) 25 MG 24 hr tablet Take 0.5 tablets (12.5 mg total) by mouth daily. 10/01/18   Lendon Colonel, NP  naproxen (NAPROSYN) 375 MG tablet Take 1 tablet (375 mg  total) by mouth 2 (two) times daily. 05/18/19   Zigmund Gottron, NP  nicotine (NICODERM CQ) 7 mg/24hr patch Place 1 patch (7 mg total) onto the skin daily. FOR 2 WEEKS. TO BE USED ONCE YOU COMPLETE THE 1 MONTH TREATMENT 03/27/18   Skeet Latch, MD  nitroGLYCERIN (NITROSTAT) 0.4 MG SL tablet Place 1 tablet (0.4 mg total)  under the tongue every 5 (five) minutes x 3 doses as needed for chest pain. 12/25/17   Duke, Tami Lin, PA  oseltamivir (TAMIFLU) 75 MG capsule Take 1 capsule (75 mg total) by mouth every 12 (twelve) hours. 10/03/18   Lamptey, Myrene Galas, MD  tiZANidine (ZANAFLEX) 2 MG tablet Take 1 tablet (2 mg total) by mouth at bedtime. 05/18/19   Zigmund Gottron, NP    Family History Family History  Problem Relation Age of Onset  . Diabetes Paternal Grandfather   . Diabetes Paternal Grandmother   . Diabetes Mother   . Heart disease Mother   . Cancer Mother        cervical  . Diabetes Brother   . Stroke Brother   . Heart disease Brother   . Heart disease Sister   . Diabetes Sister   . Cancer Sister        cervical  . Arrhythmia Sister   . Cancer Father        lung    Social History Social History   Tobacco Use  . Smoking status: Current Some Day Smoker    Packs/day: 0.50    Years: 36.00    Pack years: 18.00    Types: Cigarettes    Last attempt to quit: 12/22/2017    Years since quitting: 1.4  . Smokeless tobacco: Never Used  . Tobacco comment: smokes some days, use patch some days  Substance Use Topics  . Alcohol use: Yes    Alcohol/week: 3.0 standard drinks    Types: 3 Glasses of wine per week  . Drug use: Yes    Types: Marijuana    Comment: "last marijuana ~ 06/2014     Allergies   Statins, Tramadol, Ace inhibitors, Lisinopril, Prozac [fluoxetine], Wellbutrin [bupropion], Ciprofloxacin hcl, and Sertraline hcl   Review of Systems Review of Systems   Physical Exam Triage Vital Signs ED Triage Vitals  Enc Vitals Group     BP 05/18/19 1837 139/72      Pulse Rate 05/18/19 1837 65     Resp 05/18/19 1837 15     Temp 05/18/19 1837 98.1 F (36.7 C)     Temp Source 05/18/19 1837 Oral     SpO2 05/18/19 1837 99 %     Weight --      Height --      Head Circumference --      Peak Flow --      Pain Score 05/18/19 1836 8     Pain Loc --      Pain Edu? --      Excl. in [place]? --    No data found.  Updated Vital Signs BP 139/72 (BP Location: Left Arm)   Pulse 65   Temp 98.1 F (36.7 C) (Oral)   Resp 15   SpO2 99%    Physical Exam Constitutional:      General: She is not in acute distress.    Appearance: She is well-developed.     Comments: Patient appears quite uncomfortable in pain.   Cardiovascular:     Rate and Rhythm: Normal rate.  Pulmonary:     Effort: Pulmonary effort is normal.  Musculoskeletal:     Right hip: She exhibits tenderness and bony tenderness. She exhibits normal range of motion, normal strength, no swelling, no crepitus, no deformity and no laceration.     Lumbar back: She exhibits tenderness, bony tenderness and pain. She exhibits normal range of motion, no swelling, no edema,  no deformity, no laceration, no spasm and normal pulse.       Back:     Comments: Right low back and hip with tenderness on palpation; no step off or deformity to spinous processes; pain with transition from sit to lay and lay to sit; pain with right straight leg raise; no pain with femur abduction or external rotation; minimal pain with hip flexion; strength equal bilaterally; gross sensation intact to lower extremities; noticeable pain with stepping onto exam table; ambulatory; no bruising or swelling visible   Skin:    General: Skin is warm and dry.  Neurological:     Mental Status: She is alert and oriented to person, place, and time.      UC Treatments / Results  Labs (all labs ordered are listed, but only abnormal results are displayed) Labs Reviewed - No data to display  EKG   Radiology Dg Lumbar Spine Complete  Result  Date: 05/18/2019 CLINICAL DATA:  Lower back and right hip pain from fall x 1 day . Constant pain, but hurts more when walking, bending, lifting leg, and sitting. No previous injury or fx. EXAM: LUMBAR SPINE - COMPLETE 4+ VIEW COMPARISON:  CT abdomen pelvis 06/25/2015 FINDINGS: Five non-rib-bearing lumbar-type vertebral bodies. Normal alignment. Vertebral body heights are maintained without evidence of fracture. Mild scattered degenerative changes including anterior spurring. Aortic atherosclerotic calcification. Nonobstructive bowel gas pattern. IMPRESSION: No acute fracture or static subluxation on radiographs of the lumbar spine. Electronically Signed   By: Audie Pinto M.D.   On: 05/18/2019 19:24   Dg Hip Unilat With Pelvis 2-3 Views Right  Result Date: 05/18/2019 CLINICAL DATA:  63 year old female with fall and right hip pain. EXAM: DG HIP (WITH OR WITHOUT PELVIS) 2-3V RIGHT COMPARISON:  CT of the abdomen pelvis dated 04/27/2018 FINDINGS: There is no acute fracture or dislocation. The bones are osteopenic. Mild bilateral hip arthritic changes. The soft tissues are unremarkable. IMPRESSION: No acute fracture or dislocation. Electronically Signed   By: Anner Crete M.D.   On: 05/18/2019 19:20    Procedures Procedures (including critical care time)  Medications Ordered in UC Medications  ketorolac (TORADOL) 30 MG/ML injection 30 mg (30 mg Intramuscular Given 05/18/19 1841)  ketorolac (TORADOL) 30 MG/ML injection (has no administration in time range)    Initial Impression / Assessment and Plan / UC Course  I have reviewed the triage vital signs and the nursing notes.  Pertinent labs & imaging results that were available during my care of the patient were reviewed by me and considered in my medical decision making (see chart for details).     xrays reassuring. Exam consistent with some sciatica, but also with traumatic fall, likely with some contusion. Pain management discussed. Activity  as tolerated. Follow up with PCP for recheck as may need referral. Patient verbalized understanding and agreeable to plan.  Ambulatory out of clinic without difficulty.    Final Clinical Impressions(s) / UC Diagnoses   Final diagnoses:  Hip pain  Acute midline low back pain, unspecified whether sciatica present  Right hip pain  Fall, initial encounter   Discharge Instructions   None    ED Prescriptions    Medication Sig Dispense Auth. Provider   naproxen (NAPROSYN) 375 MG tablet Take 1 tablet (375 mg total) by mouth 2 (two) times daily. 20 tablet Augusto Gamble B, NP   tiZANidine (ZANAFLEX) 2 MG tablet Take 1 tablet (2 mg total) by mouth at bedtime. 20 tablet Zigmund Gottron, NP  Controlled Substance Prescriptions [place] Controlled Substance Registry consulted? Not Applicable   Zigmund Gottron, NP 05/18/19 1931

## 2019-05-18 NOTE — Discharge Instructions (Signed)
Naproxen, twice a day, take with food.  Zanaflex, as needed at night as a muscle relaxer. May cause drowsiness. Please do not take if driving or drinking alcohol.   Your xrays are normal which is reassuring, strain as well as bruising likely.  Light and regular activity as tolerated.  See exercises provided.  Heat application while active can help with muscle spasms.  Sleep with pillow under your knees.   Please follow up with your primary care provider for recheck of persistent symptoms.

## 2019-05-18 NOTE — ED Triage Notes (Signed)
Patient presents to Urgent Care with complaints of right hip and lower back pain since accidentally missing the chair in her yard that she was trying to sit on yesterday and falling. Patient reports it was so bad when she took the trash today she could hardly walk.

## 2019-08-26 ENCOUNTER — Ambulatory Visit: Payer: Medicaid Other | Admitting: Cardiovascular Disease

## 2019-09-03 ENCOUNTER — Emergency Department (HOSPITAL_COMMUNITY): Payer: Medicaid Other

## 2019-09-03 ENCOUNTER — Other Ambulatory Visit: Payer: Self-pay

## 2019-09-03 ENCOUNTER — Emergency Department (HOSPITAL_COMMUNITY)
Admission: EM | Admit: 2019-09-03 | Discharge: 2019-09-03 | Disposition: A | Discharge status: Home / Self Care | Payer: Medicaid Other | Attending: Emergency Medicine | Admitting: Emergency Medicine

## 2019-09-03 ENCOUNTER — Encounter (HOSPITAL_COMMUNITY): Payer: Self-pay | Admitting: Emergency Medicine

## 2019-09-03 ENCOUNTER — Emergency Department (HOSPITAL_BASED_OUTPATIENT_CLINIC_OR_DEPARTMENT_OTHER): Payer: Medicaid Other

## 2019-09-03 DIAGNOSIS — Z7982 Long term (current) use of aspirin: Secondary | ICD-10-CM | POA: Diagnosis not present

## 2019-09-03 DIAGNOSIS — R0602 Shortness of breath: Secondary | ICD-10-CM | POA: Diagnosis present

## 2019-09-03 DIAGNOSIS — Z20828 Contact with and (suspected) exposure to other viral communicable diseases: Secondary | ICD-10-CM | POA: Diagnosis not present

## 2019-09-03 DIAGNOSIS — B182 Chronic viral hepatitis C: Secondary | ICD-10-CM | POA: Diagnosis not present

## 2019-09-03 DIAGNOSIS — I10 Essential (primary) hypertension: Secondary | ICD-10-CM | POA: Insufficient documentation

## 2019-09-03 DIAGNOSIS — M79609 Pain in unspecified limb: Secondary | ICD-10-CM | POA: Diagnosis not present

## 2019-09-03 DIAGNOSIS — M7989 Other specified soft tissue disorders: Secondary | ICD-10-CM

## 2019-09-03 DIAGNOSIS — I82890 Acute embolism and thrombosis of other specified veins: Secondary | ICD-10-CM | POA: Insufficient documentation

## 2019-09-03 DIAGNOSIS — Z720 Tobacco use: Secondary | ICD-10-CM | POA: Diagnosis not present

## 2019-09-03 DIAGNOSIS — Z853 Personal history of malignant neoplasm of breast: Secondary | ICD-10-CM | POA: Diagnosis not present

## 2019-09-03 DIAGNOSIS — Z79899 Other long term (current) drug therapy: Secondary | ICD-10-CM | POA: Insufficient documentation

## 2019-09-03 DIAGNOSIS — R0789 Other chest pain: Secondary | ICD-10-CM | POA: Insufficient documentation

## 2019-09-03 DIAGNOSIS — I252 Old myocardial infarction: Secondary | ICD-10-CM | POA: Diagnosis not present

## 2019-09-03 LAB — CBC WITH DIFFERENTIAL/PLATELET
Abs Immature Granulocytes: 0.03 10*3/uL (ref 0.00–0.07)
Basophils Absolute: 0 10*3/uL (ref 0.0–0.1)
Basophils Relative: 0 %
Eosinophils Absolute: 0.2 10*3/uL (ref 0.0–0.5)
Eosinophils Relative: 3 %
HCT: 43.2 % (ref 36.0–46.0)
Hemoglobin: 13.6 g/dL (ref 12.0–15.0)
Immature Granulocytes: 0 %
Lymphocytes Relative: 23 %
Lymphs Abs: 1.5 10*3/uL (ref 0.7–4.0)
MCH: 28.4 pg (ref 26.0–34.0)
MCHC: 31.5 g/dL (ref 30.0–36.0)
MCV: 90.2 fL (ref 80.0–100.0)
Monocytes Absolute: 0.5 10*3/uL (ref 0.1–1.0)
Monocytes Relative: 7 %
Neutro Abs: 4.5 10*3/uL (ref 1.7–7.7)
Neutrophils Relative %: 67 %
Platelets: 219 10*3/uL (ref 150–400)
RBC: 4.79 MIL/uL (ref 3.87–5.11)
RDW: 13.3 % (ref 11.5–15.5)
WBC: 6.7 10*3/uL (ref 4.0–10.5)
nRBC: 0 % (ref 0.0–0.2)

## 2019-09-03 LAB — BASIC METABOLIC PANEL
Anion gap: 11 (ref 5–15)
BUN: 14 mg/dL (ref 8–23)
CO2: 22 mmol/L (ref 22–32)
Calcium: 9.2 mg/dL (ref 8.9–10.3)
Chloride: 107 mmol/L (ref 98–111)
Creatinine, Ser: 0.76 mg/dL (ref 0.44–1.00)
GFR calc Af Amer: >60 mL/min (ref >60)
GFR calc non Af Amer: >60 mL/min (ref >60)
Glucose, Bld: 101 mg/dL — ABNORMAL HIGH (ref 70–99)
Potassium: 3.9 mmol/L (ref 3.5–5.1)
Sodium: 140 mmol/L (ref 135–145)

## 2019-09-03 LAB — TROPONIN I (HIGH SENSITIVITY)
Troponin I (High Sensitivity): 4 ng/L (ref <18)
Troponin I (High Sensitivity): 4 ng/L (ref <18)

## 2019-09-03 LAB — POC SARS CORONAVIRUS 2 AG -  ED: SARS Coronavirus 2 Ag: NEGATIVE

## 2019-09-03 MED ORDER — IOHEXOL 350 MG/ML SOLN
80.0000 mL | Freq: Once | INTRAVENOUS | Status: AC | PRN
  Administered 2019-09-03: 15:00:00 80 mL via INTRAVENOUS

## 2019-09-03 MED ORDER — NAPROXEN 500 MG PO TABS: 500.0000 mg | ORAL_TABLET | Freq: Every day | ORAL | 0 refills | Status: DC

## 2019-09-03 NOTE — ED Provider Notes (Signed)
[facility] EMERGENCY DEPARTMENT Provider Note   CSN: [id number] Arrival date & time: 09/03/19  [id number]     History Chief Complaint  Patient presents with  . Shortness of Breath    [name] is a 63 y.o. female past medical history of brain aneurysm, breast cancer (currently cancer free), NSTEMI, hepatitis C who presents for evaluation of fatigue, chest pain, shortness of breath that has been ongoing for about 2 days.  She states about 2 days ago, she had some pain and swelling noted to the posterior aspect of her lower leg, particularly around her posterior knee and tib-fib/calf area.  She states that the area turned blue and then red.  She states that has improved but said it still feels slightly swollen.  SShe states that afterwards, she started developing some shortness of breath.  She states that this is worse with activity and if she lays still, it is better.  Additionally, she describes "a weird sensation" in her chest.  She has difficulty describing this exactly to me.  She states it is not a sharp pain or heaviness but just feels "kind of weird in the middle."  She does state that this pain is worse with deep inspiration and worse with bending down.  This pain is not worsened by exertion.  No associated nausea/vomiting.  She states that she is also experienced a "fluttering type" sensation throughout the last 2 days.  She states she has not had any recent travel and denies any known COVID-19 exposure.  She denies any fevers, cough, congestion, abdominal pain, nausea/vomiting. She denies any OCP use, recent immobilization, prior history of DVT/PE, recent surgery, leg swelling, or long travel.  She does state that she is a smoker and smokes about 5 to 6 cigarettes a day.  She has a history of NSTEMI in 2018 in [place].   The history is provided by the patient.    HPI: A 63 year old patient with a history of hypertension presents for evaluation of chest  pain. Initial onset of pain was more than 6 hours ago. The patient's chest pain is sharp and is not worse with exertion. The patient's chest pain is middle- or left-sided, is not well-localized, is not described as heaviness/pressure/tightness and does not radiate to the arms/jaw/neck. The patient does not complain of nausea and denies diaphoresis. The patient has no history of stroke, has no history of peripheral artery disease, has not smoked in the past 90 days, denies any history of treated diabetes, has no relevant family history of coronary artery disease (first degree relative at less than age 56), has no history of hypercholesterolemia and does not have an elevated BMI (>=30).   Past Medical History:  Diagnosis Date  . Adjustment disorder with mixed anxiety and depressed mood 01/05/2013  . Allergy   . Anxiety   . Arthritis    "knees,wrists" (07/31/2014)  . Brain aneurysm 2012   2cms.  . Breast cancer ([place]) 06/05/2013  . Breast disorder    lump in left breast  . Carcinoid tumor   . Complication of anesthesia    hallucinations , anxiety   . Depression   . H/O echocardiogram    not finding report in EPIC, pt. reports that it was before her appt. /w Dr. Johnsie Cancel   . Hepatitis B dx'd ~ 1980  . Hepatitis C dx'd ~ 2000   "specialist says it cleared itself on it's own"  . Hx of radiation therapy 07/17/13-09/03/13  left breast,60.4Gy, 32/33 fx completed  . Hyperlipidemia   . Hypertension    "never took my RX" (07/31/2014)  . Jaundice   . Malignant neoplasm of lower-outer quadrant of female breast ([place]) 05/19/2013  . Pain management    goes to [facility] clinic, but she doesn't intend to return   . Personal history of radiation therapy    2014  . Stroke [facility]) 2012   TIA  . TIA (transient ischemic attack) 06/2011   "can't see real well out of left eye since; weaker on left side since"  . Varicose veins   . Walking pneumonia ~ 2008    Patient Active Problem List   Diagnosis Date Noted    . Takotsubo cardiomyopathy 12/25/2017  . Cerebrovascular accident (CVA) ([place])   . 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 Elevated troponin   . NSTEMI (non-ST elevated myocardial infarction) ([place]) 12/23/2017  . Head lice infestation [id number]  . Chest pain 07/31/2014  . 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 Partial small bowel obstruction ([place]) 04/07/2013  . SBO (small bowel obstruction) ([place]) 01/05/2013  . Adjustment disorder with mixed anxiety and depressed mood 01/05/2013  . Chronic pain syndrome 01/05/2013  . Thyroid cyst 01/05/2013  . Tobacco abuse 12/05/2012  . Hepatitis C   . Jaundice   . Hyperlipidemia   . Varicose veins   . Brain aneurysm   . TIA (transient ischemic attack) 06/12/2011    Past Surgical History:  Procedure Laterality Date  . BREAST BIOPSY Left 03/2013; 05/2013  . BREAST LUMPECTOMY Left 04/02/2013   Procedure: Excision  of Left Breast Mass;  Surgeon: Harl Bowie, MD;  Location: [place];  Service: General;  Laterality: Left;  . BREAST LUMPECTOMY WITH SENTINEL LYMPH NODE BIOPSY Left 06/05/2013   Procedure: BREAST LUMPECTOMY WITH SENTINEL LYMPH NODE BX;  Surgeon: Harl Bowie, MD;  Location: [place];  Service: General;  Laterality: Left;  . CARDIAC CATHETERIZATION  12/24/2017  . COLON SURGERY  03/2013   bowel obstruction , malignant tumor    . DILATION AND CURETTAGE OF UTERUS  ~ 1990   S/P miscarriage  . EVACUATION BREAST HEMATOMA Left 06/05/2013   Procedure: EVACUATION HEMATOMA BREAST;  Surgeon: Harl Bowie, MD;  Location: [place];  Service: General;  Laterality: Left;  . LAPAROSCOPY N/A 04/02/2013   Procedure: Diaganostic Lap.; Small Bowel Resection;  Surgeon: Harl Bowie, MD;  Location: [place];  Service: General;  Laterality: N/A;  . LEFT HEART CATH AND CORONARY ANGIOGRAPHY N/A 12/24/2017   Procedure: LEFT HEART CATH AND CORONARY  ANGIOGRAPHY;  Surgeon: Jettie Booze, MD;  Location: [place] [facility];  Service: Cardiovascular;  Laterality: N/A;  . TUMOR EXCISION  04/02/2013   "carcoid tumor removed"  . vaginal births   18    birthed twins at -[redacted] weeks gestation - both survived , total of 3 pregnancies - all born vaginally     OB History    Gravida  5   Para  4   Term      Preterm  2   AB  1   Living  4     SAB      TAB  1   Ectopic      Multiple      Live Births              Family History  Problem Relation Age of Onset  . Diabetes Paternal Grandfather   . Diabetes Paternal Grandmother   . Diabetes Mother   . Heart disease Mother   . Cancer Mother        cervical  . Diabetes Brother   . Stroke Brother   . Heart disease Brother   . Heart disease Sister   . Diabetes Sister   . Cancer Sister        cervical  . Arrhythmia Sister   . Cancer Father        lung    Social History   Tobacco Use  . Smoking status: Current Some Day Smoker    Packs/day: 0.50    Years: 36.00    Pack years: 18.00    Types: Cigarettes    Last attempt to quit: 12/22/2017    Years since quitting: 1.6  . Smokeless tobacco: Never Used  . Tobacco comment: smokes some days, use patch some days  Substance Use Topics  . Alcohol use: Yes    Alcohol/week: 3.0 standard drinks    Types: 3 Glasses of wine per week  . Drug use: Yes    Types: Marijuana    Comment: "last marijuana ~ 06/2014    Home Medications Prior to Admission medications   Medication Sig Start Date End Date Taking? Authorizing Provider  acetaminophen (TYLENOL) 500 MG tablet Take 500 mg by mouth 2 (two) times daily as needed (for back and knee pain).     [provider]  ALPRAZolam Duanne Moron) 1 MG tablet Take 1 tablet (1 mg total) by mouth 2 (two) times daily as needed for anxiety. 06/04/15   Nicholas Lose, MD  aspirin EC 81 MG tablet Take 81 mg by mouth daily.    [provider]  ezetimibe (ZETIA) 10 MG tablet Take 1  tablet (10 mg total) by mouth daily. 01/15/19 04/15/19  Skeet Latch, MD  lisinopril (PRINIVIL,ZESTRIL) 10 MG tablet Take 1 tablet (10 mg total) by mouth daily. 10/01/18   Lendon Colonel, NP  Melatonin 10 MG SUBL Place 1 tablet under the tongue at bedtime as needed (sleep).    [provider]  metoprolol succinate (TOPROL-XL) 25 MG 24 hr tablet Take 0.5 tablets (12.5 mg total) by mouth daily. 10/01/18   Lendon Colonel, NP  naproxen (NAPROSYN) 500 MG tablet Take 1 tablet (500 mg total) by mouth daily. 09/03/19   Volanda Napoleon, PA-C  nicotine (NICODERM CQ) 7 mg/24hr patch Place 1 patch (7 mg total) onto the skin daily. FOR 2 WEEKS. TO BE USED ONCE YOU COMPLETE THE 1 MONTH TREATMENT 03/27/18   Skeet Latch, MD  nitroGLYCERIN (NITROSTAT) 0.4 MG SL tablet Place 1 tablet (0.4 mg total) under the tongue every 5 (five) minutes x 3 doses as needed for chest pain. 12/25/17   Duke, Tami Lin, PA  oseltamivir (TAMIFLU) 75 MG capsule Take 1 capsule (75 mg total) by mouth every 12 (twelve) hours. 10/03/18   Lamptey, Myrene Galas, MD  tiZANidine (ZANAFLEX) 2 MG tablet Take 1 tablet (2 mg total) by mouth at bedtime. 05/18/19   Zigmund Gottron, NP    Allergies    Statins, Tramadol, Ace inhibitors, Lisinopril, Prozac [fluoxetine], Wellbutrin [bupropion], Ciprofloxacin hcl, and Sertraline hcl  Review of Systems   Review of Systems  Constitutional: Negative for fever.  Respiratory: Positive for shortness of breath. Negative for cough.   Cardiovascular: Positive for chest pain and leg swelling.  Gastrointestinal: Negative for abdominal pain, nausea and  vomiting.  Genitourinary: Negative for dysuria and hematuria.  Neurological: Negative for headaches.  All other systems reviewed and are negative.   Physical Exam Updated Vital Signs BP (!) 156/99   Pulse 80   Temp 98.1 F (36.7 C) (Oral)   Resp 17   SpO2 97%   Physical Exam Vitals and nursing note reviewed.  Constitutional:       Appearance: Normal appearance. She is well-developed.  HENT:     Head: Normocephalic and atraumatic.  Eyes:     General: Lids are normal.     Conjunctiva/sclera: Conjunctivae normal.     Pupils: Pupils are equal, round, and reactive to light.  Cardiovascular:     Rate and Rhythm: Normal rate and regular rhythm.     Pulses: Normal pulses.          Dorsalis pedis pulses are 2+ on the right side and 2+ on the left side.     Heart sounds: Normal heart sounds. No murmur. No friction rub. No gallop.   Pulmonary:     Effort: Pulmonary effort is normal.     Breath sounds: Normal breath sounds.     Comments: Lungs clear to auscultation bilaterally.  Symmetric chest rise.  No wheezing, rales, rhonchi. Abdominal:     Palpations: Abdomen is soft. Abdomen is not rigid.     Tenderness: There is no abdominal tenderness. There is no guarding.     Comments: Abdomen is soft, non-distended, non-tender. No rigidity, No guarding. No peritoneal signs.  Musculoskeletal:        General: Normal range of motion.     Cervical back: Full passive range of motion without pain.       Legs:     Comments: Tenderness to palpation noted to the posterior aspect of the LLE with some mild overlying soft tissue swelling. Mild tenderness noted to the left calf.   Skin:    General: Skin is warm and dry.     Capillary Refill: Capillary refill takes less than 2 seconds.     Comments: Cranial nerves III-XII intact Follows commands, Moves all extremities  5/5 strength to BUE and BLE  Sensation intact throughout all major nerve distributions No gait abnormalities  No slurred speech. No facial droop.   Neurological:     Mental Status: She is alert and oriented to person, place, and time.  Psychiatric:        Speech: Speech normal.     ED Results / Procedures / Treatments   Labs (all labs ordered are listed, but only abnormal results are displayed) Labs Reviewed  BASIC METABOLIC PANEL - Abnormal; Notable for the  following components:      Result Value   Glucose, Bld 101 (*)    All other components within normal limits  CBC WITH DIFFERENTIAL/PLATELET  POC SARS CORONAVIRUS 2 AG -  ED  TROPONIN I (HIGH SENSITIVITY)  TROPONIN I (HIGH SENSITIVITY)    EKG EKG Interpretation  Date/Time:  Wednesday September 03 2019 09:48:49 EST Ventricular Rate:  81 PR Interval:  144 QRS Duration: 84 QT Interval:  372 QTC Calculation: 432 R Axis:   45 Text Interpretation: Normal sinus rhythm Nonspecific ST abnormality Abnormal ECG No significant change since last tracing Confirmed by Wandra Arthurs [id number]) on 09/03/2019 11:08:36 AM   Radiology CT Angio Chest PE W and/or Wo Contrast  Result Date: 09/03/2019 CLINICAL DATA:  Shortness of breath EXAM: CT ANGIOGRAPHY CHEST WITH CONTRAST TECHNIQUE: Multidetector CT imaging of the chest was  performed using the standard protocol during bolus administration of intravenous contrast. Multiplanar CT image reconstructions and MIPs were obtained to evaluate the vascular anatomy. CONTRAST:  66mL OMNIPAQUE IOHEXOL 350 MG/ML SOLN COMPARISON:  None. FINDINGS: Cardiovascular: Satisfactory opacification of the pulmonary arteries to the segmental level. No evidence of pulmonary embolism. Normal heart size. No pericardial effusion. Mild aorta atherosclerosis. Mediastinum/Nodes: No enlarged mediastinal, hilar, or axillary lymph nodes. Thyroid gland, trachea, and esophagus demonstrate no significant findings. Lungs/Pleura: Centrilobular and paraseptal emphysema. There is no consolidation. No pleural effusion or pneumothorax. Upper Abdomen: No acute abnormality. Musculoskeletal: Mild degenerative changes of the thoracic spine. Review of the MIP images confirms the above findings. IMPRESSION: No evidence of acute pulmonary embolism. Aortic Atherosclerosis (ICD10-I70.0). Electronically Signed   By: Macy Mis M.D.   On: 09/03/2019 15:03   DG Chest Portable 1 View  Result Date:  09/03/2019 CLINICAL DATA:  Shortness of breath EXAM: PORTABLE CHEST 1 VIEW COMPARISON:  March 02, 2018 FINDINGS: Lungs are clear. Heart size and pulmonary vascularity are normal. No adenopathy. No bone lesions. No pneumothorax. IMPRESSION: No edema or consolidation.  Cardiac silhouette within normal limits. Electronically Signed   By: Lowella Grip III M.D.   On: 09/03/2019 10:54   VAS [place] LOWER EXTREMITY VENOUS (DVT) (MC and WL 7a-7p)  Result Date: 09/03/2019  Lower Venous Study Indications: Swelling, and Pain.  Comparison Study: No priors. Performing Technologist: Oda Cogan RDMS, RVT  Examination Guidelines: A complete evaluation includes B-mode imaging, spectral Doppler, color Doppler, and power Doppler as needed of all accessible portions of each vessel. Bilateral testing is considered an integral part of a complete examination. Limited examinations for reoccurring indications may be performed as noted.  +-----+---------------+---------+-----------+----------+--------------+ RIGHTCompressibilityPhasicitySpontaneityPropertiesThrombus Aging +-----+---------------+---------+-----------+----------+--------------+ CFV  Full           Yes      Yes                                 +-----+---------------+---------+-----------+----------+--------------+ SFJ  Full                                                        +-----+---------------+---------+-----------+----------+--------------+   +------------+---------------+---------+-----------+----------+--------------+ LEFT        CompressibilityPhasicitySpontaneityPropertiesThrombus Aging +------------+---------------+---------+-----------+----------+--------------+ CFV         Full           Yes      Yes                                 +------------+---------------+---------+-----------+----------+--------------+ SFJ         Full                                                         +------------+---------------+---------+-----------+----------+--------------+ FV Prox     Full                                                        +------------+---------------+---------+-----------+----------+--------------+  FV Mid      Full                                                        +------------+---------------+---------+-----------+----------+--------------+ FV Distal   Full                                                        +------------+---------------+---------+-----------+----------+--------------+ PFV         Full                                                        +------------+---------------+---------+-----------+----------+--------------+ POP         Full           Yes      Yes                                 +------------+---------------+---------+-----------+----------+--------------+ PTV         Full                                                        +------------+---------------+---------+-----------+----------+--------------+ PERO        Full                                                        +------------+---------------+---------+-----------+----------+--------------+ VaricositiesNone                                         Acute          +------------+---------------+---------+-----------+----------+--------------+ superficial vein thrombosis in the proximal, posterior calf varicose veins.    Summary: Right: No evidence of common femoral vein obstruction. Left: Findings consistent with acute superficial vein thrombosis involving the left varicosities or other superficial veins. There is no evidence of deep vein thrombosis in the lower extremity.  *See table(s) above for measurements and observations.    Preliminary     Procedures Procedures (including critical care time)  Medications Ordered in ED Medications  iohexol (OMNIPAQUE) 350 MG/ML injection 80 mL (80 mLs Intravenous Contrast Given 09/03/19  1446)    ED Course  I have reviewed the triage vital signs and the nursing notes.  Pertinent labs & imaging results that were available during my care of the patient were reviewed by me and considered in my medical decision making (see chart for details).    MDM Rules/Calculators/A&P HEAR Score: 4  63 year old female with past medical history of brain aneurysm, breast cancer (currently cancer free), NSTEMI, hepatitis C who presents for evaluation of fatigue, feeling like a fluttering sensation in her chest as well as some shortness of breath that has been ongoing for last 2 days.  She describes the pain is worse with deep inspiration.  She states that it is woken her up at night.  She also reports some pain and swelling to the posterior aspect of her left lower extremity.  No no other PE risk factors.  On initial ED arrival, she is afebrile, nontoxic-appearing.  Vital signs are stable.  On exam, she does have some swelling and an area of knot to her posterior left leg.  We will plan to check ultrasound for evaluation.  Additionally, plan to check labs.  Doubt ACS etiology as the story sounds atypical but given her risk factors, will plan for troponin and EKG.  Also consider Covid.  Initial Covid negative.  CBC shows no leukocytosis or anemia.  BMP is unremarkable.  Initial troponin is negative.  Chest x-ray unremarkable.  Her ultrasound shows superficial vein thrombosis.  No evidence of DVT in the left lower extremity.  Given her history/risk factors, she has a heart score 4.  We will plan to delta troponin.  Describes a fluttering sensation that she has had at home.  While in the ED, she has not had any of this fluttering sensation.  She has been on the monitor which showed normal sinus rhythm.  She does have evidence of superficial clot.  Given that she has shortness of breath, will plan for evaluation of potential PE.  CTA negative for any acute PE.  Delta troponin  negative.  Discussed results with patient.  We will plan to put her on naproxen and encouraged aspirin for treatment of superficial vein thrombosis.  At this time, given to troponins that are negative as well as EKG, do not feel this is representative of ACS.  Additionally, her story sounds atypical.  I instructed patient to follow-up with her cardiologist, for potentially getting a Holter monitor evaluate fluttering sensation.  I did offer patient a send out Covid test given sometimes negative rapid Covid test.  Patient declined.  Patient is hemodynamically stable with no evidence of hypoxia or tachycardia. At this time, patient exhibits no emergent life-threatening condition that require further evaluation in ED or admission. Discussed patient with Dr. Darl Householder who is agreeable. Patient had ample opportunity for questions and discussion. All patient's questions were answered with full understanding. Strict return precautions discussed. Patient expresses understanding and agreement to plan.  Portions of this note were generated with Lobbyist. Dictation errors may occur despite best attempts at proofreading.  Final Clinical Impression(s) / ED Diagnoses Final diagnoses:  Superficial vein thrombosis  Atypical chest pain    Rx / DC Orders ED Discharge Orders         Ordered    naproxen (NAPROSYN) 500 MG tablet  Daily     09/03/19 1525           Desma Mcgregor 09/03/19 1535    Drenda Freeze, MD 09/04/19 [phone]

## 2019-09-03 NOTE — ED Notes (Signed)
Vascular at bedside

## 2019-09-03 NOTE — ED Notes (Signed)
Pt verbalized understanding of discharge instructions.Follow up care reviewed, pt ambulated independently to lobby.

## 2019-09-03 NOTE — Discharge Instructions (Addendum)
As we discussed, your ultrasound showed a superficial vein thrombosis.  Take naproxen as directed.  Additionally, continue taking your aspirin.  As we discussed today, your lab work was reassuring.  You will need to consult with your cardiologist doctor or your primary care doctor to get a Holter monitor to monitor this fluttering sensation that you feel.   Return to the emergency department for any difficulty breathing, chest pain, fevers or any other worsening or concerning symptoms.

## 2019-09-03 NOTE — Progress Notes (Signed)
Left lower ext venous  has been completed. Refer to [facility] under chart review to view preliminary results.   09/03/2019  12:01 PM [name], Bonnye Fava

## 2019-09-03 NOTE — ED Notes (Signed)
Upon standing, pt's O2 sat decreased to 94%, increased back up to 98% while ambulating on RA

## 2019-09-03 NOTE — ED Notes (Signed)
Pt refused COVID swab

## 2019-09-03 NOTE — ED Triage Notes (Addendum)
Palpitations 2 days ago heaviness in chest  and she has since  Has been sob with least activity , states she had  The back of her leg was red a few days ago

## 2019-09-03 NOTE — ED Notes (Signed)
Patient transported to CT 

## 2019-10-09 ENCOUNTER — Other Ambulatory Visit: Payer: Self-pay | Admitting: Adult Health

## 2019-10-09 ENCOUNTER — Other Ambulatory Visit: Payer: Self-pay | Admitting: Cardiovascular Disease

## 2019-10-21 LAB — COMPREHENSIVE METABOLIC PANEL
ALT: 18 IU/L (ref 0–32)
AST: 13 IU/L (ref 0–40)
Albumin/Globulin Ratio: 2.1 (ref 1.2–2.2)
Albumin: 4.1 g/dL (ref 3.8–4.8)
Alkaline Phosphatase: 77 IU/L (ref 39–117)
BUN/Creatinine Ratio: 20 (ref 12–28)
BUN: 14 mg/dL (ref 8–27)
Bilirubin Total: 0.4 mg/dL (ref 0.0–1.2)
CO2: 22 mmol/L (ref 20–29)
Calcium: 9.4 mg/dL (ref 8.7–10.3)
Chloride: 105 mmol/L (ref 96–106)
Creatinine, Ser: 0.7 mg/dL (ref 0.57–1.00)
GFR calc Af Amer: 107 mL/min/{1.73_m2} (ref >59)
GFR calc non Af Amer: 93 mL/min/{1.73_m2} (ref >59)
Globulin, Total: 2 g/dL (ref 1.5–4.5)
Glucose: 89 mg/dL (ref 65–99)
Potassium: 4.3 mmol/L (ref 3.5–5.2)
Sodium: 142 mmol/L (ref 134–144)
Total Protein: 6.1 g/dL (ref 6.0–8.5)

## 2019-10-21 LAB — LIPID PANEL
Chol/HDL Ratio: 3.8 ratio (ref 0.0–4.4)
Cholesterol, Total: 272 mg/dL — ABNORMAL HIGH (ref 100–199)
HDL: 71 mg/dL (ref >39)
LDL Chol Calc (NIH): 176 mg/dL — ABNORMAL HIGH (ref 0–99)
Triglycerides: 140 mg/dL (ref 0–149)
VLDL Cholesterol Cal: 25 mg/dL (ref 5–40)

## 2019-10-23 ENCOUNTER — Encounter: Payer: Self-pay | Admitting: Cardiovascular Disease

## 2019-10-23 ENCOUNTER — Other Ambulatory Visit: Payer: Self-pay

## 2019-10-23 ENCOUNTER — Ambulatory Visit (INDEPENDENT_AMBULATORY_CARE_PROVIDER_SITE_OTHER): Payer: Medicaid Other | Admitting: Cardiovascular Disease

## 2019-10-23 VITALS — BP 128/76 | HR 72 | Temp 97.6°F | Ht 66.0 in | Wt 177.4 lb

## 2019-10-23 DIAGNOSIS — Z720 Tobacco use: Secondary | ICD-10-CM

## 2019-10-23 DIAGNOSIS — Z79899 Other long term (current) drug therapy: Secondary | ICD-10-CM | POA: Diagnosis not present

## 2019-10-23 DIAGNOSIS — E7800 Pure hypercholesterolemia, unspecified: Secondary | ICD-10-CM | POA: Diagnosis not present

## 2019-10-23 DIAGNOSIS — I214 Non-ST elevation (NSTEMI) myocardial infarction: Secondary | ICD-10-CM

## 2019-10-23 DIAGNOSIS — I10 Essential (primary) hypertension: Secondary | ICD-10-CM

## 2019-10-23 DIAGNOSIS — I639 Cerebral infarction, unspecified: Secondary | ICD-10-CM | POA: Diagnosis not present

## 2019-10-23 DIAGNOSIS — I5181 Takotsubo syndrome: Secondary | ICD-10-CM

## 2019-10-23 HISTORY — DX: Pure hypercholesterolemia, unspecified: E78.00

## 2019-10-23 MED ORDER — LISINOPRIL 10 MG PO TABS: 10.0000 mg | ORAL_TABLET | Freq: Every day | ORAL | 1 refills | Status: DC

## 2019-10-23 NOTE — Progress Notes (Signed)
Cardiology Office Note   Date:  10/23/2019   ID:  [name], DOB 05/25/1956, MRN [id number]  PCP:  Aura Dials, PA-C  Cardiologist:   Skeet Latch, MD   No chief complaint on file.   History of Present Illness: [name] is a 64 y.o. female  with Takatsubo cardiomyopathy, hypertension, hyperlipidemia, prior stroke, ongoing tobacco abuse, and breast cancer status post XRT.    She was admitted 12/2017 with chest pain and was found to have Takotsubo cardiomyopathy.  Echo that admission revealed LVEF 50 to 55% with grade 1 diastolic dysfunction and hypokinesis of the mid to apical inferolateral and inferior myocardium.  She underwent left heart catheterization 12/24/2017 that revealed 10% mid RCA stenosis and LVEF was 25 to 35% by left ventriculography.  She was started on metoprolol and no ACE inhibitor/ARB due to low blood pressure.  She followed up with Kerin Ransom, PA-C and was doing well.  She was started on lisinopril.  She reported cough since starting lisinopril so it was switched to losartan.  However, her cough persisted and the losartan made her feel poorly so she switched back to lisinopril.     Since her last appointment Ms. Lindner was seen in the ED 08/2019 with shortness of breath and atypical chest pain.  She also had pain and swelling in her leg.  Cardiac enzymes were negative.  EKG was unremarkable.  She was found to have a superficial venous thrombosis of a varicose vein.  She was prescribed naproxen and instructed to follow-up with her cardiologist.  Since then she has been wearing compression stockings.  On that day she had heart palpitations that have since subsided.  She hasn't had any recent chest pain.  She was caring for her brother with dementia.  Recently she had to put him in an assisted living facility.  This has helped her stress level.  She has been trying to quit smoking.  She quit for 14 days while on quarantine.  She was prescribed Zetia but has not  started taking it yet.  She is willing to give it a try.   Past Medical History:  Diagnosis Date  . Adjustment disorder with mixed anxiety and depressed mood 01/05/2013  . Allergy   . Anxiety   . Arthritis    "knees,wrists" (07/31/2014)  . Brain aneurysm 2012   2cms.  . Breast cancer ([place]) 06/05/2013  . Breast disorder    lump in left breast  . Carcinoid tumor   . Complication of anesthesia    hallucinations , anxiety   . Depression   . H/O echocardiogram    not finding report in EPIC, pt. reports that it was before her appt. /w Dr. Johnsie Cancel   . Hepatitis B dx'd ~ 1980  . Hepatitis C dx'd ~ 2000   "specialist says it cleared itself on it's own"  . Hx of radiation therapy 07/17/13-09/03/13   left breast,60.4Gy, 32/33 fx completed  . Hyperlipidemia   . Hypertension    "never took my RX" (07/31/2014)  . Jaundice   . Malignant neoplasm of lower-outer quadrant of female breast ([place]) 05/19/2013  . Pain management    goes to [facility] clinic, but she doesn't intend to return   . Personal history of radiation therapy    2014  . Pure hypercholesterolemia 10/23/2019  . Stroke [facility]) 2012   TIA  . TIA (transient ischemic attack) 06/2011   "can't see real well out of left eye since;  weaker on left side since"  . Varicose veins   . Walking pneumonia ~ 2008    Past Surgical History:  Procedure Laterality Date  . BREAST BIOPSY Left 03/2013; 05/2013  . BREAST LUMPECTOMY Left 04/02/2013   Procedure: Excision  of Left Breast Mass;  Surgeon: Harl Bowie, MD;  Location: [place];  Service: General;  Laterality: Left;  . BREAST LUMPECTOMY WITH SENTINEL LYMPH NODE BIOPSY Left 06/05/2013   Procedure: BREAST LUMPECTOMY WITH SENTINEL LYMPH NODE BX;  Surgeon: Harl Bowie, MD;  Location: [place];  Service: General;  Laterality: Left;  . CARDIAC CATHETERIZATION  12/24/2017  . COLON SURGERY  03/2013   bowel obstruction , malignant tumor    . DILATION AND CURETTAGE OF UTERUS  ~ 1990   S/P  miscarriage  . EVACUATION BREAST HEMATOMA Left 06/05/2013   Procedure: EVACUATION HEMATOMA BREAST;  Surgeon: Harl Bowie, MD;  Location: [place];  Service: General;  Laterality: Left;  . LAPAROSCOPY N/A 04/02/2013   Procedure: Diaganostic Lap.; Small Bowel Resection;  Surgeon: Harl Bowie, MD;  Location: [place];  Service: General;  Laterality: N/A;  . LEFT HEART CATH AND CORONARY ANGIOGRAPHY N/A 12/24/2017   Procedure: LEFT HEART CATH AND CORONARY ANGIOGRAPHY;  Surgeon: Jettie Booze, MD;  Location: [place] CV [facility];  Service: Cardiovascular;  Laterality: N/A;  . TUMOR EXCISION  04/02/2013   "carcoid tumor removed"  . vaginal births   60    birthed twins at -[redacted] weeks gestation - both survived , total of 3 pregnancies - all born vaginally     Current Outpatient Medications  Medication Sig Dispense Refill  . acetaminophen (TYLENOL) 500 MG tablet Take 500 mg by mouth 2 (two) times daily as needed (for back and knee pain).     Marland Kitchen ALPRAZolam (XANAX) 1 MG tablet Take 1 tablet (1 mg total) by mouth 2 (two) times daily as needed for anxiety. 45 tablet 0  . aspirin EC 81 MG tablet Take 81 mg by mouth daily.    Marland Kitchen lisinopril (ZESTRIL) 10 MG tablet Take 1 tablet (10 mg total) by mouth daily. 90 tablet 1  . metoprolol succinate (TOPROL-XL) 25 MG 24 hr tablet Take 0.5 tablets (12.5 mg total) by mouth daily. 45 tablet 1  . nicotine (NICODERM CQ) 7 mg/24hr patch Place 1 patch (7 mg total) onto the skin daily. FOR 2 WEEKS. TO BE USED ONCE YOU COMPLETE THE 1 MONTH TREATMENT 14 patch 0  . nitroGLYCERIN (NITROSTAT) 0.4 MG SL tablet Place 1 tablet (0.4 mg total) under the tongue every 5 (five) minutes x 3 doses as needed for chest pain. 25 tablet 1  . oseltamivir (TAMIFLU) 75 MG capsule Take 1 capsule (75 mg total) by mouth every 12 (twelve) hours. 10 capsule 0  . tiZANidine (ZANAFLEX) 2 MG tablet Take 1 tablet (2 mg total) by mouth at bedtime. 20 tablet 0  . ezetimibe (ZETIA) 10 MG tablet Take  1 tablet (10 mg total) by mouth daily. 90 tablet 3  . naproxen (NAPROSYN) 500 MG tablet Take 1 tablet (500 mg total) by mouth daily. (Patient not taking: Reported on 10/23/2019) 20 tablet 0   No current facility-administered medications for this visit.    Allergies:   Statins, Tramadol, Ace inhibitors, Prozac [fluoxetine], Wellbutrin [bupropion], Ciprofloxacin hcl, and Sertraline hcl    Social History:  The patient  reports that she has been smoking cigarettes. She has a 18.00 pack-year smoking history. She has never used smokeless  tobacco. She reports current alcohol use of about 3.0 standard drinks of alcohol per week. She reports current drug use. Drug: Marijuana.   Family History:  The patient's family history includes Arrhythmia in her sister; Cancer in her father, mother, and sister; Diabetes in her brother, mother, paternal grandfather, paternal grandmother, and sister; Heart disease in her brother, mother, and sister; Stroke in her brother.    ROS:  Please see the history of present illness.   Otherwise, review of systems are positive for none.   All other systems are reviewed and negative.    PHYSICAL EXAM: VS:  BP 128/76   Pulse 72   Temp 97.6 F (36.4 C)   Ht 5\' 6"  (1.676 m)   Wt 177 lb 6.4 oz (80.5 kg)   SpO2 97%   BMI 28.63 kg/m  , BMI Body mass index is 28.63 kg/m. GENERAL:  Well appearing.  Anxious HEENT: Pupils equal round and reactive, fundi not visualized, oral mucosa unremarkable NECK:  No jugular venous distention, waveform within normal limits, carotid upstroke brisk and symmetric, no bruits, no thyromegaly LUNGS:  Clear to auscultation bilaterally HEART:  RRR.  PMI not displaced or sustained,S1 and S2 within normal limits, no S3, no S4, no clicks, no rubs, no murmurs ABD:  Flat, positive bowel sounds normal in frequency in pitch, no bruits, no rebound, no guarding, no midline pulsatile mass, no hepatomegaly, no splenomegaly EXT:  2 plus pulses throughout, no  edema, no cyanosis no clubbing SKIN:  No rashes no nodules NEURO:  Cranial nerves II through XII grossly intact, motor grossly intact throughout PSYCH:  Cognitively intact, oriented to person place and time   EKG:  EKG is ordered today. 10/23/2019: Sinus rhythm.  Rate 72 bpm.  Recent Labs: 09/03/2019: Hemoglobin 13.6; Platelets 219 10/21/2019: ALT 18; BUN 14; Creatinine, Ser 0.70; Potassium 4.3; Sodium 142   Echo 12/24/17: Study Conclusions - Left ventricle: Longitudinal global LV strain is borderlin abnormal at -15% with the abnormality primarly in the inferolateral wall. The cavity size was normal. There was mild focal basal hypertrophy of the septum. Systolic function was normal. The estimated ejection fraction was in the range of 50% to 55%. There is hypokinesis of the mid-apicalinferolateral and inferior myocardium. There was an increased relative contribution of atrial contraction to ventricular filling. Doppler parameters are consistent with abnormal left ventricular relaxation (grade 1 diastolic dysfunction). - Aortic valve: Mildly to moderately calcified annulus. Trileaflet; normal thickness leaflets. There was trivial regurgitation. - Mitral valve: Calcified annulus. - Pulmonic valve: There was trivial regurgitation.  Impressions: - Compared to prior echo, inferior and inferolateral wall motion abnormalities are new.   LHC 12/24/17:  Mid RCA lesion is 10% stenosed.  LV end diastolic pressure is mildly elevated.  There is moderate to severe left ventricular systolic dysfunction in the pattern of Takotsubo cardiomyopathy  The left ventricular ejection fraction is 25-35% by visual estimate.  There is no aortic valve stenosis.   Lipid Panel    Component Value Date/Time   CHOL 272 (H) 10/21/2019 1059   TRIG 140 10/21/2019 1059   HDL 71 10/21/2019 1059   CHOLHDL 3.8 10/21/2019 1059   CHOLHDL 2.6 12/24/2017 0953   VLDL 26 12/24/2017 0953     LDLCALC 176 (H) 10/21/2019 1059      Wt Readings from Last 3 Encounters:  10/23/19 177 lb 6.4 oz (80.5 kg)  03/03/19 172 lb (78 kg)  10/03/18 171 lb (77.6 kg)      ASSESSMENT AND  PLAN:  # Takotsubo cardiomyopathy:  # Hypertension: Systolic function has normalized.  BP controlled on metoprolol and lisinopril.   # Tobacco abuse: Ms. Deliman is able to quit smoking with patches but then starts back when she gets stressed.  Wellbutrin didn't work.  She is smoking 4-5/day.  She is still motivated to keep trying.  # Prior stroke: Continue aspirin. LDL goal<70.  She is going to start Zetia.  Repeat lipids/CMP in 3 months.  If >70 she wil need to start PCSK9 inhibitor.     Current medicines are reviewed at length with the patient today.  The patient does not have concerns regarding medicines.  The following changes have been made: Start Zetia  Labs/ tests ordered today include:   Orders Placed This Encounter  Procedures  . Lipid panel  . Comprehensive metabolic panel  . EKG 12-Lead     Disposition:   FU with Tajae Maiolo C. Oval Linsey, MD, [facility] in 1 year.      Signed, Amyjo Mizrachi C. Oval Linsey, MD, [facility]  10/23/2019 12:35 PM    [facility]

## 2019-10-23 NOTE — Patient Instructions (Signed)
Medication Instructions:  START ZETIA 10 MG DAILY   *If you need a refill on your cardiac medications before your next appointment, please call your pharmacy*  Lab Work: FASTING LP/CMET IN 3 MONTHS   If you have labs (blood work) drawn today and your tests are completely normal, you will receive your results only by: Marland Kitchen MyChart Message (if you have MyChart) OR . A paper copy in the mail If you have any lab test that is abnormal or we need to change your treatment, we will call you to review the results.  Testing/Procedures: NONE  Follow-Up: At [facility], you and your health needs are our priority.  As part of our continuing mission to provide you with exceptional heart care, we have created designated Provider Care Teams.  These Care Teams include your primary Cardiologist (physician) and Advanced Practice Providers (APPs -  Physician Assistants and Nurse Practitioners) who all work together to provide you with the care you need, when you need it.  Your next appointment:   12 month(s)  The format for your next appointment:   Either In Person or Virtual  Provider:   You may see Skeet Latch, MD or one of the following Advanced Practice Providers on your designated Care Team:    Kerin Ransom, PA-C  [place], [place]  Coletta Memos, [place]

## 2019-10-24 ENCOUNTER — Other Ambulatory Visit: Payer: Self-pay | Admitting: Hematology and Oncology

## 2019-10-24 DIAGNOSIS — Z1231 Encounter for screening mammogram for malignant neoplasm of breast: Secondary | ICD-10-CM

## 2019-12-04 ENCOUNTER — Ambulatory Visit: Payer: Medicaid Other | Attending: Internal Medicine

## 2019-12-04 DIAGNOSIS — Z23 Encounter for immunization: Secondary | ICD-10-CM

## 2019-12-04 NOTE — Progress Notes (Signed)
   Covid-19 Vaccination Clinic  Name:  VERNE SECCOMBE    MRN: [id number] DOB: 1955/10/04  12/04/2019  Ms. Guidroz was observed post Covid-19 immunization for 15 minutes without incident. She was provided with Vaccine Information Sheet and instruction to access the V-Safe system.   Ms. Semar was instructed to call 911 with any severe reactions post vaccine: Marland Kitchen Difficulty breathing  . Swelling of face and throat  . A fast heartbeat  . A bad rash all over body  . Dizziness and weakness   Immunizations Administered    Name Date Dose VIS Date Route   Pfizer COVID-19 Vaccine 12/04/2019  2:53 PM 0.3 mL 08/22/2019 Intramuscular   Manufacturer: [place]   Lot: [id number]   [place]: [id number]

## 2019-12-29 ENCOUNTER — Ambulatory Visit: Payer: Medicaid Other

## 2019-12-29 ENCOUNTER — Ambulatory Visit (HOSPITAL_COMMUNITY)
Admission: EM | Admit: 2019-12-29 | Discharge: 2019-12-29 | Disposition: A | Discharge status: Home / Self Care | Payer: Medicaid Other | Attending: Family Medicine | Admitting: Family Medicine

## 2019-12-29 DIAGNOSIS — L03116 Cellulitis of left lower limb: Secondary | ICD-10-CM

## 2019-12-29 DIAGNOSIS — M79605 Pain in left leg: Secondary | ICD-10-CM

## 2019-12-29 MED ORDER — CEPHALEXIN 500 MG PO CAPS: 500.0000 mg | ORAL_CAPSULE | Freq: Four times a day (QID) | ORAL | 0 refills | Status: DC

## 2019-12-29 MED ORDER — HYDROCODONE-ACETAMINOPHEN 5-325 MG PO TABS: 1.0000 | ORAL_TABLET | Freq: Four times a day (QID) | ORAL | 0 refills | Status: DC | PRN

## 2019-12-29 NOTE — ED Triage Notes (Signed)
Pt c/o pain and swelling to L leg on Friday, has improved since then but bruising still present. Also c/o "redness and it feels like a knot" to groin area.

## 2019-12-29 NOTE — Discharge Instructions (Addendum)
Take the antibiotic 4 times a day for the next 2 days Then may reduce to 2 x a day Take for 10 full days Take the pain medicine as needed Do not drive on the pain medicine Elevate leg and use warm compresses Return if worse at any time, if you develop fever or body aches

## 2019-12-29 NOTE — ED Provider Notes (Signed)
[place]    CSN: [id number] Arrival date & time: 12/29/19  1010      History   Chief Complaint Chief Complaint  Patient presents with  . Leg Pain  . Abscess    HPI [name] is a 64 y.o. female.   HPI  Patient has redness and pain in her left leg.  She states it started on Friday.  At first she noticed a "bruise" discolored area on her left anterior leg above the ankle.  After this she got it deeply erythematous periphery above and below the original bruise.  She states this caused her ankle to swell.  It was painful.  She elevate her leg and took an anti-inflammatory.  I believe it is meloxicam by her history.  This got slightly better but then she developed pain behind her knee and redness and swelling in her groin.  She thinks this is a lymph node.  She identifies this is a infection in her leg.  She spoke with a family member who is a Marine scientist.  She was told it was probably cellulitis.  She was urged to come in for medical care.  She is not having any fever chills or malaise.  Past Medical History:  Diagnosis Date  . Adjustment disorder with mixed anxiety and depressed mood 01/05/2013  . Allergy   . Anxiety   . Arthritis    "knees,wrists" (07/31/2014)  . Brain aneurysm 2012   2cms.  . Breast cancer ([place]) 06/05/2013  . Breast disorder    lump in left breast  . Carcinoid tumor   . Complication of anesthesia    hallucinations , anxiety   . Depression   . H/O echocardiogram    not finding report in EPIC, pt. reports that it was before her appt. /w Dr. Johnsie Cancel   . Hepatitis B dx'd ~ 1980  . Hepatitis C dx'd ~ 2000   "specialist says it cleared itself on it's own"  . Hx of radiation therapy 07/17/13-09/03/13   left breast,60.4Gy, 32/33 fx completed  . Hyperlipidemia   . Hypertension    "never took my RX" (07/31/2014)  . Jaundice   . Malignant neoplasm of lower-outer quadrant of female breast ([place]) 05/19/2013  . Pain management    goes to [facility] clinic, but  she doesn't intend to return   . Personal history of radiation therapy    2014  . Pure hypercholesterolemia 10/23/2019  . Stroke [facility]) 2012   TIA  . TIA (transient ischemic attack) 06/2011   "can't see real well out of left eye since; weaker on left side since"  . Varicose veins   . Walking pneumonia ~ 2008    Patient Active Problem List   Diagnosis Date Noted  . Pure hypercholesterolemia 10/23/2019  . Takotsubo cardiomyopathy 12/25/2017  . Cerebrovascular accident (CVA) ([place])   . 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 Elevated troponin   . NSTEMI (non-ST elevated myocardial infarction) ([place]) 12/23/2017  . Head lice infestation [id number]  . Chest pain 07/31/2014  . 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 Partial small bowel obstruction ([place]) 04/07/2013  . SBO (small bowel obstruction) ([place]) 01/05/2013  . Adjustment disorder with mixed anxiety and depressed mood 01/05/2013  . Chronic pain syndrome 01/05/2013  . Thyroid cyst 01/05/2013  . Tobacco abuse 12/05/2012  . Hepatitis C   . Jaundice   .  Varicose veins   . Brain aneurysm   . TIA (transient ischemic attack) 06/12/2011    Past Surgical History:  Procedure Laterality Date  . BREAST BIOPSY Left 03/2013; 05/2013  . BREAST LUMPECTOMY Left 04/02/2013   Procedure: Excision  of Left Breast Mass;  Surgeon: Harl Bowie, MD;  Location: [place];  Service: General;  Laterality: Left;  . BREAST LUMPECTOMY WITH SENTINEL LYMPH NODE BIOPSY Left 06/05/2013   Procedure: BREAST LUMPECTOMY WITH SENTINEL LYMPH NODE BX;  Surgeon: Harl Bowie, MD;  Location: [place];  Service: General;  Laterality: Left;  . CARDIAC CATHETERIZATION  12/24/2017  . COLON SURGERY  03/2013   bowel obstruction , malignant tumor    . DILATION AND CURETTAGE OF UTERUS  ~ 1990   S/P miscarriage  . EVACUATION BREAST HEMATOMA Left 06/05/2013   Procedure:  EVACUATION HEMATOMA BREAST;  Surgeon: Harl Bowie, MD;  Location: [place];  Service: General;  Laterality: Left;  . LAPAROSCOPY N/A 04/02/2013   Procedure: Diaganostic Lap.; Small Bowel Resection;  Surgeon: Harl Bowie, MD;  Location: [place];  Service: General;  Laterality: N/A;  . LEFT HEART CATH AND CORONARY ANGIOGRAPHY N/A 12/24/2017   Procedure: LEFT HEART CATH AND CORONARY ANGIOGRAPHY;  Surgeon: Jettie Booze, MD;  Location: [place] CV LAB;  Service: Cardiovascular;  Laterality: N/A;  . TUMOR EXCISION  04/02/2013   "carcoid tumor removed"  . vaginal births   54    birthed twins at -[redacted] weeks gestation - both survived , total of 3 pregnancies - all born vaginally    OB History    Gravida  5   Para  4   Term      Preterm  2   AB  1   Living  4     SAB      TAB  1   Ectopic      Multiple      Live Births               Home Medications    Prior to Admission medications   Medication Sig Start Date End Date Taking? Authorizing Provider  acetaminophen (TYLENOL) 500 MG tablet Take 500 mg by mouth 2 (two) times daily as needed (for back and knee pain).     [provider]  ALPRAZolam Duanne Moron) 1 MG tablet Take 1 tablet (1 mg total) by mouth 2 (two) times daily as needed for anxiety. 06/04/15   Nicholas Lose, MD  aspirin EC 81 MG tablet Take 81 mg by mouth daily.    [provider]  cephALEXin (KEFLEX) 500 MG capsule Take 1 capsule (500 mg total) by mouth 4 (four) times daily. 12/29/19   Raylene Everts, MD  ezetimibe (ZETIA) 10 MG tablet Take 1 tablet (10 mg total) by mouth daily. 01/15/19 04/15/19  Skeet Latch, MD  HYDROcodone-acetaminophen (NORCO/VICODIN) 5-325 MG tablet Take 1-2 tablets by mouth every 6 (six) hours as needed. 12/29/19   Raylene Everts, MD  lisinopril (ZESTRIL) 10 MG tablet Take 1 tablet (10 mg total) by mouth daily. 10/23/19   Skeet Latch, MD  metoprolol succinate (TOPROL-XL) 25 MG 24 hr tablet Take 0.5  tablets (12.5 mg total) by mouth daily. 10/01/18   Lendon Colonel, NP  nitroGLYCERIN (NITROSTAT) 0.4 MG SL tablet Place 1 tablet (0.4 mg total) under the tongue every 5 (five) minutes x 3 doses as needed for chest pain. 12/25/17   Ledora Bottcher, PA  Family History Family History  Problem Relation Age of Onset  . Diabetes Paternal Grandfather   . Diabetes Paternal Grandmother   . Diabetes Mother   . Heart disease Mother   . Cancer Mother        cervical  . Diabetes Brother   . Stroke Brother   . Heart disease Brother   . Heart disease Sister   . Diabetes Sister   . Cancer Sister        cervical  . Arrhythmia Sister   . Cancer Father        lung    Social History Social History   Tobacco Use  . Smoking status: Current Some Day Smoker    Packs/day: 0.50    Years: 36.00    Pack years: 18.00    Types: Cigarettes    Last attempt to quit: 12/22/2017    Years since quitting: 2.0  . Smokeless tobacco: Never Used  . Tobacco comment: smokes some days, use patch some days  Substance Use Topics  . Alcohol use: Yes    Alcohol/week: 3.0 standard drinks    Types: 3 Glasses of wine per week  . Drug use: Yes    Types: Marijuana    Comment: "last marijuana ~ 06/2014     Allergies   Statins, Tramadol, Ace inhibitors, Prozac [fluoxetine], Wellbutrin [bupropion], Ciprofloxacin hcl, and Sertraline hcl   Review of Systems Review of Systems  Constitutional: Negative for chills and fever.  Cardiovascular: Positive for leg swelling.  Musculoskeletal: Negative for myalgias.  Skin: Positive for color change.     Physical Exam Triage Vital Signs ED Triage Vitals [12/29/19 1039]  Enc Vitals Group     BP (!) 192/86     Pulse Rate 74     Resp 16     Temp 97.8 F (36.6 C)     Temp src      SpO2 96 %     Weight      Height      Head Circumference      Peak Flow      Pain Score 8     Pain Loc      Pain Edu?      Excl. in [place]?    No data found.  Updated Vital  Signs BP (!) 192/86   Pulse 74   Temp 97.8 F (36.6 C)   Resp 16   SpO2 96%      Physical Exam Constitutional:      General: She is not in acute distress.    Appearance: She is well-developed and normal weight. She is not ill-appearing.  HENT:     Head: Normocephalic and atraumatic.     Mouth/Throat:     Comments: Mask in place Eyes:     Conjunctiva/sclera: Conjunctivae normal.     Pupils: Pupils are equal, round, and reactive to light.  Cardiovascular:     Rate and Rhythm: Normal rate.  Pulmonary:     Effort: Pulmonary effort is normal. No respiratory distress.  Abdominal:     General: There is no distension.     Palpations: Abdomen is soft.  Musculoskeletal:        General: Normal range of motion.     Cervical back: Normal range of motion.       Legs:  Skin:    General: Skin is warm and dry.  Neurological:     General: No focal deficit present.     Mental Status: She is  alert.  Psychiatric:        Mood and Affect: Mood normal.        Behavior: Behavior normal.      UC Treatments / Results  Labs (all labs ordered are listed, but only abnormal results are displayed) Labs Reviewed - No data to display  EKG   Radiology No results found.  Procedures Procedures (including critical care time)  Medications Ordered in UC Medications - No data to display  Initial Impression / Assessment and Plan / UC Course  I have reviewed the triage vital signs and the nursing notes.  Pertinent labs & imaging results that were available during my care of the patient were reviewed by me and considered in my medical decision making (see chart for details).    Unclear etiology.  It does appear to be a cellulitis.  A sending infection.  Will treat with antibiotics and warm compresses.  Rest.  Return if not improving in a couple of days. Final Clinical Impressions(s) / UC Diagnoses   Final diagnoses:  Left leg pain  Cellulitis of left lower extremity     Discharge  Instructions     Take the antibiotic 4 times a day for the next 2 days Then may reduce to 2 x a day Take for 10 full days Take the pain medicine as needed Do not drive on the pain medicine Elevate leg and use warm compresses Return if worse at any time, if you develop fever or body aches   ED Prescriptions    Medication Sig Dispense Auth. Provider   cephALEXin (KEFLEX) 500 MG capsule Take 1 capsule (500 mg total) by mouth 4 (four) times daily. 28 capsule Raylene Everts, MD   HYDROcodone-acetaminophen (NORCO/VICODIN) 5-325 MG tablet Take 1-2 tablets by mouth every 6 (six) hours as needed. 15 tablet Raylene Everts, MD     I have reviewed the PDMP during this encounter.   Raylene Everts, MD 12/29/19 Lurena Nida

## 2020-01-11 NOTE — Progress Notes (Signed)
Patient Care Team: Selinda Orion as PCP - General (Physician Assistant) Skeet Latch, MD as PCP - Cardiology (Cardiology)  DIAGNOSIS:    ICD-10-CM   1. Ductal carcinoma in situ (DCIS) of left breast  D05.12   2. 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 with above-mentioned history of left breast DCIS treated with lumpectomy, radiation, and who declined oral anti-estrogen therapy. 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 She presents to the clinic today due to recent left axilla pain.  She is also complaining of left-sided rib discomfort.    ALLERGIES:  is allergic to statins; tramadol; ace inhibitors; prozac [fluoxetine]; wellbutrin [bupropion]; ciprofloxacin hcl; and sertraline hcl.  MEDICATIONS:  Current Outpatient Medications  Medication Sig Dispense Refill  . acetaminophen (TYLENOL) 500 MG tablet Take 500 mg by mouth 2 (two) times daily as needed (for back and knee pain).     Marland Kitchen ALPRAZolam (XANAX) 1 MG tablet Take 1 tablet (1 mg total) by mouth 2 (two) times daily as needed for  anxiety. 45 tablet 0  . aspirin EC 81 MG tablet Take 81 mg by mouth daily.    . cephALEXin (KEFLEX) 500 MG capsule Take 1 capsule (500 mg total) by mouth 4 (four) times daily. 28 capsule 0  . ezetimibe (ZETIA) 10 MG tablet Take 1 tablet (10 mg total) by mouth daily. 90 tablet 3  . HYDROcodone-acetaminophen (NORCO/VICODIN) 5-325 MG tablet Take 1-2 tablets by mouth every 6 (six) hours as needed. 15 tablet 0  . lisinopril (ZESTRIL) 10 MG tablet Take 1 tablet (10 mg total) by mouth daily. 90 tablet 1  . metoprolol succinate (TOPROL-XL) 25 MG 24 hr tablet Take 0.5 tablets (12.5 mg total) by mouth daily. 45 tablet 1  . nitroGLYCERIN (NITROSTAT) 0.4 MG SL tablet Place 1 tablet (0.4 mg total) under the tongue every 5 (five) minutes x 3 doses as needed for chest pain. 25 tablet 1   No current facility-administered medications for this visit.    PHYSICAL EXAMINATION: ECOG PERFORMANCE STATUS: 1 - Symptomatic but completely ambulatory  Vitals:   01/12/20 1421  BP: (!) 144/84  Pulse: 64  Resp: 18  Temp: 97.8 F (36.6 C)  SpO2: 99%   Filed Weights   01/12/20 1421  Weight: 177 lb 6.4 oz (80.5 kg)    BREAST: No palpable masses or nodules in either right or left breasts. No palpable axillary supraclavicular or infraclavicular adenopathy no breast tenderness or nipple discharge. (exam performed in the presence of a chaperone)  LABORATORY DATA:  I have reviewed the data as listed CMP Latest Ref Rng & Units 10/21/2019 09/03/2019 03/03/2019  Glucose 65 - 99 mg/dL 89 101(H) 96  BUN 8 - 27 mg/dL 14 14 19   Creatinine 0.57 - 1.00 mg/dL 0.70 0.76 0.71  Sodium 134 - 144 mmol/L 142 140 138  Potassium 3.5 - 5.2 mmol/L 4.3 3.9 3.7  Chloride 96 - 106 mmol/L 105 107 108  CO2 20 - 29 mmol/L 22 22 18(L)  Calcium 8.7 - 10.3 mg/dL 9.4 9.2 9.1  Total Protein 6.0 - 8.5 g/dL 6.1 - 6.4(L)  Total Bilirubin 0.0 - 1.2 mg/dL 0.4 - 0.7  Alkaline Phos 39 - 117 IU/L 77 - 60  AST 0 - 40 IU/L 13 - 20  ALT 0 - 32 IU/L 18 -  20    Lab Results  Component Value Date   WBC 6.7 09/03/2019   HGB 13.6 09/03/2019   HCT 43.2 09/03/2019   MCV 90.2 09/03/2019   PLT 219 09/03/2019   NEUTROABS 4.5 09/03/2019    ASSESSMENT & PLAN:  Ductal carcinoma in situ (DCIS) of left breast Left breast DCIS 3.5 cm ER/PR positive status post lumpectomy and radiation therapy declined tamoxifen therapy, currently on surveillance  Surveillance: 1. Breast exam 5/3/2021benign 2. Mammogram10/23/2019 stable lumpectomy changes no evidence of malignancy, breast density category C. Patient will need new mammogram  Left chest wall pain: CT scans done December 2020 were normal.  We will obtain a bone scan to further evaluate this. I suspect it is related to previous rib fracture. I will call her with the results of the scan with a MyChart video visit. If the scan is normal, then she can return to clinic in 1 year for surveillance checks and follow-up  Malignant carcinoid tumor of duodenum Status post bowel resection July 2014 at the same time that she had lumpectomy. She has a liver lesion that is being followed. She does not have any symptoms related to carcinoid syndrome and previous chromogranin A levels have all been normal.  Tobacco abuse: Recommended smoking cessation. She has a history of ST elevation MI.    No orders of the defined types were placed in this encounter.  The patient has a good understanding of the overall plan. she agrees with it. she will call with any problems that may develop before the next visit here.  Total time spent: 20 mins including face to face time and time spent for planning, charting and coordination of care  Nicholas Lose, MD 01/12/2020  I, Cloyde Reams Dorshimer, am acting as scribe for Dr. Nicholas Lose.  I have reviewed the above documentation for accuracy and completeness, and I agree with the above.

## 2020-01-12 ENCOUNTER — Other Ambulatory Visit: Payer: Self-pay

## 2020-01-12 ENCOUNTER — Inpatient Hospital Stay: Payer: Medicaid Other | Attending: Hematology and Oncology | Admitting: Hematology and Oncology

## 2020-01-12 DIAGNOSIS — Z170 Estrogen receptor positive status [ER+]: Secondary | ICD-10-CM | POA: Insufficient documentation

## 2020-01-12 DIAGNOSIS — Z86000 Personal history of in-situ neoplasm of breast: Secondary | ICD-10-CM | POA: Diagnosis present

## 2020-01-12 DIAGNOSIS — D0512 Intraductal carcinoma in situ of left breast: Secondary | ICD-10-CM | POA: Diagnosis not present

## 2020-01-12 DIAGNOSIS — Z85068 Personal history of other malignant neoplasm of small intestine: Secondary | ICD-10-CM | POA: Insufficient documentation

## 2020-01-12 DIAGNOSIS — Z79899 Other long term (current) drug therapy: Secondary | ICD-10-CM | POA: Diagnosis not present

## 2020-01-12 DIAGNOSIS — C7A010 Malignant carcinoid tumor of the duodenum: Secondary | ICD-10-CM

## 2020-01-12 DIAGNOSIS — Z923 Personal history of irradiation: Secondary | ICD-10-CM | POA: Insufficient documentation

## 2020-01-12 NOTE — Assessment & Plan Note (Signed)
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 Breast exam 5/3/2021benign 2. Mammogram10/23/2019 stable lumpectomy changes no evidence of malignancy, breast density category C. Patient will need new mammogram  Return to clinic in 1 year for surveillance checks and follow-up

## 2020-01-12 NOTE — Assessment & Plan Note (Signed)
Status post bowel resection July 2014 at the same time that she had lumpectomy. She has a liver lesion that is being followed. She does not have any symptoms related to carcinoid syndrome and previous chromogranin A levels have all been normal.  Tobacco abuse: Recommended smoking cessation. She has a history of ST elevation MI.

## 2020-01-13 ENCOUNTER — Telehealth: Payer: Self-pay | Admitting: Hematology and Oncology

## 2020-01-13 NOTE — Telephone Encounter (Signed)
Scheduled appts per 5/3 los. Pt confirmed appt date and time.

## 2020-01-20 ENCOUNTER — Inpatient Hospital Stay: Payer: Medicaid Other | Admitting: Hematology and Oncology

## 2020-01-20 NOTE — Assessment & Plan Note (Deleted)
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 Breast exam 5/3/2021benign 2. Mammogram10/23/2019stable lumpectomy changes no evidence of malignancy, breast density category C. Patient will need new mammogram  Left chest wall pain: CT scans done December 2020 were normal.     Bone scan scheduled for 01/21/2020. I suspect it is related to previous rib fracture. I will call her with the results of the scan with a MyChart video visit. If the scan is normal, then she can return to clinic in 1 year for surveillance checks and follow-up  Malignant carcinoid tumor of duodenum Status post bowel resection July 2014 at the same time that she had lumpectomy. She has a liver lesion that is being followed. She does not have any symptoms related to carcinoid syndrome and previous chromogranin A levels have all been normal.  Tobacco abuse: Recommended smoking cessation. She has a history of ST elevation MI.

## 2020-01-21 ENCOUNTER — Encounter (HOSPITAL_COMMUNITY)
Admission: RE | Admit: 2020-01-21 | Discharge: 2020-01-21 | Disposition: A | Discharge status: Home / Self Care | Payer: Medicaid Other | Source: Ambulatory Visit | Attending: Hematology and Oncology | Admitting: Hematology and Oncology

## 2020-01-21 ENCOUNTER — Other Ambulatory Visit: Payer: Self-pay

## 2020-01-21 DIAGNOSIS — D0512 Intraductal carcinoma in situ of left breast: Secondary | ICD-10-CM

## 2020-01-21 DIAGNOSIS — C7A010 Malignant carcinoid tumor of the duodenum: Secondary | ICD-10-CM | POA: Diagnosis present

## 2020-01-21 MED ORDER — TECHNETIUM TC 99M MEDRONATE IV KIT
19.0000 | PACK | Freq: Once | INTRAVENOUS | Status: AC
  Administered 2020-01-21: 12:00:00 19 via INTRAVENOUS

## 2020-01-21 NOTE — Progress Notes (Signed)
HEMATOLOGY-ONCOLOGY MYCHART VIDEO VISIT PROGRESS NOTE  I connected with [name] on 01/22/2020 at  8:45 AM EDT by MyChart video conference and verified that I am speaking with the correct person using two identifiers.  I discussed the limitations, risks, security and privacy concerns of performing an evaluation and management service by MyChart and the availability of in person appointments.  I also discussed with the patient that there may be a patient responsible charge related to this service. The patient expressed understanding and agreed to proceed.  Patient's Location: Home Physician Location: Clinic  CHIEF COMPLIANT: Surveillance of left breast DCIS, review recent scan   INTERVAL HISTORY: [name] is a 64 y.o. female with above-mentioned history of left breast DCIS treated with lumpectomy, radiation, and who declined oral anti-estrogen therapy. She also has a history ofresection ofacarcinoid tumor of the small bowel, for which she is on surveillance. She presentsover MyChart today to review her recent scan.   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 There is no height or weight on file to calculate BMI.  I have reviewed the data as listed CMP Latest Ref Rng & Units  10/21/2019 09/03/2019 03/03/2019  Glucose 65 - 99 mg/dL 89 101(H) 96  BUN 8 - 27 mg/dL 14 14 19   Creatinine 0.57 - 1.00 mg/dL 0.70 0.76 0.71  Sodium 134 - 144 mmol/L 142 140 138  Potassium 3.5 - 5.2 mmol/L 4.3 3.9 3.7  Chloride 96 - 106 mmol/L 105 107 108  CO2 20 - 29 mmol/L 22 22 18(L)  Calcium 8.7 - 10.3 mg/dL 9.4 9.2 9.1  Total Protein 6.0 - 8.5 g/dL 6.1 - 6.4(L)  Total Bilirubin 0.0 - 1.2 mg/dL 0.4 - 0.7  Alkaline Phos 39 - 117 IU/L 77 - 60  AST 0 - 40 IU/L 13 - 20  ALT 0 - 32 IU/L 18 - 20    Lab Results  Component Value Date   WBC 6.7 09/03/2019   HGB 13.6 09/03/2019   HCT 43.2 09/03/2019   MCV 90.2 09/03/2019   PLT 219 09/03/2019   NEUTROABS 4.5 09/03/2019      Assessment Plan:  Ductal carcinoma in situ (DCIS) of left breast Left breast DCIS 3.5 cm ER/PR positive status post lumpectomy and radiation therapy declined tamoxifen therapy, currently on surveillance  Surveillance: 1. Breast exam 5/3/2021benign 2. Mammogram10/23/2019stable lumpectomy changes no evidence of malignancy, breast density category C. Patient will need new mammogram  Left chest wall pain: CT scans done December 2020 were normal. Bone scan 01/21/2020:  No evidence of metastatic disease. I suspect it is related to previous rib fracture. Since the scan is normal, she can return to clinic in 1 year for surveillance checks and follow-up  Malignant carcinoid tumor of duodenum Status post bowel resection July 2014 at the same time that she had lumpectomy.  CT  chest of 09/03/2019: Upper abdomen no abnormalities noted. She does not have any symptoms related to carcinoid syndrome and previous chromogranin A levels have all been normal.  Tobacco abuse: Recommended smoking cessation. She has a history of ST elevation MI.    I discussed the assessment and treatment plan with the patient. The patient was provided an opportunity to ask questions and all were answered. The patient agreed with the plan  and demonstrated an understanding of the instructions. The patient was advised to call back or seek an in-person evaluation if the symptoms worsen or if the condition fails to improve as anticipated.   I provided 30 minutes of face-to-face MyChart video visit time during this encounter.    Rulon Eisenmenger, MD 01/22/2020   I, Molly Dorshimer, am acting as scribe for Nicholas Lose, MD.  I have reviewed the above documentation for accuracy and completeness, and I agree with the above.

## 2020-01-22 ENCOUNTER — Inpatient Hospital Stay (HOSPITAL_BASED_OUTPATIENT_CLINIC_OR_DEPARTMENT_OTHER): Payer: Medicaid Other | Admitting: Hematology and Oncology

## 2020-01-22 DIAGNOSIS — Z923 Personal history of irradiation: Secondary | ICD-10-CM

## 2020-01-22 DIAGNOSIS — D0512 Intraductal carcinoma in situ of left breast: Secondary | ICD-10-CM

## 2020-01-22 DIAGNOSIS — Z86000 Personal history of in-situ neoplasm of breast: Secondary | ICD-10-CM | POA: Diagnosis not present

## 2020-01-22 DIAGNOSIS — Z170 Estrogen receptor positive status [ER+]: Secondary | ICD-10-CM | POA: Diagnosis not present

## 2020-01-22 NOTE — Assessment & Plan Note (Signed)
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 Breast exam 5/3/2021benign 2. Mammogram10/23/2019stable lumpectomy changes no evidence of malignancy, breast density category C. Patient will need new mammogram  Left chest wall pain: CT scans done December 2020 were normal. Bone scan 01/21/2020:  I suspect it is related to previous rib fracture. If the scan is normal, then she can return to clinic in 1 year for surveillance checks and follow-up  Malignant carcinoid tumor of duodenum Status post bowel resection July 2014 at the same time that she had lumpectomy.  CT chest of 09/03/2019: Upper abdomen no abnormalities noted. She does not have any symptoms related to carcinoid syndrome and previous chromogranin A levels have all been normal.  Tobacco abuse: Recommended smoking cessation. She has a history of ST elevation MI.

## 2020-03-29 ENCOUNTER — Other Ambulatory Visit: Payer: Self-pay | Admitting: Physician Assistant

## 2020-03-29 DIAGNOSIS — Z1231 Encounter for screening mammogram for malignant neoplasm of breast: Secondary | ICD-10-CM

## 2020-04-01 ENCOUNTER — Other Ambulatory Visit: Payer: Self-pay

## 2020-04-01 ENCOUNTER — Ambulatory Visit
Admission: RE | Admit: 2020-04-01 | Discharge: 2020-04-01 | Disposition: A | Discharge status: Home / Self Care | Payer: Medicaid Other | Source: Ambulatory Visit | Attending: Physician Assistant | Admitting: Physician Assistant

## 2020-04-01 DIAGNOSIS — Z1231 Encounter for screening mammogram for malignant neoplasm of breast: Secondary | ICD-10-CM

## 2020-04-05 ENCOUNTER — Other Ambulatory Visit: Payer: Self-pay | Admitting: Physician Assistant

## 2020-04-05 DIAGNOSIS — R928 Other abnormal and inconclusive findings on diagnostic imaging of breast: Secondary | ICD-10-CM

## 2020-04-13 ENCOUNTER — Emergency Department (HOSPITAL_COMMUNITY)
Admission: EM | Admit: 2020-04-13 | Discharge: 2020-04-13 | Disposition: A | Discharge status: Home / Self Care | Payer: Medicaid Other | Attending: Emergency Medicine | Admitting: Emergency Medicine

## 2020-04-13 ENCOUNTER — Other Ambulatory Visit: Payer: Self-pay

## 2020-04-13 DIAGNOSIS — Z5321 Procedure and treatment not carried out due to patient leaving prior to being seen by health care provider: Secondary | ICD-10-CM | POA: Insufficient documentation

## 2020-04-13 DIAGNOSIS — R519 Headache, unspecified: Secondary | ICD-10-CM | POA: Diagnosis present

## 2020-04-13 DIAGNOSIS — U071 COVID-19: Secondary | ICD-10-CM | POA: Diagnosis not present

## 2020-04-13 NOTE — ED Notes (Signed)
Pt notified staff that she was leaving 

## 2020-04-13 NOTE — ED Triage Notes (Signed)
Pt [place]. Pt states she was given steroids from pcp yesterday but has not taken any yet. Pt here in ER today due to PCP advised to come to ER for headache and diarrhea. Pt denies any sob or CP.

## 2020-05-01 ENCOUNTER — Other Ambulatory Visit: Payer: Self-pay | Admitting: Cardiovascular Disease

## 2020-05-19 ENCOUNTER — Other Ambulatory Visit: Payer: Self-pay

## 2020-05-19 ENCOUNTER — Other Ambulatory Visit: Payer: Self-pay | Admitting: Physician Assistant

## 2020-05-19 ENCOUNTER — Ambulatory Visit
Admission: RE | Admit: 2020-05-19 | Discharge: 2020-05-19 | Disposition: A | Discharge status: Home / Self Care | Payer: Medicaid Other | Source: Ambulatory Visit | Attending: Physician Assistant | Admitting: Physician Assistant

## 2020-05-19 DIAGNOSIS — R928 Other abnormal and inconclusive findings on diagnostic imaging of breast: Secondary | ICD-10-CM

## 2020-05-19 DIAGNOSIS — R921 Mammographic calcification found on diagnostic imaging of breast: Secondary | ICD-10-CM

## 2020-07-20 ENCOUNTER — Ambulatory Visit: Payer: Medicaid Other | Admitting: Cardiovascular Disease

## 2020-08-13 ENCOUNTER — Encounter (HOSPITAL_COMMUNITY): Payer: Self-pay | Admitting: Emergency Medicine

## 2020-08-13 ENCOUNTER — Other Ambulatory Visit: Payer: Self-pay

## 2020-08-13 ENCOUNTER — Ambulatory Visit (HOSPITAL_COMMUNITY)
Admission: EM | Admit: 2020-08-13 | Discharge: 2020-08-13 | Disposition: A | Discharge status: Home / Self Care | Payer: Medicaid Other | Attending: Family Medicine | Admitting: Family Medicine

## 2020-08-13 DIAGNOSIS — Z20822 Contact with and (suspected) exposure to covid-19: Secondary | ICD-10-CM | POA: Insufficient documentation

## 2020-08-13 LAB — SARS CORONAVIRUS 2 (TAT 6-24 HRS): SARS Coronavirus 2: NEGATIVE

## 2020-08-13 NOTE — Discharge Instructions (Addendum)
Testing you for covid  Check my chart for results.  Follow up as needed for continued or worsening symptoms

## 2020-08-13 NOTE — ED Triage Notes (Signed)
Pt presents with dry cough and nasal congestion xs 2 days. States for 2-3 days has had a sharp pain in rib cage with movement and raising left arm.

## 2020-08-13 NOTE — ED Provider Notes (Signed)
[facility]    CSN: [id number] Arrival date & time: 08/13/20  0813      History   Chief Complaint Chief Complaint  Patient presents with  . Cough    HPI [name] is a 64 y.o. female.   Patient is a 64 year old female with past medical history of anxiety, allergy, arthritis, breast cancer, brain aneurysm, hepatitis B, C, hyperlipidemia, hypertension, stroke.  She presents today with concerns for Covid exposure.  Reporting brother has had Covid type symptoms and had a rapid test at home that was positive.  Currently she is denying any cough, fever, nasal congestion, body aches, chills, sore throat, ear pain or loss of taste or smell.     Past Medical History:  Diagnosis Date  . Adjustment disorder with mixed anxiety and depressed mood 01/05/2013  . Allergy   . Anxiety   . Arthritis    "knees,wrists" (07/31/2014)  . Brain aneurysm 2012   2cms.  . Breast cancer ([place]) 06/05/2013  . Breast disorder    lump in left breast  . Carcinoid tumor   . Complication of anesthesia    hallucinations , anxiety   . Depression   . H/O echocardiogram    not finding report in EPIC, pt. reports that it was before her appt. /w Dr. Johnsie Cancel   . Hepatitis B dx'd ~ 1980  . Hepatitis C dx'd ~ 2000   "specialist says it cleared itself on it's own"  . Hx of radiation therapy 07/17/13-09/03/13   left breast,60.4Gy, 32/33 fx completed  . Hyperlipidemia   . Hypertension    "never took my RX" (07/31/2014)  . Jaundice   . Malignant neoplasm of lower-outer quadrant of female breast ([place]) 05/19/2013  . Pain management    goes to [facility] clinic, but she doesn't intend to return   . Personal history of radiation therapy    2014  . Pure hypercholesterolemia 10/23/2019  . Stroke [facility]) 2012   TIA  . TIA (transient ischemic attack) 06/2011   "can't see real well out of left eye since; weaker on left side since"  . Varicose veins   . Walking pneumonia ~ 2008    Patient Active Problem List    Diagnosis Date Noted  . Pure hypercholesterolemia 10/23/2019  . Takotsubo cardiomyopathy 12/25/2017  . Cerebrovascular accident (CVA) ([place])   . 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 Elevated troponin   . NSTEMI (non-ST elevated myocardial infarction) ([place]) 12/23/2017  . Head lice infestation 71/02/2693  . Chest pain 07/31/2014  . 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 Partial small bowel obstruction ([place]) 04/07/2013  . SBO (small bowel obstruction) ([place]) 01/05/2013  . Adjustment disorder with mixed anxiety and depressed mood 01/05/2013  . Chronic pain syndrome 01/05/2013  . Thyroid cyst 01/05/2013  . Tobacco abuse 12/05/2012  . Hepatitis C   . Jaundice   . Varicose veins   . Brain aneurysm   . TIA (transient ischemic attack) 06/12/2011    Past Surgical History:  Procedure Laterality Date  . BREAST BIOPSY Left 03/2013; 05/2013  . BREAST LUMPECTOMY Left 04/02/2013   Procedure: Excision  of Left Breast Mass;  Surgeon: Harl Bowie, MD;  Location: [facility];  Service: General;  Laterality: Left;  . BREAST LUMPECTOMY WITH SENTINEL LYMPH NODE BIOPSY Left 06/05/2013   Procedure: BREAST LUMPECTOMY WITH SENTINEL LYMPH NODE BX;  Surgeon:  Harl Bowie, MD;  Location: [facility];  Service: General;  Laterality: Left;  . CARDIAC CATHETERIZATION  12/24/2017  . COLON SURGERY  03/2013   bowel obstruction , malignant tumor    . DILATION AND CURETTAGE OF UTERUS  ~ 1990   S/P miscarriage  . EVACUATION BREAST HEMATOMA Left 06/05/2013   Procedure: EVACUATION HEMATOMA BREAST;  Surgeon: Harl Bowie, MD;  Location: [facility];  Service: General;  Laterality: Left;  . LAPAROSCOPY N/A 04/02/2013   Procedure: Diaganostic Lap.; Small Bowel Resection;  Surgeon: Harl Bowie, MD;  Location: [facility];  Service: General;  Laterality: N/A;  . LEFT HEART CATH AND CORONARY ANGIOGRAPHY N/A 12/24/2017    Procedure: LEFT HEART CATH AND CORONARY ANGIOGRAPHY;  Surgeon: Jettie Booze, MD;  Location: [facility];  Service: Cardiovascular;  Laterality: N/A;  . TUMOR EXCISION  04/02/2013   "carcoid tumor removed"  . vaginal births   45    birthed twins at -[redacted] weeks gestation - both survived , total of 3 pregnancies - all born vaginally    OB History    Gravida  5   Para  4   Term      Preterm  2   AB  1   Living  4     SAB      TAB  1   Ectopic      Multiple      Live Births               Home Medications    Prior to Admission medications   Medication Sig Start Date End Date Taking? Authorizing Provider  acetaminophen (TYLENOL) 500 MG tablet Take 500 mg by mouth 2 (two) times daily as needed (for back and knee pain).     [provider]  ALPRAZolam Duanne Moron) 1 MG tablet Take 1 tablet (1 mg total) by mouth 2 (two) times daily as needed for anxiety. 06/04/15   Nicholas Lose, MD  aspirin EC 81 MG tablet Take 81 mg by mouth daily.    [provider]  cephALEXin (KEFLEX) 500 MG capsule Take 1 capsule (500 mg total) by mouth 4 (four) times daily. 12/29/19   Raylene Everts, MD  ezetimibe (ZETIA) 10 MG tablet Take 1 tablet (10 mg total) by mouth daily. 01/15/19 04/15/19  Skeet Latch, MD  HYDROcodone-acetaminophen (NORCO/VICODIN) 5-325 MG tablet Take 1-2 tablets by mouth every 6 (six) hours as needed. 12/29/19   Raylene Everts, MD  lisinopril (ZESTRIL) 10 MG tablet TAKE 1 TABLET BY MOUTH EVERY DAY 05/03/20   Skeet Latch, MD  metoprolol succinate (TOPROL-XL) 25 MG 24 hr tablet Take 0.5 tablets (12.5 mg total) by mouth daily. 10/01/18   Lendon Colonel, NP  nitroGLYCERIN (NITROSTAT) 0.4 MG SL tablet Place 1 tablet (0.4 mg total) under the tongue every 5 (five) minutes x 3 doses as needed for chest pain. 12/25/17   [facility], Tami Lin, PA    Family History Family History  Problem Relation Age of Onset  . Diabetes Paternal Grandfather     . Diabetes Paternal Grandmother   . Diabetes Mother   . Heart disease Mother   . Cancer Mother        cervical  . Diabetes Brother   . Stroke Brother   . Heart disease Brother   . Heart disease Sister   . Diabetes Sister   . Cancer Sister        cervical  . Arrhythmia  Sister   . Cancer Father        lung    Social History Social History   Tobacco Use  . Smoking status: Current Some Day Smoker    Packs/day: 0.50    Years: 36.00    Pack years: 18.00    Types: Cigarettes    Last attempt to quit: 12/22/2017    Years since quitting: 2.6  . Smokeless tobacco: Never Used  . Tobacco comment: smokes some days, use patch some days  Vaping Use  . Vaping Use: Never used  Substance Use Topics  . Alcohol use: Yes    Alcohol/week: 3.0 standard drinks    Types: 3 Glasses of wine per week  . Drug use: Yes    Types: Marijuana    Comment: "last marijuana ~ 06/2014     Allergies   Statins, Tramadol, Ace inhibitors, Prozac [fluoxetine], Wellbutrin [bupropion], Ciprofloxacin hcl, and Sertraline hcl   Review of Systems Review of Systems   Physical Exam Triage Vital Signs ED Triage Vitals  Enc Vitals Group     BP 08/13/20 0840 120/71     Pulse Rate 08/13/20 0840 72     Resp 08/13/20 0840 18     Temp 08/13/20 0840 98.5 F (36.9 C)     Temp Source 08/13/20 0840 Oral     SpO2 08/13/20 0840 97 %     Weight --      Height --      Head Circumference --      Peak Flow --      Pain Score 08/13/20 0838 0     Pain Loc --      Pain Edu? --      Excl. in [place]? --    No data found.  Updated Vital Signs BP 120/71 (BP Location: Right Arm)   Pulse 72   Temp 98.5 F (36.9 C) (Oral)   Resp 18   SpO2 97%   Visual Acuity Right Eye Distance:   Left Eye Distance:   Bilateral Distance:    Right Eye Near:   Left Eye Near:    Bilateral Near:     Physical Exam Vitals and nursing note reviewed.  Constitutional:      General: She is not in acute distress.    Appearance: Normal  appearance. She is not ill-appearing, toxic-appearing or diaphoretic.  HENT:     Head: Normocephalic.     Nose: Nose normal.  Eyes:     Conjunctiva/sclera: Conjunctivae normal.  Pulmonary:     Effort: Pulmonary effort is normal.  Musculoskeletal:        General: Normal range of motion.     Cervical back: Normal range of motion.  Skin:    General: Skin is warm and dry.     Findings: No rash.  Neurological:     Mental Status: She is alert.  Psychiatric:        Mood and Affect: Mood normal.      UC Treatments / Results  Labs (all labs ordered are listed, but only abnormal results are displayed) Labs Reviewed - No data to display  EKG   Radiology No results found.  Procedures Procedures (including critical care time)  Medications Ordered in UC Medications - No data to display  Initial Impression / Assessment and Plan / UC Course  I have reviewed the triage vital signs and the nursing notes.  Pertinent labs & imaging results that were available during my care of the patient were  reviewed by me and considered in my medical decision making (see chart for details).     Covid exposure but asymptomatic Covid test pending Follow up as needed for continued or worsening symptoms  Final Clinical Impressions(s) / UC Diagnoses   Final diagnoses:  Close exposure to COVID-19 virus     Discharge Instructions     Testing you for covid  Check my chart for results.  Follow up as needed for continued or worsening symptoms    ED Prescriptions    None     PDMP not reviewed this encounter.   Orvan July, NP 08/13/20 [phone]

## 2020-09-20 ENCOUNTER — Ambulatory Visit (HOSPITAL_COMMUNITY)
Admission: EM | Admit: 2020-09-20 | Discharge: 2020-09-20 | Disposition: A | Discharge status: Home / Self Care | Payer: Medicaid Other

## 2020-10-29 ENCOUNTER — Other Ambulatory Visit: Payer: Self-pay | Admitting: Cardiovascular Disease

## 2020-11-01 ENCOUNTER — Telehealth: Payer: Self-pay | Admitting: Cardiovascular Disease

## 2020-11-01 NOTE — Telephone Encounter (Signed)
*  STAT* If patient is at the pharmacy, call can be transferred to refill team.   1. Which medications need to be refilled? (please list name of each medication and dose if known) Lisinopril   2. Which pharmacy/location (including street and city if local pharmacy) is medication to be sent to? [facility]  3. Do they need a 30 day or 90 day supply? Not sure

## 2020-11-02 MED ORDER — LISINOPRIL 10 MG PO TABS: 10.0000 mg | ORAL_TABLET | Freq: Every day | ORAL | 3 refills | Status: DC

## 2020-11-11 ENCOUNTER — Ambulatory Visit: Payer: Medicaid Other | Admitting: Cardiology

## 2020-11-25 ENCOUNTER — Other Ambulatory Visit: Payer: Self-pay

## 2020-11-25 ENCOUNTER — Ambulatory Visit
Admission: RE | Admit: 2020-11-25 | Discharge: 2020-11-25 | Disposition: A | Discharge status: Home / Self Care | Payer: Medicaid Other | Source: Ambulatory Visit | Attending: Physician Assistant | Admitting: Physician Assistant

## 2020-11-25 ENCOUNTER — Other Ambulatory Visit: Payer: Self-pay | Admitting: Physician Assistant

## 2020-11-25 DIAGNOSIS — R921 Mammographic calcification found on diagnostic imaging of breast: Secondary | ICD-10-CM

## 2021-02-18 NOTE — Progress Notes (Signed)
Cardiology Office Note   Date:  02/22/2021   ID:  [name], DOB Aug 07, 1956, MRN [id number]  PCP:  Aura Dials, PA-C  Cardiologist:   Skeet Latch, MD   Chief Complaint  Patient presents with   Follow-up   Shortness of Breath   Headache     History of Present Illness: [name] is a 65 y.o. female  with Takatsubo cardiomyopathy, hypertension, hyperlipidemia, prior stroke, ongoing tobacco abuse, malignant carcinoid tumor s/p resection in 2014, and breast cancer status post XRT.    She was admitted 12/2017 with chest pain and was found to have Takotsubo cardiomyopathy.  Echo that admission revealed LVEF 50 to 55% with grade 1 diastolic dysfunction and hypokinesis of the mid to apical inferolateral and inferior myocardium.  She underwent left heart catheterization 12/24/2017 that revealed 10% mid RCA stenosis and LVEF was 25 to 35% by left ventriculography.  She was started on metoprolol and no ACE inhibitor/ARB due to low blood pressure.  She followed up with Kerin Ransom, PA-C and was doing well.  She was started on lisinopril.  She reported cough since starting lisinopril so it was switched to losartan.  However, her cough persisted and the losartan made her feel poorly so she switched back to lisinopril.    [name] was seen in the ED 08/2019 with shortness of breath and atypical chest pain.  She also had pain and swelling in her leg.  Cardiac enzymes were negative.  EKG was unremarkable.  She was found to have a superficial venous thrombosis of a varicose vein.  She was prescribed naproxen and instructed to follow-up with her cardiologist. She had been trying to quit smoking.  Today, she is feeling well overall. For about a week, when lying down at night she felt short-lasting palpitations. She agrees that they may be stress-related. In the past year she has lost 4-5 family members. Occasionally she notices edema in her LE and her abdomen. For activity she mows the lawn, but  feels short of breath and lightheaded after a while. She notes losing over 10 pounds since last week. For her diet, she continues to work on lowering her salt intake. Since 6/7 she has not smoked any cigarettes. For several months now, she has been taking Zetia daily. She denies any chest pain, headaches, or syncope. She also has no orthopnea or PND.   Past Medical History:  Diagnosis Date   Adjustment disorder with mixed anxiety and depressed mood 01/05/2013   Allergy    Anxiety    Arthritis    "knees,wrists" (07/31/2014)   Brain aneurysm 2012   2cms.   Breast cancer ([place]) 06/05/2013   Breast disorder    lump in left breast   Carcinoid tumor    Complication of anesthesia    hallucinations , anxiety    Depression    H/O echocardiogram    not finding report in EPIC, pt. reports that it was before her appt. /w Dr. Johnsie Cancel    Hepatitis B dx'd ~ 1980   Hepatitis C dx'd ~ 2000   "specialist says it cleared itself on it's own"   Hx of radiation therapy 07/17/13-09/03/13   left breast,60.4Gy, 32/33 fx completed   Hyperlipidemia    Hypertension    "never took my RX" (07/31/2014)   Jaundice    Malignant neoplasm of lower-outer quadrant of female breast ([place]) 05/19/2013   Pain management    goes to [facility], but she doesn't intend  to return    Personal history of radiation therapy    2014   Pure hypercholesterolemia 10/23/2019   Stroke ([place]) 2012   TIA   TIA (transient ischemic attack) 06/2011   "can't see real well out of left eye since; weaker on left side since"   Varicose veins    Walking pneumonia ~ 2008    Past Surgical History:  Procedure Laterality Date   BREAST BIOPSY Left 03/2013; 05/2013   BREAST LUMPECTOMY Left 04/02/2013   Procedure: Excision  of Left Breast Mass;  Surgeon: Harl Bowie, MD;  Location: [facility];  Service: General;  Laterality: Left;   BREAST LUMPECTOMY WITH SENTINEL LYMPH NODE BIOPSY Left 06/05/2013   Procedure: BREAST LUMPECTOMY WITH SENTINEL LYMPH  NODE BX;  Surgeon: Harl Bowie, MD;  Location: [facility];  Service: General;  Laterality: Left;   CARDIAC CATHETERIZATION  12/24/2017   COLON SURGERY  03/2013   bowel obstruction , malignant tumor     DILATION AND CURETTAGE OF UTERUS  ~ 1990   S/P miscarriage   EVACUATION BREAST HEMATOMA Left 06/05/2013   Procedure: EVACUATION HEMATOMA BREAST;  Surgeon: Harl Bowie, MD;  Location: [facility];  Service: General;  Laterality: Left;   LAPAROSCOPY N/A 04/02/2013   Procedure: Diaganostic Lap.; Small Bowel Resection;  Surgeon: Harl Bowie, MD;  Location: [facility];  Service: General;  Laterality: N/A;   LEFT HEART CATH AND CORONARY ANGIOGRAPHY N/A 12/24/2017   Procedure: LEFT HEART CATH AND CORONARY ANGIOGRAPHY;  Surgeon: Jettie Booze, MD;  Location: [facility];  Service: Cardiovascular;  Laterality: N/A;   TUMOR EXCISION  04/02/2013   "carcoid tumor removed"   vaginal births   1975    birthed twins at -[redacted] weeks gestation - both survived , total of 3 pregnancies - all born vaginally     Current Outpatient Medications  Medication Sig Dispense Refill   acetaminophen (TYLENOL) 500 MG tablet Take 500 mg by mouth 2 (two) times daily as needed (for back and knee pain).      ALPRAZolam (XANAX) 1 MG tablet Take 1 tablet (1 mg total) by mouth 2 (two) times daily as needed for anxiety. 45 tablet 0   aspirin EC 81 MG tablet Take 81 mg by mouth daily.     lisinopril (ZESTRIL) 10 MG tablet Take 1 tablet (10 mg total) by mouth daily. 90 tablet 3   nitroGLYCERIN (NITROSTAT) 0.4 MG SL tablet Place 1 tablet (0.4 mg total) under the tongue every 5 (five) minutes x 3 doses as needed for chest pain. 25 tablet 1   No current facility-administered medications for this visit.    Allergies:   Statins, Tramadol, Ace inhibitors, Prozac [fluoxetine], Wellbutrin [bupropion], Ciprofloxacin hcl, and Sertraline hcl    Social History:  The patient  reports that she has been smoking cigarettes. She has  a 18.00 pack-year smoking history. She has never used smokeless tobacco. She reports current alcohol use of about 3.0 standard drinks of alcohol per week. She reports current drug use. Drug: Marijuana.   Family History:  The patient's family history includes Arrhythmia in her sister; Cancer in her father, mother, and sister; Diabetes in her brother, mother, paternal grandfather, paternal grandmother, and sister; Heart disease in her brother, mother, and sister; Stroke in her brother.    ROS:   Please see the history of present illness. (+) Palpitations (+) Stress (+) Shortness of breath (+) Lightheadedness (+) Bilateral LE edema, ankles (+) Abdominal edema  All other systems are reviewed and negative.     PHYSICAL EXAM: VS:  BP 124/80 (BP Location: Left Arm, Patient Position: Sitting, Cuff Size: Normal)   Pulse 64   Ht 5\' 6"  (1.676 m)   Wt 174 lb (78.9 kg)   BMI 28.08 kg/m  , BMI Body mass index is 28.08 kg/m. GENERAL:  Well appearing.  HEENT: Pupils equal round and reactive, fundi not visualized, oral mucosa unremarkable NECK:  No jugular venous distention, waveform within normal limits, carotid upstroke brisk and symmetric, no bruits, no thyromegaly LUNGS:  Clear to auscultation bilaterally HEART:  RRR.  PMI not displaced or sustained,S1 and S2 within normal limits, no S3, no S4, no clicks, no rubs, no murmurs ABD:  Flat, positive bowel sounds normal in frequency in pitch, no bruits, no rebound, no guarding, no midline pulsatile mass, no hepatomegaly, no splenomegaly EXT:  2 plus pulses throughout, no edema, no cyanosis no clubbing SKIN:  No rashes no nodules NEURO:  Cranial nerves II through XII grossly intact, motor grossly intact throughout PSYCH:  Cognitively intact, oriented to person place and time   EKG:   02/22/2021: Sinus Rhythm. Rate 64 bpm. 10/23/2019: Sinus rhythm.  Rate 72 bpm.  Recent Labs: No results found for requested labs within last 8760 hours.   [place] LE  Venous Summary:  Right: No evidence of common femoral vein obstruction.  Left: Findings consistent with acute superficial vein thrombosis involving  the left varicosities or other superficial veins. There is no evidence of  deep vein thrombosis in the lower extremity.   Echo 03/12/2018: - Left ventricle: The cavity size was normal. Wall thickness was    normal. Systolic function was normal. The estimated ejection    fraction was in the range of 55% to 60%. Wall motion was normal;    there were no regional wall motion abnormalities. Doppler    parameters are consistent with abnormal left ventricular    relaxation (grade 1 diastolic dysfunction).  - Aortic valve: There was mild regurgitation.   Echo 12/24/17: Study Conclusions - Left ventricle: Longitudinal global LV strain is borderlin   abnormal at -15% with the abnormality primarly in the   inferolateral wall. The cavity size was normal. There was mild   focal basal hypertrophy of the septum. Systolic function was   normal. The estimated ejection fraction was in the range of 50%   to 55%. There is hypokinesis of the mid-apicalinferolateral and   inferior myocardium. There was an increased relative contribution   of atrial contraction to ventricular filling. Doppler parameters   are consistent with abnormal left ventricular relaxation (grade 1   diastolic dysfunction). - Aortic valve: Mildly to moderately calcified annulus. Trileaflet;   normal thickness leaflets. There was trivial regurgitation. - Mitral valve: Calcified annulus. - Pulmonic valve: There was trivial regurgitation.   Impressions: - Compared to prior echo, inferior and inferolateral wall motion   abnormalities are new.     LHC 12/24/17: Mid RCA lesion is 10% stenosed. LV end diastolic pressure is mildly elevated. There is moderate to severe left ventricular systolic dysfunction in the pattern of Takotsubo cardiomyopathy The left ventricular ejection fraction is  25-35% by visual estimate. There is no aortic valve stenosis.   Lipid Panel    Component Value Date/Time   CHOL 272 (H) 10/21/2019 1059   TRIG 140 10/21/2019 1059   HDL 71 10/21/2019 1059   CHOLHDL 3.8 10/21/2019 1059   CHOLHDL 2.6 12/24/2017 0953   VLDL 26  12/24/2017 0953   LDLCALC 176 (H) 10/21/2019 1059      Wt Readings from Last 3 Encounters:  02/22/21 174 lb (78.9 kg)  01/12/20 177 lb 6.4 oz (80.5 kg)  10/23/19 177 lb 6.4 oz (80.5 kg)      ASSESSMENT AND PLAN: Takotsubo cardiomyopathy Systolic function has normalized.  LVEF was 55 to 60% on echo 03/2018.  She is euvolemic today but does report occasional ankle swelling and swelling in her abdomen.  We will give her Lasix 20 mg to be taken as needed for fluid or weight gain.  Continue lisinopril.  Tobacco abuse She was congratulated on her smoking cessation.  She has not smoked since earlier this month.  Continue using nicotine patch.  Pure hypercholesterolemia LDL goal is less than 70 given her prior TIA.  Continue Zetia.  We will check fasting lipids today.  If her LDL remains greater than 70 we will have her see our pharmacist for a PCSK9 inhibitor.    Current medicines are reviewed at length with the patient today.  The patient does not have concerns regarding medicines.  The following changes have been made: Start Zetia  Labs/ tests ordered today include:   No orders of the defined types were placed in this encounter.    Disposition:   FU with Tamyah Cutbirth C. Oval Linsey, MD, [facility] in 6 months.    I,Mathew Stumpf,acting as a Education administrator for Skeet Latch, MD.,have documented all relevant documentation on the behalf of Skeet Latch, MD,as directed by  Skeet Latch, MD while in the presence of Skeet Latch, MD.  I, [place] Oval Linsey, MD have reviewed all documentation for this visit.  The documentation of the exam, diagnosis, procedures, and orders on 02/22/2021 are all accurate and  complete.   Signed, Lynette Topete C. Oval Linsey, MD, [facility]  02/22/2021 9:18 AM    [place]

## 2021-02-22 ENCOUNTER — Ambulatory Visit (INDEPENDENT_AMBULATORY_CARE_PROVIDER_SITE_OTHER): Payer: Medicaid Other | Admitting: Cardiovascular Disease

## 2021-02-22 ENCOUNTER — Encounter: Payer: Self-pay | Admitting: Cardiovascular Disease

## 2021-02-22 ENCOUNTER — Other Ambulatory Visit: Payer: Self-pay | Admitting: Cardiovascular Disease

## 2021-02-22 ENCOUNTER — Other Ambulatory Visit: Payer: Self-pay

## 2021-02-22 DIAGNOSIS — Z720 Tobacco use: Secondary | ICD-10-CM

## 2021-02-22 DIAGNOSIS — I5181 Takotsubo syndrome: Secondary | ICD-10-CM

## 2021-02-22 DIAGNOSIS — E7800 Pure hypercholesterolemia, unspecified: Secondary | ICD-10-CM

## 2021-02-22 MED ORDER — FUROSEMIDE 20 MG PO TABS: 20.0000 mg | ORAL_TABLET | Freq: Every day | ORAL | 3 refills | Status: DC | PRN

## 2021-02-22 NOTE — Assessment & Plan Note (Signed)
LDL goal is less than 70 given her prior TIA.  Continue Zetia.  We will check fasting lipids today.  If her LDL remains greater than 70 we will have her see our pharmacist for a PCSK9 inhibitor.

## 2021-02-22 NOTE — Patient Instructions (Signed)
Medication Instructions:  START FUROSEMIDE 20 MG DAILY AS NEEDED FOR SWELLING/WEIGHT GAIN OF 2-3 POUNDS IN 24 HOURS OR 5 POUNDS IN A WEEK   *If you need a refill on your cardiac medications before your next appointment, please call your pharmacy*  Lab Work: NONE   Testing/Procedures: NONE   Follow-Up: At [facility], you and your health needs are our priority.  As part of our continuing mission to provide you with exceptional heart care, we have created designated Provider Care Teams.  These Care Teams include your primary Cardiologist (physician) and Advanced Practice Providers (APPs -  Physician Assistants and Nurse Practitioners) who all work together to provide you with the care you need, when you need it.  We recommend signing up for the patient portal called "MyChart".  Sign up information is provided on this After Visit Summary.  MyChart is used to connect with patients for Virtual Visits (Telemedicine).  Patients are able to view lab/test results, encounter notes, upcoming appointments, etc.  Non-urgent messages can be sent to your provider as well.   To learn more about what you can do with MyChart, go to [email or web address].    Your next appointment:   6 month(s)  The format for your next appointment:   In Person  Provider:   DR [place]  Other Instructions  IF YOU ARE USING THE FUROSEMIDE MOST DAYS CALL THE OFFICE TO UPDATE, WILL NEED LABS DONE

## 2021-02-22 NOTE — Assessment & Plan Note (Signed)
She was congratulated on her smoking cessation.  She has not smoked since earlier this month.  Continue using nicotine patch.

## 2021-02-22 NOTE — Assessment & Plan Note (Signed)
Systolic function has normalized.  LVEF was 55 to 60% on echo 03/2018.  She is euvolemic today but does report occasional ankle swelling and swelling in her abdomen.  We will give her Lasix 20 mg to be taken as needed for fluid or weight gain.  Continue lisinopril.

## 2021-02-23 LAB — COMPREHENSIVE METABOLIC PANEL
ALT: 17 IU/L (ref 0–32)
AST: 13 IU/L (ref 0–40)
Albumin/Globulin Ratio: 1.8 (ref 1.2–2.2)
Albumin: 4.4 g/dL (ref 3.8–4.8)
Alkaline Phosphatase: 71 IU/L (ref 44–121)
BUN/Creatinine Ratio: 16 (ref 12–28)
BUN: 11 mg/dL (ref 8–27)
Bilirubin Total: 0.4 mg/dL (ref 0.0–1.2)
CO2: 23 mmol/L (ref 20–29)
Calcium: 9.6 mg/dL (ref 8.7–10.3)
Chloride: 105 mmol/L (ref 96–106)
Creatinine, Ser: 0.68 mg/dL (ref 0.57–1.00)
Globulin, Total: 2.4 g/dL (ref 1.5–4.5)
Glucose: 86 mg/dL (ref 65–99)
Potassium: 4.6 mmol/L (ref 3.5–5.2)
Sodium: 141 mmol/L (ref 134–144)
Total Protein: 6.8 g/dL (ref 6.0–8.5)
eGFR: 97 mL/min/{1.73_m2} (ref >59)

## 2021-02-23 LAB — LIPID PANEL
Chol/HDL Ratio: 3.4 ratio (ref 0.0–4.4)
Cholesterol, Total: 227 mg/dL — ABNORMAL HIGH (ref 100–199)
HDL: 66 mg/dL (ref >39)
LDL Chol Calc (NIH): 134 mg/dL — ABNORMAL HIGH (ref 0–99)
Triglycerides: 151 mg/dL — ABNORMAL HIGH (ref 0–149)
VLDL Cholesterol Cal: 27 mg/dL (ref 5–40)

## 2021-02-23 LAB — SPECIMEN STATUS REPORT

## 2021-03-02 ENCOUNTER — Telehealth: Payer: Self-pay | Admitting: *Deleted

## 2021-03-02 DIAGNOSIS — E7800 Pure hypercholesterolemia, unspecified: Secondary | ICD-10-CM

## 2021-03-02 NOTE — Telephone Encounter (Signed)
Advised patient of lab results and scheduled Pharm D appointment

## 2021-03-02 NOTE — Telephone Encounter (Signed)
-----   Message from Skeet Latch, MD sent at 03/01/2021  5:16 PM EDT ----- Normal kidney function, liver function, and electrolytes.  Cholesterol levels remain elevated but are better.  Given her history of stroke her LDL should be less than 70.  Recommend that she see our pharmacist to consider non-statin options.

## 2021-03-30 ENCOUNTER — Ambulatory Visit: Payer: Medicaid Other

## 2021-03-30 NOTE — Progress Notes (Signed)
Patient Care Team: Selinda Orion as PCP - General (Physician Assistant) Skeet Latch, MD as PCP - Cardiology (Cardiology)  DIAGNOSIS:    ICD-10-CM   1. 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 with above-mentioned history of  left breast DCIS treated with lumpectomy, radiation, and who declined oral anti-estrogen therapy. 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 Mammogram on 11/25/20 showed stable likely benign left breast calcifications. She presents to the clinic today for follow-up.  She underwent work-up at [facility] and had a CT of the abdomen and pelvis which revealed a lesion on the liver which was suspicious for hemangioma.  A liver MRI was ordered which was suggesting the differential diagnosis to be between hemangioma versus metastatic cancer.  Tumor markers were obtained which included CA 19-9, AFP, CEA and all of them were normal.  The recommendation from gastroenterology was upper endoscopy and colonoscopy as well as a biopsy of the liver.  At this point she decided  that she did not want to pursue the care at [facility] and wanted to be seen by [place] instead.  ALLERGIES:  is allergic to statins, tramadol, ace inhibitors, prozac [fluoxetine], wellbutrin [bupropion], ciprofloxacin hcl, and sertraline hcl.  MEDICATIONS:  Current Outpatient Medications  Medication Sig Dispense Refill   acetaminophen (TYLENOL) 500 MG tablet Take 500 mg by mouth 2 (two) times daily as needed (for back and knee pain).      ALPRAZolam (XANAX) 1 MG tablet Take 1 tablet (1 mg total) by mouth 2 (two) times daily as needed for anxiety. 45 tablet 0   aspirin EC 81 MG tablet Take 81 mg by mouth daily.     ezetimibe (ZETIA) 10 MG tablet Take 10 mg by mouth daily.     furosemide (LASIX) 20 MG tablet Take 1 tablet (20 mg total) by mouth daily as needed (WEIGHT GAIN/SWELLING). 30 tablet 3   lisinopril (ZESTRIL) 10 MG tablet Take 1 tablet (10 mg total) by mouth daily. 90 tablet 3   nitroGLYCERIN (NITROSTAT) 0.4 MG SL tablet Place 1 tablet (0.4 mg total) under the tongue every 5 (five) minutes x 3 doses as needed for chest pain. 25 tablet 1   No current facility-administered medications for this visit.    PHYSICAL EXAMINATION: ECOG PERFORMANCE STATUS: 1 - Symptomatic but completely ambulatory  Vitals:   03/31/21 1152  BP: 119/62  Pulse: 71  Resp: (!) 22  Temp: 97.7 F (36.5 C)  SpO2: 98%   Filed Weights   03/31/21 1152  Weight: 176 lb 12.8 oz (80.2 kg)  LABORATORY DATA:  I have reviewed the data as listed CMP Latest Ref Rng & Units 02/22/2021 10/21/2019 09/03/2019  Glucose 65 - 99 mg/dL 86 89 101(H)  BUN 8 - 27 mg/dL 11 14 14   Creatinine 0.57 - 1.00 mg/dL 0.68 0.70 0.76  Sodium 134 - 144 mmol/L 141 142 140  Potassium 3.5 - 5.2 mmol/L 4.6 4.3 3.9  Chloride 96 - 106 mmol/L 105 105 107  CO2 20 - 29 mmol/L 23 22 22   Calcium 8.7 - 10.3 mg/dL 9.6 9.4 9.2  Total Protein 6.0 - 8.5 g/dL 6.8 6.1 -  Total Bilirubin 0.0 - 1.2 mg/dL 0.4 0.4 -  Alkaline Phos 44 - 121 IU/L 71 77 -  AST  0 - 40 IU/L 13 13 -  ALT 0 - 32 IU/L 17 18 -    Lab Results  Component Value Date   WBC 6.7 09/03/2019   HGB 13.6 09/03/2019   HCT 43.2 09/03/2019   MCV 90.2 09/03/2019   PLT 219 09/03/2019   NEUTROABS 4.5 09/03/2019    ASSESSMENT & PLAN:  Ductal carcinoma in situ (DCIS) of left breast 04/02/2013: Left breast DCIS 3.5 cm ER/PR positive status post lumpectomy and radiation therapy declined tamoxifen therapy, currently on surveillance   Surveillance: 1. Breast exam 03/31/2021 benign  2. Mammogram scheduled for 04/04/2021   Bone scan 01/21/2020:  No evidence of metastatic disease.   Malignant carcinoid tumor of duodenum Status post bowel resection July 2014 at the same time that she had lumpectomy.  CT chest of 09/03/2019: Upper abdomen no abnormalities noted. She does not have any symptoms related to carcinoid syndrome and previous chromogranin A levels have all been normal.   Tobacco abuse: Recommended smoking cessation. She has a history of ST elevation MI.  CT abdomen and pelvis: Liver lesion MRI of the liver: [facility]: 3.4 cm lesion differential diagnosis liver cancer versus hemangioma.  Plan: I will request Dr. Oletta Lamas for colonoscopy and endoscopy. We will request for radiology to read her liver MRI so that we can determine if this lesion is hemangioma or liver cancer.  If it is suspicious then we will request a biopsy.  Tumor markers are negative.  Return to clinic in 2 weeks to discuss results of these tests.   No orders of the defined types were placed in this encounter.  The patient has a good understanding of the overall plan. she agrees with it. she will call with any problems that may develop before the next visit here.  Total time spent: 30 mins including face to face time and time spent for planning, charting and coordination of care  Rulon Eisenmenger, MD, MPH 03/31/2021  I, Thana Ates, am acting as scribe for Dr. Nicholas Lose.  I have reviewed the above  documentation for accuracy and completeness, and I agree with the above.

## 2021-03-31 ENCOUNTER — Other Ambulatory Visit: Payer: Self-pay

## 2021-03-31 ENCOUNTER — Inpatient Hospital Stay: Payer: Medicaid Other | Attending: Hematology and Oncology | Admitting: Hematology and Oncology

## 2021-03-31 DIAGNOSIS — Z170 Estrogen receptor positive status [ER+]: Secondary | ICD-10-CM | POA: Diagnosis not present

## 2021-03-31 DIAGNOSIS — Z86000 Personal history of in-situ neoplasm of breast: Secondary | ICD-10-CM | POA: Diagnosis present

## 2021-03-31 DIAGNOSIS — D0512 Intraductal carcinoma in situ of left breast: Secondary | ICD-10-CM

## 2021-03-31 DIAGNOSIS — Z86018 Personal history of other benign neoplasm: Secondary | ICD-10-CM

## 2021-03-31 DIAGNOSIS — Z923 Personal history of irradiation: Secondary | ICD-10-CM | POA: Insufficient documentation

## 2021-03-31 DIAGNOSIS — Z85038 Personal history of other malignant neoplasm of large intestine: Secondary | ICD-10-CM | POA: Insufficient documentation

## 2021-03-31 DIAGNOSIS — Z87898 Personal history of other specified conditions: Secondary | ICD-10-CM

## 2021-03-31 NOTE — Assessment & Plan Note (Signed)
04/02/2013: Left breast DCIS 3.5 cm ER/PR positive status post lumpectomy and radiation therapy declined tamoxifen therapy, currently on surveillance  Surveillance: 1. Breast exam 7/21/2022benign 2. Mammogramscheduled for 04/04/2021  Bone scan 01/21/2020: No evidence of metastatic disease.  Malignant carcinoid tumor of duodenum Status post bowel resection July 2014 at the same time that she had lumpectomy.  CT chest of 09/03/2019: Upper abdomen no abnormalities noted. She does not have any symptoms related to carcinoid syndrome and previous chromogranin A levels have all been normal.  Tobacco abuse: Recommended smoking cessation. She has a history of ST elevation MI.  Return to clinic on an as-needed basis

## 2021-03-31 NOTE — Progress Notes (Signed)
MD requested imaging from [facility] to be [facility].   RN contacted [facility] and images were power shared.  RN spoke with IT support @ [facility] and was instructed on how to submit request.  Request submitted.    Request from MD to place referral to Dr. Oletta Lamas for consult for upper endoscopy and colonoscopy.  RN successfully faxed referral to [phone].

## 2021-04-04 ENCOUNTER — Ambulatory Visit
Admission: RE | Admit: 2021-04-04 | Discharge: 2021-04-04 | Disposition: A | Discharge status: Home / Self Care | Payer: Medicaid Other | Source: Ambulatory Visit | Attending: Physician Assistant | Admitting: Physician Assistant

## 2021-04-04 ENCOUNTER — Other Ambulatory Visit: Payer: Self-pay

## 2021-04-04 DIAGNOSIS — R921 Mammographic calcification found on diagnostic imaging of breast: Secondary | ICD-10-CM

## 2021-04-09 ENCOUNTER — Other Ambulatory Visit: Payer: Self-pay

## 2021-04-09 ENCOUNTER — Emergency Department (HOSPITAL_COMMUNITY)
Admission: EM | Admit: 2021-04-09 | Discharge: 2021-04-09 | Discharge status: Against Medical Advice | Payer: Medicaid Other

## 2021-04-13 NOTE — Progress Notes (Signed)
HEMATOLOGY-ONCOLOGY TELEPHONE VISIT PROGRESS NOTE  I connected with [name] on 04/14/2021 at  9:45 AM EDT by telephone and verified that I am speaking with the correct person using two identifiers.  I discussed the limitations, risks, security and privacy concerns of performing an evaluation and management service by telephone and the availability of in person appointments.  I also discussed with the patient that there may be a patient responsible charge related to this service. The patient expressed understanding and agreed to proceed.   History of Present Illness: [name] is a 65 y.o. female with above-mentioned history of left breast DCIS treated with lumpectomy, radiation, and who declined oral anti-estrogen therapy. 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 Mammogram on 04/04/21 showed stable likely benign left breast calcifications. 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 Breast exam 03/31/2021 benign  2. Mammogram scheduled for 04/04/2021   Bone scan 01/21/2020:  No evidence of metastatic disease.   Malignant carcinoid tumor of duodenum Status post bowel resection July 2014 at the same time that she had lumpectomy.  CT chest of 09/03/2019: Upper abdomen no abnormalities noted. She does not have any symptoms related to carcinoid syndrome and previous chromogranin A levels have all been normal.   Tobacco abuse: Recommended smoking cessation. She has a history of ST elevation MI.   CT abdomen and pelvis: Liver lesion MRI of the liver: [facility]: 3.4 cm lesion differential diagnosis liver cancer versus hemangioma. I will request our radiology to perform an ultrasound-guided liver biopsy.   Plan: I we requested Dr. Oletta Lamas for colonoscopy and endoscopy. Tumor markers are negative.   Return to clinic in 2 weeks to discuss results of the biopsy      I discussed the assessment and treatment plan with the patient. The patient was provided an opportunity to ask questions and all were answered. The patient agreed with the plan and demonstrated an understanding of the instructions. The patient was advised to call back or seek an in-person evaluation if the symptoms worsen or if the condition fails to improve as anticipated.   Total time spent: 12 mins including non-face to face time and time spent for planning, charting and coordination of care  Rulon Eisenmenger, MD 04/14/2021    I, Thana Ates, am acting as scribe for Nicholas Lose, MD.  I have reviewed the above documentation for accuracy and completeness, and I agree with the above.

## 2021-04-13 NOTE — Assessment & Plan Note (Signed)
04/02/2013: Left breast DCIS 3.5 cm ER/PR positive status post lumpectomy and radiation therapy declined tamoxifen therapy, currently on surveillance  Surveillance: 1. Breast exam 7/21/2022benign 2. Mammogramscheduled for 04/04/2021  Bone scan 01/21/2020:No evidence of metastatic disease.  Malignant carcinoid tumor of duodenum Status post bowel resection July 2014 at the same time that she had lumpectomy. CT chest of 09/03/2019: Upper abdomen no abnormalities noted. She does not have any symptoms related to carcinoid syndrome and previous chromogranin A levels have all been normal.  Tobacco abuse: Recommended smoking cessation. She has a history of ST elevation MI.  CT abdomen and pelvis: Liver lesion MRI of the liver: [facility]: 3.4 cm lesion differential diagnosis liver cancer versus hemangioma.  Plan: I will request Dr. Oletta Lamas for colonoscopy and endoscopy. We will request for radiology to read her liver MRI so that we can determine if this lesion is hemangioma or liver cancer.  If it is suspicious then we will request a biopsy.  Tumor markers are negative.  Return to clinic in 2 weeks to discuss results of these tests.

## 2021-04-14 ENCOUNTER — Inpatient Hospital Stay: Payer: Medicaid Other | Attending: Hematology and Oncology | Admitting: Hematology and Oncology

## 2021-04-14 DIAGNOSIS — Z170 Estrogen receptor positive status [ER+]: Secondary | ICD-10-CM | POA: Insufficient documentation

## 2021-04-14 DIAGNOSIS — Z923 Personal history of irradiation: Secondary | ICD-10-CM | POA: Insufficient documentation

## 2021-04-14 DIAGNOSIS — K769 Liver disease, unspecified: Secondary | ICD-10-CM

## 2021-04-14 DIAGNOSIS — Z85038 Personal history of other malignant neoplasm of large intestine: Secondary | ICD-10-CM | POA: Insufficient documentation

## 2021-04-14 DIAGNOSIS — D0512 Intraductal carcinoma in situ of left breast: Secondary | ICD-10-CM

## 2021-04-14 DIAGNOSIS — Z86000 Personal history of in-situ neoplasm of breast: Secondary | ICD-10-CM | POA: Insufficient documentation

## 2021-04-14 NOTE — Progress Notes (Signed)
RN confirmed that request was received for liver bx.  Pending radiologist review at this time.

## 2021-04-15 ENCOUNTER — Encounter (HOSPITAL_COMMUNITY): Payer: Self-pay | Admitting: Radiology

## 2021-04-15 NOTE — Progress Notes (Signed)
Patient Name  Margaret, [name]  Female DOB  28-Mar-1956 SSN  [id number] Address  [address] [place] [address] Phone  [phone] (Home) *Preferred*  [phone] (Mobile)     RE: US BIOPSY (LIVER) Received: Today Suttle, Rosanne Ashing, MD  Garth Bigness D Approved for ultrasound guided liver mass biopsy.  Contrast enhanced ultrasound evaluation and biopsy guidance would be beneficial.   Dylan         Previous Messages    ----- Message -----  From: Garth Bigness D  Sent: 04/14/2021   5:04 PM EDT  To: [facility] Procedure Requests  Subject: US BIOPSY (LIVER)                               Procedure:   US BIOPSY (LIVER)   Reason:  Liver lesion, Liver lesion seen at [facility] liver MRI   History:  Outside MRI done at [facility], images in Pac's   Provider:  Nicholas Lose   Provider Contact:  [phone]

## 2021-04-18 ENCOUNTER — Other Ambulatory Visit: Payer: Self-pay | Admitting: Internal Medicine

## 2021-04-19 ENCOUNTER — Other Ambulatory Visit: Payer: Self-pay

## 2021-04-19 ENCOUNTER — Ambulatory Visit (HOSPITAL_COMMUNITY)
Admission: RE | Admit: 2021-04-19 | Discharge: 2021-04-19 | Disposition: A | Discharge status: Home / Self Care | Payer: Medicaid Other | Source: Ambulatory Visit | Attending: Hematology and Oncology | Admitting: Hematology and Oncology

## 2021-04-19 ENCOUNTER — Encounter (HOSPITAL_COMMUNITY): Payer: Self-pay

## 2021-04-19 DIAGNOSIS — K769 Liver disease, unspecified: Secondary | ICD-10-CM | POA: Insufficient documentation

## 2021-04-19 DIAGNOSIS — R160 Hepatomegaly, not elsewhere classified: Secondary | ICD-10-CM | POA: Diagnosis not present

## 2021-04-19 DIAGNOSIS — F17210 Nicotine dependence, cigarettes, uncomplicated: Secondary | ICD-10-CM | POA: Insufficient documentation

## 2021-04-19 DIAGNOSIS — Z7982 Long term (current) use of aspirin: Secondary | ICD-10-CM | POA: Insufficient documentation

## 2021-04-19 LAB — COMPREHENSIVE METABOLIC PANEL
ALT: 17 U/L (ref 0–44)
AST: 21 U/L (ref 15–41)
Albumin: 4.2 g/dL (ref 3.5–5.0)
Alkaline Phosphatase: 59 U/L (ref 38–126)
Anion gap: 9 (ref 5–15)
BUN: 13 mg/dL (ref 8–23)
CO2: 25 mmol/L (ref 22–32)
Calcium: 9.6 mg/dL (ref 8.9–10.3)
Chloride: 105 mmol/L (ref 98–111)
Creatinine, Ser: 0.72 mg/dL (ref 0.44–1.00)
GFR, Estimated: >60 mL/min (ref >60)
Glucose, Bld: 98 mg/dL (ref 70–99)
Potassium: 4.2 mmol/L (ref 3.5–5.1)
Sodium: 139 mmol/L (ref 135–145)
Total Bilirubin: 0.9 mg/dL (ref 0.3–1.2)
Total Protein: 7.5 g/dL (ref 6.5–8.1)

## 2021-04-19 LAB — CBC
HCT: 43.5 % (ref 36.0–46.0)
Hemoglobin: 14.1 g/dL (ref 12.0–15.0)
MCH: 28.8 pg (ref 26.0–34.0)
MCHC: 32.4 g/dL (ref 30.0–36.0)
MCV: 88.8 fL (ref 80.0–100.0)
Platelets: 215 10*3/uL (ref 150–400)
RBC: 4.9 MIL/uL (ref 3.87–5.11)
RDW: 13.2 % (ref 11.5–15.5)
WBC: 6.9 10*3/uL (ref 4.0–10.5)
nRBC: 0 % (ref 0.0–0.2)

## 2021-04-19 LAB — PROTIME-INR
INR: 0.9 (ref 0.8–1.2)
Prothrombin Time: 12 seconds (ref 11.4–15.2)

## 2021-04-19 MED ORDER — LIDOCAINE HCL 1 % IJ SOLN
INTRAMUSCULAR | Status: AC
  Filled 2021-04-19: qty 20

## 2021-04-19 MED ORDER — HYDROCODONE-ACETAMINOPHEN 5-325 MG PO TABS
1.0000 | ORAL_TABLET | ORAL | Status: DC
Start: 2021-04-19 — End: 2021-04-20

## 2021-04-19 MED ORDER — FENTANYL CITRATE (PF) 100 MCG/2ML IJ SOLN
INTRAMUSCULAR | Status: AC
  Filled 2021-04-19: qty 2

## 2021-04-19 MED ORDER — MIDAZOLAM HCL 2 MG/2ML IJ SOLN
INTRAMUSCULAR | Status: AC
  Filled 2021-04-19: qty 2

## 2021-04-19 MED ORDER — GELATIN ABSORBABLE 12-7 MM EX MISC
CUTANEOUS | Status: AC
  Filled 2021-04-19: qty 1

## 2021-04-19 MED ORDER — MIDAZOLAM HCL 2 MG/2ML IJ SOLN
INTRAMUSCULAR | Status: AC | PRN
  Administered 2021-04-19 (×4): 1 mg via INTRAVENOUS

## 2021-04-19 MED ORDER — FENTANYL CITRATE (PF) 100 MCG/2ML IJ SOLN
INTRAMUSCULAR | Status: AC | PRN
  Administered 2021-04-19 (×2): 50 ug via INTRAVENOUS

## 2021-04-19 MED ORDER — HYDROCODONE-ACETAMINOPHEN 5-325 MG PO TABS
ORAL_TABLET | ORAL | Status: AC
  Administered 2021-04-19: 1
  Filled 2021-04-19: qty 1

## 2021-04-19 MED ORDER — SODIUM CHLORIDE 0.9 % IV SOLN: INTRAVENOUS | Status: DC

## 2021-04-19 NOTE — Procedures (Signed)
Interventional Radiology Procedure Note  Procedure: [place] RT LIVER MASS BX  Complications: None  Estimated Blood Loss:  min  Findings: 18 g cores x 2    M. Daryll Brod, MD

## 2021-04-19 NOTE — Consult Note (Signed)
Chief Complaint: Patient was seen in consultation today for image guided liver mass biopsy  Referring Physician(s): [place]  Supervising Physician: Daryll Brod  Patient Status: [name] - Out-pt  History of Present Illness: [name] is a 65 y.o. female smoker with past medical history significant for anxiety/depression, arthritis, cerebral aneurysm, hepatitis B/C, hyperlipidemia, hypertension, TIA, left breast DCIS in 2014 status post lumpectomy with radiation as well as small bowel carcinoid in 2014 with prior resection.  Outside imaging performed last month has revealed enhancing/enlarging right hepatic lobe lesion concerning for malignancy.  She presents today for image guided liver lesion biopsy for further evaluation.  Past Medical History:  Diagnosis Date   Adjustment disorder with mixed anxiety and depressed mood 01/05/2013   Allergy    Anxiety    Arthritis    "knees,wrists" (07/31/2014)   Brain aneurysm 2012   2cms.   Breast cancer ([place]) 06/05/2013   Breast disorder    lump in left breast   Carcinoid tumor    Complication of anesthesia    hallucinations , anxiety    Depression    H/O echocardiogram    not finding report in EPIC, pt. reports that it was before her appt. /w Dr. Johnsie Cancel    Hepatitis B dx'd ~ 1980   Hepatitis C dx'd ~ 2000   "specialist says it cleared itself on it's own"   Hx of radiation therapy 07/17/13-09/03/13   left breast,60.4Gy, 32/33 fx completed   Hyperlipidemia    Hypertension    "never took my RX" (07/31/2014)   Jaundice    Malignant neoplasm of lower-outer quadrant of female breast ([place]) 05/19/2013   Pain management    goes to [facility], but she doesn't intend to return    Personal history of radiation therapy    2014   Pure hypercholesterolemia 10/23/2019   Stroke ([place]) 2012   TIA   TIA (transient ischemic attack) 06/2011   "can't see real well out of left eye since; weaker on left side since"   Varicose veins     Walking pneumonia ~ 2008    Past Surgical History:  Procedure Laterality Date   BREAST BIOPSY Left 03/2013; 05/2013   BREAST LUMPECTOMY Left 04/02/2013   Procedure: Excision  of Left Breast Mass;  Surgeon: Harl Bowie, MD;  Location: [facility];  Service: General;  Laterality: Left;   BREAST LUMPECTOMY WITH SENTINEL LYMPH NODE BIOPSY Left 06/05/2013   Procedure: BREAST LUMPECTOMY WITH SENTINEL LYMPH NODE BX;  Surgeon: Harl Bowie, MD;  Location: [place];  Service: General;  Laterality: Left;   CARDIAC CATHETERIZATION  12/24/2017   COLON SURGERY  03/2013   bowel obstruction , malignant tumor     DILATION AND CURETTAGE OF UTERUS  ~ 1990   S/P miscarriage   EVACUATION BREAST HEMATOMA Left 06/05/2013   Procedure: EVACUATION HEMATOMA BREAST;  Surgeon: Harl Bowie, MD;  Location: [facility];  Service: General;  Laterality: Left;   LAPAROSCOPY N/A 04/02/2013   Procedure: Diaganostic Lap.; Small Bowel Resection;  Surgeon: Harl Bowie, MD;  Location: [facility];  Service: General;  Laterality: N/A;   LEFT HEART CATH AND CORONARY ANGIOGRAPHY N/A 12/24/2017   Procedure: LEFT HEART CATH AND CORONARY ANGIOGRAPHY;  Surgeon: Jettie Booze, MD;  Location: [facility];  Service: Cardiovascular;  Laterality: N/A;   TUMOR EXCISION  04/02/2013   "carcoid tumor removed"   vaginal births   1975    birthed twins at -[redacted] weeks  gestation - both survived , total of 3 pregnancies - all born vaginally    Allergies: Statins, Tramadol, Ace inhibitors, Prozac [fluoxetine], Wellbutrin [bupropion], Ciprofloxacin hcl, and Sertraline hcl  Medications: Prior to Admission medications   Medication Sig Start Date End Date Taking? Authorizing Provider  acetaminophen (TYLENOL) 500 MG tablet Take 500 mg by mouth 2 (two) times daily as needed (for back and knee pain).     [provider]  ALPRAZolam Duanne Moron) 1 MG tablet Take 1 tablet (1 mg total) by mouth 2 (two) times daily as needed for anxiety.  06/04/15   Nicholas Lose, MD  aspirin EC 81 MG tablet Take 81 mg by mouth daily.    [provider]  ezetimibe (ZETIA) 10 MG tablet Take 10 mg by mouth daily.    [provider]  furosemide (LASIX) 20 MG tablet Take 1 tablet (20 mg total) by mouth daily as needed (WEIGHT GAIN/SWELLING). 02/22/21 05/23/21  Skeet Latch, MD  lisinopril (ZESTRIL) 10 MG tablet Take 1 tablet (10 mg total) by mouth daily. 11/02/20   Skeet Latch, MD  nitroGLYCERIN (NITROSTAT) 0.4 MG SL tablet Place 1 tablet (0.4 mg total) under the tongue every 5 (five) minutes x 3 doses as needed for chest pain. 12/25/17   [facility], Tami Lin, PA     Family History  Problem Relation Age of Onset   Diabetes Paternal Grandfather    Diabetes Paternal Grandmother    Diabetes Mother    Heart disease Mother    Cancer Mother        cervical   Diabetes Brother    Stroke Brother    Heart disease Brother    Heart disease Sister    Diabetes Sister    Cancer Sister        cervical   Arrhythmia Sister    Cancer Father        lung    Social History   Socioeconomic History   Marital status: Single    Spouse name: Not on file   Number of children: 3   Years of education: Not on file   Highest education level: Not on file  Occupational History   Occupation: disabled  Tobacco Use   Smoking status: Some Days    Packs/day: 0.50    Years: 36.00    Pack years: 18.00    Types: Cigarettes    Last attempt to quit: 12/22/2017    Years since quitting: 3.3   Smokeless tobacco: Never   Tobacco comments:    smokes some days, use patch some days  Vaping Use   Vaping Use: Never used  Substance and Sexual Activity   Alcohol use: Yes    Alcohol/week: 3.0 standard drinks    Types: 3 Glasses of wine per week   Drug use: Yes    Types: Marijuana    Comment: "last marijuana ~ 06/2014   Sexual activity: Yes    Birth control/protection: None  Other Topics Concern   Not on file  Social History Narrative   Not on  file   Social Determinants of Health   Financial Resource Strain: Not on file  Food Insecurity: Not on file  Transportation Needs: Not on file  Physical Activity: Not on file  Stress: Not on file  Social Connections: Not on file      Review of Systems denies fever, headache, chest pain, nausea, vomiting or bleeding.  She does have smoker's cough, some dyspnea with exertion, as well as abdominal  Vital  Signs: There were no vitals taken for this visit.  Physical Exam awake, alert.  Chest with distant breath sounds bilaterally.  Heart with regular rate and rhythm.  Abdomen soft, positive bowel sounds, some central tenderness to palpation , palpable nodule mid abd region; no LE edema  Imaging: MM DIAG BREAST TOMO BILATERAL  Result Date: 04/04/2021 CLINICAL DATA:  65 year old female presenting for annual bilateral mammogram and 1 year follow-up of probably benign left breast calcifications. History of remote treated left breast cancer EXAM: DIGITAL DIAGNOSTIC BILATERAL MAMMOGRAM WITH TOMOSYNTHESIS AND CAD TECHNIQUE: Bilateral digital diagnostic mammography and breast tomosynthesis was performed. The images were evaluated with computer-aided detection. COMPARISON:  Previous exam(s). ACR Breast Density Category c: The breast tissue is heterogeneously dense, which may obscure small masses. FINDINGS: Punctate calcifications at the lumpectomy bed are stable. No new or suspicious findings in the remainder of either breast. IMPRESSION: 1. Stable, probably benign left breast calcifications. Recommend continued imaging follow-up. 2. No mammographic evidence of malignancy on the right. RECOMMENDATION: Bilateral diagnostic mammogram in 1 year. I have discussed the findings and recommendations with the patient. If applicable, a reminder letter will be sent to the patient regarding the next appointment. BI-RADS CATEGORY  3: Probably benign. Electronically Signed   By: Kristopher Oppenheim M.D.   On: 04/04/2021 12:01    Labs:  CBC: No results for input(s): WBC, HGB, HCT, PLT in the last 8760 hours.  COAGS: No results for input(s): INR, APTT in the last 8760 hours.  BMP: Recent Labs    02/22/21 0000  NA 141  K 4.6  CL 105  CO2 23  GLUCOSE 86  BUN 11  CALCIUM 9.6  CREATININE 0.68    LIVER FUNCTION TESTS: Recent Labs    02/22/21 0000  BILITOT 0.4  AST 13  ALT 17  ALKPHOS 71  PROT 6.8  ALBUMIN 4.4    TUMOR MARKERS: No results for input(s): AFPTM, CEA, CA199, CHROMGRNA in the last 8760 hours.  Assessment and Plan: 65 y.o. female smoker with past medical history significant for anxiety/depression, arthritis, cerebral aneurysm, hepatitis B/C, hyperlipidemia, hypertension, TIA, left breast DCIS in 2014 status post lumpectomy with radiation as well as small bowel carcinoid in 2014 with prior resection.  Outside imaging performed last month has revealed enhancing/enlarging right hepatic lobe lesion concerning for malignancy.  She presents today for image guided liver lesion biopsy for further evaluation.Risks and benefits of procedure was discussed with the patient  including, but not limited to bleeding, infection, damage to adjacent structures or low yield requiring additional tests.  All of the questions were answered and there is agreement to proceed.  Consent signed and in chart.    Thank you for this interesting consult.  I greatly enjoyed meeting [name] and look forward to participating in their care.  A copy of this report was sent to the requesting provider on this date.  Electronically Signed: D. Rowe Robert, PA-C 04/19/2021, 11:30 AM   I spent a total of   25 minutes  in face to face in clinical consultation, greater than 50% of which was counseling/coordinating care for image guided liver mass biopsy

## 2021-04-19 NOTE — Discharge Instructions (Addendum)
Interventional radiology phone numbers [phone] After hours [phone]  May remove bandaid and shower tomorrow, report signs of infection to your MD  Liver Biopsy, Care After These instructions give you information on caring for yourself after your procedure. Your doctor may also give you more specific instructions. Call your doctor if you have any problems or questions after your procedure. What can I expect after the procedure? After the procedure, it is common to have: Pain and soreness where the biopsy was done. Bruising around the area where the biopsy was done. Sleepiness and be tired for a few days. Follow these instructions at home: Medicines Take over-the-counter and prescription medicines only as told by your doctor. If you were prescribed an antibiotic medicine, take it as told by your doctor. Do not stop taking the antibiotic even if you start to feel better. Do not take medicines such as aspirin and ibuprofen. These medicines can thin your blood. Do not take these medicines unless your doctor tells you to take them. If you are taking prescription pain medicine, take actions to prevent or treat constipation. Your doctor may recommend that you: Drink enough fluid to keep your pee (urine) clear or pale yellow. Take over-the-counter or prescription medicines. Eat foods that are high in fiber, such as fresh fruits and vegetables, whole grains, and beans. Limit foods that are high in fat and processed sugars, such as fried and sweet foods. Caring for your cut Follow instructions from your doctor about how to take care of your cuts from surgery (incisions). Make sure you: Wash your hands with soap and water before you change your bandage (dressing). If you cannot use soap and water, use hand sanitizer. Change your bandage as told by your doctor. Check your cuts every day for signs of infection. Check for: Redness, swelling, or more pain. Fluid or blood. Pus or a bad  smell. Warmth. Do not take baths, swim, or use a hot tub until your doctor says it is okay to do so. You may remove your dressing tomorrow and shower. Activity Rest at home for 1-2 days or as told by your doctor. Avoid sitting for a long time without moving. Get up to take short walks every 1-2 hours. Return to your normal activities as told by your doctor. Ask what activities are safe for you. Do not do these things in the first 24 hours: Drive. Use machinery. Take a bath or shower. Do not lift more than 10 pounds (4.5 kg) or play contact sports for the first 2 weeks.   General instructions Do not drink alcohol in the first week after the procedure. Have someone stay with you for at least 24 hours after the procedure. Get your test results. Ask your doctor or the department that is doing the test: When will my results be ready? How will I get my results? What are my treatment options? What other tests do I need? What are my next steps? Keep all follow-up visits as told by your doctor. This is important.   Contact a doctor if: A cut bleeds and leaves more than just a small spot of blood. A cut is red, puffs up (swells), or hurts more than before. Fluid or something else comes from a cut. A cut smells bad. You have a fever or chills. Get help right away if: You have swelling, bloating, or pain in your belly (abdomen). You get dizzy or faint. You have a rash. You feel sick to your stomach (nauseous) or throw up (  vomit). You have trouble breathing, feel short of breath, or feel faint. Your chest hurts. You have problems talking or seeing. You have trouble with your balance or moving your arms or legs. Summary After the procedure, it is common to have pain, soreness, bruising, and tiredness. Your doctor will tell you how to take care of yourself at home. Change your bandage, take your medicines, and limit your activities as told by your doctor. Call your doctor if you have  symptoms of infection. Get help right away if your belly swells, your cut bleeds a lot, or you have trouble talking or breathing. This information is not intended to replace advice given to you by your health care provider. Make sure you discuss any questions you have with your health care provider. Document Revised: 09/06/2017 Document Reviewed: 09/07/2017 Elsevier Patient Education  2021 [place].     Moderate Conscious Sedation, Adult, Care After This sheet gives you information about how to care for yourself after your procedure. Your health care provider may also give you more specific instructions. If you have problems or questions, contact your health care provider. What can I expect after the procedure? After the procedure, it is common to have: Sleepiness for several hours. Impaired judgment for several hours. Difficulty with balance. Vomiting if you eat too soon. Follow these instructions at home: For the time period you were told by your health care provider: Rest. Do not participate in activities where you could fall or become injured. Do not drive or use machinery. Do not drink alcohol. Do not take sleeping pills or medicines that cause drowsiness. Do not make important decisions or sign legal documents. Do not take care of children on your own.     Eating and drinking Follow the diet recommended by your health care provider. Drink enough fluid to keep your urine pale yellow. If you vomit: Drink water, juice, or soup when you can drink without vomiting. Make sure you have little or no nausea before eating solid foods.   General instructions Take over-the-counter and prescription medicines only as told by your health care provider. Have a responsible adult stay with you for the time you are told. It is important to have someone help care for you until you are awake and alert. Do not smoke. Keep all follow-up visits as told by your health care provider. This is  important. Contact a health care provider if: You are still sleepy or having trouble with balance after 24 hours. You feel light-headed. You keep feeling nauseous or you keep vomiting. You develop a rash. You have a fever. You have redness or swelling around the IV site. Get help right away if: You have trouble breathing. You have new-onset confusion at home. Summary After the procedure, it is common to feel sleepy, have impaired judgment, or feel nauseous if you eat too soon. Rest after you get home. Know the things you should not do after the procedure. Follow the diet recommended by your health care provider and drink enough fluid to keep your urine pale yellow. Get help right away if you have trouble breathing or new-onset confusion at home. This information is not intended to replace advice given to you by your health care provider. Make sure you discuss any questions you have with your health care provider. Document Revised: 12/26/2019 Document Reviewed: 07/24/2019 Elsevier Patient Education  2021 [employer].

## 2021-04-21 LAB — SURGICAL PATHOLOGY

## 2021-04-27 NOTE — Progress Notes (Signed)
Patient Care Team: Selinda Orion as PCP - General (Physician Assistant) Skeet Latch, MD as PCP - Cardiology (Cardiology)  DIAGNOSIS:    ICD-10-CM   1. Ductal carcinoma in situ (DCIS) of left breast  D05.12     2. 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 with above-mentioned history of left breast DCIS treated with lumpectomy, radiation, and who declined oral anti-estrogen therapy. 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 She presents to the clinic today for follow-up. She is complaining of lot of pain especially in her left rib cage as well as the mid abdominal area.  She has been taking Tylenol or Motrin but it has not been helping her.  ALLERGIES:  is allergic to statins, tramadol, ace inhibitors, prozac [fluoxetine], wellbutrin [bupropion], ciprofloxacin hcl, and sertraline hcl.  MEDICATIONS:  Current Outpatient Medications  Medication Sig Dispense Refill   acetaminophen (TYLENOL) 500 MG tablet Take 500 mg by mouth 2 (two) times daily as needed (for back and knee pain).       ALPRAZolam (XANAX) 1 MG tablet Take 1 tablet (1 mg total) by mouth 2 (two) times daily as needed for anxiety. 45 tablet 0   aspirin EC 81 MG tablet Take 81 mg by mouth daily.     ezetimibe (ZETIA) 10 MG tablet Take 10 mg by mouth daily.     furosemide (LASIX) 20 MG tablet Take 1 tablet (20 mg total) by mouth daily as needed (WEIGHT GAIN/SWELLING). 30 tablet 3   lisinopril (ZESTRIL) 10 MG tablet Take 1 tablet (10 mg total) by mouth daily. 90 tablet 3   nitroGLYCERIN (NITROSTAT) 0.4 MG SL tablet Place 1 tablet (0.4 mg total) under the tongue every 5 (five) minutes x 3 doses as needed for chest pain. 25 tablet 1   No current facility-administered medications for this visit.    PHYSICAL EXAMINATION: ECOG PERFORMANCE STATUS: 1 - Symptomatic but completely ambulatory  Vitals:   04/28/21 1002  BP: (!) 151/78  Pulse: 78  Resp: 18  Temp: 97.7 F (36.5 C)  SpO2: 99%   Filed Weights   04/28/21 1002  Weight: 175 lb 14.4 oz (79.8 kg)      LABORATORY DATA:  I have reviewed the data as listed CMP Latest Ref Rng & Units 04/19/2021 02/22/2021 10/21/2019  Glucose 70 - 99 mg/dL 98 86 89  BUN 8 - 23 mg/dL '13 11 14  '$ Creatinine 0.44 - 1.00 mg/dL 0.72 0.68 0.70  Sodium 135 - 145 mmol/L 139 141 142  Potassium 3.5 - 5.1 mmol/L 4.2 4.6 4.3  Chloride 98 - 111 mmol/L 105 105 105  CO2 22 - 32 mmol/L '25 23 22  '$ Calcium 8.9 - 10.3 mg/dL 9.6 9.6 9.4  Total Protein 6.5 - 8.1 g/dL 7.5 6.8 6.1  Total Bilirubin 0.3 - 1.2 mg/dL 0.9 0.4 0.4  Alkaline Phos 38 - 126 U/L 59 71 77  AST 15 - 41 U/L '21 13 13  '$ ALT 0 - 44 U/L '17 17 18    '$ Lab Results  Component Value Date   WBC 6.9 04/19/2021   HGB 14.1 04/19/2021   HCT 43.5 04/19/2021   MCV 88.8 04/19/2021   PLT 215 04/19/2021   NEUTROABS 4.5 09/03/2019    ASSESSMENT & PLAN:  Malignant carcinoid tumor of duodenum Status post bowel resection July 2014 at the same time that she had lumpectomy.  CT chest of 09/03/2019: Upper abdomen no abnormalities noted. She  does not have any symptoms related to carcinoid syndrome and previous chromogranin A levels have all been normal.   Tobacco abuse: Recommended smoking cessation. She has a history of ST elevation MI.   CT abdomen and pelvis: Liver lesion MRI of the liver: [facility]: 3.4 cm lesion differential diagnosis liver cancer versus hemangioma. Liver biopsy: 04/19/2021: Well-differentiated neuroendocrine tumor  Recommendation:  Netspot PET scan Referral for surgical resection GI tumor board referral  Left lower quadrant rib cage pain: Unclear etiology the CT scan done in July did not show any findings there.  We will see what the PET scan shows.  Because of her intractable pain, I sent a prescription for MS IR.  She has anxiety to taking Ultram. Usually these carcinoid tumors do not need any further adjuvant therapy unless there is unresectable metastatic disease.  I requested Santiago Glad our GI navigator to assist [place] with getting the PET scan and tumor board presentation as well as getting an appointment with our surgeons to for resection.  I will see her back after the PET scan is complete.   No orders of the defined types were placed in this encounter.  The patient has a good understanding of the overall plan. she agrees with it. she will call with any problems that may develop before the next visit here.  Total time spent: 30 mins including face to face time and time spent for planning, charting and coordination of care  Rulon Eisenmenger, MD, MPH 04/28/2021  I, Thana Ates, am acting as scribe for Dr. Nicholas Lose.  I have reviewed the above documentation for accuracy and completeness, and I agree with the above.

## 2021-04-28 ENCOUNTER — Encounter: Payer: Self-pay | Admitting: Hematology and Oncology

## 2021-04-28 ENCOUNTER — Other Ambulatory Visit: Payer: Self-pay

## 2021-04-28 ENCOUNTER — Inpatient Hospital Stay (HOSPITAL_BASED_OUTPATIENT_CLINIC_OR_DEPARTMENT_OTHER): Payer: Medicaid Other | Admitting: Hematology and Oncology

## 2021-04-28 VITALS — BP 151/78 | HR 78 | Temp 97.7°F | Resp 18 | Ht 66.0 in | Wt 175.9 lb

## 2021-04-28 DIAGNOSIS — Z923 Personal history of irradiation: Secondary | ICD-10-CM | POA: Diagnosis not present

## 2021-04-28 DIAGNOSIS — Z86000 Personal history of in-situ neoplasm of breast: Secondary | ICD-10-CM | POA: Diagnosis present

## 2021-04-28 DIAGNOSIS — C7A010 Malignant carcinoid tumor of the duodenum: Secondary | ICD-10-CM

## 2021-04-28 DIAGNOSIS — D0512 Intraductal carcinoma in situ of left breast: Secondary | ICD-10-CM

## 2021-04-28 DIAGNOSIS — C7B02 Secondary carcinoid tumors of liver: Secondary | ICD-10-CM | POA: Diagnosis not present

## 2021-04-28 DIAGNOSIS — Z85038 Personal history of other malignant neoplasm of large intestine: Secondary | ICD-10-CM | POA: Diagnosis not present

## 2021-04-28 DIAGNOSIS — Z170 Estrogen receptor positive status [ER+]: Secondary | ICD-10-CM | POA: Diagnosis not present

## 2021-04-28 MED ORDER — MORPHINE SULFATE 15 MG PO TABS: 15.0000 mg | ORAL_TABLET | Freq: Four times a day (QID) | ORAL | 0 refills | Status: DC | PRN

## 2021-04-28 NOTE — Assessment & Plan Note (Signed)
Status post bowel resection July 2014 at the same time that she had lumpectomy. CT chest of 09/03/2019: Upper abdomen no abnormalities noted. She does not have any symptoms related to carcinoid syndrome and previous chromogranin A levels have all been normal.  Tobacco abuse: Recommended smoking cessation. She has a history of ST elevation MI.  CT abdomen and pelvis: Liver lesion MRI of the liver: [facility]: 3.4 cm lesion differential diagnosis liver cancer versus hemangioma. Liver biopsy: 04/19/2021: Well-differentiated neuroendocrine tumor  Recommendation:  Referral for surgical resection

## 2021-04-28 NOTE — Progress Notes (Signed)
I spoke with [name] and reviewed her PET scan appt date, time, and instructions.  I told her that our scheduling dept will reach out to her to schedule a f/u with Dr Lindi Adie after the PET scan appt.  She verbalized understanding.

## 2021-05-02 ENCOUNTER — Telehealth: Payer: Self-pay | Admitting: Hematology and Oncology

## 2021-05-02 NOTE — Telephone Encounter (Signed)
Scheduled per 8/18 los. Called and spoke with pt confirmed 9/7 appt

## 2021-05-11 ENCOUNTER — Ambulatory Visit (HOSPITAL_COMMUNITY)
Admission: RE | Admit: 2021-05-11 | Discharge: 2021-05-11 | Disposition: A | Discharge status: Home / Self Care | Payer: Medicaid Other | Source: Ambulatory Visit | Attending: Hematology and Oncology | Admitting: Hematology and Oncology

## 2021-05-11 ENCOUNTER — Other Ambulatory Visit: Payer: Self-pay

## 2021-05-11 DIAGNOSIS — C7A010 Malignant carcinoid tumor of the duodenum: Secondary | ICD-10-CM | POA: Insufficient documentation

## 2021-05-11 DIAGNOSIS — C7B02 Secondary carcinoid tumors of liver: Secondary | ICD-10-CM | POA: Insufficient documentation

## 2021-05-11 MED ORDER — GALLIUM GA 68 DOTATATE IV KIT
4.4100 | PACK | Freq: Once | INTRAVENOUS | Status: AC | PRN
  Administered 2021-05-11: 4.41 via INTRAVENOUS

## 2021-05-17 NOTE — Progress Notes (Signed)
Patient Care Team: Selinda Orion as PCP - General (Physician Assistant) Skeet Latch, MD as PCP - Cardiology (Cardiology)  DIAGNOSIS:    ICD-10-CM   1. 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 with above-mentioned history of left breast DCIS treated with lumpectomy, radiation, and who declined oral anti-estrogen therapy. 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 She presents to the clinic today for follow-up.  She does not have any symptoms of carcinoid tumor.  ALLERGIES:  is allergic to statins, tramadol, ace inhibitors, prozac [fluoxetine], wellbutrin [bupropion], ciprofloxacin hcl, and sertraline hcl.  MEDICATIONS:  Current Outpatient Medications  Medication Sig Dispense Refill   ALPRAZolam (XANAX) 1 MG tablet Take 1 tablet (1 mg total) by mouth 2 (two) times daily as needed for anxiety. 45 tablet 0   aspirin EC 81 MG tablet Take 81 mg by mouth daily.     lisinopril (ZESTRIL) 10 MG tablet Take 1 tablet (10 mg total) by mouth daily. 90 tablet 3   morphine (MSIR) 15 MG tablet Take 1  tablet (15 mg total) by mouth every 6 (six) hours as needed for severe pain. 30 tablet 0   No current facility-administered medications for this visit.    PHYSICAL EXAMINATION: ECOG PERFORMANCE STATUS: 1 - Symptomatic but completely ambulatory  Vitals:   05/18/21 0908  BP: 134/79  Pulse: 70  Resp: 18  Temp: (!) 97.5 F (36.4 C)  SpO2: 99%   Filed Weights   05/18/21 0908  Weight: 174 lb 6.4 oz (79.1 kg)      LABORATORY DATA:  I have reviewed the data as listed CMP Latest Ref Rng & Units 04/19/2021 02/22/2021 10/21/2019  Glucose 70 - 99 mg/dL 98 86 89  BUN 8 - 23 mg/dL 13 11 14   Creatinine 0.44 - 1.00 mg/dL 0.72 0.68 0.70  Sodium 135 - 145 mmol/L 139 141 142  Potassium 3.5 - 5.1 mmol/L 4.2 4.6 4.3  Chloride 98 - 111 mmol/L 105 105 105  CO2 22 - 32 mmol/L 25 23 22   Calcium 8.9 - 10.3 mg/dL 9.6 9.6 9.4  Total Protein 6.5 - 8.1 g/dL 7.5 6.8 6.1  Total Bilirubin 0.3 - 1.2 mg/dL 0.9 0.4 0.4  Alkaline Phos 38 - 126 U/L 59 71 77  AST 15 - 41 U/L 21 13 13   ALT 0 - 44 U/L 17 17 18     Lab Results  Component Value Date   WBC 6.9 04/19/2021   HGB 14.1 04/19/2021   HCT 43.5 04/19/2021   MCV 88.8 04/19/2021   PLT 215 04/19/2021   NEUTROABS 4.5 09/03/2019    ASSESSMENT &  PLAN:  Malignant carcinoid tumor of duodenum Status post bowel resection July 2014 at the same time that she had lumpectomy.  CT chest of 09/03/2019: Upper abdomen no abnormalities noted. She does not have any symptoms related to carcinoid syndrome and previous chromogranin A levels have all been normal.   Tobacco abuse: Recommended smoking cessation. She has a history of ST elevation MI.   CT abdomen and pelvis: Liver lesion MRI of the liver: [facility]: 3.4 cm lesion differential diagnosis liver cancer versus hemangioma. Liver biopsy: 04/19/2021: Well-differentiated neuroendocrine tumor   Recommendation:  Netspot PET scan: 05/11/2021: Intense uptake right hepatic lobe solitary met, single central mesenteric and  nodular metastases, physiologic activity in the small bowel Referral for surgical resection GI tumor board recommended referral to surgery for liver resection.   Left lower quadrant rib cage pain: Could be related to prior abdominal surgeries.  She will discuss this with her surgeon. Return to clinic after surgery to discuss the final pathology report.    No orders of the defined types were placed in this encounter.  The patient has a good understanding of the overall plan. she agrees with it. she will call with any problems that may develop before the next visit here.  Total time spent: 20 mins including face to face time and time spent for planning, charting and coordination of care  Rulon Eisenmenger, MD, MPH 05/18/2021  I, Thana Ates, am acting as scribe for Dr. Nicholas Lose.  I have reviewed the above documentation for accuracy and completeness, and I agree with the above.

## 2021-05-18 ENCOUNTER — Other Ambulatory Visit: Payer: Self-pay

## 2021-05-18 ENCOUNTER — Inpatient Hospital Stay: Payer: Medicaid Other | Attending: Hematology and Oncology | Admitting: Hematology and Oncology

## 2021-05-18 DIAGNOSIS — Z79899 Other long term (current) drug therapy: Secondary | ICD-10-CM | POA: Diagnosis not present

## 2021-05-18 DIAGNOSIS — Z7982 Long term (current) use of aspirin: Secondary | ICD-10-CM | POA: Diagnosis not present

## 2021-05-18 DIAGNOSIS — C7A010 Malignant carcinoid tumor of the duodenum: Secondary | ICD-10-CM | POA: Insufficient documentation

## 2021-05-18 DIAGNOSIS — I252 Old myocardial infarction: Secondary | ICD-10-CM | POA: Diagnosis not present

## 2021-05-18 DIAGNOSIS — C7B8 Other secondary neuroendocrine tumors: Secondary | ICD-10-CM

## 2021-05-18 DIAGNOSIS — Z86000 Personal history of in-situ neoplasm of breast: Secondary | ICD-10-CM | POA: Insufficient documentation

## 2021-05-18 NOTE — Assessment & Plan Note (Signed)
Status post bowel resection July 2014 at the same time that she had lumpectomy. CT chest of 09/03/2019: Upper abdomen no abnormalities noted. She does not have any symptoms related to carcinoid syndrome and previous chromogranin A levels have all been normal.  Tobacco abuse: Recommended smoking cessation. She has a history of ST elevation MI.  CT abdomen and pelvis: Liver lesion MRI of the liver: [facility]: 3.4 cm lesion differential diagnosis liver cancer versus hemangioma. Liver biopsy: 04/19/2021: Well-differentiated neuroendocrine tumor  Recommendation:  Netspot PET scan: 05/11/2021: Intense uptake right hepatic lobe solitary met, single central mesenteric and nodular metastases, physiologic activity in the small bowel Referral for surgical resection GI tumor board referral  Left lower quadrant rib cage pain:  Will refer to GI oncology

## 2021-05-18 NOTE — Progress Notes (Signed)
Referral, ov note, PET scan report, and pathology report faxed to [facility] requesting referral with Dr Barry Dienes or Dr Zenia Resides.

## 2021-05-18 NOTE — Progress Notes (Signed)
The proposed treatment discussed in conference is for discussion purpose only and is not a binding recommendation.  The patients have not been physically examined, or presented with their treatment options.  Therefore, final treatment plans cannot be decided.  

## 2021-05-31 ENCOUNTER — Other Ambulatory Visit: Payer: Self-pay | Admitting: Surgery

## 2021-05-31 DIAGNOSIS — C7B8 Other secondary neuroendocrine tumors: Secondary | ICD-10-CM

## 2021-06-02 ENCOUNTER — Telehealth: Payer: Self-pay | Admitting: *Deleted

## 2021-06-02 NOTE — Telephone Encounter (Signed)
Dr Lindi Adie suggested taking Tylenol and Advil. Has taken some Advil, but will try alternating and will call us tomorrow.

## 2021-06-02 NOTE — Telephone Encounter (Signed)
Pt states she has been having "right side pain under ribs radiating to my back for 3-4 days. It wakes me up at night". Does not want to take Morphine, states it is too strong and makes her sick to her stomach.

## 2021-06-03 ENCOUNTER — Other Ambulatory Visit: Payer: Self-pay | Admitting: Hematology and Oncology

## 2021-06-03 ENCOUNTER — Telehealth: Payer: Self-pay

## 2021-06-03 MED ORDER — OXYCODONE-ACETAMINOPHEN 5-325 MG PO TABS: 1.0000 | ORAL_TABLET | Freq: Three times a day (TID) | ORAL | 0 refills | Status: DC | PRN

## 2021-06-03 NOTE — Telephone Encounter (Signed)
Pt called stating she alternated tylenol/advil per Dr Lindi Adie and she is still experiencing "pain like I have never felt before". Pt asks for pain medication until she can see MD 10/3. Per MD he will send in Rx for Percocet. Pt verbalized thanks and understanding.

## 2021-06-03 NOTE — Progress Notes (Signed)
Complaining of severe pain in the chest and rib cage: I sent a prescription for Percocets.  We will reassess her when I meet her in a few days.

## 2021-06-11 NOTE — Progress Notes (Signed)
Patient Care Team: Selinda Orion as PCP - General (Physician Assistant) Skeet Latch, MD as PCP - Cardiology (Cardiology)  DIAGNOSIS:    ICD-10-CM   1. 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 with above-mentioned history of left breast DCIS treated with lumpectomy, radiation, and who declined oral anti-estrogen therapy. 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 She presents to the clinic today for follow-up.  Right upper quadrant pain is much improved after Percocets.  She takes half a tablet every so often.  ALLERGIES:  is allergic to statins, tramadol, ace inhibitors, prozac [fluoxetine], wellbutrin [bupropion], ciprofloxacin hcl, and sertraline hcl.  MEDICATIONS:  Current Outpatient Medications  Medication Sig Dispense Refill   ALPRAZolam (XANAX) 1 MG tablet Take 1 tablet (1 mg total) by mouth 2 (two) times daily as needed for anxiety. 45 tablet 0   aspirin EC 81 MG tablet Take 81 mg by mouth daily.     lisinopril (ZESTRIL) 10 MG tablet Take 1 tablet (10 mg total) by mouth daily. 90  tablet 3   morphine (MSIR) 15 MG tablet Take 1 tablet (15 mg total) by mouth every 6 (six) hours as needed for severe pain. 30 tablet 0   oxyCODONE-acetaminophen (PERCOCET/ROXICET) 5-325 MG tablet Take 1 tablet by mouth every 8 (eight) hours as needed for severe pain. 30 tablet 0   No current facility-administered medications for this visit.    PHYSICAL EXAMINATION: ECOG PERFORMANCE STATUS: 1 - Symptomatic but completely ambulatory  Vitals:   06/13/21 1111  BP: (!) 169/89  Pulse: 69  Resp: 18  Temp: 97.7 F (36.5 C)  SpO2: 99%   Filed Weights   06/13/21 1111  Weight: 174 lb 11.2 oz (79.2 kg)     LABORATORY DATA:  I have reviewed the data as listed CMP Latest Ref Rng & Units 04/19/2021 02/22/2021 10/21/2019  Glucose 70 - 99 mg/dL 98 86 89  BUN 8 - 23 mg/dL _0 Creatinine 0.44 - 1.00 mg/dL 0.72 0.68 0.70  Sodium 135 - 145 mmol/L 139 141 142  Potassium 3.5 - 5.1 mmol/L 4.2 4.6 4.3  Chloride 98 - 111 mmol/L 105 105 105  CO2 22 - 32 mmol/L _1 Calcium 8.9 - 10.3 mg/dL 9.6 9.6 9.4  Total Protein 6.5 - 8.1 g/dL 7.5 6.8 6.1  Total Bilirubin 0.3 - 1.2 mg/dL 0.9 0.4 0.4  Alkaline Phos 38 - 126 U/L 59 71 77  AST 15 - 41 U/L _2 ALT 0 - 44 U/L _3 Lab Results  Component Value Date  WBC 6.9 04/19/2021   HGB 14.1 04/19/2021   HCT 43.5 04/19/2021   MCV 88.8 04/19/2021   PLT 215 04/19/2021   NEUTROABS 4.5 09/03/2019    ASSESSMENT & PLAN:  Ductal carcinoma in situ (DCIS) of left breast Status post bowel resection July 2014 at the same time that she had lumpectomy.  CT chest of 09/03/2019: Upper abdomen no abnormalities noted. She does not have any symptoms related to carcinoid syndrome and previous chromogranin A levels have all been normal.   Tobacco abuse: Recommended smoking cessation. She has a history of ST elevation MI.   CT abdomen and pelvis: Liver lesion MRI of the liver: [facility]: 3.4 cm lesion differential diagnosis liver cancer versus  hemangioma. Liver biopsy: 04/19/2021: Well-differentiated neuroendocrine tumor   Recommendation:  Netspot PET scan: 05/11/2021: Intense uptake right hepatic lobe solitary met, single central mesenteric and nodular metastases, physiologic activity in the small bowel GI tumor board recommended referral to surgery for liver resection. Patient saw Dr. Zenia Resides who did not recommend immediate surgery and wanted to watch and monitor.   Left lower quadrant rib cage pain: Could be related to prior abdominal surgeries. Percocets given  I will refer the patient to see Dr. Benay Spice for carcinoid management.    No orders of the defined types were placed in this encounter.  The patient has a good understanding of the overall plan. she agrees with it. she will call with any problems that may develop before the next visit here.  Total time spent: 20 mins including face to face time and time spent for planning, charting and coordination of care  Rulon Eisenmenger, MD, MPH 06/13/2021  I, Thana Ates, am acting as scribe for Dr. Nicholas Lose.  I have reviewed the above documentation for accuracy and completeness, and I agree with the above.

## 2021-06-13 ENCOUNTER — Other Ambulatory Visit: Payer: Self-pay

## 2021-06-13 ENCOUNTER — Inpatient Hospital Stay: Payer: Medicare Other | Attending: Hematology and Oncology | Admitting: Hematology and Oncology

## 2021-06-13 DIAGNOSIS — I252 Old myocardial infarction: Secondary | ICD-10-CM | POA: Diagnosis not present

## 2021-06-13 DIAGNOSIS — C787 Secondary malignant neoplasm of liver and intrahepatic bile duct: Secondary | ICD-10-CM | POA: Insufficient documentation

## 2021-06-13 DIAGNOSIS — I10 Essential (primary) hypertension: Secondary | ICD-10-CM | POA: Diagnosis not present

## 2021-06-13 DIAGNOSIS — I2510 Atherosclerotic heart disease of native coronary artery without angina pectoris: Secondary | ICD-10-CM | POA: Insufficient documentation

## 2021-06-13 DIAGNOSIS — I219 Acute myocardial infarction, unspecified: Secondary | ICD-10-CM | POA: Diagnosis not present

## 2021-06-13 DIAGNOSIS — Z8673 Personal history of transient ischemic attack (TIA), and cerebral infarction without residual deficits: Secondary | ICD-10-CM | POA: Diagnosis not present

## 2021-06-13 DIAGNOSIS — Z79899 Other long term (current) drug therapy: Secondary | ICD-10-CM | POA: Insufficient documentation

## 2021-06-13 DIAGNOSIS — C7A010 Malignant carcinoid tumor of the duodenum: Secondary | ICD-10-CM | POA: Diagnosis present

## 2021-06-13 DIAGNOSIS — Z86000 Personal history of in-situ neoplasm of breast: Secondary | ICD-10-CM | POA: Diagnosis present

## 2021-06-13 DIAGNOSIS — D0512 Intraductal carcinoma in situ of left breast: Secondary | ICD-10-CM | POA: Diagnosis not present

## 2021-06-13 NOTE — Assessment & Plan Note (Signed)
Status post bowel resection July 2014 at the same time that she had lumpectomy. CT chest of 09/03/2019: Upper abdomen no abnormalities noted. She does not have any symptoms related to carcinoid syndrome and previous chromogranin A levels have all been normal.  Tobacco abuse: Recommended smoking cessation. She has a history of ST elevation MI.  CT abdomen and pelvis: Liver lesion MRI of the liver: [facility]: 3.4 cm lesion differential diagnosis liver cancer versus hemangioma. Liver biopsy: 04/19/2021: Well-differentiated neuroendocrine tumor  Recommendation: Netspot PET scan: 05/11/2021: Intense uptake right hepatic lobe solitary met, single central mesenteric and nodular metastases, physiologic activity in the small bowel Referral for surgical resection GI tumor board recommended referral to surgery for liver resection.  Left lower quadrant rib cage pain: Could be related to prior abdominal surgeries. Percocets given

## 2021-06-14 ENCOUNTER — Encounter: Payer: Self-pay | Admitting: *Deleted

## 2021-06-14 NOTE — Progress Notes (Signed)
PATIENT NAVIGATOR PROGRESS NOTE  Name: [name] Date: 06/14/2021 MRN: [id number]  DOB: Apr 07, 1956   Reason for visit:  Introductory call  Comments:  Spoke with patient to schedule New Patient appt with Dr Benay Spice and team, introduced myself as Oncology Navigator.Reviewed what to expect with new patient appt, directions to building and parking, and expect appt to last approx 1 hour. Gave her my contact information as well.     Time spent counseling/coordinating care: 15-30 minutes

## 2021-06-20 ENCOUNTER — Telehealth: Payer: Self-pay | Admitting: *Deleted

## 2021-06-21 ENCOUNTER — Encounter: Payer: Self-pay | Admitting: *Deleted

## 2021-06-21 ENCOUNTER — Inpatient Hospital Stay: Payer: Medicare Other | Admitting: Oncology

## 2021-06-21 ENCOUNTER — Other Ambulatory Visit: Payer: Medicaid Other

## 2021-06-21 ENCOUNTER — Inpatient Hospital Stay (HOSPITAL_BASED_OUTPATIENT_CLINIC_OR_DEPARTMENT_OTHER): Payer: Medicare Other | Admitting: Oncology

## 2021-06-21 ENCOUNTER — Other Ambulatory Visit: Payer: Self-pay

## 2021-06-21 ENCOUNTER — Inpatient Hospital Stay: Payer: Medicare Other

## 2021-06-21 VITALS — BP 159/93 | HR 77 | Temp 98.2°F | Resp 18 | Ht 66.0 in | Wt 176.6 lb

## 2021-06-21 DIAGNOSIS — C7A010 Malignant carcinoid tumor of the duodenum: Secondary | ICD-10-CM

## 2021-06-21 NOTE — Progress Notes (Signed)
PATIENT NAVIGATOR PROGRESS NOTE  Name: BREELLA VANOSTRAND Date: 06/21/2021 MRN: [id number]  DOB: [birth year]   Reason for visit:  Initial visit with Dr Benay Spice  Comments:  Met with Ms Asher during visit with Dr Benay Spice, plan forward is lab work today and will F/U with results next week, patient to call is she develops diarrhea, hot flashes or any other symptoms. She will have repeat CT scan in 6 months then subsequent visit with Dr Benay Spice after CT.     Time spent counseling/coordinating care: 30-45 minutes

## 2021-06-21 NOTE — Progress Notes (Signed)
[facility] OFFICE PROGRESS NOTE   Diagnosis: Carcinoid tumor  INTERVAL HISTORY:   [name] has been followed by Dr. 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  [name] underwent surgery for management of a small bowel obstruction on 04/02/2013.  A mass was noted in the lumen of the distal small bowel.  The allergy revealed an invasive well differentiated neuroendocrine tumor.  Tumor invaded through the muscularis propria into subserosal tissues.  The resection margins are negative.  2 of 5 lymph nodes were positive for metastatic well differentiated neuroendocrine tumor.  A recent aging CT on 05/23/2013 revealed no evidence of metastatic disease.  An MRI of the abdomen on 10/30/2013 revealed a well-circumscribed lesion in segment 8 of the liver measuring 12 mm.  The lesion was felt to likely represent a metastasis from the carcinoid tumor.  The lesion was not hypermetabolic on a PET scan 10/23/9415 and was felt to be indeterminate.  An octreotide scan on 01/16/2014 revealed no evidence of neuroendocrine tumor in the liver or elsewhere.  [name] is under multiple imaging studies over the last several years including a CT 06/25/2015 which revealed a 1.8 x 2 cm right liver mass, stable.  [name] was recently evaluated at [place] for "swelling "and discomfort in the upper abdomen.  A CT of the abdomen/pelvis and MRI of the abdomen in July revealed enlargement of the right liver lesion with changes concerning for malignancy.  [name] was referred to Dr. Lindi Adie and underwent an ultrasound-guided biopsy of the liver lesion on 04/19/2021.  The pathology revealed a well differentiated neuroendocrine tumor with a KIA-6 7 of less than 3% consistent with a grade 1 tumor.  [name] was referred for a Netspot on 05/11/2021.  Intense activity is associated with the right hepatic lobe lesion.  There is a central mesenteric nodule metastasis measuring 11 mm.  [name] is  referred for management of the metastatic carcinoid tumor.  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  Small bowel resection 2014 2.  Left breast lumpectomy 2014 3.  "Skin cancer "removed from the face  Family history: A sister had cervical cancer.  A sister had melanoma.  Her father died of lung cancer.  Social history: [name] lives alone in [place].  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  [name] smokes cigarettes.  [name] reports rare alcohol use.  No transfusion history.  No risk factor for HIV.  Review of systems: Positives-occasional nausea, "floaters "in both eyes, firm lesion at the upper abdominal wall, occasional left subcostal pain  Objective:  Vital signs in last 24 hours:  Blood pressure (!) 159/93, pulse 77, temperature 98.2 F (36.8 C), resp. rate 18, height 5\' 6"  (1.676 m), weight 176 lb 9.6 oz (80.1 kg), SpO2 100 %.    Lymphatics: No cervical, supraclavicular, axillary, or inguinal nodes Resp: Distant breath sounds, no respiratory distress Cardio: Regular rate and rhythm, no murmur GI: No hepatosplenomegaly, no mass, nontender Vascular: No leg edema Neuro: The motor exam appears intact in the upper and lower extremities bilaterally Skin: No rash, no palpable nodule at the right upper abdomen  Portacath/PICC-without erythema  Lab Results:  Lab Results  Component Value Date   WBC 6.9 04/19/2021   HGB 14.1 04/19/2021   HCT 43.5 04/19/2021  MCV 88.8 04/19/2021   PLT 215 04/19/2021   NEUTROABS 4.5 09/03/2019    CMP  Lab Results  Component Value Date   NA 139 04/19/2021   K 4.2 04/19/2021   CL 105 04/19/2021   CO2 25 04/19/2021   GLUCOSE 98 04/19/2021   BUN 13 04/19/2021   CREATININE 0.72 04/19/2021   CALCIUM 9.6 04/19/2021   PROT 7.5 04/19/2021    ALBUMIN 4.2 04/19/2021   AST 21 04/19/2021   ALT 17 04/19/2021   ALKPHOS 59 04/19/2021   BILITOT 0.9 04/19/2021   GFRNONAA >60 04/19/2021   GFRAA 107 10/21/2019    Imaging: Netspot 05/11/2021 and multiple previous CT/MRI images reviewed with [name]   Medications: I have reviewed the patient's current medications.   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, 2/5 lymph nodes, tumor invaded through the muscularis into subserosal tissues,pT3pN1 Isolated right liver lesion noted on multiple imaging studies dating back to 2016 CT abdomen/pelvis 03/11/2021-[facility], increased size of right hepatic lesion measuring up to 3.4 cm MRI abdomen 03/25/2021-enhancing right hepatic lesion concerning for malignancy, 3.2 x 3.7 cm Ultrasound-guided biopsy of right liver lesion a 922-well differentiated neuroendocrine tumor Netspot 05/11/2021-intense activity associated with the right hepatic lobe metastasis, single 11 mm mesenteric nodule with intense radiotracer activity 2.  Left breast DCIS 2014 3.  Hypertension 4.  Tobacco use 5.  CAD/MI 6.  History of a CVA 7.  History of hepatitis C   Disposition: [name] is referred for management of a metastatic carcinoid tumor.  [name] was diagnosed with a small bowel carcinoid tumor when [name] presented with a bowel obstruction in 2014.  There has been a known right liver lesion for several years, confirmed to enlarge on recent imaging studies.  A biopsy confirmed metastatic carcinoid tumor.  [name] appears to have an indolent tumor with a low tumor burden.  We discussed treatment options.  [name] does not have symptoms of carcinoid syndrome.  [name] appears asymptomatic.  I suspect the intermittent left subcostal discomfort is unrelated to the carcinoid tumor.  I explained the delay in disease progression associated with somatostatin analog therapy in this setting.   However there is not a confirmed survival benefit with these agents.  I recommend observation.  We will consider initiating treatment with a somatostatin analog if [name] develops significant disease progression.  [name] will return to the lab for chromogranin a level today.  [name] will return for an office visit in 6 months.  [name] will contact us in the interim for new symptoms.  Betsy Coder, MD  06/21/2021  3:05 PM

## 2021-06-22 ENCOUNTER — Telehealth: Payer: Self-pay | Admitting: Oncology

## 2021-06-22 LAB — CHROMOGRANIN A: Chromogranin A (ng/mL): 134.3 ng/mL — ABNORMAL HIGH (ref 0.0–101.8)

## 2021-06-22 NOTE — Telephone Encounter (Signed)
Scheduled appt per 10/11 los- mailed letter with appt date and time   

## 2021-06-29 ENCOUNTER — Other Ambulatory Visit: Payer: Self-pay

## 2021-06-29 NOTE — Progress Notes (Signed)
The proposed treatment discussed in conference is for discussion purpose only and is not a binding recommendation.  The patients have not been physically examined, or presented with their treatment options.  Therefore, final treatment plans cannot be decided.  

## 2021-06-30 ENCOUNTER — Other Ambulatory Visit: Payer: Self-pay | Admitting: Hematology and Oncology

## 2021-10-04 ENCOUNTER — Other Ambulatory Visit: Payer: Self-pay | Admitting: Cardiovascular Disease

## 2021-11-10 ENCOUNTER — Ambulatory Visit (HOSPITAL_BASED_OUTPATIENT_CLINIC_OR_DEPARTMENT_OTHER): Payer: Medicare Other | Admitting: Nurse Practitioner

## 2021-11-11 ENCOUNTER — Ambulatory Visit (HOSPITAL_BASED_OUTPATIENT_CLINIC_OR_DEPARTMENT_OTHER): Payer: Medicare Other | Admitting: Nurse Practitioner

## 2021-11-15 ENCOUNTER — Other Ambulatory Visit: Payer: Self-pay | Admitting: Surgery

## 2021-11-15 ENCOUNTER — Ambulatory Visit
Admission: RE | Admit: 2021-11-15 | Discharge: 2021-11-15 | Disposition: A | Discharge status: Home / Self Care | Payer: Medicare Other | Source: Ambulatory Visit | Attending: Surgery | Admitting: Surgery

## 2021-11-15 DIAGNOSIS — C7B8 Other secondary neuroendocrine tumors: Secondary | ICD-10-CM

## 2021-11-15 MED ORDER — IOPAMIDOL (ISOVUE-300) INJECTION 61%
100.0000 mL | Freq: Once | INTRAVENOUS | Status: AC | PRN
  Administered 2021-11-15: 100 mL via INTRAVENOUS

## 2021-12-20 ENCOUNTER — Inpatient Hospital Stay: Payer: Medicare Other | Admitting: Oncology

## 2021-12-20 ENCOUNTER — Inpatient Hospital Stay: Payer: Medicare Other

## 2021-12-20 NOTE — Progress Notes (Signed)
? ? ?Office Visit  ?  ?Patient Name: [name] ?Date of Encounter: 12/22/2021 ? ?Primary Care Provider:  Aura Dials, PA-C ?Primary Cardiologist:  Skeet Latch, MD ?Primary Electrophysiologist: None ?Chief Complaint  ?  ?1 year office visit follow-up for CAD ? ? Patient Profile: ?Takotsubo cardiomyopathy ?HTN ?CAD/MI ?HLD ?CVA (2012) ?Hepatitis C ?Tobacco abuse ?Left Breast CA s/p XRT ? ? Recent Studies: ?12/2017 TTE: EF 50-55%, grade 1 DD and hypokinesis of mid to apical inferior lateral and inferior myocardium ?4/19 LHC: 10% RCA stenosis, EF 25-30% via left ventricular  ?History of Present Illness  ?  ?[name] is a 66 y.o. female with PMH of HTN, HLD, CVA, breast CA, Takotsubo cardiomyopathy. Patient developed Takotsubo cardiomyopathy in 09/2017. Echo completed with EF 50-55% and grade 1 DD with mid apical inferior hypokinesis.  LHC completed with 10% RCA stenosis, and EF 25-30% via left ventriculography. Echo completed 03/2018 with improved EF of 55-60%, wall motion was normal, and grade 1 DD observed with mild AV regurgitation. ? ? She was last seen by Dr. Oval Linsey on 02/2021 for office follow-up.  Patient was euvolemic on exam and stated that she had experienced some swelling in her ankles and abdomen.  She also has achieved smoking cessation utilizing nicotine patch. ? ? ?Since last being seen in our clinic [name] reports that she received a good report from oncologist yesterday regarding progression of liver metastasis.  She is euvolemic on examination today and denies any weight gain recently.  She does report that she has experienced some dizziness and shortness of breath with the last incident occurring in the fall. She is also endorsing chest pain occasionally with exertion but states that it resolves spontaneously with rest.  She discontinued her Zetia and states that she believes this was causing her left flank pain and swelling.  She is currently reluctant to discuss any PCSK9 type  medications at this time.  She denies PND, orthopnea, nausea, vomiting, dizziness, syncope, edema, weight gain, or early satiety. ? ?Past Medical History  ?  ?Past Medical History:  ?Diagnosis Date  ? Adjustment disorder with mixed anxiety and depressed mood 01/05/2013  ? Allergy   ? Anxiety   ? Arthritis   ? "knees,wrists" (07/31/2014)  ? Brain aneurysm 2012  ? 2cms.  ? Breast cancer ([place]) 06/05/2013  ? Breast disorder   ? lump in left breast  ? Carcinoid tumor   ? Complication of anesthesia   ? hallucinations , anxiety   ? Depression   ? H/O echocardiogram   ? not finding report in EPIC, pt. reports that it was before her appt. /w Dr. Johnsie Cancel   ? Hepatitis B dx'd ~ 1980  ? Hepatitis C dx'd ~ 2000  ? "specialist says it cleared itself on it's own"  ? Hx of radiation therapy 07/17/13-09/03/13  ? left breast,60.4Gy, 32/33 fx completed  ? Hyperlipidemia   ? Hypertension   ? "never took my RX" (07/31/2014)  ? Jaundice   ? Malignant neoplasm of lower-outer quadrant of female breast ([place]) 05/19/2013  ? Pain management   ? goes to [facility], but she doesn't intend to return   ? Personal history of radiation therapy   ? 2014  ? Pure hypercholesterolemia 10/23/2019  ? Stroke [facility]) 2012  ? TIA  ? TIA (transient ischemic attack) 06/2011  ? "can't see real well out of left eye since; weaker on left side since"  ? Varicose veins   ? Walking  pneumonia ~ 2008  ? ?Past Surgical History:  ?Procedure Laterality Date  ? BREAST BIOPSY Left 03/2013; 05/2013  ? BREAST LUMPECTOMY Left 04/02/2013  ? Procedure: Excision  of Left Breast Mass;  Surgeon: Harl Bowie, MD;  Location: [facility];  Service: General;  Laterality: Left;  ? BREAST LUMPECTOMY WITH SENTINEL LYMPH NODE BIOPSY Left 06/05/2013  ? Procedure: BREAST LUMPECTOMY WITH SENTINEL LYMPH NODE BX;  Surgeon: Harl Bowie, MD;  Location: [place] [facility];  Service: General;  Laterality: Left;  ? CARDIAC CATHETERIZATION  12/24/2017  ? COLON SURGERY  03/2013  ? bowel obstruction , malignant tumor     ? DILATION AND CURETTAGE OF UTERUS  ~ 1990  ? S/P miscarriage  ? EVACUATION BREAST HEMATOMA Left 06/05/2013  ? Procedure: EVACUATION HEMATOMA BREAST;  Surgeon: Harl Bowie, MD;  Location: [place] [facility];  Service: General;  Laterality: Left;  ? LAPAROSCOPY N/A 04/02/2013  ? Procedure: Diaganostic Lap.; Small Bowel Resection;  Surgeon: Harl Bowie, MD;  Location: [place] [facility];  Service: General;  Laterality: N/A;  ? LEFT HEART CATH AND CORONARY ANGIOGRAPHY N/A 12/24/2017  ? Procedure: LEFT HEART CATH AND CORONARY ANGIOGRAPHY;  Surgeon: Jettie Booze, MD;  Location: [facility] [facility];  Service: Cardiovascular;  Laterality: N/A;  ? TUMOR EXCISION  04/02/2013  ? "carcoid tumor removed"  ? vaginal births   1975  ?  birthed twins at -[redacted] weeks gestation - both survived , total of 3 pregnancies - all born vaginally  ? ? ?Allergies ? ?Allergies  ?Allergen Reactions  ? Statins Swelling and Other (See Comments)  ?  Stomach swelling, muscle aches, and abdominal pain ?  ? Tramadol Anxiety and Other (See Comments)  ?  Extreme muscle pain and agitation ? ?  ? Ace Inhibitors Other (See Comments)  ?  Palpitations  ? Prozac [Fluoxetine] Other (See Comments)  ?  Causes the patient to be JITTERY and WEEPY  ? Wellbutrin [Bupropion]   ?  Agitation, insomnia  ? Ciprofloxacin Hcl Palpitations  ? Sertraline Hcl Diarrhea  ? ? ?Home Medications  ?  ?Current Outpatient Medications  ?Medication Sig Dispense Refill  ? ALPRAZolam (XANAX) 1 MG tablet Take 1 tablet (1 mg total) by mouth 2 (two) times daily as needed for anxiety. 45 tablet 0  ? aspirin EC 81 MG tablet Take 81 mg by mouth daily.    ? lisinopril (ZESTRIL) 10 MG tablet TAKE 1 TABLET BY MOUTH EVERY DAY 90 tablet 3  ? metoprolol tartrate (LOPRESSOR) 50 MG tablet Take one tablet two hours prior to cardiac CTA. 1 tablet 0  ? [START ON 01/21/2022] nicotine (NICODERM CQ - DOSED IN MG/24 HOURS) 14 mg/24hr patch Place 1 patch (14 mg total) onto the skin daily. 28 patch 0  ? nicotine  (NICODERM CQ - DOSED IN MG/24 HOURS) 21 mg/24hr patch Place 1 patch (21 mg total) onto the skin daily. 28 patch 0  ? [START ON 02/21/2022] nicotine (NICODERM CQ - DOSED IN MG/24 HR) 7 mg/24hr patch Place 1 patch (7 mg total) onto the skin daily. 28 patch 0  ? ?No current facility-administered medications for this visit.  ?  ? ?Review of Systems  ?Please see the history of present illness.    ?(+) shortness of breath and dizziness ?(+) Occasional chest pain with exertion ? ?All other systems reviewed and are otherwise negative except as noted above. ? ?Physical Exam  ?  ?Wt Readings from Last 3 Encounters:  ?12/22/21 169 lb 12.8  oz (77 kg)  ?12/21/21 166 lb (75.3 kg)  ?06/21/21 176 lb 9.6 oz (80.1 kg)  ? ?VS: ?Vitals:  ? 12/22/21 1324  ?BP: 128/72  ?Pulse: 64  ?,Body mass index is 27.41 kg/m?. ? ?Constitutional:   ?   Appearance: Healthy appearance. Not in distress.  ?Neck:  ?   Vascular: JVD normal.  ?Pulmonary:  ?   Effort: Pulmonary effort is normal.  ?   Breath sounds: No wheezing. No rales.  ?Cardiovascular:  ?   Normal rate. Regular rhythm. Normal S1. Normal S2.   ?   Murmurs: There is no murmur.  ?Edema: ?   Peripheral edema absent in right extremity with trace amount in left extremity ?Abdominal:  ?   Palpations: Abdomen is soft. There is no hepatomegaly.  ?Skin: ?   General: Skin is warm and dry.  ?Neurological:  ?   General: No focal deficit present.  ?   Mental Status: Alert and oriented to person, place and time.  ?   Cranial Nerves: Cranial nerves are intact.  ?EKG/LABS/Other Studies Reviewed  ?  ?ECG personally reviewed by me today -sinus rhythm with rate of 64- no acute changes. ? ?Lab Results  ?Component Value Date  ? WBC 6.9 04/19/2021  ? HGB 14.1 04/19/2021  ? HCT 43.5 04/19/2021  ? MCV 88.8 04/19/2021  ? PLT 215 04/19/2021  ? ?Lab Results  ?Component Value Date  ? CREATININE 0.72 04/19/2021  ? BUN 13 04/19/2021  ? NA 139 04/19/2021  ? K 4.2 04/19/2021  ? CL 105 04/19/2021  ? CO2 25 04/19/2021   ? ?Lab Results  ?Component Value Date  ? ALT 17 04/19/2021  ? AST 21 04/19/2021  ? ALKPHOS 59 04/19/2021  ? BILITOT 0.9 04/19/2021  ? ?Lab Results  ?Component Value Date  ? CHOL 227 (H) 02/22/2021  ? HDL 66 06/1

## 2021-12-21 ENCOUNTER — Inpatient Hospital Stay: Payer: Medicare Other | Attending: Oncology

## 2021-12-21 ENCOUNTER — Inpatient Hospital Stay (HOSPITAL_BASED_OUTPATIENT_CLINIC_OR_DEPARTMENT_OTHER): Payer: Medicare Other | Admitting: Oncology

## 2021-12-21 VITALS — BP 166/79 | HR 69 | Temp 97.8°F | Resp 18 | Ht 66.0 in | Wt 166.0 lb

## 2021-12-21 DIAGNOSIS — C7A010 Malignant carcinoid tumor of the duodenum: Secondary | ICD-10-CM

## 2021-12-21 DIAGNOSIS — Z8673 Personal history of transient ischemic attack (TIA), and cerebral infarction without residual deficits: Secondary | ICD-10-CM | POA: Insufficient documentation

## 2021-12-21 DIAGNOSIS — I10 Essential (primary) hypertension: Secondary | ICD-10-CM | POA: Insufficient documentation

## 2021-12-21 DIAGNOSIS — C7B02 Secondary carcinoid tumors of liver: Secondary | ICD-10-CM | POA: Insufficient documentation

## 2021-12-21 DIAGNOSIS — I2510 Atherosclerotic heart disease of native coronary artery without angina pectoris: Secondary | ICD-10-CM | POA: Insufficient documentation

## 2021-12-21 DIAGNOSIS — Z8619 Personal history of other infectious and parasitic diseases: Secondary | ICD-10-CM | POA: Diagnosis not present

## 2021-12-21 DIAGNOSIS — F17210 Nicotine dependence, cigarettes, uncomplicated: Secondary | ICD-10-CM | POA: Insufficient documentation

## 2021-12-21 DIAGNOSIS — Z86000 Personal history of in-situ neoplasm of breast: Secondary | ICD-10-CM | POA: Diagnosis not present

## 2021-12-21 NOTE — Progress Notes (Signed)
?  [facility] ?OFFICE PROGRESS NOTE ? ? ?Diagnosis: Carcinoid tumor ? ?INTERVAL HISTORY:  ? ?Ms. Stepanek reports feeling well.  No diarrhea.  Her face is often red in the mornings.  She had a poor appetite several months ago and had COVID in January.  Her appetite is good at present. ? ? ?Objective: ? ?Vital signs in last 24 hours: ? ?Blood pressure (!) 166/79, pulse 69, temperature 97.8 ?F (36.6 ?C), temperature source Oral, resp. rate 18, height '5\' 6"'$  (1.676 m), weight 166 lb (75.3 kg), SpO2 98 %. ?  ? ?Lymphatics: No cervical, supraclavicular, axillary, or inguinal nodes ?Resp: Lungs clear bilaterally ?Cardio: Regular rate and rhythm, no murmur ?GI: No hepatosplenomegaly, no mass, nontender ?Vascular: No leg edema  ?Skin: Mild erythema at the lower anterior neck ?Breast: Status post left lumpectomy.  No evidence for local tumor recurrence.  Left breast without mass ? ?Lab Results: ? ?Lab Results  ?Component Value Date  ? WBC 6.9 04/19/2021  ? HGB 14.1 04/19/2021  ? HCT 43.5 04/19/2021  ? MCV 88.8 04/19/2021  ? PLT 215 04/19/2021  ? NEUTROABS 4.5 09/03/2019  ? ? ?CMP  ?Lab Results  ?Component Value Date  ? NA 139 04/19/2021  ? K 4.2 04/19/2021  ? CL 105 04/19/2021  ? CO2 25 04/19/2021  ? GLUCOSE 98 04/19/2021  ? BUN 13 04/19/2021  ? CREATININE 0.72 04/19/2021  ? CALCIUM 9.6 04/19/2021  ? PROT 7.5 04/19/2021  ? ALBUMIN 4.2 04/19/2021  ? AST 21 04/19/2021  ? ALT 17 04/19/2021  ? ALKPHOS 59 04/19/2021  ? BILITOT 0.9 04/19/2021  ? GFRNONAA >60 04/19/2021  ? GFRAA 107 10/21/2019  ? ? ? ? ?Medications: I have reviewed the patient's current medications. ? ? ?Assessment/Plan: ?Metastatic carcinoid tumor ?Small bowel obstruction July 2014, small bowel resection, ileum-2 cm carcinoid tumor of the duodenum, 2/5 lymph nodes, tumor invaded through the muscularis into subserosal tissues,pT3pN1 ?Isolated right liver lesion noted on multiple imaging studies dating back to 2016 ?CT abdomen/pelvis 03/11/2021-[facility],  increased size of right hepatic lesion measuring up to 3.4 cm ?MRI abdomen 03/25/2021-enhancing right hepatic lesion concerning for malignancy, 3.2 x 3.7 cm ?Ultrasound-guided biopsy of right liver lesion 04/19/2021-well differentiated neuroendocrine tumor ?Netspot 05/11/2021-intense activity associated with the right hepatic lobe metastasis, single 11 mm mesenteric nodule with intense radiotracer activity ?CT abdomen/pelvis 11/15/2021-stable right hepatic metastasis, stable mesenteric lymph node ?2.  Left breast DCIS 2014 ?3.  Hypertension ?4.  Tobacco use ?5.  CAD/MI ?6.  History of a CVA ?7.  History of hepatitis C ? ? ? ? ?Disposition: ?Ms. Tungate has metastatic carcinoid tumor.  She appears asymptomatic.  I suspect the face erythema is unrelated to the carcinoid tumor.  A CT abdomen/pelvis last month revealed no evidence of disease progression.  The plan is to continue observation.  We will follow-up on the chromogranin A level from today.  She will return for an office visit and chromogranin A level in 8 months. ? ?Betsy Coder, MD ? ?12/21/2021  ?8:09 AM ? ? ?

## 2021-12-22 ENCOUNTER — Ambulatory Visit (INDEPENDENT_AMBULATORY_CARE_PROVIDER_SITE_OTHER): Payer: Medicare Other | Admitting: Nurse Practitioner

## 2021-12-22 ENCOUNTER — Encounter (HOSPITAL_BASED_OUTPATIENT_CLINIC_OR_DEPARTMENT_OTHER): Payer: Self-pay | Admitting: Nurse Practitioner

## 2021-12-22 VITALS — BP 128/72 | HR 64 | Ht 66.0 in | Wt 169.8 lb

## 2021-12-22 DIAGNOSIS — I25118 Atherosclerotic heart disease of native coronary artery with other forms of angina pectoris: Secondary | ICD-10-CM

## 2021-12-22 DIAGNOSIS — E7800 Pure hypercholesterolemia, unspecified: Secondary | ICD-10-CM

## 2021-12-22 DIAGNOSIS — I5181 Takotsubo syndrome: Secondary | ICD-10-CM | POA: Diagnosis not present

## 2021-12-22 DIAGNOSIS — I10 Essential (primary) hypertension: Secondary | ICD-10-CM | POA: Diagnosis not present

## 2021-12-22 DIAGNOSIS — Z720 Tobacco use: Secondary | ICD-10-CM | POA: Diagnosis not present

## 2021-12-22 LAB — CHROMOGRANIN A: Chromogranin A (ng/mL): 131.6 ng/mL — ABNORMAL HIGH (ref 0.0–101.8)

## 2021-12-22 MED ORDER — NICOTINE 14 MG/24HR TD PT24: 14.0000 mg | MEDICATED_PATCH | Freq: Every day | TRANSDERMAL | 0 refills | Status: DC

## 2021-12-22 MED ORDER — NICOTINE 21 MG/24HR TD PT24: 21.0000 mg | MEDICATED_PATCH | Freq: Every day | TRANSDERMAL | 0 refills | Status: DC

## 2021-12-22 MED ORDER — METOPROLOL TARTRATE 50 MG PO TABS: ORAL_TABLET | ORAL | 0 refills | Status: DC

## 2021-12-22 MED ORDER — NICOTINE 7 MG/24HR TD PT24: 7.0000 mg | MEDICATED_PATCH | Freq: Every day | TRANSDERMAL | 0 refills | Status: DC

## 2021-12-22 NOTE — Patient Instructions (Addendum)
Medication Instructions:  ?Your physician has recommended you make the following change in your medication:   ? ?START Nicotine patch ?'21mg'$  patch for 1 month then '14mg'$  patch for 1 month then 7 mg patch for 1 month then discontinue ? ?TAKE Metoprolol tartrate '50mg'$  two hours prior to cardiac CTA ? ?*If you need a refill on your cardiac medications before your next appointment, please call your pharmacy* ? ? ?Lab Work: ?Your physician recommends that you return for lab work today: BMP  ?  ?If you have labs (blood work) drawn today and your tests are completely normal, you will receive your results only by: ?MyChart Message (if you have MyChart) OR ?A paper copy in the mail ?If you have any lab test that is abnormal or we need to change your treatment, we will call you to review the results. ? ?Testing/Procedures: ?Your physician has requested that you have cardiac CT. Cardiac computed tomography (CT) is a painless test that uses an x-ray machine to take clear, detailed pictures of your heart. Please follow instruction sheet as given. ?  ?Follow-Up: ?At [facility], you and your health needs are our priority.  As part of our continuing mission to provide you with exceptional heart care, we have created designated Provider Care Teams.  These Care Teams include your primary Cardiologist (physician) and Advanced Practice Providers (APPs -  Physician Assistants and Nurse Practitioners) who all work together to provide you with the care you need, when you need it. ? ?We recommend signing up for the patient portal called "MyChart".  Sign up information is provided on this After Visit Summary.  MyChart is used to connect with patients for Virtual Visits (Telemedicine).  Patients are able to view lab/test results, encounter notes, upcoming appointments, etc.  Non-urgent messages can be sent to your provider as well.   ?To learn more about what you can do with MyChart, go to [email or web address].   ? ?Your next  appointment:   ?3 month(s) ? ?The format for your next appointment:   ?In Person ? ?Provider:   ?Skeet Latch, MD  ? ? ?Other Instructions ? ? ?Your cardiac CT will be scheduled at ? ?[facility] ?[address] ?[place], [place] [address] ?[phone] ? ?If scheduled at [facility], please arrive at the [facility] En[facility] (Entrance C2) of [facility] 30 minutes prior to test start time. ?You can use the FREE valet parking offered at entrance C (encouraged to control the heart rate for the test)  ?Proceed to the [facility] Radiology Department (first floor) to check-in and test prep. ? ?All radiology patients and guests should use entrance C2 at [facility], accessed from [facility], even though the hospital's physical address listed is [address]. ? ? ? ? ?Please follow these instructions carefully (unless otherwise directed): ? ? ?On the Night Before the Test: ?Be sure to Drink plenty of water. ?Do not consume any caffeinated/decaffeinated beverages or chocolate 12 hours prior to your test. ?Do not take any antihistamines 12 hours prior to your test. ? ?On the Day of the Test: ?Drink plenty of water until 1 hour prior to the test. ?Do not eat any food 4 hours prior to the test. ?You may take your regular medications prior to the test.  ?Take metoprolol (Lopressor) two hours prior to test. ?FEMALES- please wear underwire-free bra if available, avoid dresses & tight clothing ?     ?After the Test: ?Drink  plenty of water. ?After receiving IV contrast, you may experience a mild flushed feeling. This is normal. ?On occasion, you may experience a mild rash up to 24 hours after the test. This is not dangerous. If this occurs, you can take Benadryl 25 mg and increase your fluid intake. ?If you experience trouble breathing, this can be serious. If it is severe call 911 IMMEDIATELY. If it is mild, please call our office. ?If you take any of  these medications: Glipizide/Metformin, Avandament, Glucavance, please do not take 48 hours after completing test unless otherwise instructed. ? ?We will call to schedule your test 2-4 weeks out understanding that some insurance companies will need an authorization prior to the service being performed.  ? ?For non-scheduling related questions, please contact the cardiac imaging nurse navigator should you have any questions/concerns: ?Marchia Bond, Cardiac Imaging Nurse Navigator ?Gordy Clement, Cardiac Imaging Nurse Navigator ?[facility] ?Direct Office Dial: [phone]  ? ?For scheduling needs, including cancellations and rescheduling, please call [place], [phone].  ? ? ? ?Managing the Challenge of Quitting Smoking ?Quitting smoking is a physical and mental challenge. You will face cravings, withdrawal symptoms, and temptation. Before quitting, work with your health care provider to make a plan that can help you manage quitting. Preparation can help you quit and keep you from giving in. ?How to manage lifestyle changes ?Managing stress ?Stress can make you want to smoke, and wanting to smoke may cause stress. It is important to find ways to manage your stress. You might try some of the following: ?Practice relaxation techniques. ?Breathe slowly and deeply, in through your nose and out through your mouth. ?Listen to music. ?Soak in a bath or take a shower. ?Imagine a peaceful place or vacation. ?Get some support. ?Talk with family or friends about your stress. ?Join a support group. ?Talk with a counselor or therapist. ?Get some physical activity. ?Go for a walk, run, or bike ride. ?Play a favorite sport. ?Practice yoga. ? ?Medicines ?Talk with your health care provider about medicines that might help you deal with cravings and make quitting easier for you. ?Relationships ?Social situations can be difficult when you are quitting smoking. To manage this, you can: ?Avoid parties and  other social situations where people might be smoking. ?Avoid alcohol. ?Leave right away if you have the urge to smoke. ?Explain to your family and friends that you are quitting smoking. Ask for support and let them know you might be a bit grumpy. ?Plan activities where smoking is not an option. ?General instructions ?Be aware that many people gain weight after they quit smoking. However, not everyone does. To keep from gaining weight, have a plan in place before you quit and stick to the plan after you quit. Your plan should include: ?Having healthy snacks. When you have a craving, it may help to: ?Eat popcorn, carrots, celery, or other cut vegetables. ?Chew sugar-free gum. ?Changing how you eat. ?Eat small portion sizes at meals. ?Eat 4-6 small meals throughout the day instead of 1-2 large meals a day. ?Be mindful when you eat. Do not watch television or do other things that might distract you as you eat. ?Exercising regularly. ?Make time to exercise each day. If you do not have time for a long workout, do short bouts of exercise for 5-10 minutes several times a day. ?Do some form of strengthening exercise, such as weight lifting. ?Do some exercise that gets your heart beating and causes you to breathe deeply, such  as walking fast, running, swimming, or biking. This is very important. ?Drinking plenty of water or other low-calorie or no-calorie drinks. Drink 6-8 glasses of water daily. ? ?How to recognize withdrawal symptoms ?Your body and mind may experience discomfort as you try to get used to not having nicotine in your system. These effects are called withdrawal symptoms. They may include: ?Feeling hungrier than normal. ?Having trouble concentrating. ?Feeling irritable or restless. ?Having trouble sleeping. ?Feeling depressed. ?Craving a cigarette. ?To manage withdrawal symptoms: ?Avoid places, people, and activities that trigger your cravings. ?Remember why you want to quit. ?Get plenty of sleep. ?Avoid  coffee and other caffeinated drinks. These may worsen some of your symptoms. ?These symptoms may surprise you. But be assured that they are normal to have when quitting smoking. ?How to manage cravings ?Come up with a

## 2021-12-23 ENCOUNTER — Telehealth: Payer: Self-pay

## 2021-12-23 ENCOUNTER — Telehealth (HOSPITAL_BASED_OUTPATIENT_CLINIC_OR_DEPARTMENT_OTHER): Payer: Self-pay

## 2021-12-23 LAB — BASIC METABOLIC PANEL
BUN/Creatinine Ratio: 12 (ref 12–28)
BUN: 10 mg/dL (ref 8–27)
CO2: 20 mmol/L (ref 20–29)
Calcium: 9.5 mg/dL (ref 8.7–10.3)
Chloride: 106 mmol/L (ref 96–106)
Creatinine, Ser: 0.82 mg/dL (ref 0.57–1.00)
Glucose: 90 mg/dL (ref 70–99)
Potassium: 4.4 mmol/L (ref 3.5–5.2)
Sodium: 142 mmol/L (ref 134–144)
eGFR: 79 mL/min/{1.73_m2} (ref >59)

## 2021-12-23 NOTE — Telephone Encounter (Addendum)
Results called to patient who verbalizes understanding!  ? ? ? ?----- Message from Marylu Lund., NP sent at 12/23/2021  7:40 AM EDT ----- ?Stable kidney function. Normal electrolytes. Good result!  ? ?Ambrose Pancoast, NP ?

## 2021-12-23 NOTE — Telephone Encounter (Signed)
-----   Message from Ladell Pier, MD sent at 12/22/2021  4:54 PM EDT ----- ?Please call patient, the chromogranin a level is stable, follow-up as scheduled ? ?

## 2021-12-23 NOTE — Telephone Encounter (Signed)
V/M message left for Pt relaying message. Informed Pt to give a return call if any further questions. ?

## 2022-01-24 ENCOUNTER — Telehealth (HOSPITAL_COMMUNITY): Payer: Self-pay | Admitting: Emergency Medicine

## 2022-01-24 NOTE — Telephone Encounter (Signed)
Reaching out to patient to offer assistance regarding upcoming cardiac imaging study; pt verbalizes understanding of appt date/time, parking situation and where to check in, pre-test NPO status and medications ordered, and verified current allergies; name and call back number provided for further questions should they arise ?Marchia Bond RN Navigator Cardiac Imaging ?[facility] ?[phone] office ?[phone] cell ? ? ?Restriction L arm! ?'50mg'$  metoprolol tartrate  ?Arrival 900 ? ?

## 2022-01-25 ENCOUNTER — Ambulatory Visit (HOSPITAL_COMMUNITY)
Admission: RE | Admit: 2022-01-25 | Discharge: 2022-01-25 | Disposition: A | Discharge status: Home / Self Care | Payer: Medicare Other | Source: Ambulatory Visit | Attending: Nurse Practitioner | Admitting: Nurse Practitioner

## 2022-01-25 DIAGNOSIS — I25118 Atherosclerotic heart disease of native coronary artery with other forms of angina pectoris: Secondary | ICD-10-CM | POA: Insufficient documentation

## 2022-01-25 MED ORDER — IOHEXOL 350 MG/ML SOLN
100.0000 mL | Freq: Once | INTRAVENOUS | Status: AC | PRN
  Administered 2022-01-25: 100 mL via INTRAVENOUS

## 2022-01-25 MED ORDER — NITROGLYCERIN 0.4 MG SL SUBL
SUBLINGUAL_TABLET | SUBLINGUAL | Status: AC
  Filled 2022-01-25: qty 2

## 2022-01-25 MED ORDER — NITROGLYCERIN 0.4 MG SL SUBL
0.8000 mg | SUBLINGUAL_TABLET | Freq: Once | SUBLINGUAL | Status: AC
  Administered 2022-01-25: 0.8 mg via SUBLINGUAL

## 2022-02-28 ENCOUNTER — Other Ambulatory Visit: Payer: Self-pay | Admitting: Oncology

## 2022-02-28 DIAGNOSIS — Z853 Personal history of malignant neoplasm of breast: Secondary | ICD-10-CM

## 2022-04-05 ENCOUNTER — Ambulatory Visit
Admission: RE | Admit: 2022-04-05 | Discharge: 2022-04-05 | Disposition: A | Discharge status: Home / Self Care | Payer: Medicare Other | Source: Ambulatory Visit | Attending: Oncology | Admitting: Oncology

## 2022-04-05 ENCOUNTER — Other Ambulatory Visit: Payer: Self-pay | Admitting: Oncology

## 2022-04-05 DIAGNOSIS — N644 Mastodynia: Secondary | ICD-10-CM

## 2022-04-05 DIAGNOSIS — N6320 Unspecified lump in the left breast, unspecified quadrant: Secondary | ICD-10-CM

## 2022-04-05 DIAGNOSIS — Z853 Personal history of malignant neoplasm of breast: Secondary | ICD-10-CM

## 2022-04-11 ENCOUNTER — Other Ambulatory Visit: Payer: Self-pay | Admitting: Oncology

## 2022-04-11 DIAGNOSIS — N6320 Unspecified lump in the left breast, unspecified quadrant: Secondary | ICD-10-CM

## 2022-04-12 ENCOUNTER — Telehealth (HOSPITAL_BASED_OUTPATIENT_CLINIC_OR_DEPARTMENT_OTHER): Payer: Self-pay | Admitting: Cardiovascular Disease

## 2022-04-12 NOTE — Telephone Encounter (Signed)
Called and spoke to patient about rescheduling the appointment she cancelled for tomorrow 04/13/2022. She said she would call back and I put her on a waitlist if something opens up sooner. Patient also stated that she would like calls instead of messages through mychart.

## 2022-04-13 ENCOUNTER — Ambulatory Visit (HOSPITAL_BASED_OUTPATIENT_CLINIC_OR_DEPARTMENT_OTHER): Payer: Medicaid Other | Admitting: Cardiovascular Disease

## 2022-04-14 ENCOUNTER — Ambulatory Visit
Admission: RE | Admit: 2022-04-14 | Discharge: 2022-04-14 | Disposition: A | Discharge status: Home / Self Care | Payer: Medicare Other | Source: Ambulatory Visit | Attending: Oncology | Admitting: Oncology

## 2022-04-14 DIAGNOSIS — N6320 Unspecified lump in the left breast, unspecified quadrant: Secondary | ICD-10-CM

## 2022-06-01 ENCOUNTER — Ambulatory Visit (INDEPENDENT_AMBULATORY_CARE_PROVIDER_SITE_OTHER): Payer: Medicare Other | Admitting: Family

## 2022-06-01 ENCOUNTER — Encounter (HOSPITAL_BASED_OUTPATIENT_CLINIC_OR_DEPARTMENT_OTHER): Payer: Self-pay | Admitting: Family

## 2022-06-01 VITALS — BP 140/80 | HR 70 | Ht 66.0 in | Wt 164.0 lb

## 2022-06-01 DIAGNOSIS — I5181 Takotsubo syndrome: Secondary | ICD-10-CM | POA: Diagnosis not present

## 2022-06-01 DIAGNOSIS — Z8673 Personal history of transient ischemic attack (TIA), and cerebral infarction without residual deficits: Secondary | ICD-10-CM | POA: Diagnosis not present

## 2022-06-01 DIAGNOSIS — E785 Hyperlipidemia, unspecified: Secondary | ICD-10-CM | POA: Diagnosis not present

## 2022-06-01 DIAGNOSIS — I25118 Atherosclerotic heart disease of native coronary artery with other forms of angina pectoris: Secondary | ICD-10-CM

## 2022-06-01 NOTE — Patient Instructions (Addendum)
Medication Instructions:  Continue your current medications.   *If you need a refill on your cardiac medications before your next appointment, please call your pharmacy*  Lab Work: None ordered today.   Testing/Procedures: None ordered today.   Follow-Up: At [facility], you and your health needs are our priority.  As part of our continuing mission to provide you with exceptional heart care, we have created designated Provider Care Teams.  These Care Teams include your primary Cardiologist (physician) and Advanced Practice Providers (APPs -  Physician Assistants and Nurse Practitioners) who all work together to provide you with the care you need, when you need it.  We recommend signing up for the patient portal called "MyChart".  Sign up information is provided on this After Visit Summary.  MyChart is used to connect with patients for Virtual Visits (Telemedicine).  Patients are able to view lab/test results, encounter notes, upcoming appointments, etc.  Non-urgent messages can be sent to your provider as well.   To learn more about what you can do with MyChart, go to [email or web address].    Your next appointment:   3 month(s)  The format for your next appointment:  In Person  Provider:   Skeet Latch, MD or Laurann Montana, NP    Other Instructions  Tips to Measure your Blood Pressure Correctly  Here's what you can do to ensure a correct reading:  Don't drink a caffeinated beverage or smoke during the 30 minutes before the test.  Sit quietly for five minutes before the test begins.  During the measurement, sit in a chair with your feet on the floor and your arm supported so your elbow is at about heart level.  The inflatable part of the cuff should completely cover at least 80% of your upper arm, and the cuff should be placed on bare skin, not over a shirt.  Don't talk during the measurement.  Blood pressure categories  Blood pressure category SYSTOLIC (upper  number)  DIASTOLIC (lower number)  Normal Less than 120 mm Hg and Less than 80 mm Hg  Elevated 120-129 mm Hg and Less than 80 mm Hg  High blood pressure: Stage 1 hypertension 130-139 mm Hg or 80-89 mm Hg  High blood pressure: Stage 2 hypertension 140 mm Hg or higher or 90 mm Hg or higher  Hypertensive crisis (consult your doctor immediately) Higher than 180 mm Hg and/or Higher than 120 mm Hg  Source: American Heart Association and American Stroke Association. For more on getting your blood pressure under control, buy Controlling Your Blood Pressure, a Special Health Report from [facility].   Blood Pressure Log   Date   Time  Blood Pressure  Example: Nov 1 9 AM 124/78                                           Important Information About Sugar

## 2022-06-01 NOTE — Progress Notes (Signed)
Office Visit    Patient Name: [name] Date of Encounter: 06/01/2022  PCP:  Spencer, Sara C, [place]  Cardiologist:  Skeet Latch, MD  Advanced Practice Provider:  No care team member to display Electrophysiologist:  None      Chief Complaint    [name] is a 66 y.o. female presents today for follow up of CAD   Past Medical History    Past Medical History:  Diagnosis Date   Adjustment disorder with mixed anxiety and depressed mood 01/05/2013   Allergy    Anxiety    Arthritis    "knees,wrists" (07/31/2014)   Brain aneurysm 2012   2cms.   Breast cancer ([place]) 06/05/2013   Breast disorder    lump in left breast   Carcinoid tumor    Complication of anesthesia    hallucinations , anxiety    Depression    H/O echocardiogram    not finding report in EPIC, pt. reports that it was before her appt. /w Dr. Johnsie Cancel    Hepatitis B dx'd ~ 1980   Hepatitis C dx'd ~ 2000   "specialist says it cleared itself on it's own"   Hx of radiation therapy 07/17/13-09/03/13   left breast,60.4Gy, 32/33 fx completed   Hyperlipidemia    Hypertension    "never took my RX" (07/31/2014)   Jaundice    Malignant neoplasm of lower-outer quadrant of female breast ([place]) 05/19/2013   Pain management    [facility] to [facility], but she doesn't intend to return    Personal history of radiation therapy    2014   Pure hypercholesterolemia 10/23/2019   Stroke ([place]) 2012   TIA   TIA (transient ischemic attack) 06/2011   "can't see real well out of left eye since; weaker on left side since"   Varicose veins    Walking pneumonia ~ 2008   Past Surgical History:  Procedure Laterality Date   BREAST BIOPSY Left 03/2013; 05/2013   BREAST LUMPECTOMY Left 04/02/2013   Procedure: Excision  of Left Breast Mass;  Surgeon: Harl Bowie, MD;  Location: [facility];  Service: General;  Laterality: Left;   BREAST LUMPECTOMY WITH SENTINEL LYMPH NODE BIOPSY Left 06/05/2013    Procedure: BREAST LUMPECTOMY WITH SENTINEL LYMPH NODE BX;  Surgeon: Harl Bowie, MD;  Location: [facility];  Service: General;  Laterality: Left;   CARDIAC CATHETERIZATION  12/24/2017   COLON SURGERY  03/2013   bowel obstruction , malignant tumor     DILATION AND CURETTAGE OF UTERUS  ~ 1990   S/P miscarriage   EVACUATION BREAST HEMATOMA Left 06/05/2013   Procedure: EVACUATION HEMATOMA BREAST;  Surgeon: Harl Bowie, MD;  Location: [facility];  Service: General;  Laterality: Left;   LAPAROSCOPY N/A 04/02/2013   Procedure: Diaganostic Lap.; Small Bowel Resection;  Surgeon: Harl Bowie, MD;  Location: [facility];  Service: General;  Laterality: N/A;   LEFT HEART CATH AND CORONARY ANGIOGRAPHY N/A 12/24/2017   Procedure: LEFT HEART CATH AND CORONARY ANGIOGRAPHY;  Surgeon: Jettie Booze, MD;  Location: [facility];  Service: Cardiovascular;  Laterality: N/A;   TUMOR EXCISION  04/02/2013   "carcoid tumor removed"   vaginal births   1975    birthed twins at -[redacted] weeks gestation - both survived , total of 3 pregnancies - all born vaginally    Allergies  Allergies  Allergen Reactions   Statins Swelling and Other (See  Comments)    Stomach swelling, muscle aches, and abdominal pain    Tramadol Anxiety and Other (See Comments)    Extreme muscle pain and agitation     Ace Inhibitors Other (See Comments)    Palpitations   Prozac [Fluoxetine] Other (See Comments)    Causes the patient to be JITTERY and WEEPY   Wellbutrin [Bupropion]     Agitation, insomnia   Ciprofloxacin Hcl Palpitations   Sertraline Hcl Diarrhea    History of Present Illness    [name] is a 66 y.o. female with a hx of hypertension, hyperlipidemia, CVA, breast cancer, nonobstructive CAD, Takotsubo cardiomyopathy, nonobstructive coronary artery disease last seen 12/22/21.  Diagnosed with Takotsubo cardiomyopathy January 2019.  EF 69-62%, grade 1 diastolic dysfunction with mid apical inferior hypokinesis.   LHC with 10% RCA stenosis and EF 25-30% via left ventriculography.  Echo 03/2018 EF 55 to 60%, normal wall motion, grade 1 DD, mild AI.  Seen 12/22/2021.  She was euvolemic on exam.  Noted occasional chest pain. She had stopped Zetia due to perceived as causing left flank pain and swelling. She deferred PCSK9i.  Cardiac CTA ordered to reassess for angina. Cardiac CTA 01/25/2022 with minimal CAD with less than 25% stenosis of the RCA.  Noted severe left atrial enlargement, mild right atrial enlargement.  She presents today for follow up. She reports "not feeling good" but cannot provide further details. . A few days ago PCP resumed Zetia and increased Lisinopril to '40mg'$  daily as her BP in clinic was 180/98. She was wrist and arm cuff which has not been checked for accuracy, but not checking BP at home.   Denies lightheadedness, dizziness, orthopnea, PND, edema. She has a constant chest pressure under her left breast which is tender on palpation and has been present for many years. No exertional chest discomfort. Mowed half acre yard the other day and had to stop due to dyspnea, occasional palpitations. She does try to stay active.  She is frustrated by continued smoking. Previously used '14mg'$  patch but was not able to decrease to the '7mg'$  patch. She says they were cost prohibitive. Encouraged to call 1-800-QUITNOW.  05/29/22 AST 17, ALT 17, alkaline phosphatase 76, albumin 4.5, bilirubin 0.5  EKGs/Labs/Other Studies Reviewed:   The following studies were reviewed today:   EKG:  No EKG today  Recent Labs: 12/22/2021: BUN 10; Creatinine, Ser 0.82; Potassium 4.4; Sodium 142  Recent Lipid Panel    Component Value Date/Time   CHOL 227 (H) 02/22/2021 0000   TRIG 151 (H) 02/22/2021 0000   HDL 66 02/22/2021 0000   CHOLHDL 3.4 02/22/2021 0000   CHOLHDL 2.6 12/24/2017 0953   VLDL 26 12/24/2017 0953   LDLCALC 134 (H) 02/22/2021 0000    Home Medications   Current Meds  Medication Sig   ALPRAZolam  (XANAX) 1 MG tablet Take 1 tablet (1 mg total) by mouth 2 (two) times daily as needed for anxiety.   aspirin EC 81 MG tablet Take 81 mg by mouth daily.   ezetimibe (ZETIA) 10 MG tablet Take 10 mg by mouth daily.   lisinopril (ZESTRIL) 20 MG tablet Take 20 mg by mouth daily.   metoprolol tartrate (LOPRESSOR) 50 MG tablet Take one tablet two hours prior to cardiac CTA.     Review of Systems      All other systems reviewed and are otherwise negative except as noted above.  Physical Exam    VS:  BP (!) 140/80  Pulse 70   Ht '5\' 6"'$  (1.676 m)   Wt 164 lb (74.4 kg)   BMI 26.47 kg/m  , BMI Body mass index is 26.47 kg/m.  Wt Readings from Last 3 Encounters:  06/01/22 164 lb (74.4 kg)  12/22/21 169 lb 12.8 oz (77 kg)  12/21/21 166 lb (75.3 kg)     GEN: Well nourished, well developed, in no acute distress. HEENT: normal. Neck: Supple, no JVD, carotid bruits, or masses. Cardiac: RRR, no murmurs, rubs, or gallops. No clubbing, cyanosis, edema.  Radials/PT 2+ and equal bilaterally.  Respiratory:  Respirations regular and unlabored, clear to auscultation bilaterally. GI: Soft, nontender, nondistended. MS: No deformity or atrophy. Skin: Warm and dry, no rash. Neuro:  Strength and sensation are intact. Psych: Normal affect. Anxious.   Assessment & Plan    Takutsubo cardiomyopathy - Now with normalized LVEF. Euvolemic and well compensated on exam.   HTN -BP not at goal less than 130/80.  Her lisinopril was increased earlier this week to 20 mg by primary care provider.  Check in via MyChart if in 1 week.  If BP remains above goal of 130/80 plan to further increase therapy.  CAD -  Cardiac CTA 01/25/22 with RCA <25% stenosis.  Present chest discomfort atypical of angina as it is under her left breast, constant, tender on palpation.  Likely etiology musculoskeletal.  No indication for ischemic evaluation.  GDMT aspirin, Zetia. Heart healthy diet and regular cardiovascular exercise encouraged.     HLD - Previously statin intolerant. PCP recently resumed Zetia. Refer to lipid clinic for consideration of injectable therapies as LDL goal <70. She is most interested in Incliseron.   Tobacco use - Smoking cessation encouraged. Recommend utilization of 1800QUITNOW. Offered referral to psychology which she will consider.       Disposition: Follow up in 3 month(s) with Skeet Latch, MD or APP.  Signed, Loel Dubonnet, NP 06/01/2022, 2:28 PM [place]

## 2022-06-02 ENCOUNTER — Encounter (HOSPITAL_BASED_OUTPATIENT_CLINIC_OR_DEPARTMENT_OTHER): Payer: Self-pay | Admitting: Family

## 2022-06-02 ENCOUNTER — Telehealth (HOSPITAL_BASED_OUTPATIENT_CLINIC_OR_DEPARTMENT_OTHER): Payer: Self-pay | Admitting: Family

## 2022-06-02 NOTE — Telephone Encounter (Signed)
Left message for patient to call and schedule the Echocardiogram ordered by Caitlin Walker, NP 

## 2022-06-06 ENCOUNTER — Other Ambulatory Visit: Payer: Self-pay | Admitting: Physician Assistant

## 2022-06-06 DIAGNOSIS — E2839 Other primary ovarian failure: Secondary | ICD-10-CM

## 2022-06-08 ENCOUNTER — Encounter (HOSPITAL_BASED_OUTPATIENT_CLINIC_OR_DEPARTMENT_OTHER): Payer: Self-pay

## 2022-06-08 DIAGNOSIS — I10 Essential (primary) hypertension: Secondary | ICD-10-CM

## 2022-06-08 NOTE — Telephone Encounter (Signed)
BP log:

## 2022-06-08 NOTE — Telephone Encounter (Signed)
See duplicate MyChart encounter.   Loel Dubonnet, NP

## 2022-06-13 ENCOUNTER — Encounter (HOSPITAL_BASED_OUTPATIENT_CLINIC_OR_DEPARTMENT_OTHER): Payer: Self-pay

## 2022-06-13 LAB — BASIC METABOLIC PANEL
BUN/Creatinine Ratio: 18 (ref 12–28)
BUN: 13 mg/dL (ref 8–27)
CO2: 21 mmol/L (ref 20–29)
Calcium: 9 mg/dL (ref 8.7–10.3)
Chloride: 103 mmol/L (ref 96–106)
Creatinine, Ser: 0.73 mg/dL (ref 0.57–1.00)
Glucose: 99 mg/dL (ref 70–99)
Potassium: 4.5 mmol/L (ref 3.5–5.2)
Sodium: 140 mmol/L (ref 134–144)
eGFR: 91 mL/min/{1.73_m2} (ref >59)

## 2022-06-19 ENCOUNTER — Ambulatory Visit (INDEPENDENT_AMBULATORY_CARE_PROVIDER_SITE_OTHER): Payer: 59

## 2022-06-19 DIAGNOSIS — I5181 Takotsubo syndrome: Secondary | ICD-10-CM | POA: Diagnosis not present

## 2022-06-19 LAB — ECHOCARDIOGRAM COMPLETE
Area-P 1/2: 2.99 cm2
P 1/2 time: 376 msec
S' Lateral: 3.13 cm

## 2022-06-19 NOTE — Progress Notes (Unsigned)
Upon arrival patient expressed not feeling well. Patient stated she threw up last night and has felt extremely fatigued since last night. Patient denies any chest pain but does have a dull pain in her back. Patient did not feel it necessary to go the ER. Myself and patient spoke with Elonda Husky, RN after blood pressure reading of 180/90. Kaila advised patient to continue her medication as prescribed. Kaila advised patient to keep blood pressure readings and check in with nurse on Wednesday. I informed patient if she started to feel worse or felt it was necessary she should go to the ER. Patient verbalized understanding.    Leavy Cella, RDCS, RVT

## 2022-08-10 ENCOUNTER — Telehealth: Payer: Self-pay

## 2022-08-10 ENCOUNTER — Other Ambulatory Visit: Payer: Self-pay

## 2022-08-10 DIAGNOSIS — C7A010 Malignant carcinoid tumor of the duodenum: Secondary | ICD-10-CM

## 2022-08-10 NOTE — Telephone Encounter (Signed)
Mrs.Kees called in stating she's in severe pain. The back and left shoulder down to her thigh are constant in pain. She been in pain for months, last month the pain got worsen. She request pain medication. Per Ned Card she can not prescript her a narcotics with out seeing her. I advice the patient to take Tylenol until she come in. Patient is schedule to come in on 08/14/22 with Lattie Haw.

## 2022-08-14 ENCOUNTER — Encounter: Payer: Self-pay | Admitting: Nurse Practitioner

## 2022-08-14 ENCOUNTER — Inpatient Hospital Stay: Payer: 59

## 2022-08-14 ENCOUNTER — Inpatient Hospital Stay: Payer: 59 | Attending: Oncology | Admitting: Nurse Practitioner

## 2022-08-14 VITALS — BP 152/72 | HR 71 | Temp 98.1°F | Resp 16 | Wt 166.4 lb

## 2022-08-14 DIAGNOSIS — C7B02 Secondary carcinoid tumors of liver: Secondary | ICD-10-CM | POA: Diagnosis not present

## 2022-08-14 DIAGNOSIS — R110 Nausea: Secondary | ICD-10-CM | POA: Insufficient documentation

## 2022-08-14 DIAGNOSIS — M25512 Pain in left shoulder: Secondary | ICD-10-CM | POA: Diagnosis not present

## 2022-08-14 DIAGNOSIS — C7A010 Malignant carcinoid tumor of the duodenum: Secondary | ICD-10-CM

## 2022-08-14 DIAGNOSIS — R1012 Left upper quadrant pain: Secondary | ICD-10-CM | POA: Diagnosis not present

## 2022-08-14 LAB — COMPREHENSIVE METABOLIC PANEL
ALT: 16 U/L (ref 0–44)
AST: 15 U/L (ref 15–41)
Albumin: 4.5 g/dL (ref 3.5–5.0)
Alkaline Phosphatase: 61 U/L (ref 38–126)
Anion gap: 8 (ref 5–15)
BUN: 14 mg/dL (ref 8–23)
CO2: 27 mmol/L (ref 22–32)
Calcium: 9.3 mg/dL (ref 8.9–10.3)
Chloride: 106 mmol/L (ref 98–111)
Creatinine, Ser: 0.79 mg/dL (ref 0.44–1.00)
GFR, Estimated: >60 mL/min (ref >60)
Glucose, Bld: 101 mg/dL — ABNORMAL HIGH (ref 70–99)
Potassium: 4.2 mmol/L (ref 3.5–5.1)
Sodium: 141 mmol/L (ref 135–145)
Total Bilirubin: 0.5 mg/dL (ref 0.3–1.2)
Total Protein: 7.1 g/dL (ref 6.5–8.1)

## 2022-08-14 LAB — CBC WITH DIFFERENTIAL (CANCER CENTER ONLY)
Abs Immature Granulocytes: 0.02 10*3/uL (ref 0.00–0.07)
Basophils Absolute: 0 10*3/uL (ref 0.0–0.1)
Basophils Relative: 0 %
Eosinophils Absolute: 0.1 10*3/uL (ref 0.0–0.5)
Eosinophils Relative: 2 %
HCT: 41.1 % (ref 36.0–46.0)
Hemoglobin: 13.3 g/dL (ref 12.0–15.0)
Immature Granulocytes: 0 %
Lymphocytes Relative: 31 %
Lymphs Abs: 2.3 10*3/uL (ref 0.7–4.0)
MCH: 28.8 pg (ref 26.0–34.0)
MCHC: 32.4 g/dL (ref 30.0–36.0)
MCV: 89 fL (ref 80.0–100.0)
Monocytes Absolute: 0.4 10*3/uL (ref 0.1–1.0)
Monocytes Relative: 6 %
Neutro Abs: 4.5 10*3/uL (ref 1.7–7.7)
Neutrophils Relative %: 61 %
Platelet Count: 228 10*3/uL (ref 150–400)
RBC: 4.62 MIL/uL (ref 3.87–5.11)
RDW: 13.6 % (ref 11.5–15.5)
WBC Count: 7.3 10*3/uL (ref 4.0–10.5)
nRBC: 0 % (ref 0.0–0.2)

## 2022-08-14 MED ORDER — HYDROCODONE-ACETAMINOPHEN 5-325 MG PO TABS: 1.0000 | ORAL_TABLET | Freq: Three times a day (TID) | ORAL | 0 refills | Status: DC | PRN

## 2022-08-14 NOTE — Progress Notes (Signed)
  [place] OFFICE PROGRESS NOTE   Diagnosis: Carcinoid tumor  INTERVAL HISTORY:   [name] returns prior to scheduled follow-up for evaluation of pain.  She reports standing pain at the left upper abdomen "for years".  The pain has worsened significantly over the past 2 to 3 months.  She is taking 1000 mg of Tylenol several times a day without relief.  She notes improvement if she applies a heating pad and decreases activity.  She has tried ibuprofen with no improvement.  She also has pain at the "left shoulder blade".  The left scapula pain radiates to her arm and the anterior chest.  She has had this pain for 6 to 7 months.  She feels her face is "redder".  She has had a single flushing episode since her last office visit.  No diarrhea.  Appetite is decreased.  She has occasional nausea.  Objective:  Vital signs in last 24 hours:  Blood pressure (!) 152/72, pulse 71, temperature 98.1 F (36.7 C), temperature source Oral, resp. rate 16, weight 166 lb 6.4 oz (75.5 kg), SpO2 97 %.    Lymphatics: No palpable cervical, lower or axillary lymph nodes. Resp: Lungs clear bilaterally. Cardio: Regular rate and rhythm. GI: Abdomen soft.  Tender at the left upper abdomen.  No mass.  No splenomegaly.  No hepatomegaly. Vascular: No leg edema.   Lab Results:  Lab Results  Component Value Date   WBC 7.3 08/14/2022   HGB 13.3 08/14/2022   HCT 41.1 08/14/2022   MCV 89.0 08/14/2022   PLT 228 08/14/2022   NEUTROABS 4.5 08/14/2022    Imaging:  No results found.  Medications: I have reviewed the patient's current medications.  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, 2/5 lymph nodes, tumor invaded through the muscularis into subserosal tissues,pT3pN1 Isolated right liver lesion noted on multiple imaging studies dating back to 2016 CT abdomen/pelvis 03/11/2021-[facility],  increased size of right hepatic lesion measuring up to 3.4 cm MRI abdomen 03/25/2021-enhancing right hepatic lesion concerning for malignancy, 3.2 x 3.7 cm Ultrasound-guided biopsy of right liver lesion 04/19/2021-well differentiated neuroendocrine tumor Netspot 05/11/2021-intense activity associated with the right hepatic lobe metastasis, single 11 mm mesenteric nodule with intense radiotracer activity CT abdomen/pelvis 11/15/2021-stable right hepatic metastasis, stable mesenteric lymph node 2.  Left breast DCIS 2014 3.  Hypertension 4.  Tobacco use 5.  CAD/MI 6.  History of a CVA 7.  History of hepatitis C  Disposition: [name] has metastatic carcinoid.  Most recent CTs 11/15/2021 showed stable disease.  She presents today with worsening of chronic left upper abdominal pain.  We decided to obtain a restaging CT scan.  She is taking a significant amount of Tylenol.  We discussed alternative pain medication.  Ibuprofen has not been effective.  She is unable to tolerate tramadol.  She will try hydrocodone 5/325 1 tablet every 8 hours as needed.  She understands she should not take Xanax when taking pain medication.  She understands she should not be driving while taking pain medication.  She will return for follow-up in approximately 2 weeks.    Ned Card ANP/GNP-BC   08/14/2022  12:49 PM

## 2022-08-15 LAB — CHROMOGRANIN A: Chromogranin A (ng/mL): 156.7 ng/mL — ABNORMAL HIGH (ref 0.0–101.8)

## 2022-08-16 ENCOUNTER — Ambulatory Visit (HOSPITAL_COMMUNITY)
Admission: RE | Admit: 2022-08-16 | Discharge: 2022-08-16 | Disposition: A | Discharge status: Home / Self Care | Payer: 59 | Source: Ambulatory Visit | Attending: Nurse Practitioner | Admitting: Nurse Practitioner

## 2022-08-16 DIAGNOSIS — C7A010 Malignant carcinoid tumor of the duodenum: Secondary | ICD-10-CM | POA: Insufficient documentation

## 2022-08-16 MED ORDER — IOHEXOL 300 MG/ML  SOLN
100.0000 mL | Freq: Once | INTRAMUSCULAR | Status: AC | PRN
  Administered 2022-08-16: 100 mL via INTRAVENOUS

## 2022-08-21 ENCOUNTER — Encounter: Payer: Self-pay | Admitting: Nurse Practitioner

## 2022-08-21 ENCOUNTER — Inpatient Hospital Stay (HOSPITAL_BASED_OUTPATIENT_CLINIC_OR_DEPARTMENT_OTHER): Payer: 59 | Admitting: Nurse Practitioner

## 2022-08-21 ENCOUNTER — Encounter: Payer: Self-pay | Admitting: *Deleted

## 2022-08-21 VITALS — BP 162/86 | HR 76 | Temp 98.2°F | Resp 20 | Ht 66.0 in | Wt 169.8 lb

## 2022-08-21 DIAGNOSIS — C7A010 Malignant carcinoid tumor of the duodenum: Secondary | ICD-10-CM | POA: Diagnosis not present

## 2022-08-21 NOTE — Progress Notes (Signed)
Referral for GI faxed to [facility] at [phone] for appt

## 2022-08-21 NOTE — Progress Notes (Signed)
  [place] OFFICE PROGRESS NOTE   Diagnosis: Carcinoid tumor  INTERVAL HISTORY:   [name] returns as scheduled.  She was seen in an unscheduled visit 08/14/2022 due to worsening pain located at the left upper abdomen.  Left upper abdominal pain overall is unchanged, seems a little better today.  No nausea or vomiting.  No change in bowel habits.  Objective:  Vital signs in last 24 hours:  Blood pressure (!) 162/86, pulse 76, temperature 98.2 F (36.8 C), resp. rate 20, height '5\' 6"'$  (1.676 m), weight 169 lb 12.8 oz (77 kg), SpO2 98 %.    Resp: Lungs clear bilaterally. Cardio: Regular rate and rhythm. GI: Abdomen is soft.  Tender at the left upper abdomen.  No hepatosplenomegaly.  No mass. Vascular: No leg edema.    Lab Results:  Lab Results  Component Value Date   WBC 7.3 08/14/2022   HGB 13.3 08/14/2022   HCT 41.1 08/14/2022   MCV 89.0 08/14/2022   PLT 228 08/14/2022   NEUTROABS 4.5 08/14/2022    Imaging:  No results found.  Medications: I have reviewed the patient's current medications.  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, 2/5 lymph nodes, tumor invaded through the muscularis into subserosal tissues,pT3pN1 Isolated right liver lesion noted on multiple imaging studies dating back to 2016 CT abdomen/pelvis 03/11/2021-[facility], increased size of right hepatic lesion measuring up to 3.4 cm MRI abdomen 03/25/2021-enhancing right hepatic lesion concerning for malignancy, 3.2 x 3.7 cm Ultrasound-guided biopsy of right liver lesion 04/19/2021-well differentiated neuroendocrine tumor Netspot 05/11/2021-intense activity associated with the right hepatic lobe metastasis, single 11 mm mesenteric nodule with intense radiotracer activity CT abdomen/pelvis 11/15/2021-stable right hepatic metastasis, stable mesenteric lymph node CT abdomen/pelvis 08/16/2022 no  acute findings.  Stable right hepatic lobe mass.  Stable borderline enlarged lymph nodes in the small bowel mesentery 2.  Left breast DCIS 2014 3.  Hypertension 4.  Tobacco use 5.  CAD/MI 6.  History of a CVA 7.  History of hepatitis C    Disposition: [name] appears unchanged.  She continues to have left upper abdominal pain.  Recent CT scan was stable.  We are referring her to gastroenterology.  She will return for a chromogranin A level and follow-up visit in approximately 4 months.  We are available to see her sooner if needed.  Plan reviewed with Dr. Benay Spice.    Ned Card ANP/GNP-BC   08/21/2022  12:00 PM

## 2022-08-24 ENCOUNTER — Other Ambulatory Visit: Payer: Medicaid Other

## 2022-08-24 ENCOUNTER — Ambulatory Visit: Payer: Medicaid Other | Admitting: Oncology

## 2022-09-01 ENCOUNTER — Ambulatory Visit (HOSPITAL_BASED_OUTPATIENT_CLINIC_OR_DEPARTMENT_OTHER): Payer: Medicaid Other | Admitting: Family

## 2022-09-14 ENCOUNTER — Other Ambulatory Visit: Payer: Self-pay | Admitting: Oncology

## 2022-09-14 DIAGNOSIS — Z853 Personal history of malignant neoplasm of breast: Secondary | ICD-10-CM

## 2022-11-14 ENCOUNTER — Ambulatory Visit
Admission: RE | Admit: 2022-11-14 | Discharge: 2022-11-14 | Disposition: A | Discharge status: Home / Self Care | Payer: 59 | Source: Ambulatory Visit | Attending: Oncology | Admitting: Oncology

## 2022-11-14 DIAGNOSIS — Z853 Personal history of malignant neoplasm of breast: Secondary | ICD-10-CM

## 2022-11-25 ENCOUNTER — Encounter (HOSPITAL_BASED_OUTPATIENT_CLINIC_OR_DEPARTMENT_OTHER): Payer: Self-pay | Admitting: Emergency Medicine

## 2022-11-25 ENCOUNTER — Emergency Department (HOSPITAL_BASED_OUTPATIENT_CLINIC_OR_DEPARTMENT_OTHER)
Admission: EM | Admit: 2022-11-25 | Discharge: 2022-11-25 | Disposition: A | Discharge status: Home / Self Care | Payer: 59 | Attending: Emergency Medicine | Admitting: Emergency Medicine

## 2022-11-25 ENCOUNTER — Emergency Department (HOSPITAL_BASED_OUTPATIENT_CLINIC_OR_DEPARTMENT_OTHER): Payer: 59 | Admitting: Radiology

## 2022-11-25 ENCOUNTER — Other Ambulatory Visit: Payer: Self-pay

## 2022-11-25 DIAGNOSIS — I498 Other specified cardiac arrhythmias: Secondary | ICD-10-CM | POA: Insufficient documentation

## 2022-11-25 DIAGNOSIS — I10 Essential (primary) hypertension: Secondary | ICD-10-CM | POA: Diagnosis not present

## 2022-11-25 DIAGNOSIS — Z79899 Other long term (current) drug therapy: Secondary | ICD-10-CM | POA: Insufficient documentation

## 2022-11-25 DIAGNOSIS — Z1152 Encounter for screening for COVID-19: Secondary | ICD-10-CM | POA: Insufficient documentation

## 2022-11-25 DIAGNOSIS — Z7982 Long term (current) use of aspirin: Secondary | ICD-10-CM | POA: Diagnosis not present

## 2022-11-25 DIAGNOSIS — R002 Palpitations: Secondary | ICD-10-CM | POA: Insufficient documentation

## 2022-11-25 DIAGNOSIS — R0609 Other forms of dyspnea: Secondary | ICD-10-CM | POA: Insufficient documentation

## 2022-11-25 LAB — BASIC METABOLIC PANEL
Anion gap: 7 (ref 5–15)
BUN: 13 mg/dL (ref 8–23)
CO2: 25 mmol/L (ref 22–32)
Calcium: 9.4 mg/dL (ref 8.9–10.3)
Chloride: 108 mmol/L (ref 98–111)
Creatinine, Ser: 0.65 mg/dL (ref 0.44–1.00)
GFR, Estimated: >60 mL/min (ref >60)
Glucose, Bld: 89 mg/dL (ref 70–99)
Potassium: 3.9 mmol/L (ref 3.5–5.1)
Sodium: 140 mmol/L (ref 135–145)

## 2022-11-25 LAB — RESP PANEL BY RT-PCR (RSV, FLU A&B, COVID)  RVPGX2
Influenza A by PCR: NEGATIVE
Influenza B by PCR: NEGATIVE
Resp Syncytial Virus by PCR: NEGATIVE
SARS Coronavirus 2 by RT PCR: NEGATIVE

## 2022-11-25 LAB — TROPONIN I (HIGH SENSITIVITY)
Troponin I (High Sensitivity): 7 ng/L (ref <18)
Troponin I (High Sensitivity): 7 ng/L (ref <18)

## 2022-11-25 LAB — CBC
HCT: 41 % (ref 36.0–46.0)
Hemoglobin: 13.5 g/dL (ref 12.0–15.0)
MCH: 28.5 pg (ref 26.0–34.0)
MCHC: 32.9 g/dL (ref 30.0–36.0)
MCV: 86.7 fL (ref 80.0–100.0)
Platelets: 202 10*3/uL (ref 150–400)
RBC: 4.73 MIL/uL (ref 3.87–5.11)
RDW: 13.2 % (ref 11.5–15.5)
WBC: 6.5 10*3/uL (ref 4.0–10.5)
nRBC: 0 % (ref 0.0–0.2)

## 2022-11-25 LAB — BRAIN NATRIURETIC PEPTIDE: B Natriuretic Peptide: 135.7 pg/mL — ABNORMAL HIGH (ref 0.0–100.0)

## 2022-11-25 MED ORDER — KETOROLAC TROMETHAMINE 15 MG/ML IJ SOLN
15.0000 mg | Freq: Once | INTRAMUSCULAR | Status: AC
  Administered 2022-11-25: 15 mg via INTRAVENOUS
  Filled 2022-11-25: qty 1

## 2022-11-25 MED ORDER — ONDANSETRON HCL 4 MG/2ML IJ SOLN
4.0000 mg | Freq: Once | INTRAMUSCULAR | Status: AC
  Administered 2022-11-25: 4 mg via INTRAVENOUS
  Filled 2022-11-25: qty 2

## 2022-11-25 MED ORDER — ACETAMINOPHEN 325 MG PO TABS: 650.0000 mg | ORAL_TABLET | Freq: Once | ORAL | Status: DC

## 2022-11-25 NOTE — Discharge Instructions (Signed)
You were seen in the emergency department for your heart palpitations and chest pain as well as your high blood pressure.  Your workup here showed no signs of heart attack or abnormal heart rhythms, you tested negative for COVID and flu and had no signs of pneumonia or fluid on your lungs.  Unclear what is causing your symptoms at this time but you can continue to take Tylenol and Motrin and you should follow-up with your primary doctor or your cardiologist to have your symptoms rechecked.  You will likely need an adjustment made to your blood pressure medications as well since you have had several high readings over the last few days.  You should return to the emergency department for significantly worsening chest pain, severe shortness of breath, if you pass out or if you have any other new or concerning symptoms.

## 2022-11-25 NOTE — ED Triage Notes (Signed)
Pt reports hypertension, "heart fluttering", chest pain, cough, and headache.

## 2022-11-25 NOTE — ED Provider Notes (Signed)
[facility] Provider Note   CSN: [id number] Arrival date & time: 11/25/22  0825     History  Chief Complaint  Patient presents with   Hypertension    [name] is a 67 y.o. female.  Patient is a 67 year old female with a past medical history of hypertension and carcinoid tumor presenting to the emergency department with chest pressure and palpitations.  Patient states over the last few days her blood pressures have been high and fluctuating between the 170s to 120s.  She states that she has been feeling generally unwell for the last few days with chest pressures, dyspnea on exertion, headaches and cough.  She denies any fevers or chills.  She states that she has been nauseous but denies any vomiting or diarrhea.  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  She states that last night she started to have intermittent fluttering in her chest as well which prompted her to come to the ER today.  She states that she has been taking her medications as prescribed.  The history is provided by the patient.  Hypertension       Home Medications Prior to Admission medications   Medication Sig Start Date End Date Taking? Authorizing Provider  ALPRAZolam Duanne Moron) 1 MG tablet Take 1 tablet (1 mg total) by mouth 2 (two) times daily as needed for anxiety. 06/04/15   Nicholas Lose, MD  amLODipine (NORVASC) 5 MG tablet Take 5 mg by mouth daily. 07/04/22   [provider]  aspirin EC 81 MG tablet Take 81 mg by mouth daily.    [provider]  HYDROcodone-acetaminophen (NORCO) 5-325 MG tablet Take 1 tablet by mouth every 8 (eight) hours as needed for severe pain. Do take take when taking Xanax. Do not drive while taking. 08/14/22   Owens Shark, NP  lisinopril (ZESTRIL) 20 MG tablet Take 20 mg by mouth daily. Taking 30 mg  05/29/22   [provider]  metoprolol tartrate (LOPRESSOR) 50 MG tablet Take one tablet two hours prior to cardiac CTA. 12/22/21   Marylu Lund., NP      Allergies    Statins, Tramadol, Ace inhibitors, Prozac [fluoxetine], Wellbutrin [bupropion], Ciprofloxacin hcl, and Sertraline hcl    Review of Systems   Review of Systems  Physical Exam Updated Vital Signs BP (!) 186/80   Pulse (!) 59   Temp 98.5 F (36.9 C) (Oral)   Resp 15   SpO2 98%  Physical Exam Vitals and nursing note reviewed.  Constitutional:      General: She is not in acute distress.    Appearance: Normal appearance.  HENT:     Head: Normocephalic and atraumatic.     Nose: Nose normal.     Mouth/Throat:     Mouth: Mucous membranes are moist.     Pharynx: Oropharynx is clear.  Eyes:     Extraocular Movements: Extraocular movements intact.     Conjunctiva/sclera: Conjunctivae normal.  Cardiovascular:     Rate and Rhythm: Normal rate and regular rhythm.     Heart sounds: Normal heart sounds.  Pulmonary:     Effort: Pulmonary effort is normal.     Breath sounds: Normal breath sounds.  Abdominal:     General: Abdomen is flat.     Palpations: Abdomen is soft.     Tenderness:  There is no abdominal tenderness.  Musculoskeletal:        General: Normal range of motion.     Cervical back: Normal range of motion and neck supple.     Right lower leg: No edema.     Left lower leg: No edema.  Skin:    General: Skin is warm and dry.  Neurological:     General: No focal deficit present.     Mental Status: She is alert and oriented to person, place, and time.  Psychiatric:        Mood and Affect: Mood normal.        Behavior: Behavior normal.     ED Results / Procedures / Treatments   Labs (all labs ordered are listed, but only abnormal results are displayed) Labs Reviewed  BRAIN NATRIURETIC PEPTIDE - Abnormal; Notable for the following components:      Result Value   B Natriuretic Peptide 135.7  (*)    All other components within normal limits  RESP PANEL BY RT-PCR (RSV, FLU A&B, COVID)  [id number]  BASIC METABOLIC PANEL  CBC  TROPONIN I (HIGH SENSITIVITY)  TROPONIN I (HIGH SENSITIVITY)    EKG EKG Interpretation  Date/Time:  Saturday November 25 2022 08:36:25 EDT Ventricular Rate:  74 PR Interval:  150 QRS Duration: 98 QT Interval:  389 QTC Calculation: 432 R Axis:   2 Text Interpretation: Sinus rhythm No significant change since last tracing Confirmed by Leanord Asal ([id number]) on 11/25/2022 8:38:01 AM  Radiology DG Chest 2 View  Result Date: 11/25/2022 : Pt reports hypertension, "heart fluttering", chest pain, cough, and headache chest pain EXAM: CHEST - 2 VIEW COMPARISON:  09/03/2019 FINDINGS: Normal mediastinum and cardiac silhouette. Normal pulmonary vasculature. No evidence of effusion, infiltrate, or pneumothorax. No acute bony abnormality. IMPRESSION: No acute cardiopulmonary process. Electronically Signed   By: Suzy Bouchard M.D.   On: 11/25/2022 09:16    Procedures Procedures    Medications Ordered in ED Medications  acetaminophen (TYLENOL) tablet 650 mg (650 mg Oral Not Given 11/25/22 0956)  ondansetron (ZOFRAN) injection 4 mg (4 mg Intravenous Given 11/25/22 0939)  ketorolac (TORADOL) 15 MG/ML injection 15 mg (15 mg Intravenous Given 11/25/22 1049)    ED Course/ Medical Decision Making/ A&P                             Medical Decision Making This patient presents to the ED with chief complaint(s) of palpitations, chest pain with pertinent past medical history of HTN, carcinoid tumor which further complicates the presenting complaint. The complaint involves an extensive differential diagnosis and also carries with it a high risk of complications and morbidity.    The differential diagnosis includes ACS, arrhythmia, anemia, electrolyte abnormality, pulmonary edema, pleural effusion, pneumonia, pneumothorax, viral syndrome, dehydration  Additional history  obtained: Additional history obtained from N/A Records reviewed Primary Care Documents  ED Course and Reassessment: On patient's arrival to the emergency department she is awake alert well-appearing in no acute distress.  She had EKG performed on arrival that was in normal sinus rhythm without acute ischemic changes.  She will have labs including troponin and BNP, chest x-ray and viral swab evaluate for cause of her symptoms and she will be closely reassessed.  She will be given Tylenol and Zofran for symptomatic management.  Independent labs interpretation:  The following labs were independently interpreted: within normal range  Independent visualization of imaging: - I independently visualized  the following imaging with scope of interpretation limited to determining acute life threatening conditions related to emergency care: CXR, which revealed no acute disease  Consultation: - Consulted or discussed management/test interpretation w/ external professional: N/A  Consideration for admission or further workup: Patient has no emergent conditions requiring admission or further work-up at this time and is stable for discharge home with primary care follow-up  Social Determinants of health: N/A    Amount and/or Complexity of Data Reviewed Labs: ordered. Radiology: ordered.  Risk OTC drugs. Prescription drug management.          Final Clinical Impression(s) / ED Diagnoses Final diagnoses:  Palpitations  Hypertension, unspecified type    Rx / DC Orders ED Discharge Orders     None         Kemper Durie, DO 11/25/22 1211

## 2022-12-15 ENCOUNTER — Encounter (HOSPITAL_COMMUNITY): Payer: Self-pay

## 2022-12-15 ENCOUNTER — Emergency Department (HOSPITAL_COMMUNITY): Payer: 59

## 2022-12-15 ENCOUNTER — Other Ambulatory Visit: Payer: 59

## 2022-12-15 ENCOUNTER — Ambulatory Visit (HOSPITAL_COMMUNITY)
Admission: EM | Admit: 2022-12-15 | Discharge: 2022-12-15 | Disposition: A | Discharge status: Home / Self Care | Payer: 59

## 2022-12-15 ENCOUNTER — Emergency Department (HOSPITAL_COMMUNITY)
Admission: EM | Admit: 2022-12-15 | Discharge: 2022-12-15 | Disposition: A | Discharge status: Home / Self Care | Payer: 59 | Attending: Emergency Medicine | Admitting: Emergency Medicine

## 2022-12-15 DIAGNOSIS — R5383 Other fatigue: Secondary | ICD-10-CM | POA: Diagnosis not present

## 2022-12-15 DIAGNOSIS — R197 Diarrhea, unspecified: Secondary | ICD-10-CM | POA: Diagnosis not present

## 2022-12-15 DIAGNOSIS — Z85038 Personal history of other malignant neoplasm of large intestine: Secondary | ICD-10-CM | POA: Insufficient documentation

## 2022-12-15 DIAGNOSIS — Z8500 Personal history of malignant neoplasm of unspecified digestive organ: Secondary | ICD-10-CM | POA: Diagnosis not present

## 2022-12-15 DIAGNOSIS — R1010 Upper abdominal pain, unspecified: Secondary | ICD-10-CM

## 2022-12-15 DIAGNOSIS — R1012 Left upper quadrant pain: Secondary | ICD-10-CM

## 2022-12-15 DIAGNOSIS — Z7982 Long term (current) use of aspirin: Secondary | ICD-10-CM | POA: Diagnosis not present

## 2022-12-15 LAB — CBC WITH DIFFERENTIAL/PLATELET
Abs Immature Granulocytes: 0.02 10*3/uL (ref 0.00–0.07)
Basophils Absolute: 0 10*3/uL (ref 0.0–0.1)
Basophils Relative: 0 %
Eosinophils Absolute: 0 10*3/uL (ref 0.0–0.5)
Eosinophils Relative: 0 %
HCT: 44.1 % (ref 36.0–46.0)
Hemoglobin: 14 g/dL (ref 12.0–15.0)
Immature Granulocytes: 0 %
Lymphocytes Relative: 20 %
Lymphs Abs: 1.9 10*3/uL (ref 0.7–4.0)
MCH: 28.2 pg (ref 26.0–34.0)
MCHC: 31.7 g/dL (ref 30.0–36.0)
MCV: 88.9 fL (ref 80.0–100.0)
Monocytes Absolute: 0.5 10*3/uL (ref 0.1–1.0)
Monocytes Relative: 5 %
Neutro Abs: 7 10*3/uL (ref 1.7–7.7)
Neutrophils Relative %: 75 %
Platelets: 238 10*3/uL (ref 150–400)
RBC: 4.96 MIL/uL (ref 3.87–5.11)
RDW: 13.6 % (ref 11.5–15.5)
WBC: 9.4 10*3/uL (ref 4.0–10.5)
nRBC: 0 % (ref 0.0–0.2)

## 2022-12-15 LAB — LIPASE, BLOOD: Lipase: 22 U/L (ref 11–51)

## 2022-12-15 LAB — COMPREHENSIVE METABOLIC PANEL
ALT: 17 U/L (ref 0–44)
AST: 18 U/L (ref 15–41)
Albumin: 4.3 g/dL (ref 3.5–5.0)
Alkaline Phosphatase: 62 U/L (ref 38–126)
Anion gap: 12 (ref 5–15)
BUN: 9 mg/dL (ref 8–23)
CO2: 25 mmol/L (ref 22–32)
Calcium: 9.7 mg/dL (ref 8.9–10.3)
Chloride: 104 mmol/L (ref 98–111)
Creatinine, Ser: 0.85 mg/dL (ref 0.44–1.00)
GFR, Estimated: >60 mL/min (ref >60)
Glucose, Bld: 99 mg/dL (ref 70–99)
Potassium: 4.6 mmol/L (ref 3.5–5.1)
Sodium: 141 mmol/L (ref 135–145)
Total Bilirubin: 0.5 mg/dL (ref 0.3–1.2)
Total Protein: 7.1 g/dL (ref 6.5–8.1)

## 2022-12-15 MED ORDER — SODIUM CHLORIDE 0.9 % IV BOLUS
1000.0000 mL | Freq: Once | INTRAVENOUS | Status: AC
  Administered 2022-12-15: 1000 mL via INTRAVENOUS

## 2022-12-15 MED ORDER — IOHEXOL 350 MG/ML SOLN
75.0000 mL | Freq: Once | INTRAVENOUS | Status: AC | PRN
  Administered 2022-12-15: 75 mL via INTRAVENOUS

## 2022-12-15 MED ORDER — ONDANSETRON 4 MG PO TBDP
4.0000 mg | ORAL_TABLET | Freq: Once | ORAL | Status: AC
  Administered 2022-12-15: 4 mg via ORAL
  Filled 2022-12-15: qty 1

## 2022-12-15 MED ORDER — HYDROCODONE-ACETAMINOPHEN 5-325 MG PO TABS
1.0000 | ORAL_TABLET | Freq: Once | ORAL | Status: AC
  Administered 2022-12-15: 1 via ORAL
  Filled 2022-12-15: qty 1

## 2022-12-15 NOTE — Discharge Instructions (Addendum)
Sent to the ED via POV

## 2022-12-15 NOTE — ED Triage Notes (Signed)
Pt c/o LUQ abdominal pain, fatigue, N/V, diarrhea (3 episodes today). Denies fever, CP, SOB. Hx of ileum carcinoma.

## 2022-12-15 NOTE — ED Provider Notes (Signed)
[facility]    CSN: [id number] Arrival date & time: 12/15/22  1643      History   Chief Complaint Chief Complaint  Patient presents with   Emesis   Diarrhea    HPI [name] is a 67 y.o. female.  Here with 2 day history of abdominal pain, vomiting, diarrhea Reports the pain is 15/10. Mostly in the left upper quadrant.  One episode of vomiting after drinking Gatorade  Stool was soft and "jelly-like" yesterday, today just liquid Reports her belly looks swollen  She has history of malignant tumor of the duodenum with mets to liver, hepatitis C, SBO  She is concerned about a parasite  Past Medical History:  Diagnosis Date   Adjustment disorder with mixed anxiety and depressed mood 01/05/2013   Allergy    Anxiety    Arthritis    "knees,wrists" (07/31/2014)   Brain aneurysm 2012   2cms.   Breast cancer 06/05/2013   Breast disorder    lump in left breast   Carcinoid tumor    Complication of anesthesia    hallucinations , anxiety    Depression    H/O echocardiogram    not finding report in EPIC, pt. reports that it was before her appt. /w Dr. Eden Emms    Hepatitis B dx'd ~ 1980   Hepatitis C dx'd ~ 2000   "specialist says it cleared itself on it's own"   Hx of radiation therapy 07/17/13-09/03/13   left breast,60.4Gy, 32/33 fx completed   Hyperlipidemia    Hypertension    "never took my RX" (07/31/2014)   Jaundice    Malignant neoplasm of lower-outer quadrant of female breast 05/19/2013   Pain management    goes to [facility] clinic, but she doesn't intend to return    Personal history of radiation therapy    2014   Pure hypercholesterolemia 10/23/2019   Stroke 2012   TIA   TIA (transient ischemic attack) 06/2011   "can't see real well out of left eye since; weaker on left side since"   Varicose veins    Walking pneumonia ~ 2008    Patient Active Problem List   Diagnosis Date Noted   Pure hypercholesterolemia 10/23/2019   Takotsubo cardiomyopathy  12/25/2017   Cerebrovascular accident (CVA)    Elevated troponin    NSTEMI (non-ST elevated myocardial infarction) 12/23/2017   Malignant carcinoid tumor of duodenum 11/18/2013   Ductal carcinoma in situ (DCIS) of left breast 04/07/2013   Partial small bowel obstruction 04/07/2013   Adjustment disorder with mixed anxiety and depressed mood 01/05/2013   Chronic pain syndrome 01/05/2013   Thyroid cyst 01/05/2013   Tobacco abuse 12/05/2012   Hepatitis C    Varicose veins    Brain aneurysm    TIA (transient ischemic attack) 06/12/2011    Past Surgical History:  Procedure Laterality Date   BREAST BIOPSY Left 03/2013; 05/2013   BREAST LUMPECTOMY Left 04/02/2013   Procedure: Excision  of Left Breast Mass;  Surgeon: Shelly Rubenstein, MD;  Location: [facility] OR;  Service: General;  Laterality: Left;   BREAST LUMPECTOMY WITH SENTINEL LYMPH NODE BIOPSY Left 06/05/2013   Procedure: BREAST LUMPECTOMY WITH SENTINEL LYMPH NODE BX;  Surgeon: Shelly Rubenstein, MD;  Location: [facility] OR;  Service: General;  Laterality: Left;   CARDIAC CATHETERIZATION  12/24/2017   COLON SURGERY  03/2013   bowel obstruction , malignant tumor     DILATION AND CURETTAGE OF UTERUS  ~ 1990   S/P  miscarriage   EVACUATION BREAST HEMATOMA Left 06/05/2013   Procedure: EVACUATION HEMATOMA BREAST;  Surgeon: Shelly Rubenstein, MD;  Location: [facility] OR;  Service: General;  Laterality: Left;   LAPAROSCOPY N/A 04/02/2013   Procedure: Diaganostic Lap.; Small Bowel Resection;  Surgeon: Shelly Rubenstein, MD;  Location: [facility] OR;  Service: General;  Laterality: N/A;   LEFT HEART CATH AND CORONARY ANGIOGRAPHY N/A 12/24/2017   Procedure: LEFT HEART CATH AND CORONARY ANGIOGRAPHY;  Surgeon: Corky Crafts, MD;  Location[facility] IN[facility]AS[facility] CV [facility];  Service: Cardiovascular;  Laterality: N/A;   TUMOR EXCISION  04/02/2013   "carcoid tumor removed"   vaginal births   1975    birthed twins at -[redacted] weeks gestation - both survived , total of 3 pregnancies - all  born vaginally    OB History     Gravida  5   Para  4   Term      Preterm  2   AB  1   Living  4      SAB      IAB  1   Ectopic      Multiple      Live Births               Home Medications    Prior to Admission medications   Medication Sig Start Date End Date Taking? Authorizing Provider  amLODipine (NORVASC) 5 MG tablet Take 5 mg by mouth daily. 07/04/22  Yes [provider]  aspirin EC 81 MG tablet Take 81 mg by mouth daily.   Yes [provider]  lisinopril (ZESTRIL) 20 MG tablet Take 20 mg by mouth daily. Taking 30 mg 05/29/22  Yes [provider]  metoprolol tartrate (LOPRESSOR) 50 MG tablet Take one tablet two hours prior to cardiac CTA. 12/22/21  Yes Gaston Islam., NP  ALPRAZolam Prudy Feeler) 1 MG tablet Take 1 tablet (1 mg total) by mouth 2 (two) times daily as needed for anxiety. 06/04/15   Serena Croissant, MD  HYDROcodone-acetaminophen (NORCO) 5-325 MG tablet Take 1 tablet by mouth every 8 (eight) hours as needed for severe pain. Do take take when taking Xanax. Do not drive while taking. 08/14/22   Rana Snare, NP    Family History Family History  Problem Relation Age of Onset   Diabetes Paternal Grandfather    Diabetes Paternal Grandmother    Diabetes Mother    Heart disease Mother    Cancer Mother        cervical   Diabetes Brother    Stroke Brother    Heart disease Brother    Heart disease Sister    Diabetes Sister    Cancer Sister        cervical   Arrhythmia Sister    Cancer Father        lung    Social History Social History   Tobacco Use   Smoking status: Some Days    Packs/day: 0.50    Years: 36.00    Additional pack years: 0.00    Total pack years: 18.00    Types: Cigarettes    Last attempt to quit: 12/22/2017    Years since quitting: 4.9   Smokeless tobacco: Never   Tobacco comments:    smokes some days, use patch some days  Vaping Use   Vaping Use: Never used  Substance Use Topics    Alcohol use: Yes    Alcohol/week: 3.0 standard drinks of alcohol  Types: 3 Glasses of wine per week   Drug use: Yes    Types: Marijuana    Comment: "last marijuana ~ 06/2014     Allergies   Statins, Tramadol, Ace inhibitors, Prozac [fluoxetine], Wellbutrin [bupropion], Ciprofloxacin hcl, and Sertraline hcl   Review of Systems Review of Systems As per HPI  Physical Exam Triage Vital Signs ED Triage Vitals [12/15/22 1652]  Enc Vitals Group     BP (!) 145/82     Pulse Rate 88     Resp 16     Temp 97.7 F (36.5 C)     Temp Source Oral     SpO2 95 %     Weight      Height      Head Circumference      Peak Flow      Pain Score      Pain Loc      Pain Edu?      Excl. in GC?    No data found.  Updated Vital Signs BP (!) 145/82 (BP Location: Left Arm)   Pulse 88   Temp 97.7 F (36.5 C) (Oral)   Resp 16   SpO2 95%   Physical Exam Vitals and nursing note reviewed.  Constitutional:      Appearance: She is not toxic-appearing or diaphoretic.     Comments: Appears uncomfortable   HENT:     Nose: No rhinorrhea.     Mouth/Throat:     Mouth: Mucous membranes are moist.     Pharynx: Oropharynx is clear. No posterior oropharyngeal erythema.  Eyes:     General: No scleral icterus.    Conjunctiva/sclera: Conjunctivae normal.  Cardiovascular:     Rate and Rhythm: Normal rate and regular rhythm.     Heart sounds: Normal heart sounds.  Pulmonary:     Effort: Pulmonary effort is normal.     Breath sounds: Normal breath sounds.  Abdominal:     General: Abdomen is protuberant.     Palpations: Abdomen is soft. There is no mass.     Tenderness: There is abdominal tenderness in the epigastric area, periumbilical area and left upper quadrant. There is no guarding or rebound.  Musculoskeletal:     Cervical back: Normal range of motion.  Skin:    General: Skin is warm and dry.  Neurological:     Mental Status: She is alert and oriented to person, place, and time.      UC Treatments / Results  Labs (all labs ordered are listed, but only abnormal results are displayed) Labs Reviewed - No data to display  EKG   Radiology No results found.  Procedures Procedures (including critical care time)  Medications Ordered in UC Medications - No data to display  Initial Impression / Assessment and Plan / UC Course  I have reviewed the triage vital signs and the nursing notes.  Pertinent labs & imaging results that were available during my care of the patient were reviewed by me and considered in my medical decision making (see chart for details).  Vitals are stable and she is afebrile On exam she is very uncomfortable and rates pain 15/10 With her severe pain and significant medical history, I have advised her to go to the ED for further eval. Needs higher level of care and advanced imaging not available in the UC Patient requests to go via POV although transport was offered  Final Clinical Impressions(s) / UC Diagnoses   Final diagnoses:  Pain of  upper abdomen  History of GI tract cancer     Discharge Instructions      Sent to the ED via POV     ED Prescriptions   None    PDMP not reviewed this encounter.   Shakerra Red, Lurena Joiner, [place] 12/15/22 1712

## 2022-12-15 NOTE — ED Notes (Signed)
ED provider at bedside.

## 2022-12-15 NOTE — ED Triage Notes (Signed)
Pt reports 2 days of diarrhea and vomiting. Pt think she has parasitic in her poop.

## 2022-12-15 NOTE — ED Provider Notes (Signed)
[place] EMERGENCY DEPARTMENT AT [facility] Provider Note   CSN: [id number] Arrival date & time: 12/15/22  1713     History  Chief Complaint  Patient presents with   Abdominal Pain    [name] is a 67 y.o. female past medical history significant for hepatitis, TIA, brain aneurysm, adjustment disorder, chronic pain syndrome, malignant carcinoid tumor of duodenum, previous Takotsubo cardiomyopathy presents with concern for left upper quadrant abdominal pain which is worsened from baseline to starting today.  She endorses some fatigue, nausea, vomiting, diarrhea, endorsed 3 episodes today.  She denies any fever, chest pain, shortness of breath.  Patient is very concerned by the appearance of her stool x 1 that she could have a parasitic infection, she has no known out of the country travels or exposure to parasites.   Abdominal Pain      Home Medications Prior to Admission medications   Medication Sig Start Date End Date Taking? Authorizing Provider  ALPRAZolam Prudy Feeler) 1 MG tablet Take 1 tablet (1 mg total) by mouth 2 (two) times daily as needed for anxiety. 06/04/15   Serena Croissant, MD  amLODipine (NORVASC) 5 MG tablet Take 5 mg by mouth daily. 07/04/22   [provider]  aspirin EC 81 MG tablet Take 81 mg by mouth daily.    [provider]  HYDROcodone-acetaminophen (NORCO) 5-325 MG tablet Take 1 tablet by mouth every 8 (eight) hours as needed for severe pain. Do take take when taking Xanax. Do not drive while taking. 08/14/22   Rana Snare, NP  lisinopril (ZESTRIL) 20 MG tablet Take 20 mg by mouth daily. Taking 30 mg 05/29/22   [provider]  metoprolol tartrate (LOPRESSOR) 50 MG tablet Take one tablet two hours prior to cardiac CTA. 12/22/21   Gaston Islam., NP      Allergies    Statins, Tramadol, Ace inhibitors, Prozac [fluoxetine], Wellbutrin [bupropion], Ciprofloxacin hcl, and Sertraline hcl    Review of Systems   Review of  Systems  Gastrointestinal:  Positive for abdominal pain.  All other systems reviewed and are negative.   Physical Exam Updated Vital Signs BP (!) 155/81 (BP Location: Right Arm)   Pulse 88   Temp 98.1 F (36.7 C)   Resp 20   Ht 5\' 6"  (1.676 m)   Wt 76.7 kg   SpO2 97%   BMI 27.28 kg/m  Physical Exam Vitals and nursing note reviewed.  Constitutional:      General: She is not in acute distress.    Appearance: Normal appearance.  HENT:     Head: Normocephalic and atraumatic.  Eyes:     General:        Right eye: No discharge.        Left eye: No discharge.  Cardiovascular:     Rate and Rhythm: Normal rate and regular rhythm.     Heart sounds: No murmur heard.    No friction rub. No gallop.  Pulmonary:     Effort: Pulmonary effort is normal.     Breath sounds: Normal breath sounds.  Abdominal:     General: Bowel sounds are normal.     Palpations: Abdomen is soft.     Comments: Patient with some focal tenderness in the left upper quadrant, no rebound, rigidity, guarding on my exam.  She has normal bowel sounds throughout.  Skin:    General: Skin is warm and dry.     Capillary Refill: Capillary refill takes  less than 2 seconds.  Neurological:     Mental Status: She is alert and oriented to person, place, and time.  Psychiatric:        Mood and Affect: Mood normal.        Behavior: Behavior normal.     ED Results / Procedures / Treatments   Labs (all labs ordered are listed, but only abnormal results are displayed) Labs Reviewed  OVA + PARASITE EXAM  LIPASE, BLOOD  COMPREHENSIVE METABOLIC PANEL  CBC WITH DIFFERENTIAL/PLATELET  URINALYSIS, ROUTINE W REFLEX MICROSCOPIC    EKG None  Radiology CT Abdomen Pelvis W Contrast  Result Date: 12/15/2022 CLINICAL DATA:  Left upper quadrant pain. EXAM: CT ABDOMEN AND PELVIS WITH CONTRAST TECHNIQUE: Multidetector CT imaging of the abdomen and pelvis was performed using the standard protocol following bolus administration  of intravenous contrast. RADIATION DOSE REDUCTION: This exam was performed according to the departmental dose-optimization program which includes automated exposure control, adjustment of the mA and/or kV according to patient size and/or use of iterative reconstruction technique. CONTRAST:  37mL OMNIPAQUE IOHEXOL 350 MG/ML SOLN COMPARISON:  August 16, 2022 FINDINGS: Lower chest: No acute abnormality. Hepatobiliary: A 3.5 cm x 3.0 cm x 3.8 cm well-defined low-attenuation lesion is seen within the anterolateral aspect of the right lobe of the liver. This is seen on the prior study (measured 3.1 cm x 3.1 cm on the prior exam). No gallstones, gallbladder wall thickening, or biliary dilatation. Pancreas: Unremarkable. No pancreatic ductal dilatation or surrounding inflammatory changes. Spleen: Normal in size without focal abnormality. Adrenals/Urinary Tract: Adrenal glands are unremarkable. Kidneys are normal, without renal calculi, focal lesion, or hydronephrosis. The urinary bladder is poorly distended and subsequently limited in evaluation. Stomach/Bowel: Stomach is within normal limits. Surgically anastomosed bowel is seen within the midline of the lower pelvis. The appendix is not identified. No evidence of bowel wall thickening, distention, or inflammatory changes. Vascular/Lymphatic: Aortic atherosclerosis. A predominantly stable 12 mm mesenteric lymph node is seen along the medial aspect of the right lower quadrant. Reproductive: Uterus and bilateral adnexa are unremarkable. Other: No abdominal wall hernia or abnormality. No abdominopelvic ascites. Musculoskeletal: No acute or significant osseous findings. IMPRESSION: 1. Findings consistent with the patient's known solitary metastasis within the right hepatic lobe, as confirmed on prior nuclear medicine PET/CT (May 11, 2021). 2. 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 3. Aortic atherosclerosis. Aortic Atherosclerosis (ICD10-I70.0).  Electronically Signed   By: Aram Candela M.D.   On: 12/15/2022 20:31    Procedures Procedures    Medications Ordered in ED Medications  ondansetron (ZOFRAN-ODT) disintegrating tablet 4 mg (4 mg Oral Given 12/15/22 1736)  HYDROcodone-acetaminophen (NORCO/VICODIN) 5-325 MG per tablet 1 tablet (1 tablet Oral Given 12/15/22 1736)  sodium chloride 0.9 % bolus 1,000 mL (1,000 mLs Intravenous New Bag/Given 12/15/22 2016)  iohexol (OMNIPAQUE) 350 MG/ML injection 75 mL (75 mLs Intravenous Contrast Given 12/15/22 2016)    ED Course/ Medical Decision Making/ A&P                             Medical Decision Making Amount and/or Complexity of Data Reviewed Labs: ordered.   This patient is a 67 y.o. female  who presents to the ED for concern of left upper quadrant abdominal pain, diarrhea, and abdominal swelling for 1 day.   Differential diagnoses prior to evaluation: The emergent differential diagnosis includes, but is not limited to,  The causes of generalized abdominal pain  include but are not limited to AAA, mesenteric ischemia, appendicitis, diverticulitis, DKA, gastritis, gastroenteritis, AMI, nephrolithiasis, pancreatitis, peritonitis, adrenal insufficiency,lead poisoning, iron toxicity, intestinal ischemia, constipation, UTI,SBO/LBO, splenic rupture, biliary disease, IBD, IBS, PUD, or hepatitis. This is not an exhaustive differential.   Past Medical History / Co-morbidities: hepatitis, TIA, brain aneurysm, adjustment disorder, chronic pain syndrome, malignant carcinoid tumor of duodenum, previous Takotsubo cardiomyopathy  Additional history: Chart reviewed. 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 The pertinent findings include: Patient is quite anxious with rapid, but not pressured speech.  She does have a small amount of abdominal swelling on  exam, she has no rebound, rigidity, guarding  Lab Tests/Imaging studies: I personally interpreted labs/imaging and the pertinent results include: CMP unremarkable, lipase normal, CBC with differential is unremarkable, she does have 0 on eosinophil counts, I have low clinical suspicion for a parasite ptosis, but I did tell patient that we can run an ova and parasite exam and she can check her results on her portal in 24 to 48 hours.  Independently interpreted CT abdomen pelvis with contrast which shows no evidence of acute intra-abdominal abnormality, she has findings consistent with solid cancer of the liver which has been previously noted I agree with the radiologist interpretation.   Medications: I ordered medication including fluids for possible mild dehydration secondary to her diarrhea.  I have reviewed the patients home medicines and have made adjustments as needed.   Disposition: After consideration of the diagnostic results and the patients response to treatment, I feel that patient's primary Cancer with a carcinoid tumor, discussed with patient that her diarrhea may be secondary to carcinoid tumor symptoms, versus infectious diarrhea, versus other, discussed with patient that I have very low clinical suspicion for para cytosis based on her lack of eosinophilia and unconvincing pictures on her cell phone of what looks to me like normal diarrhea no signs of acute or surgical finding on her emergency department evaluation today, discussed that she can increase fiber in her diet, use Imodium as needed, do not recommend any additional workup or treatment at this time.Marland Kitchen   emergency department workup does not suggest an emergent condition requiring admission or immediate intervention beyond what has been performed at this time. The plan is: as above. The patient is safe for discharge and has been instructed to return immediately for worsening symptoms, change in symptoms or any other  concerns.    Final Clinical Impression(s) / ED Diagnoses Final diagnoses:  Left upper quadrant abdominal pain  Diarrhea of presumed infectious origin    Rx / DC Orders ED Discharge Orders     None         [place] 12/15/22 2101    Linwood Dibbles, MD 12/16/22 1501

## 2022-12-15 NOTE — ED Provider Triage Note (Signed)
Emergency Medicine Provider Triage Evaluation Note  [name] , a 67 y.o. female  was evaluated in triage.  Pt complains of generalized abdominal pain and swelling for the past 3 days.  Patient states she has a history of stomach cancer and hepatitis.  Patient dates she has tried Tylenol to no relief.  Patient stated she has noted increased swelling in her abdomen and that her left upper quadrant sharp pains that do not radiate.  Patient states she is nauseous and having episodes of nonbloody emesis and not keeping food or fluids down.  Patient also states she is having nonbloody diarrhea as well but denies any recent hospitalizations or antibiotics.  Patient denied chest pain, shortness of breath, fever/chills, dysuria, hematuria  Review of Systems  Positive: See HPI Negative: See HPI  Physical Exam  BP (!) 155/81 (BP Location: Right Arm)   Pulse 88   Temp 98.1 F (36.7 C)   Resp 20   Ht 5\' 6"  (1.676 m)   Wt 76.7 kg   SpO2 97%   BMI 27.28 kg/m  Gen:   Awake, no distress   Resp:  Normal effort  MSK:   Moves extremities without difficulty  Other:  Left upper quad tender, no peritoneal signs noted, ascites noted, no overlying skin color changes  Medical Decision Making  Medically screening exam initiated at 5:33 PM.  Appropriate orders placed.  [name] was informed that the remainder of the evaluation will be completed by another provider, this initial triage assessment does not replace that evaluation, and the importance of remaining in the ED until their evaluation is complete.  Workup initiated, Norco given for pain relief along with Zofran, patient stable at this time   [name] 12/15/22 1736

## 2022-12-20 LAB — OVA + PARASITE EXAM

## 2022-12-20 LAB — O&P RESULT

## 2022-12-22 ENCOUNTER — Inpatient Hospital Stay: Payer: 59 | Attending: Oncology

## 2022-12-22 ENCOUNTER — Inpatient Hospital Stay (HOSPITAL_BASED_OUTPATIENT_CLINIC_OR_DEPARTMENT_OTHER): Payer: 59 | Admitting: Oncology

## 2022-12-22 VITALS — BP 150/70 | HR 71 | Temp 98.1°F | Resp 18 | Ht 66.0 in | Wt 166.0 lb

## 2022-12-22 DIAGNOSIS — C7B04 Secondary carcinoid tumors of peritoneum: Secondary | ICD-10-CM | POA: Insufficient documentation

## 2022-12-22 DIAGNOSIS — Z86000 Personal history of in-situ neoplasm of breast: Secondary | ICD-10-CM | POA: Diagnosis not present

## 2022-12-22 DIAGNOSIS — C7A010 Malignant carcinoid tumor of the duodenum: Secondary | ICD-10-CM | POA: Diagnosis present

## 2022-12-22 DIAGNOSIS — Z8673 Personal history of transient ischemic attack (TIA), and cerebral infarction without residual deficits: Secondary | ICD-10-CM | POA: Insufficient documentation

## 2022-12-22 DIAGNOSIS — C7B02 Secondary carcinoid tumors of liver: Secondary | ICD-10-CM | POA: Diagnosis not present

## 2022-12-22 DIAGNOSIS — Z8619 Personal history of other infectious and parasitic diseases: Secondary | ICD-10-CM | POA: Diagnosis not present

## 2022-12-22 DIAGNOSIS — I2510 Atherosclerotic heart disease of native coronary artery without angina pectoris: Secondary | ICD-10-CM | POA: Diagnosis not present

## 2022-12-22 NOTE — Progress Notes (Signed)
  [facility] OFFICE PROGRESS NOTE   Diagnosis: Carcinoid tumor  INTERVAL HISTORY:   [name] returns for scheduled visit.  She reports increased left abdominal pain for the past few weeks.  She had nausea/vomiting and diarrhea and presented to the emergency room 12/14/2021.  A CT abdomen/pelvis revealed no acute change.  She continues to have intermittent diarrhea and reports she had recent flushing episodes.  Objective:  Vital signs in last 24 hours:  Blood pressure (!) 150/70, pulse 71, temperature 98.1 F (36.7 C), temperature source Oral, resp. rate 18, height 5\' 6"  (1.676 m), weight 166 lb (75.3 kg), SpO2 99 %.   Resp: Distant breath sounds, no respiratory distress Cardio: Regular rate and rhythm, no murmur GI: Tender in the left upper abdomen, no mass, no hepatosplenomegaly, the abdomen is soft Vascular: No leg edema  Lab Results:  Lab Results  Component Value Date   WBC 9.4 12/15/2022   HGB 14.0 12/15/2022   HCT 44.1 12/15/2022   MCV 88.9 12/15/2022   PLT 238 12/15/2022   NEUTROABS 7.0 12/15/2022    CMP  Lab Results  Component Value Date   NA 141 12/15/2022   K 4.6 12/15/2022   CL 104 12/15/2022   CO2 25 12/15/2022   GLUCOSE 99 12/15/2022   BUN 9 12/15/2022   CREATININE 0.85 12/15/2022   CALCIUM 9.7 12/15/2022   PROT 7.1 12/15/2022   ALBUMIN 4.3 12/15/2022   AST 18 12/15/2022   ALT 17 12/15/2022   ALKPHOS 62 12/15/2022   BILITOT 0.5 12/15/2022   GFRNONAA >60 12/15/2022   GFRAA 107 10/21/2019    Medications: I have reviewed the patient's current medications.   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, 2/5 lymph nodes, tumor invaded through the muscularis into subserosal tissues,[id number] Isolated right liver lesion noted on multiple imaging studies dating back to 2016 CT abdomen/pelvis 03/11/2021-[facility], increased size of right hepatic  lesion measuring up to 3.4 cm MRI abdomen 03/25/2021-enhancing right hepatic lesion concerning for malignancy, 3.2 x 3.7 cm Ultrasound-guided biopsy of right liver lesion 04/19/2021-well differentiated neuroendocrine tumor [facility] 05/11/2021-intense activity associated with the right hepatic lobe metastasis, single 11 mm mesenteric nodule with intense radiotracer activity CT abdomen/pelvis 11/15/2021-stable right hepatic metastasis, stable mesenteric lymph node CT abdomen/pelvis 08/16/2022 no acute findings.  Stable right hepatic lobe mass.  Stable borderline enlarged lymph nodes in the small bowel mesentery CT abdomen/pelvis 12/15/2022-solitary right hepatic metastasis-measured slightly larger, stable right lower quadrant mesenteric nodular metastasis 2.  Left breast DCIS 2014 3.  Hypertension 4.  Tobacco use 5.  CAD/MI 6.  History of a CVA 7.  History of hepatitis C     Disposition: [name] has metastatic carcinoid tumor.  She has intermittent symptoms suggestive of carcinoid syndrome, but it is unclear whether she is symptomatic from the metastatic carcinoid disease.  I suspect the left upper quadrant pain is unrelated to the carcinoid tumor.  She is scheduled for further evaluation by Dr. Marca Ancona next week.  She will return for an office visit here in 2 weeks.  We discussed a trial of somatostatin lock therapy.  She does not wish to begin treatment for carcinoid disease at present.  Thornton Papas, MD  12/22/2022  8:53 AM

## 2022-12-25 ENCOUNTER — Ambulatory Visit: Payer: 59 | Admitting: Nurse Practitioner

## 2022-12-25 LAB — CHROMOGRANIN A: Chromogranin A (ng/mL): 165.2 ng/mL — ABNORMAL HIGH (ref 0.0–101.8)

## 2022-12-25 NOTE — Progress Notes (Deleted)
Office Visit    Patient Name: [name] Date of Encounter: 12/25/2022  Primary Care Provider:  Teena Irani, PA-C Primary Cardiologist:  Chilton Si, MD  Chief Complaint    67 year old female with a history of nonobstructive CAD, Takotsubo cardiomyopathy, hypertension, hyperlipidemia, TIA, breast cancer, chronic pain syndrome, brain aneurysm, malignant carcinoid tumor of the duodenum, anxiety, depression, and tobacco use who presents for follow-up related to CAD and hypertension.  Past Medical History    Past Medical History:  Diagnosis Date   Adjustment disorder with mixed anxiety and depressed mood 01/05/2013   Allergy    Anxiety    Arthritis    "knees,wrists" (07/31/2014)   Brain aneurysm 2012   2cms.   Breast cancer 06/05/2013   Breast disorder    lump in left breast   Carcinoid tumor    Complication of anesthesia    hallucinations , anxiety    Depression    H/O echocardiogram    not finding report in EPIC, pt. reports that it was before her appt. /w Dr. Eden Emms    Hepatitis B dx'd ~ 1980   Hepatitis C dx'd ~ 2000   "specialist says it cleared itself on it's own"   Hx of radiation therapy 07/17/13-09/03/13   left breast,60.4Gy, 32/33 fx completed   Hyperlipidemia    Hypertension    "never took my RX" (07/31/2014)   Jaundice    Malignant neoplasm of lower-outer quadrant of female breast 05/19/2013   Pain management    goes to [facility] clinic, but she doesn't intend to return    Personal history of radiation therapy    2014   Pure hypercholesterolemia 10/23/2019   Stroke 2012   TIA   TIA (transient ischemic attack) 06/2011   "can't see real well out of left eye since; weaker on left side since"   Varicose veins    Walking pneumonia ~ 2008   Past Surgical History:  Procedure Laterality Date   BREAST BIOPSY Left 03/2013; 05/2013   BREAST LUMPECTOMY Left 04/02/2013   Procedure: Excision  of Left Breast Mass;  Surgeon: Shelly Rubenstein, MD;  Location: [facility]  OR;  Service: General;  Laterality: Left;   BREAST LUMPECTOMY WITH SENTINEL LYMPH NODE BIOPSY Left 06/05/2013   Procedure: BREAST LUMPECTOMY WITH SENTINEL LYMPH NODE BX;  Surgeon: Shelly Rubenstein, MD;  Location: [facility] OR;  Service: General;  Laterality: Left;   CARDIAC CATHETERIZATION  12/24/2017   COLON SURGERY  03/2013   bowel obstruction , malignant tumor     DILATION AND CURETTAGE OF UTERUS  ~ 1990   S/P miscarriage   EVACUATION BREAST HEMATOMA Left 06/05/2013   Procedure: EVACUATION HEMATOMA BREAST;  Surgeon: Shelly Rubenstein, MD;  Location: [facility] OR;  Service: General;  Laterality: Left;   LAPAROSCOPY N/A 04/02/2013   Procedure: Diaganostic Lap.; Small Bowel Resection;  Surgeon: Shelly Rubenstein, MD;  Location: [facility] OR;  Service: General;  Laterality: N/A;   LEFT HEART CATH AND CORONARY ANGIOGRAPHY N/A 12/24/2017   Procedure: LEFT HEART CATH AND CORONARY ANGIOGRAPHY;  Surgeon: Corky Crafts, MD;  Location: [facility] INVASIVE CV LAB;  Service: Cardiovascular;  Laterality: N/A;   TUMOR EXCISION  04/02/2013   "carcoid tumor removed"   vaginal births   1975    birthed twins at -[redacted] weeks gestation - both survived , total of 3 pregnancies - all born vaginally    Allergies  Allergies  Allergen Reactions   Statins Swelling and Other (See Comments)  Stomach swelling, muscle aches, and abdominal pain    Tramadol Anxiety and Other (See Comments)    Extreme muscle pain and agitation     Ace Inhibitors Other (See Comments)    Palpitations   Prozac [Fluoxetine] Other (See Comments)    Causes the patient to be JITTERY and WEEPY   Wellbutrin [Bupropion]     Agitation, insomnia   Ciprofloxacin Hcl Palpitations   Sertraline Hcl Diarrhea     Labs/Other Studies Reviewed    The following studies were reviewed today: Echo 06/2022: IMPRESSIONS     1. Left ventricular ejection fraction, by estimation, is 55 to 60%. The  left ventricle has normal function. The left ventricle has no regional   wall motion abnormalities. Left ventricular diastolic parameters are  consistent with Grade I diastolic  dysfunction (impaired relaxation). The average left ventricular global  longitudinal strain is -16.2 %. The global longitudinal strain is  abnormal.   2. Right ventricular systolic function is normal. The right ventricular  size is normal. Tricuspid regurgitation signal is inadequate for assessing  PA pressure.   3. Left atrial size was mildly dilated.   4. The mitral valve is degenerative. Mild mitral valve regurgitation.  Moderate mitral annular calcification.   5. The aortic valve is tricuspid. There is moderate calcification of the  aortic valve. There is moderate thickening of the aortic valve. Aortic  valve regurgitation is mild. Aortic valve sclerosis/calcification is  present, without any evidence of aortic  stenosis.   6. Aortic dilatation noted. There is borderline dilatation of the aortic  root, measuring 36 mm.   7. The inferior vena cava is normal in size with greater than 50%  respiratory variability, suggesting right atrial pressure of 3 mmHg.   Comparison(s): Compared to prior TTE in 2019, there is no significant  change.   CCTA 01/2022:   IMPRESSION: 1. Minimal CAD, CADRADS = 1.   2. Coronary calcium score of 0.   3. Normal coronary origins with right dominance.   4. Severe left atrial enlargement, mild right atrial enlargement.   Recent Labs: 11/25/2022: B Natriuretic Peptide 135.7 12/15/2022: ALT 17; BUN 9; Creatinine, Ser 0.85; Hemoglobin 14.0; Platelets 238; Potassium 4.6; Sodium 141  Recent Lipid Panel    Component Value Date/Time   CHOL 227 (H) 02/22/2021 0000   TRIG 151 (H) 02/22/2021 0000   HDL 66 02/22/2021 0000   CHOLHDL 3.4 02/22/2021 0000   CHOLHDL 2.6 12/24/2017 0953   VLDL 26 12/24/2017 0953   LDLCALC 134 (H) 02/22/2021 0000    History of Present Illness    67 year old female with the above past medical history including  nonobstructive CAD, Takotsubo cardiomyopathy, hypertension, hyperlipidemia, TIA, breast cancer, chronic pain syndrome, brain aneurysm, malignant carcinoid tumor of the duodenum, anxiety, depression, and tobacco use.  She was diagnosed with Takotsubo cardiomyopathy in January 2019.  Echo showed EF 50 to 55%, G1 DD, mid apical inferior hypokinesis.  Cardiac catheterization revealed but 10% RCA stenosis, EF 25 to 30%.  Repeat echo in July 2019 showed EF 55 to 60%, normal wall motion, G1 DD, mild AI.  Coronary CTA in 01/2022 showed minimal CAD with less than 25% stenosis of the RCA, severe left atrial lodgment, mild right atrial enlargement.  She was last seen in the office on 06/01/2022 and was stable overall from a cardiac standpoint.  Her BP was elevated and she reported feeling generally not well.  She also reported constant chest pressure on her to her left breast,  tender to palpation.  She denied exertional symptoms. She was referred to lipid clinic Pharm.[facility]. for consideration of PCSK9 inhibitor given history of statin intolerance.  She presented to the ED on 11/25/2022 with complaints of chest pain and palpitations.  EKG showed sinus rhythm, BNP was minimally elevated, troponin was negative x 2.  Chest x-ray was unremarkable.  She was discharged home in stable condition.  She returned to the ED on 12/15/2022 with complaints of abdominal pain.  CT of the abdomen/pelvis was unchanged from prior and showed stable no solitary metastasis within the right hepatic lobe, stable right lower quadrant mesenteric nodular metastasis.  Labs from [place].  She was discharged home in stable condition  She presents today for follow-up.  Since her last visit she has  CAD: History of Takotsubo cardiomyopathy: Hypertension: Hyperlipidemia: History of TIA: Tobacco use: Disposition:   Home Medications    Current Outpatient Medications  Medication Sig Dispense Refill   ALPRAZolam (XANAX) 1 MG tablet Take 1 tablet (1 mg  total) by mouth 2 (two) times daily as needed for anxiety. 45 tablet 0   aspirin EC 81 MG tablet Take 81 mg by mouth daily.     lisinopril (ZESTRIL) 20 MG tablet Take 20 mg by mouth daily. Taking 30 mg     No current facility-administered medications for this visit.     Review of Systems    ***.  All other systems reviewed and are otherwise negative except as noted above.    Physical Exam    VS:  There were no vitals taken for this visit. , BMI There is no height or weight on file to calculate BMI.     GEN: Well nourished, well developed, in no acute distress. HEENT: normal. Neck: Supple, no JVD, carotid bruits, or masses. Cardiac: RRR, no murmurs, rubs, or gallops. No clubbing, cyanosis, edema.  Radials/DP/PT 2+ and equal bilaterally.  Respiratory:  Respirations regular and unlabored, clear to auscultation bilaterally. GI: Soft, nontender, nondistended, BS + x 4. MS: no deformity or atrophy. Skin: warm and dry, no rash. Neuro:  Strength and sensation are intact. Psych: Normal affect.  Accessory Clinical Findings    ECG personally reviewed by me today - *** - no acute changes.   Lab Results  Component Value Date   WBC 9.4 12/15/2022   HGB 14.0 12/15/2022   HCT 44.1 12/15/2022   MCV 88.9 12/15/2022   PLT 238 12/15/2022   Lab Results  Component Value Date   CREATININE 0.85 12/15/2022   BUN 9 12/15/2022   NA 141 12/15/2022   K 4.6 12/15/2022   CL 104 12/15/2022   CO2 25 12/15/2022   Lab Results  Component Value Date   ALT 17 12/15/2022   AST 18 12/15/2022   ALKPHOS 62 12/15/2022   BILITOT 0.5 12/15/2022   Lab Results  Component Value Date   CHOL 227 (H) 02/22/2021   HDL 66 02/22/2021   LDLCALC 134 (H) 02/22/2021   TRIG 151 (H) 02/22/2021   CHOLHDL 3.4 02/22/2021    Lab Results  Component Value Date   HGBA1C 5.2 12/24/2017    Assessment & Plan    1.  ***  No BP recorded.  {Refresh Note OR Click here to enter BP  :1}***   Joylene Grapes,  NP 12/25/2022, 5:44 AM

## 2023-01-08 ENCOUNTER — Encounter: Payer: Self-pay | Admitting: Nurse Practitioner

## 2023-01-08 ENCOUNTER — Inpatient Hospital Stay (HOSPITAL_BASED_OUTPATIENT_CLINIC_OR_DEPARTMENT_OTHER): Payer: 59 | Admitting: Nurse Practitioner

## 2023-01-08 VITALS — BP 161/84 | HR 87 | Temp 98.1°F | Resp 18 | Ht 66.0 in | Wt 158.8 lb

## 2023-01-08 DIAGNOSIS — C7A010 Malignant carcinoid tumor of the duodenum: Secondary | ICD-10-CM | POA: Diagnosis not present

## 2023-01-08 DIAGNOSIS — C7B02 Secondary carcinoid tumors of liver: Secondary | ICD-10-CM | POA: Diagnosis not present

## 2023-01-08 NOTE — Progress Notes (Signed)
  [facility] OFFICE PROGRESS NOTE   Diagnosis: Carcinoid tumor  INTERVAL HISTORY:   [name] returns as scheduled.  The chromogranin A level was stable on 12/22/2022 (165).  She underwent a colonoscopy and upper endoscopy on 12/27/2022.  She reports she is taking 2 antibiotics and another medication.  She began these yesterday.  She continues to have intermittent pain at the left upper abdomen.  No diarrhea.  She woke up a few nights ago "feeling hot".  She is trying to quit smoking.  Objective:  Vital signs in last 24 hours:  Blood pressure (!) 161/84, pulse 87, temperature 98.1 F (36.7 C), temperature source Oral, resp. rate 18, height 5\' 6"  (1.676 m), weight 158 lb 12.8 oz (72 kg), SpO2 98 %.    HEENT: No thrush or ulcers. Resp: Distant breath sounds.  No respiratory distress. Cardio: Regular rate and rhythm. GI: Abdomen soft.  Tender at the left upper abdomen.  No hepatosplenomegaly.  No mass. Vascular: No leg edema.   Lab Results:  Lab Results  Component Value Date   WBC 9.4 12/15/2022   HGB 14.0 12/15/2022   HCT 44.1 12/15/2022   MCV 88.9 12/15/2022   PLT 238 12/15/2022   NEUTROABS 7.0 12/15/2022    Imaging:  No results found.  Medications: I have reviewed the patient's current medications.  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, 2/5 lymph nodes, tumor invaded through the muscularis into subserosal tissues,pT3pN1 Isolated right liver lesion noted on multiple imaging studies dating back to 2016 CT abdomen/pelvis 03/11/2021-[facility], increased size of right hepatic lesion measuring up to 3.4 cm MRI abdomen 03/25/2021-enhancing right hepatic lesion concerning for malignancy, 3.2 x 3.7 cm Ultrasound-guided biopsy of right liver lesion 04/19/2021-well differentiated neuroendocrine tumor Netspot 05/11/2021-intense activity associated with the right  hepatic lobe metastasis, single 11 mm mesenteric nodule with intense radiotracer activity CT abdomen/pelvis 11/15/2021-stable right hepatic metastasis, stable mesenteric lymph node CT abdomen/pelvis 08/16/2022 no acute findings.  Stable right hepatic lobe mass.  Stable borderline enlarged lymph nodes in the small bowel mesentery CT abdomen/pelvis 12/15/2022-solitary right hepatic metastasis-measured slightly larger, stable right lower quadrant mesenteric nodular metastasis 2.  Left breast DCIS 2014 3.  Hypertension 4.  Tobacco use 5.  CAD/MI 6.  History of a CVA 7.  History of hepatitis C 8.  Colonoscopy 12/27/2022-two 4 mm polyps in the ascending colon, five 3 to 6 mm polyps in the sigmoid colon, descending colon and rectum 9.  Upper endoscopy 12/27/2022-normal esophagus, 3 cm hiatal hernia, erythematous mucosa in the gastric antrum and gastric body, nodular mucosa in the first portion of the duodenum  Disposition: [name] appears stable.  She has metastatic carcinoid tumor.  She has occasional diarrhea and potential flushing episodes.  Unclear if the symptoms are related to carcinoid syndrome.  She has persistent intermittent left upper quadrant pain.  She underwent GI evaluation by Dr. Marca Ancona 2 weeks ago.  She had an upper endoscopy and colonoscopy.  We will call for the pathology reports.  She began taking amoxicillin, clarithromycin and Protonix yesterday.  She will return for a chromogranin A level and follow-up visit in 3 months.  She will contact the office in the interim with any problems.    Lonna Cobb ANP/GNP-BC   01/08/2023  8:55 AM

## 2023-01-23 ENCOUNTER — Encounter: Payer: Self-pay | Admitting: Oncology

## 2023-01-24 NOTE — Progress Notes (Signed)
Patient had viral swab performed as she presented with cough to evaluate for possible cause of her symptoms.

## 2023-03-07 ENCOUNTER — Other Ambulatory Visit: Payer: Self-pay | Admitting: Oncology

## 2023-03-07 DIAGNOSIS — Z1231 Encounter for screening mammogram for malignant neoplasm of breast: Secondary | ICD-10-CM

## 2023-04-02 ENCOUNTER — Ambulatory Visit: Payer: 59 | Admitting: Oncology

## 2023-04-02 ENCOUNTER — Other Ambulatory Visit: Payer: 59

## 2023-04-09 ENCOUNTER — Inpatient Hospital Stay: Payer: 59 | Admitting: Oncology

## 2023-04-09 ENCOUNTER — Inpatient Hospital Stay: Payer: 59 | Attending: Oncology

## 2023-04-09 VITALS — BP 153/82 | HR 68 | Temp 98.1°F | Resp 20 | Ht 66.0 in | Wt 165.2 lb

## 2023-04-09 DIAGNOSIS — I10 Essential (primary) hypertension: Secondary | ICD-10-CM | POA: Insufficient documentation

## 2023-04-09 DIAGNOSIS — C7B02 Secondary carcinoid tumors of liver: Secondary | ICD-10-CM

## 2023-04-09 DIAGNOSIS — C7A012 Malignant carcinoid tumor of the ileum: Secondary | ICD-10-CM | POA: Insufficient documentation

## 2023-04-09 DIAGNOSIS — Z8619 Personal history of other infectious and parasitic diseases: Secondary | ICD-10-CM | POA: Diagnosis not present

## 2023-04-09 DIAGNOSIS — Z86000 Personal history of in-situ neoplasm of breast: Secondary | ICD-10-CM | POA: Diagnosis not present

## 2023-04-09 DIAGNOSIS — F17210 Nicotine dependence, cigarettes, uncomplicated: Secondary | ICD-10-CM | POA: Insufficient documentation

## 2023-04-09 DIAGNOSIS — Z8673 Personal history of transient ischemic attack (TIA), and cerebral infarction without residual deficits: Secondary | ICD-10-CM | POA: Diagnosis not present

## 2023-04-09 DIAGNOSIS — C7A010 Malignant carcinoid tumor of the duodenum: Secondary | ICD-10-CM | POA: Diagnosis present

## 2023-04-09 NOTE — Progress Notes (Signed)
[facility] OFFICE PROGRESS NOTE   Diagnosis: Carcinoid tumor  INTERVAL HISTORY:   Ms. Kult returns as scheduled.  She reports improvement in left subcostal discomfort after treatment for H. pylori.  No diarrhea.  Her face is intermittently "red ", but no flushing spells.  She continues smoking.  Objective:  Vital signs in last 24 hours:  Blood pressure (!) 153/82, pulse 68, temperature 98.1 F (36.7 C), temperature source Oral, resp. rate 20, height 5\' 6"  (1.676 m), weight 165 lb 3.2 oz (74.9 kg), SpO2 98%.     Lymphatics: No cervical, supraclavicular, axillary, or inguinal nodes Resp: Lungs clear bilaterally Cardio: Regular rate and rhythm GI: No hepatosplenomegaly, nontender, no mass Vascular: No leg edema  Lab Results:  Lab Results  Component Value Date   WBC 9.4 12/15/2022   HGB 14.0 12/15/2022   HCT 44.1 12/15/2022   MCV 88.9 12/15/2022   PLT 238 12/15/2022   NEUTROABS 7.0 12/15/2022    CMP  Lab Results  Component Value Date   NA 141 12/15/2022   K 4.6 12/15/2022   CL 104 12/15/2022   CO2 25 12/15/2022   GLUCOSE 99 12/15/2022   BUN 9 12/15/2022   CREATININE 0.85 12/15/2022   CALCIUM 9.7 12/15/2022   PROT 7.1 12/15/2022   ALBUMIN 4.3 12/15/2022   AST 18 12/15/2022   ALT 17 12/15/2022   ALKPHOS 62 12/15/2022   BILITOT 0.5 12/15/2022   GFRNONAA >60 12/15/2022   GFRAA 107 10/21/2019    No results found for: "CEA1", "CEA", "HQI696", "CA125"  Lab Results  Component Value Date   INR 0.9 04/19/2021   LABPROT 12.0 04/19/2021    Imaging:  No results found.  Medications: I have reviewed the patient's current medications.   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, 2/5 lymph nodes, tumor invaded through the muscularis into subserosal tissues,pT3pN1 Isolated right liver lesion noted on multiple imaging studies dating back to  2016 CT abdomen/pelvis 03/11/2021-[facility], increased size of right hepatic lesion measuring up to 3.4 cm MRI abdomen 03/25/2021-enhancing right hepatic lesion concerning for malignancy, 3.2 x 3.7 cm Ultrasound-guided biopsy of right liver lesion 04/19/2021-well differentiated neuroendocrine tumor Netspot 05/11/2021-intense activity associated with the right hepatic lobe metastasis, single 11 mm mesenteric nodule with intense radiotracer activity CT abdomen/pelvis 11/15/2021-stable right hepatic metastasis, stable mesenteric lymph node CT abdomen/pelvis 08/16/2022 no acute findings.  Stable right hepatic lobe mass.  Stable borderline enlarged lymph nodes in the small bowel mesentery CT abdomen/pelvis 12/15/2022-solitary right hepatic metastasis-measured slightly larger, stable right lower quadrant mesenteric nodular metastasis 2.  Left breast DCIS 2014 3.  Hypertension 4.  Tobacco use 5.  CAD/MI 6.  History of a CVA 7.  History of hepatitis C 8.  Colonoscopy 12/27/2022-two 4 mm polyps in the ascending colon, five 3 to 6 mm polyps in the sigmoid colon, descending colon and rectum-tubular adenoma and hyperplastic polyps 9.  Upper endoscopy 12/27/2022-normal esophagus, 3 cm hiatal hernia, erythematous mucosa in the gastric antrum and gastric body, nodular mucosa in the first portion of the duodenum-active H. pylori on the gastric biopsy-treated    Disposition: Ms. Anstey appears stable.  There is no clinical evidence for progression of the carcinoid disease.  We will follow-up on the chromogranin A level from today.  The plan is to continue observation.  She will be scheduled for a restaging dotatate PET and office visit in approximately 5 months.  Thornton Papas,  MD  04/09/2023  10:33 AM

## 2023-04-10 ENCOUNTER — Ambulatory Visit
Admission: RE | Admit: 2023-04-10 | Discharge: 2023-04-10 | Disposition: A | Discharge status: Home / Self Care | Payer: 59 | Source: Ambulatory Visit | Attending: Oncology | Admitting: Oncology

## 2023-04-10 DIAGNOSIS — Z1231 Encounter for screening mammogram for malignant neoplasm of breast: Secondary | ICD-10-CM

## 2023-07-27 ENCOUNTER — Ambulatory Visit (HOSPITAL_BASED_OUTPATIENT_CLINIC_OR_DEPARTMENT_OTHER): Payer: 59 | Admitting: Family

## 2023-07-27 ENCOUNTER — Encounter (HOSPITAL_BASED_OUTPATIENT_CLINIC_OR_DEPARTMENT_OTHER): Payer: Self-pay | Admitting: Family

## 2023-07-27 VITALS — BP 130/76 | HR 76 | Ht 66.0 in | Wt 162.5 lb

## 2023-07-27 DIAGNOSIS — E785 Hyperlipidemia, unspecified: Secondary | ICD-10-CM

## 2023-07-27 DIAGNOSIS — R002 Palpitations: Secondary | ICD-10-CM | POA: Diagnosis not present

## 2023-07-27 DIAGNOSIS — I25118 Atherosclerotic heart disease of native coronary artery with other forms of angina pectoris: Secondary | ICD-10-CM | POA: Diagnosis not present

## 2023-07-27 DIAGNOSIS — R079 Chest pain, unspecified: Secondary | ICD-10-CM | POA: Diagnosis not present

## 2023-07-27 DIAGNOSIS — Z720 Tobacco use: Secondary | ICD-10-CM

## 2023-07-27 DIAGNOSIS — I5181 Takotsubo syndrome: Secondary | ICD-10-CM

## 2023-07-27 NOTE — Patient Instructions (Signed)
Medication Instructions:  Your physician recommends that you continue on your current medications as directed. Please refer to the Current Medication list given to you today.  *If you need a refill on your cardiac medications before your next appointment, please call your pharmacy*  Follow-Up: At [facility], you and your health needs are our priority.  As part of our continuing mission to provide you with exceptional heart care, we have created designated Provider Care Teams.  These Care Teams include your primary Cardiologist (physician) and Advanced Practice Providers (APPs -  Physician Assistants and Nurse Practitioners) who all work together to provide you with the care you need, when you need it.  We recommend signing up for the patient portal called "MyChart".  Sign up information is provided on this After Visit Summary.  MyChart is used to connect with patients for Virtual Visits (Telemedicine).  Patients are able to view lab/test results, encounter notes, upcoming appointments, etc.  Non-urgent messages can be sent to your provider as well.   To learn more about what you can do with MyChart, go to [email or web address].    Your next appointment:   3 month(s)  Provider:   Chilton Si, MD or Gillian Shields, NP    Other Instructions  MyChart username: [email or web address]  Password was reset today  Pursed Lip Breathing Pursed lip breathing is a technique to relieve the feeling of being short of breath.  Being short of breath can make you tense and anxious. Before you start this breathing exercise, take a minute to relax your shoulders and close your eyes. Then: Start the exercise by closing your mouth. Breathe in through your nose, taking a normal breath. You can do this at your normal rate of breathing. If you feel you are not getting enough air, breathe in while slowly counting to 2 or 3. Pucker (purse) your lips as if you were going to whistle. Gently tighten the  muscles of your abdomenor press on your abdomen to help push the air out. Breathe out slowly through your pursed lips. Take at least twice as long to breathe out as it takes you to breathe in. Make sure that you breathe out all of the air, but do not force air out.   To prevent palpitations: Make sure you are adequately hydrated.  Avoid and/or limit caffeine containing beverages like soda or tea. Exercise regularly.  Manage stress well. Some over the counter medications can cause palpitations such as Benadryl, AdvilPM, TylenolPM. Regular Advil or Tylenol do not cause palpitations.
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
## 2023-07-31 ENCOUNTER — Telehealth: Payer: Self-pay

## 2023-07-31 DIAGNOSIS — Z0000 Encounter for general adult medical examination without abnormal findings: Secondary | ICD-10-CM

## 2023-07-31 NOTE — Telephone Encounter (Signed)
Called patient per health coaching referral for smoking cessation from Gillian Shields, NP. Patient did not answer. Left message for patient to return call.   Renaee Munda, MS, ERHD, [facility]  Care Guide, Health & Wellness Coach [address], [address] [place] [address] Telephone: [phone] Email: [email or web address]

## 2023-08-01 NOTE — Telephone Encounter (Signed)
Telephone call  

## 2023-08-17 ENCOUNTER — Telehealth: Payer: Self-pay

## 2023-08-17 DIAGNOSIS — Z0000 Encounter for general adult medical examination without abnormal findings: Secondary | ICD-10-CM

## 2023-08-17 NOTE — Telephone Encounter (Signed)
Called patient per health coaching referral for smoking cessation. Patient did not answer. Left message for patient to return call.    Renaee Munda, MS, ERHD, [facility]  Care Guide, Health & Wellness Coach [address], [address] [place] [address] Telephone: [phone] Email: [email or web address]

## 2023-09-07 ENCOUNTER — Encounter (HOSPITAL_COMMUNITY)
Admission: RE | Admit: 2023-09-07 | Discharge: 2023-09-07 | Disposition: A | Discharge status: Home / Self Care | Payer: 59 | Source: Ambulatory Visit | Attending: Oncology | Admitting: Oncology

## 2023-09-07 DIAGNOSIS — C7B02 Secondary carcinoid tumors of liver: Secondary | ICD-10-CM | POA: Insufficient documentation

## 2023-09-07 MED ORDER — COPPER CU 64 DOTATATE 1 MCI/ML IV SOLN
4.0000 | Freq: Once | INTRAVENOUS | Status: AC
  Administered 2023-09-07: 4.159 via INTRAVENOUS

## 2023-09-10 ENCOUNTER — Other Ambulatory Visit: Payer: 59

## 2023-09-17 ENCOUNTER — Other Ambulatory Visit: Payer: Self-pay | Admitting: Pharmacist Clinician (PhC)/ Clinical Pharmacy Specialist

## 2023-09-17 ENCOUNTER — Inpatient Hospital Stay: Payer: 59

## 2023-09-17 ENCOUNTER — Other Ambulatory Visit: Payer: 59

## 2023-09-17 ENCOUNTER — Inpatient Hospital Stay: Payer: 59 | Attending: Oncology | Admitting: Oncology

## 2023-09-17 VITALS — BP 144/89 | HR 84 | Temp 98.3°F | Resp 18 | Ht 66.0 in | Wt 161.3 lb

## 2023-09-17 DIAGNOSIS — Z86000 Personal history of in-situ neoplasm of breast: Secondary | ICD-10-CM | POA: Insufficient documentation

## 2023-09-17 DIAGNOSIS — Z8619 Personal history of other infectious and parasitic diseases: Secondary | ICD-10-CM | POA: Diagnosis not present

## 2023-09-17 DIAGNOSIS — C7B02 Secondary carcinoid tumors of liver: Secondary | ICD-10-CM | POA: Diagnosis not present

## 2023-09-17 DIAGNOSIS — Z8673 Personal history of transient ischemic attack (TIA), and cerebral infarction without residual deficits: Secondary | ICD-10-CM | POA: Diagnosis not present

## 2023-09-17 DIAGNOSIS — C7A010 Malignant carcinoid tumor of the duodenum: Secondary | ICD-10-CM | POA: Insufficient documentation

## 2023-09-17 LAB — CMP (CANCER CENTER ONLY)
ALT: 16 U/L (ref 0–44)
AST: 15 U/L (ref 15–41)
Albumin: 4.3 g/dL (ref 3.5–5.0)
Alkaline Phosphatase: 62 U/L (ref 38–126)
Anion gap: 6 (ref 5–15)
BUN: 11 mg/dL (ref 8–23)
CO2: 29 mmol/L (ref 22–32)
Calcium: 9.3 mg/dL (ref 8.9–10.3)
Chloride: 106 mmol/L (ref 98–111)
Creatinine: 0.64 mg/dL (ref 0.44–1.00)
GFR, Estimated: >60 mL/min (ref >60)
Glucose, Bld: 101 mg/dL — ABNORMAL HIGH (ref 70–99)
Potassium: 4.1 mmol/L (ref 3.5–5.1)
Sodium: 141 mmol/L (ref 135–145)
Total Bilirubin: 0.5 mg/dL (ref 0.0–1.2)
Total Protein: 7.2 g/dL (ref 6.5–8.1)

## 2023-09-17 NOTE — Progress Notes (Signed)
 [facility] OFFICE PROGRESS NOTE   Diagnosis: Carcinoid tumor  INTERVAL HISTORY:   [name] returns as scheduled.  No fever, flushing, or consistent diarrhea.  She reports diarrhea approximately once per week.  She complains of constant discomfort at the left mid anterior costal margin.  There is tenderness in this area.  She reports this discomfort has been present for approximately 10 years.  She believes the pain started after completing left breast radiation.  A mammogram 04/10/2023 was negative.    Objective:  Vital signs in last 24 hours:  Blood pressure (!) 144/89, pulse 84, temperature 98.3 F (36.8 C), temperature source Temporal, resp. rate 18, height 5' 6 (1.676 m), weight 161 lb 4.8 oz (73.2 kg), SpO2 98%.    Lymphatics: No cervical, supraclavicular, axillary, or inguinal nodes Resp: Lungs clear bilaterally Cardio: Rate and rhythm GI: No hepatosplenomegaly, no mass, nontender Vascular: No leg edema Breast: Left lumpectomy.  Firm tissue surrounding the left subareolar lumpectomy scar.  Left breast without mass. Musculoskeletal: Tender at the mid aspect of the left anterior costal margin.  No mass.  Lab Results:  Lab Results  Component Value Date   WBC 9.4 12/15/2022   HGB 14.0 12/15/2022   HCT 44.1 12/15/2022   MCV 88.9 12/15/2022   PLT 238 12/15/2022   NEUTROABS 7.0 12/15/2022    CMP  Lab Results  Component Value Date   NA 141 09/17/2023   K 4.1 09/17/2023   CL 106 09/17/2023   CO2 29 09/17/2023   GLUCOSE 101 (H) 09/17/2023   BUN 11 09/17/2023   CREATININE 0.64 09/17/2023   CALCIUM 9.3 09/17/2023   PROT 7.2 09/17/2023   ALBUMIN 4.3 09/17/2023   AST 15 09/17/2023   ALT 16 09/17/2023   ALKPHOS 62 09/17/2023   BILITOT 0.5 09/17/2023   GFRNONAA >60 09/17/2023   GFRAA 107 10/21/2019     Medications: I have reviewed the patient's current medications.   Assessment/Plan: Metastatic carcinoid tumor Small bowel obstruction July  2014, small bowel resection, ileum-2 cm carcinoid tumor of the duodenum, 2/5 lymph nodes, tumor invaded through the muscularis into subserosal tissues,pT3pN1 Isolated right liver lesion noted on multiple imaging studies dating back to 2016 CT abdomen/pelvis 03/11/2021-[facility], increased size of right hepatic lesion measuring up to 3.4 cm MRI abdomen 03/25/2021-enhancing right hepatic lesion concerning for malignancy, 3.2 x 3.7 cm Ultrasound-guided biopsy of right liver lesion 04/19/2021-well differentiated neuroendocrine tumor [facility]  05/11/2021-intense activity associated with the right hepatic lobe metastasis, single 11 mm mesenteric nodule with intense radiotracer activity CT abdomen/pelvis 11/15/2021-stable right hepatic metastasis, stable mesenteric lymph node CT abdomen/pelvis 08/16/2022 no acute findings.  Stable right hepatic lobe mass.  Stable borderline enlarged lymph nodes in the small bowel mesentery CT abdomen/pelvis 12/15/2022-solitary right hepatic metastasis-measured slightly larger, stable right lower quadrant mesenteric nodular metastasis Dotatate PET 09/07/2023: Slight enlargement of the right liver metastasis, no new liver lesion, unchanged small bowel mesenteric nodule, single focus of radiotracer activity at the small bowel at the ventral peritoneum beneath the right rectus muscle with no CT correlate no focal activity in the skeleton 2.  Left breast DCIS 2014 3.  Hypertension 4.  Tobacco use 5.  CAD/MI 6.  History of a CVA 7.  History of hepatitis C 8.  Colonoscopy 12/27/2022-two 4 mm polyps in the ascending colon, five 3 to 6 mm polyps in the sigmoid colon, descending colon and rectum-tubular adenoma and hyperplastic polyps 9.  Upper endoscopy 12/27/2022-normal esophagus, 3 cm hiatal hernia, erythematous mucosa  in the gastric antrum and gastric body, nodular mucosa in the first portion of the duodenum-active H. pylori on the gastric biopsy-treated     Disposition: [name] has  metastatic carcinoid tumor.  The restaging dotatate scan reveals slight enlargement of the right liver lesion compared to the scan from 2022.  There is a stable mesenteric nodule and a potential new small bowel lesion deep to the right rectus muscle.  She appears asymptomatic from the carcinoid tumor.  The plan is to continue observation.  We will follow-up on the chromogranin a level from today.  She will return for an office visit and chromogranin A level in 6 months.  The etiology of the left costal margin discomfort is unclear.  She reports this has been present for years.  I doubt this is related to the carcinoid tumor.  She could have costochondritis, rib injury from left breast radiation, or another benign musculoskeletal condition.  I offered to refer her to radiation oncology.  She does not wish to schedule an appointment at present.  She takes Tylenol  as needed for the chest discomfort.    Arley Hof, MD  09/17/2023  12:18 PM

## 2023-09-17 NOTE — Progress Notes (Signed)
Leqvio ordered.

## 2023-09-18 ENCOUNTER — Telehealth: Payer: Self-pay

## 2023-09-18 DIAGNOSIS — Z0000 Encounter for general adult medical examination without abnormal findings: Secondary | ICD-10-CM

## 2023-09-18 NOTE — Telephone Encounter (Signed)
 Called patient per health coaching referral for smoking cessation. Patient did not answer. Left message for patient to return call to discuss interest regarding participating in the program.   Greig Ruth, MS, ERHD, [facility]  Care Guide, Health & Wellness Coach [address] [place] [address] Telephone: [phone] Email: [email or web address]

## 2023-09-19 ENCOUNTER — Telehealth: Payer: Self-pay | Admitting: Pharmacy Technician

## 2023-09-19 ENCOUNTER — Telehealth: Payer: Self-pay

## 2023-09-19 LAB — CHROMOGRANIN A: Chromogranin A (ng/mL): 166.4 ng/mL — ABNORMAL HIGH (ref 0.0–101.8)

## 2023-09-19 NOTE — Telephone Encounter (Signed)
 [name] has been denied due to patient has not tried or failed preferred medications. Preferred medications are; Repatha Praluent   [email or web address]  - please d/c the [facility] treatment plan.  Thanks Selena Batten

## 2023-09-19 NOTE — Telephone Encounter (Signed)
 Spoke with patient, understood and no further questions.

## 2023-09-19 NOTE — Telephone Encounter (Signed)
-----   Message from Thornton Papas sent at 09/19/2023  8:04 AM EST ----- Please call patient, Chromogranin is stable, f/u as scheduled

## 2023-09-21 NOTE — Telephone Encounter (Signed)
Thanks Kim.

## 2023-11-06 ENCOUNTER — Ambulatory Visit (HOSPITAL_BASED_OUTPATIENT_CLINIC_OR_DEPARTMENT_OTHER): Payer: 59 | Admitting: Family

## 2024-03-17 ENCOUNTER — Inpatient Hospital Stay: Payer: 59 | Attending: Oncology

## 2024-03-17 ENCOUNTER — Inpatient Hospital Stay (HOSPITAL_BASED_OUTPATIENT_CLINIC_OR_DEPARTMENT_OTHER): Payer: 59 | Admitting: Oncology

## 2024-03-17 VITALS — BP 117/80 | HR 75 | Temp 98.3°F | Resp 18 | Ht 66.0 in | Wt 159.0 lb

## 2024-03-17 DIAGNOSIS — I2510 Atherosclerotic heart disease of native coronary artery without angina pectoris: Secondary | ICD-10-CM | POA: Insufficient documentation

## 2024-03-17 DIAGNOSIS — I10 Essential (primary) hypertension: Secondary | ICD-10-CM | POA: Diagnosis not present

## 2024-03-17 DIAGNOSIS — Z8673 Personal history of transient ischemic attack (TIA), and cerebral infarction without residual deficits: Secondary | ICD-10-CM | POA: Insufficient documentation

## 2024-03-17 DIAGNOSIS — C7B02 Secondary carcinoid tumors of liver: Secondary | ICD-10-CM | POA: Insufficient documentation

## 2024-03-17 DIAGNOSIS — C7A010 Malignant carcinoid tumor of the duodenum: Secondary | ICD-10-CM | POA: Insufficient documentation

## 2024-03-17 DIAGNOSIS — Z86000 Personal history of in-situ neoplasm of breast: Secondary | ICD-10-CM | POA: Diagnosis not present

## 2024-03-17 DIAGNOSIS — F17210 Nicotine dependence, cigarettes, uncomplicated: Secondary | ICD-10-CM | POA: Insufficient documentation

## 2024-03-17 NOTE — Progress Notes (Signed)
 [facility] OFFICE PROGRESS NOTE   Diagnosis: Carcinoid tumor  INTERVAL HISTORY:   [name] returns as scheduled.  She has frequent stools.  The stool is sometimes loose.  She has approximately 3 stools per day.  No fever or flushing.  Good appetite.  She reports intentional weight loss.  She continues to have pain at the left costal margin and left upper abdomen.  She reports the pain has been present for greater than 10 years.  She has decreased cigarette use.  Objective:  Vital signs in last 24 hours:  Blood pressure 117/80, pulse 75, temperature 98.3 F (36.8 C), temperature source Temporal, resp. rate 18, height 5' 6 (1.676 m), weight 159 lb (72.1 kg), SpO2 98%.    Lymphatics: No cervical, supraclavicular, axillary, or inguinal nodes Resp: Lungs clear bilaterally Cardio: Regular rate and rhythm GI: No hepatosplenomegaly, mild tenderness at the left upper quadrant and left costal margin, no mass Vascular: No leg edema  Lab Results:  Lab Results  Component Value Date   WBC 9.4 12/15/2022   HGB 14.0 12/15/2022   HCT 44.1 12/15/2022   MCV 88.9 12/15/2022   PLT 238 12/15/2022   NEUTROABS 7.0 12/15/2022    CMP  Lab Results  Component Value Date   NA 141 09/17/2023   K 4.1 09/17/2023   CL 106 09/17/2023   CO2 29 09/17/2023   GLUCOSE 101 (H) 09/17/2023   BUN 11 09/17/2023   CREATININE 0.64 09/17/2023   CALCIUM 9.3 09/17/2023   PROT 7.2 09/17/2023   ALBUMIN 4.3 09/17/2023   AST 15 09/17/2023   ALT 16 09/17/2023   ALKPHOS 62 09/17/2023   BILITOT 0.5 09/17/2023   GFRNONAA >60 09/17/2023   GFRAA 107 10/21/2019    Medications: I have reviewed the patient's current medications.   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, 2/5 lymph nodes, tumor invaded through the muscularis into subserosal tissues,[id number] Isolated right liver lesion noted on  multiple imaging studies dating back to 2016 CT abdomen/pelvis 03/11/2021-[facility], increased size of right hepatic lesion measuring up to 3.4 cm MRI abdomen 03/25/2021-enhancing right hepatic lesion concerning for malignancy, 3.2 x 3.7 cm Ultrasound-guided biopsy of right liver lesion 04/19/2021-well differentiated neuroendocrine tumor [facility]  05/11/2021-intense activity associated with the right hepatic lobe metastasis, single 11 mm mesenteric nodule with intense radiotracer activity CT abdomen/pelvis 11/15/2021-stable right hepatic metastasis, stable mesenteric lymph node CT abdomen/pelvis 08/16/2022 no acute findings.  Stable right hepatic lobe mass.  Stable borderline enlarged lymph nodes in the small bowel mesentery CT abdomen/pelvis 12/15/2022-solitary right hepatic metastasis-measured slightly larger, stable right lower quadrant mesenteric nodular metastasis Dotatate PET 09/07/2023: Slight enlargement of the right liver metastasis, no new liver lesion, unchanged small bowel mesenteric nodule, single focus of radiotracer activity at the small bowel at the ventral peritoneum beneath the right rectus muscle with no CT correlate no focal activity in the skeleton 2.  Left breast DCIS 2014 3.  Hypertension 4.  Tobacco use 5.  CAD/MI 6.  History of a CVA 7.  History of hepatitis C 8.  Colonoscopy 12/27/2022-two 4 mm polyps in the ascending colon, five 3 to 6 mm polyps in the sigmoid colon, descending colon and rectum-tubular adenoma and hyperplastic polyps 9.  Upper endoscopy 12/27/2022-normal esophagus, 3 cm hiatal hernia, erythematous mucosa in the gastric antrum and gastric body, nodular mucosa in the first portion of the duodenum-active H. pylori on the gastric biopsy-treated  Disposition: [name] appears stable.  We will follow-up on the chromogranin a level from today.  I have a low clinical suspicion for progression of the carcinoid tumor. The etiology the left upper abdomen/left costal margin  discomfort and tenderness is unclear.  I doubt this is related to the carcinoid diagnosis.  [name] will return for an office visit and restaging dotatate PET in 6 months.  I encouraged her to discontinue smoking.  Arley Hof, MD  03/17/2024  12:11 PM

## 2024-03-19 ENCOUNTER — Ambulatory Visit: Payer: Self-pay | Admitting: Oncology

## 2024-03-19 LAB — CHROMOGRANIN A: Chromogranin A (ng/mL): 138.6 ng/mL — ABNORMAL HIGH (ref 0.0–101.8)

## 2024-05-26 ENCOUNTER — Other Ambulatory Visit: Payer: Self-pay | Admitting: Oncology

## 2024-05-26 DIAGNOSIS — Z1231 Encounter for screening mammogram for malignant neoplasm of breast: Secondary | ICD-10-CM

## 2024-06-10 ENCOUNTER — Ambulatory Visit
Admission: RE | Admit: 2024-06-10 | Discharge: 2024-06-10 | Disposition: A | Discharge status: Home / Self Care | Source: Ambulatory Visit | Attending: Oncology | Admitting: Oncology

## 2024-06-10 DIAGNOSIS — Z1231 Encounter for screening mammogram for malignant neoplasm of breast: Secondary | ICD-10-CM

## 2024-06-12 ENCOUNTER — Other Ambulatory Visit: Payer: Self-pay

## 2024-06-12 DIAGNOSIS — N6320 Unspecified lump in the left breast, unspecified quadrant: Secondary | ICD-10-CM

## 2024-06-18 ENCOUNTER — Other Ambulatory Visit: Payer: Self-pay | Admitting: Student

## 2024-06-18 DIAGNOSIS — R197 Diarrhea, unspecified: Secondary | ICD-10-CM

## 2024-06-18 DIAGNOSIS — R109 Unspecified abdominal pain: Secondary | ICD-10-CM

## 2024-06-19 ENCOUNTER — Ambulatory Visit
Admission: RE | Admit: 2024-06-19 | Discharge: 2024-06-19 | Disposition: A | Discharge status: Home / Self Care | Source: Ambulatory Visit

## 2024-06-19 DIAGNOSIS — N6320 Unspecified lump in the left breast, unspecified quadrant: Secondary | ICD-10-CM

## 2024-06-24 ENCOUNTER — Ambulatory Visit
Admission: RE | Admit: 2024-06-24 | Discharge: 2024-06-24 | Disposition: A | Discharge status: Home / Self Care | Source: Ambulatory Visit | Attending: Student | Admitting: Student

## 2024-06-24 DIAGNOSIS — R197 Diarrhea, unspecified: Secondary | ICD-10-CM

## 2024-06-24 DIAGNOSIS — R109 Unspecified abdominal pain: Secondary | ICD-10-CM

## 2024-06-24 MED ORDER — IOPAMIDOL (ISOVUE-300) INJECTION 61%
100.0000 mL | Freq: Once | INTRAVENOUS | Status: AC | PRN
Start: 2024-06-24 — End: 2024-06-24
  Administered 2024-06-24: 100 mL via INTRAVENOUS

## 2024-06-27 ENCOUNTER — Ambulatory Visit (INDEPENDENT_AMBULATORY_CARE_PROVIDER_SITE_OTHER)

## 2024-06-27 ENCOUNTER — Encounter (HOSPITAL_COMMUNITY): Payer: Self-pay

## 2024-06-27 ENCOUNTER — Ambulatory Visit (HOSPITAL_COMMUNITY)
Admission: EM | Admit: 2024-06-27 | Discharge: 2024-06-27 | Disposition: A | Discharge status: Home / Self Care | Attending: Internal Medicine | Admitting: Internal Medicine

## 2024-06-27 DIAGNOSIS — R0789 Other chest pain: Secondary | ICD-10-CM | POA: Diagnosis not present

## 2024-06-27 DIAGNOSIS — S20211A Contusion of right front wall of thorax, initial encounter: Secondary | ICD-10-CM

## 2024-06-27 MED ORDER — ACETAMINOPHEN-CODEINE 300-30 MG PO TABS: 1.0000 | ORAL_TABLET | Freq: Four times a day (QID) | ORAL | 0 refills | Status: DC | PRN

## 2024-06-27 NOTE — Discharge Instructions (Addendum)
 X-ray done today of the right ribs and chest.  Final evaluation by the radiologist does not show any fractures or acute findings.  Symptoms and physical exam findings along with the x-ray are most consistent with a contusion of the chest wall.  We will treat with the following: Tylenol  #3 1-2 tablets every 6 hours as needed for pain.  Do not take other products containing Tylenol  while you are taking this medication.  It is okay to alternate with ibuprofen. Use incentive spirometer every 2 hours while awake to reduce the risk of infection and keep your lungs clear Ice the area 2-3 times daily for 10-15 minutes to help with pain and swelling. Do not apply ice directly to the skin.   Return to urgent care or PCP if symptoms worsen or fail to resolve.

## 2024-06-27 NOTE — ED Provider Notes (Signed)
 [facility]    CSN: [id number] Arrival date & time: 06/27/24  1314      History   Chief Complaint No chief complaint on file.   HPI [name] is a 68 y.o. female.   68 year old female who presents to urgent care with complaints of right rib pain.  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  She reports she had 2 crates that she was standing on to reach inside the door.  1 crate slipped and she landed on her right chest right under her breast on the metal part.  The other crate slipped as well and she hit it again.  She has had worsening pain along the right side under her breast since then.  The pain is worse with breathing in especially deep breaths.  She also noted some bruising.  She had a little bit of shortness of breath last night that she used her inhaler for.     Past Medical History:  Diagnosis Date   Adjustment disorder with mixed anxiety and depressed mood 01/05/2013   Allergy    Anxiety    Arthritis    knees,wrists (07/31/2014)   Brain aneurysm 2012   2cms.   Breast cancer (HCC) 06/05/2013   Breast disorder    lump in left breast   Carcinoid tumor (HCC)    Complication of anesthesia    hallucinations , anxiety    Depression    H/O echocardiogram    not finding report in EPIC, pt. reports that it was before her appt. /w Dr. Delford    Hepatitis B dx'd ~ 1980   Hepatitis C dx'd ~ 2000   specialist says it cleared itself on it's own   Hx of radiation therapy 07/17/13-09/03/13   left breast,60.4Gy, 32/33 fx completed   Hyperlipidemia    Hypertension    never took my RX (07/31/2014)   Jaundice    Malignant neoplasm of lower-outer quadrant of female breast (HCC) 05/19/2013   Pain management    goes to [facility] clinic, but she doesn't intend to return    Personal history of radiation therapy    2014   Pure hypercholesterolemia 10/23/2019   Stroke (HCC) 2012   TIA   TIA (transient ischemic attack) 06/2011   can't see  real well out of left eye since; weaker on left side since   Varicose veins    Walking pneumonia ~ 2008    Patient Active Problem List   Diagnosis Date Noted   Pure hypercholesterolemia 10/23/2019   Takotsubo cardiomyopathy 12/25/2017   Cerebrovascular accident (CVA) (HCC)    Elevated troponin    NSTEMI (non-ST elevated myocardial infarction) (HCC) 12/23/2017   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HCC) 11/18/2013   Ductal carcinoma in situ (DCIS) of left breast 04/07/2013   Partial small bowel obstruction (HCC) 04/07/2013   Adjustment disorder with mixed anxiety and depressed mood 01/05/2013   Chronic pain syndrome 01/05/2013   Thyroid cyst 01/05/2013   Tobacco abuse 12/05/2012   Hepatitis C    Varicose veins    Brain aneurysm    TIA (transient ischemic attack) 06/12/2011    Past Surgical History:  Procedure Laterality Date   BREAST BIOPSY Left 03/2013; 05/2013   BREAST LUMPECTOMY Left 04/02/2013   Procedure: Excision  of Left Breast Mass;  Surgeon: Vicenta DELENA Poli, MD;  Location: [facility] OR;  Service: General;  Laterality: Left;   BREAST LUMPECTOMY WITH SENTINEL LYMPH NODE BIOPSY Left 06/05/2013  Procedure: BREAST LUMPECTOMY WITH SENTINEL LYMPH NODE BX;  Surgeon: Vicenta DELENA Poli, MD;  Location: [facility];  Service: General;  Laterality: Left;   CARDIAC CATHETERIZATION  12/24/2017   COLON SURGERY  03/2013   bowel obstruction , malignant tumor     DILATION AND CURETTAGE OF UTERUS  ~ 1990   S/P miscarriage   EVACUATION BREAST HEMATOMA Left 06/05/2013   Procedure: EVACUATION HEMATOMA BREAST;  Surgeon: Vicenta DELENA Poli, MD;  Location: [facility];  Service: General;  Laterality: Left;   LAPAROSCOPY N/A 04/02/2013   Procedure: Diaganostic Lap.; Small Bowel Resection;  Surgeon: Vicenta DELENA Poli, MD;  Location: [facility];  Service: General;  Laterality: N/A;   LEFT HEART CATH AND CORONARY ANGIOGRAPHY N/A 12/24/2017   Procedure: LEFT HEART CATH AND CORONARY ANGIOGRAPHY;  Surgeon: Dann Candyce RAMAN, MD;  Location: [facility];  Service: Cardiovascular;  Laterality: N/A;   TUMOR EXCISION  04/02/2013   carcoid tumor removed   vaginal births   1975    birthed twins at -[redacted] weeks gestation - both survived , total of 3 pregnancies - all born vaginally    OB History     Gravida  5   Para  4   Term      Preterm  2   AB  1   Living  4      SAB      IAB  1   Ectopic      Multiple      Live Births               Home Medications    Prior to Admission medications   Medication Sig Start Date End Date Taking? Authorizing Provider  acetaminophen -codeine (TYLENOL  #3) 300-30 MG tablet Take 1-2 tablets by mouth every 6 (six) hours as needed for moderate pain (pain score 4-6). 06/27/24  Yes Debi Cousin A, PA-C  albuterol  (VENTOLIN  HFA) 108 (90 Base) MCG/ACT inhaler Inhale 2 puffs into the lungs every 6 (six) hours as needed. 03/26/23  Yes [provider]  ALPRAZolam  (XANAX ) 1 MG tablet Take 1 tablet (1 mg total) by mouth 2 (two) times daily as needed for anxiety. 06/04/15  Yes Gudena, Vinay, MD  aluminum-magnesium  hydroxide 200-200 MG/5ML suspension Take 5 mLs by mouth as needed for indigestion.   Yes [provider]  aspirin  EC 81 MG tablet Take 81 mg by mouth daily.   Yes [provider]  Calcium Carbonate Antacid (CALCIUM CARBONATE, DOSED IN MG ELEMENTAL CALCIUM,) 1250 MG/5ML SUSP Take 1,200 mg by mouth. occasional   Yes [provider]  cyanocobalamin (VITAMIN B12) 1000 MCG/ML injection Inject 1,000 mcg into the muscle every 30 (thirty) days. 03/10/24  Yes [provider]  lisinopril  (ZESTRIL ) 20 MG tablet Take 20 mg by mouth daily. Taking 30 mg 05/29/22  Yes [provider]  NON FORMULARY Take 1 drop by mouth 4 (four) times daily - after meals and at bedtime.   Yes [provider]  simethicone (MYLICON) 80 MG chewable tablet Chew 80 mg by mouth every 6 (six) hours as needed for flatulence.    Yes [provider]    Family History Family History  Problem Relation Age of Onset   Diabetes Mother    Heart disease Mother    Cancer Mother        cervical   Cancer Father        lung   Heart disease Sister    Diabetes Sister  Cancer Sister        cervical   Arrhythmia Sister    Diabetes Paternal Grandmother    Diabetes Paternal Grandfather    Diabetes Brother    Stroke Brother    Heart disease Brother    Breast cancer Neg Hx     Social History Social History   Tobacco Use   Smoking status: Some Days    Current packs/day: 0.00    Average packs/day: 0.5 packs/day for 36.0 years (18.0 ttl pk-yrs)    Types: Cigarettes    Start date: 12/22/1981    Last attempt to quit: 12/22/2017    Years since quitting: 6.5   Smokeless tobacco: Never   Tobacco comments:    smokes some days, use patch some days  Vaping Use   Vaping status: Never Used  Substance Use Topics   Alcohol use: Yes    Alcohol/week: 3.0 standard drinks of alcohol    Types: 3 Glasses of wine per week   Drug use: Yes    Types: Marijuana    Comment: last marijuana ~ 06/2014     Allergies   Statins, Tramadol , Ace inhibitors, Prozac  [fluoxetine ], Wellbutrin  [bupropion ], Ciprofloxacin  hcl, and Sertraline hcl   Review of Systems Review of Systems  Constitutional:  Negative for chills and fever.  HENT:  Negative for ear pain and sore throat.   Eyes:  Negative for pain and visual disturbance.  Respiratory:  Positive for shortness of breath (mild last night). Negative for cough.        Pain with deep breathing on right side under breast along chest wall   Cardiovascular:  Negative for chest pain and palpitations.  Gastrointestinal:  Negative for abdominal pain and vomiting.  Genitourinary:  Negative for dysuria and hematuria.  Musculoskeletal:  Negative for arthralgias and back pain.  Skin:  Negative for color change and rash.  Neurological:  Negative for seizures and syncope.  All other  systems reviewed and are negative.    Physical Exam Triage Vital Signs ED Triage Vitals  Encounter Vitals Group     BP 06/27/24 1334 120/76     Girls Systolic BP Percentile --      Girls Diastolic BP Percentile --      Boys Systolic BP Percentile --      Boys Diastolic BP Percentile --      Pulse Rate 06/27/24 1334 83     Resp 06/27/24 1334 20     Temp 06/27/24 1334 98.2 F (36.8 C)     Temp Source 06/27/24 1334 Oral     SpO2 06/27/24 1334 96 %     Weight --      Height --      Head Circumference --      Peak Flow --      Pain Score 06/27/24 1341 10     Pain Loc --      Pain Education --      Exclude from Growth Chart --    No data found.  Updated Vital Signs BP 120/76 (BP Location: Left Arm)   Pulse 83   Temp 98.2 F (36.8 C) (Oral)   Resp 20   SpO2 96%   Visual Acuity Right Eye Distance:   Left Eye Distance:   Bilateral Distance:    Right Eye Near:   Left Eye Near:    Bilateral Near:     Physical Exam Vitals and nursing note reviewed.  Constitutional:      General: She is not  in acute distress.    Appearance: She is well-developed.  HENT:     Head: Normocephalic and atraumatic.  Eyes:     Conjunctiva/sclera: Conjunctivae normal.  Cardiovascular:     Rate and Rhythm: Normal rate and regular rhythm.     Heart sounds: No murmur heard. Pulmonary:     Effort: Pulmonary effort is normal. No respiratory distress.     Breath sounds: Normal breath sounds.  Chest:    Abdominal:     Palpations: Abdomen is soft.     Tenderness: There is no abdominal tenderness.  Musculoskeletal:        General: No swelling.     Cervical back: Neck supple.  Skin:    General: Skin is warm and dry.     Capillary Refill: Capillary refill takes less than 2 seconds.  Neurological:     Mental Status: She is alert.  Psychiatric:        Mood and Affect: Mood normal.      UC Treatments / Results  Labs (all labs ordered are listed, but only abnormal results are  displayed) Labs Reviewed - No data to display  EKG   Radiology DG Ribs Unilateral W/Chest Right Result Date: 06/27/2024 CLINICAL DATA:  landed on right chest wall under right breast on metal dumpster 1 week ago. EXAM: RIGHT RIBS AND CHEST - 3+ VIEW COMPARISON:  11/25/2022. FINDINGS: Bilateral lung fields are clear. Bilateral costophrenic angles are clear. Normal cardio-mediastinal silhouette. No acute osseous abnormalities. No acute rib fracture.  No focal rib lesion. The soft tissues are within normal limits. IMPRESSION: No acute cardiopulmonary abnormality. No acute rib fracture. Electronically Signed   By: Ree Molt M.D.   On: 06/27/2024 14:28    Procedures Procedures (including critical care time)  Medications Ordered in UC Medications - No data to display  Initial Impression / Assessment and Plan / UC Course  I have reviewed the triage vital signs and the nursing notes.  Pertinent labs & imaging results that were available during my care of the patient were reviewed by me and considered in my medical decision making (see chart for details).     Rib pain on right side - Plan: DG Ribs Unilateral W/Chest Right, DG Ribs Unilateral W/Chest Right  Contusion of right chest wall, initial encounter   X-ray done today of the right ribs and chest.  Final evaluation by the radiologist does not show any fractures or acute findings.  Symptoms and physical exam findings along with the x-ray are most consistent with a contusion of the chest wall.  We will treat with the following: Tylenol  #3 1-2 tablets every 6 hours as needed for pain.  Do not take other products containing Tylenol  while you are taking this medication.  It is okay to alternate with ibuprofen. Use incentive spirometer every 2 hours while awake to reduce the risk of infection and keep your lungs clear Ice the area 2-3 times daily for 10-15 minutes to help with pain and swelling. Do not apply ice directly to the skin.    Return to urgent care or PCP if symptoms worsen or fail to resolve.    Final Clinical Impressions(s) / UC Diagnoses   Final diagnoses:  Rib pain on right side  Contusion of right chest wall, initial encounter     Discharge Instructions      X-ray done today of the right ribs and chest.  Final evaluation by the radiologist does not show any fractures or acute findings.  Symptoms and physical exam findings along with the x-ray are most consistent with a contusion of the chest wall.  We will treat with the following: Tylenol  #3 1-2 tablets every 6 hours as needed for pain.  Do not take other products containing Tylenol  while you are taking this medication.  It is okay to alternate with ibuprofen. Use incentive spirometer every 2 hours while awake to reduce the risk of infection and keep your lungs clear Ice the area 2-3 times daily for 10-15 minutes to help with pain and swelling. Do not apply ice directly to the skin.   Return to urgent care or PCP if symptoms worsen or fail to resolve.       ED Prescriptions     Medication Sig Dispense Auth. Provider   acetaminophen -codeine (TYLENOL  #3) 300-30 MG tablet Take 1-2 tablets by mouth every 6 (six) hours as needed for moderate pain (pain score 4-6). 15 tablet Teresa Almarie LABOR, [place]      I have reviewed the PDMP during this encounter.   Teresa Almarie LABOR, [place] 06/27/24 1441

## 2024-06-27 NOTE — ED Triage Notes (Signed)
 Patient states right side rib pain x 1 week. Patient states she was dumpster diving and fell over the garage can.

## 2024-06-28 ENCOUNTER — Ambulatory Visit (HOSPITAL_COMMUNITY)
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
## 2024-08-04 ENCOUNTER — Ambulatory Visit (INDEPENDENT_AMBULATORY_CARE_PROVIDER_SITE_OTHER): Admitting: Emergency Medicine

## 2024-08-04 ENCOUNTER — Other Ambulatory Visit (HOSPITAL_BASED_OUTPATIENT_CLINIC_OR_DEPARTMENT_OTHER): Payer: Self-pay

## 2024-08-04 ENCOUNTER — Encounter (HOSPITAL_BASED_OUTPATIENT_CLINIC_OR_DEPARTMENT_OTHER): Payer: Self-pay | Admitting: Emergency Medicine

## 2024-08-04 VITALS — BP 184/100 | HR 67 | Ht 66.0 in | Wt 163.0 lb

## 2024-08-04 DIAGNOSIS — E785 Hyperlipidemia, unspecified: Secondary | ICD-10-CM | POA: Diagnosis not present

## 2024-08-04 DIAGNOSIS — I2510 Atherosclerotic heart disease of native coronary artery without angina pectoris: Secondary | ICD-10-CM | POA: Diagnosis not present

## 2024-08-04 DIAGNOSIS — Z5941 Food insecurity: Secondary | ICD-10-CM

## 2024-08-04 DIAGNOSIS — I10 Essential (primary) hypertension: Secondary | ICD-10-CM | POA: Diagnosis not present

## 2024-08-04 DIAGNOSIS — I5181 Takotsubo syndrome: Secondary | ICD-10-CM

## 2024-08-04 MED ORDER — AMLODIPINE BESYLATE 5 MG PO TABS
5.0000 mg | ORAL_TABLET | Freq: Every day | ORAL | 1 refills | Status: AC
  Filled 2024-08-04: qty 90, 90d supply, fill #0

## 2024-08-04 MED ORDER — ASPIRIN EC 81 MG PO TBEC
81.0000 mg | DELAYED_RELEASE_TABLET | Freq: Every day | ORAL | 6 refills | Status: AC
  Filled 2024-08-04: qty 30, 30d supply, fill #0

## 2024-08-04 MED ORDER — LISINOPRIL 20 MG PO TABS
ORAL_TABLET | ORAL | 4 refills | Status: AC
  Filled 2024-08-04: qty 60, 30d supply, fill #0

## 2024-08-04 NOTE — Assessment & Plan Note (Addendum)
 Diagnosed in January 2019, echo with normalized EF in July 2019 No SOB, edema, palpitations

## 2024-08-04 NOTE — Patient Instructions (Addendum)
 Medication Instructions:   START taking Aspirin  81 mg by mouth daily NOW--(take this when you get home)  START taking Amlodipine  5 mg by mouth daily NOW--(take this when you get home)  INCREASE your Lisinopril  to 20 mg by mouth in the morning and take 20 mg by mouth in the evening, everyday--(you can take your first dose when you get home)   ALL YOUR MEDICATIONS WERE SENT TO THE PHARMACY DOWNSTAIRS FIRST FLOOR ([facility])   Labwork:  RETURN FOR LAB WORK IN 2 WEEKS (AROUND 08/18/24)--COME TO 3RD FLOOR [address] AT PRIMARY CARE TO HAVE DONE---BMET    Follow-Up: Please follow up in _ONE_ month with Dr. Raford, Reche Finder, NP or Rosaline Bane, NP   Special Instructions:   Please start checking your blood pressure on a regular basis  Please refer to the ER if you have reoccurring chest pain

## 2024-08-05 ENCOUNTER — Telehealth (HOSPITAL_COMMUNITY): Payer: Self-pay | Admitting: Licensed Clinical Social Worker

## 2024-08-05 NOTE — Telephone Encounter (Signed)
 CSW consulted to speak with pt regarding food insecurity.  Unable to reach- left VM requesting return call  Andriette HILARIO Leech, LCSW Clinical Social Worker [facility] Desk#: [phone] Cell#: [phone]

## 2024-08-08 ENCOUNTER — Emergency Department (HOSPITAL_COMMUNITY): Admission: EM | Admit: 2024-08-08 | Discharge: 2024-08-08 | Disposition: A | Discharge status: Home / Self Care

## 2024-08-08 ENCOUNTER — Encounter (HOSPITAL_COMMUNITY): Payer: Self-pay | Admitting: *Deleted

## 2024-08-08 ENCOUNTER — Emergency Department (HOSPITAL_COMMUNITY)

## 2024-08-08 ENCOUNTER — Other Ambulatory Visit: Payer: Self-pay

## 2024-08-08 DIAGNOSIS — R059 Cough, unspecified: Secondary | ICD-10-CM | POA: Diagnosis not present

## 2024-08-08 DIAGNOSIS — R519 Headache, unspecified: Secondary | ICD-10-CM | POA: Diagnosis not present

## 2024-08-08 DIAGNOSIS — R0602 Shortness of breath: Secondary | ICD-10-CM | POA: Diagnosis present

## 2024-08-08 DIAGNOSIS — F419 Anxiety disorder, unspecified: Secondary | ICD-10-CM | POA: Insufficient documentation

## 2024-08-08 DIAGNOSIS — I10 Essential (primary) hypertension: Secondary | ICD-10-CM | POA: Diagnosis not present

## 2024-08-08 DIAGNOSIS — Z7982 Long term (current) use of aspirin: Secondary | ICD-10-CM | POA: Diagnosis not present

## 2024-08-08 LAB — CBC WITH DIFFERENTIAL/PLATELET
Abs Immature Granulocytes: 0.02 K/uL (ref 0.00–0.07)
Basophils Absolute: 0 K/uL (ref 0.0–0.1)
Basophils Relative: 0 %
Eosinophils Absolute: 0.2 K/uL (ref 0.0–0.5)
Eosinophils Relative: 2 %
HCT: 42.9 % (ref 36.0–46.0)
Hemoglobin: 14.1 g/dL (ref 12.0–15.0)
Immature Granulocytes: 0 %
Lymphocytes Relative: 21 %
Lymphs Abs: 1.4 K/uL (ref 0.7–4.0)
MCH: 28.7 pg (ref 26.0–34.0)
MCHC: 32.9 g/dL (ref 30.0–36.0)
MCV: 87.2 fL (ref 80.0–100.0)
Monocytes Absolute: 0.5 K/uL (ref 0.1–1.0)
Monocytes Relative: 7 %
Neutro Abs: 4.5 K/uL (ref 1.7–7.7)
Neutrophils Relative %: 70 %
Platelets: 227 K/uL (ref 150–400)
RBC: 4.92 MIL/uL (ref 3.87–5.11)
RDW: 13.6 % (ref 11.5–15.5)
WBC: 6.5 K/uL (ref 4.0–10.5)
nRBC: 0 % (ref 0.0–0.2)

## 2024-08-08 LAB — URINALYSIS, ROUTINE W REFLEX MICROSCOPIC
Bilirubin Urine: NEGATIVE
Glucose, UA: NEGATIVE mg/dL
Hgb urine dipstick: NEGATIVE
Ketones, ur: NEGATIVE mg/dL
Leukocytes,Ua: NEGATIVE
Nitrite: NEGATIVE
Protein, ur: NEGATIVE mg/dL
Specific Gravity, Urine: 1.014 (ref 1.005–1.030)
pH: 5 (ref 5.0–8.0)

## 2024-08-08 LAB — RESP PANEL BY RT-PCR (RSV, FLU A&B, COVID)  RVPGX2
Influenza A by PCR: NEGATIVE
Influenza B by PCR: NEGATIVE
Resp Syncytial Virus by PCR: NEGATIVE
SARS Coronavirus 2 by RT PCR: NEGATIVE

## 2024-08-08 LAB — I-STAT CHEM 8, ED
BUN: 13 mg/dL (ref 8–23)
Calcium, Ion: 1.18 mmol/L (ref 1.15–1.40)
Chloride: 107 mmol/L (ref 98–111)
Creatinine, Ser: 0.8 mg/dL (ref 0.44–1.00)
Glucose, Bld: 88 mg/dL (ref 70–99)
HCT: 43 % (ref 36.0–46.0)
Hemoglobin: 14.6 g/dL (ref 12.0–15.0)
Potassium: 3.8 mmol/L (ref 3.5–5.1)
Sodium: 142 mmol/L (ref 135–145)
TCO2: 24 mmol/L (ref 22–32)

## 2024-08-08 LAB — BASIC METABOLIC PANEL WITH GFR
Anion gap: 11 (ref 5–15)
BUN: 13 mg/dL (ref 8–23)
CO2: 24 mmol/L (ref 22–32)
Calcium: 9.2 mg/dL (ref 8.9–10.3)
Chloride: 107 mmol/L (ref 98–111)
Creatinine, Ser: 0.69 mg/dL (ref 0.44–1.00)
GFR, Estimated: >60 mL/min (ref >60)
Glucose, Bld: 97 mg/dL (ref 70–99)
Potassium: 3.9 mmol/L (ref 3.5–5.1)
Sodium: 142 mmol/L (ref 135–145)

## 2024-08-08 LAB — TROPONIN T, HIGH SENSITIVITY: Troponin T High Sensitivity: 16 ng/L (ref 0–19)

## 2024-08-08 MED ORDER — ALPRAZOLAM 0.5 MG PO TABS
0.5000 mg | ORAL_TABLET | Freq: Once | ORAL | Status: AC
  Administered 2024-08-08: 0.5 mg via ORAL
  Filled 2024-08-08: qty 1

## 2024-08-08 MED ORDER — LACTATED RINGERS IV BOLUS
500.0000 mL | Freq: Once | INTRAVENOUS | Status: AC
  Administered 2024-08-08: 500 mL via INTRAVENOUS

## 2024-08-08 MED ORDER — IOHEXOL 350 MG/ML SOLN
75.0000 mL | Freq: Once | INTRAVENOUS | Status: AC | PRN
  Administered 2024-08-08: 75 mL via INTRAVENOUS

## 2024-08-08 MED ORDER — SODIUM CHLORIDE (PF) 0.9 % IJ SOLN
INTRAMUSCULAR | Status: AC
Start: 2024-08-08 — End: 2024-08-08
  Filled 2024-08-08: qty 50

## 2024-08-08 NOTE — ED Provider Notes (Signed)
 [place] EMERGENCY DEPARTMENT AT [facility] Provider Note   CSN: [id number] Arrival date & time: 08/08/24  9074     Patient presents with: Shortness of Breath   [name] is a 68 y.o. female.   68 year old female presents for evaluation of high blood pressure.  States I do not feel like myself.  States a week ago she had her blood pressure meds increased as she was at her doctor's office and blood pressure was 200s over 100.  She states she has had headache and off-and-on dizziness as well as fatigue and decreased appetite.  States has been compliant with her meds.  States she took her first dose of amlodipine  this morning but her blood pressure was 180 systolic at home.  She admits to increased cough as well.  Denies any other symptoms or concerns.   Shortness of Breath Associated symptoms: cough and headaches   Associated symptoms: no abdominal pain, no chest pain, no ear pain, no fever, no rash, no sore throat and no vomiting        Prior to Admission medications   Medication Sig Start Date End Date Taking? Authorizing Provider  acetaminophen -codeine (TYLENOL  #3) 300-30 MG tablet Take 1-2 tablets by mouth every 6 (six) hours as needed for moderate pain (pain score 4-6). 06/27/24   White, Elizabeth A, PA-C  albuterol  (VENTOLIN  HFA) 108 (90 Base) MCG/ACT inhaler Inhale 2 puffs into the lungs every 6 (six) hours as needed. 03/26/23   [provider]  ALPRAZolam  (XANAX ) 1 MG tablet Take 1 tablet (1 mg total) by mouth 2 (two) times daily as needed for anxiety. 06/04/15   Gudena, Vinay, MD  aluminum-magnesium  hydroxide 200-200 MG/5ML suspension Take 5 mLs by mouth as needed for indigestion.    [provider]  amLODipine  (NORVASC ) 5 MG tablet Take 1 tablet (5 mg total) by mouth daily. 08/04/24   Campbell, Kenzie E, NP  aspirin  EC 81 MG tablet Take 1 tablet (81 mg total) by mouth daily. 08/04/24   Campbell, Kenzie E, NP  Calcium Carbonate Antacid  (CALCIUM CARBONATE, DOSED IN MG ELEMENTAL CALCIUM,) 1250 MG/5ML SUSP Take 1,200 mg by mouth. occasional    [provider]  cyanocobalamin (VITAMIN B12) 1000 MCG/ML injection Inject 1,000 mcg into the muscle every 30 (thirty) days. 03/10/24   [provider]  lisinopril  (ZESTRIL ) 20 MG tablet Take 1 tablet (20 mg total) by mouth in the AM, and take 1 tablet (20 mg total) by mouth in the PM everyday. 08/04/24   Campbell, Kenzie E, NP  NON FORMULARY Take 1 drop by mouth 4 (four) times daily - after meals and at bedtime. Patient not taking: Reported on 08/04/2024    [provider]  simethicone (MYLICON) 80 MG chewable tablet Chew 80 mg by mouth every 6 (six) hours as needed for flatulence.    [provider]    Allergies: Statins, Tramadol , Ace inhibitors, Buspirone, Prozac  [fluoxetine ], Wellbutrin  [bupropion ], Ciprofloxacin  hcl, and Sertraline hcl    Review of Systems  Constitutional:  Negative for chills and fever.  HENT:  Negative for ear pain and sore throat.   Eyes:  Negative for pain and visual disturbance.  Respiratory:  Positive for cough and shortness of breath.   Cardiovascular:  Negative for chest pain and palpitations.  Gastrointestinal:  Negative for abdominal pain and vomiting.  Genitourinary:  Negative for dysuria and hematuria.  Musculoskeletal:  Negative for arthralgias and back pain.  Skin:  Negative for color change  and rash.  Neurological:  Positive for dizziness and headaches. Negative for seizures and syncope.  All other systems reviewed and are negative.   Updated Vital Signs BP (!) 180/94 (BP Location: Left Arm)   Pulse 61   Temp 98.3 F (36.8 C) (Oral)   Resp 18   Wt 72.6 kg   SpO2 98%   BMI 25.82 kg/m   Physical Exam Vitals and nursing note reviewed.  Constitutional:      General: She is not in acute distress.    Appearance: She is well-developed. She is not ill-appearing.  HENT:     Head: Normocephalic and  atraumatic.  Eyes:     Conjunctiva/sclera: Conjunctivae normal.  Cardiovascular:     Rate and Rhythm: Normal rate and regular rhythm.     Heart sounds: No murmur heard. Pulmonary:     Effort: Pulmonary effort is normal. No respiratory distress.     Breath sounds: Normal breath sounds.  Abdominal:     Palpations: Abdomen is soft.     Tenderness: There is no abdominal tenderness.  Musculoskeletal:        General: No swelling.     Cervical back: Neck supple.  Skin:    General: Skin is warm and dry.     Capillary Refill: Capillary refill takes less than 2 seconds.  Neurological:     General: No focal deficit present.     Mental Status: She is alert.     Comments: Finger to nose and heel to shin intact, no nystagmus   Psychiatric:        Mood and Affect: Mood normal.     (all labs ordered are listed, but only abnormal results are displayed) Labs Reviewed  RESP PANEL BY RT-PCR (RSV, FLU A&B, COVID)  [id number]  BASIC METABOLIC PANEL WITH GFR  CBC WITH DIFFERENTIAL/PLATELET  URINALYSIS, ROUTINE W REFLEX MICROSCOPIC  I-STAT CHEM 8, ED  TROPONIN T, HIGH SENSITIVITY  TROPONIN T, HIGH SENSITIVITY    EKG: EKG Interpretation Date/Time:  Friday August 08 2024 09:32:38 EST Ventricular Rate:  71 PR Interval:  142 QRS Duration:  92 QT Interval:  407 QTC Calculation: 443 R Axis:   12  Text Interpretation: Sinus rhythm Abnormal R-wave progression, early transition Probable LVH with secondary repol abnrm Compared with prior EKG from 11/24/20235 Confirmed by Gennaro Bouchard ([id number]) on 08/08/2024 9:37:23 AM  Radiology: CT Angio Head Neck W WO CM Result Date: 08/08/2024 EXAM: CTA HEAD AND NECK WITHOUT AND WITH 08/08/2024 11:11:56 AM TECHNIQUE: CTA of the head and neck was performed without and with the administration of 75 mL of iohexol  (OMNIPAQUE ) 350 MG/ML injection. Multiplanar 2D and/or 3D reformatted images are provided for review. Automated exposure control, iterative  reconstruction, and/or weight based adjustment of the mA/kV was utilized to reduce the radiation dose to as low as reasonably achievable. Stenosis of the internal carotid arteries measured using NASCET criteria. COMPARISON: CT of the head dated 05/01/2017 CLINICAL HISTORY: headache, dizziness, vision changes FINDINGS: CTA NECK: AORTIC ARCH AND ARCH VESSELS: Mild calcific plaque within the aortic arch. Standard 3-vessel takeoff of the great arteries. No dissection or arterial injury. No significant stenosis of the brachiocephalic or subclavian arteries. CERVICAL CAROTID ARTERIES: Mild calcific plaque within the carotid bulbs. Mild stenosis of the origin of the right internal carotid artery with dilatation, though the degree of stenosis is less than 20%. The cervical segment of the right internal carotid artery is mildly tortuous but otherwise normal in caliber. The left internal carotid  artery is patent without significant stenosis or dissection. No dissection or arterial injury. No hemodynamically significant stenosis by NASCET criteria. CERVICAL VERTEBRAL ARTERIES: No dissection, arterial injury, or significant stenosis. LUNGS AND MEDIASTINUM: Mild-to-moderate centrilobular and paraseptal emphysema. SOFT TISSUES: No acute abnormality. BONES: No acute abnormality. CTA HEAD: ANTERIOR CIRCULATION: Minimal calcific plaque within the carotid siphons. No significant stenosis of the internal carotid arteries. No significant stenosis of the anterior cerebral arteries. No significant stenosis of the middle cerebral arteries. No aneurysm. POSTERIOR CIRCULATION: No significant stenosis of the posterior cerebral arteries. No significant stenosis of the basilar artery. No significant stenosis of the vertebral arteries. No aneurysm. OTHER: No dural venous sinus thrombosis on this non-dedicated study. IMPRESSION: 1. Mild stenosis of the origin of the right internal carotid artery with post-stenotic dilatation, less than 20%. 2.  Mild-to-moderate centrilobular and paraseptal emphysema; given emphysema is Margaret independent lung cancer risk factor, consider evaluation for a low-dose CT lung cancer screening program if the patient is between 47 and 38 years old. Electronically signed by: Evalene Coho MD 08/08/2024 11:38 AM EST RP Workstation: [id number]   DG Chest 1 View Result Date: 08/08/2024 CLINICAL DATA:  Cough.  Shortness of breath. EXAM: DG CHEST 1V COMPARISON:  06/27/2024. FINDINGS: Cardiac silhouette is normal in size. No mediastinal or hilar masses. Clear lungs.  No pleural effusion or pneumothorax. Skeletal structures are grossly intact. IMPRESSION: No active disease. Electronically Signed   By: Alm Parkins M.D.   On: 08/08/2024 10:36     Procedures   Medications Ordered in the ED  ALPRAZolam  (XANAX ) tablet 0.5 mg (has no administration in time range)  lactated ringers  bolus 500 mL (500 mLs Intravenous New Bag/Given 08/08/24 1021)  iohexol  (OMNIPAQUE ) 350 MG/ML injection 75 mL (75 mLs Intravenous Contrast Given 08/08/24 1102)                                    Medical Decision Making Cardiac monitor interpretation: Sinus rhythm, no ectopy  Patient here for evaluation of multiple symptoms.  Blood pressure fairly stable here in the 180s.  She is not really having any neurologic signs or symptoms and no cardiac signs or symptoms.  CTA of the head and neck and lab workup completely unremarkable.  Did endorse to me that she ran out of her Xanax  and she thinks some of her symptoms may be related to her anxiety.  I will give her 1 dose of Xanax  here and she will plan to pick up her prescription when it can be refilled next week.  I did give her some IV fluids in the ER.  Advise close follow-up with primary care and otherwise return to the ER for new or worsening symptoms.  She feels comfortable to plan be discharged home.  Problems Addressed: Anxiety: acute illness or injury Cough, unspecified type: undiagnosed  new problem with uncertain prognosis Hypertension, unspecified type: chronic illness or injury Nonintractable headache, unspecified chronicity pattern, unspecified headache type: acute illness or injury  Amount and/or Complexity of Data Reviewed External Data Reviewed: notes.    Details: Prior records reviewed and patient had Margaret urgent care visit 06-27-24 for rib pain Labs: ordered. Decision-making details documented in ED Course.    Details: Ordered and reviewed by me and unremarkable Radiology: ordered and independent interpretation performed. Decision-making details documented in ED Course.    Details: Ordered and inter by me independently radiology Chest x-ray: Shows no acute  abnormality  CT angiogram of the head and neck ordered and reviewed by me and shows no evidence of acute abnormality ECG/medicine tests: ordered and independent interpretation performed. Decision-making details documented in ED Course.    Details: Ordered and interpreted by me in the absence of cardiology and shows sinus rhythm, no STEMI or significant change with compared to prior  Risk OTC drugs. Prescription drug management. Drug therapy requiring intensive monitoring for toxicity.     Final diagnoses:  Anxiety  Hypertension, unspecified type  Nonintractable headache, unspecified chronicity pattern, unspecified headache type  Cough, unspecified type    ED Discharge Orders     None          Gennaro Duwaine CROME, DO 08/08/24 1223

## 2024-08-08 NOTE — Discharge Instructions (Addendum)
 Stay on your medications as prescribed.  Return to the ER for new or worsening symptoms.

## 2024-08-08 NOTE — ED Triage Notes (Addendum)
 Here by POV from home for sob, as well as cough, chest tightness, anxiousness, tension HA, dizziness, and not feeling self. Relates sx to high BP. Onset earlier this week. Seen [facility] yesterday and was told to go to ED, but I did not. I came today when my BP meds did not work. L facial droop noted. On exam mentions LLE has decreased sensation compared to the RLE. L face with asymmetrical droop. Alert, NAD, calm, interactive, resps e/u, speaking in clear complete sentences.Skin W&D.

## 2024-08-18 ENCOUNTER — Encounter: Payer: Self-pay | Admitting: Oncology

## 2024-08-18 DIAGNOSIS — C7B02 Secondary carcinoid tumors of liver: Secondary | ICD-10-CM

## 2024-08-18 DIAGNOSIS — M25512 Pain in left shoulder: Secondary | ICD-10-CM

## 2024-09-13 NOTE — Progress Notes (Deleted)
" °  Cardiology Office Note   Date:  09/13/2024  ID:  [name], DOB March 23, 1956, MRN [id number] PCP: Jacques Camie Pepper, PA-C  [facility] Providers Cardiologist:  Annabella Scarce, MD { Click to update primary MD,subspecialty MD or APP then REFRESH:1}    PMH Takotsubo cardiomyopathy Hypertension Hyperlipidemia CVA Breast cancer s/p XRT Nonobstructive CAD Tobacco abuse  Admission 12/2017 following a syncopal episode in the setting of a family argument.  Troponin was positive.  She underwent cath with 10% RCA stenosis and EF 25 to 30% via left ventriculography.  She was placed on low-dose Toprol  but her course was complicated by hypotension.  Echo during that admission revealed LVEF 50 to 55%, G1 DD, hypokinesis of mid to apical inferolateral and inferior myocardium.  She established with Dr. Scarce.  Echo 03/2018 EF 55 to 60%, normal wall motion, grade 1 DD, mild AI.  Coronary CTA 01/25/2022 with minimal CAD, less than 25% stenosis RCA, severe left atrial enlargement and mild right atrial enlargement.  At office visit 06/01/2022 with Reche Finder, NP she reported not feeling good but could not provide further details.  She was on Zetia  and lisinopril  had recently been increased to 20 mg daily because BP in clinic was 180/98.  She was not monitoring home BP.  She unfortunately continued to smoke which frustrated her.  She reported chest discomfort atypical of angina which was under her left breast, constant, and tender on palpation.  She was referred to lipid clinic for management of hyperlipidemia.  She was referred to health coach for smoking cessation.  No additional change in medical therapy.  Last cardiology clinic visit was 08/04/2024 with Kenzie Campbell, NP.  She reported not feeling well, having some central chest tightness, headache, dizziness, and nausea.  On arrival BP was 202/108 and she reported chest tightness and headache had resolved.  Having SOB that is chronic at  baseline, no orthopnea, weight gain, or swelling.  She admitted to Food insecurity with reduction of snap benefits and had been dumpster diving.  She reported she did not want to start PCSK9 inhibitor for management of hyperlipidemia.  She was advised to start amlodipine  5 mg daily and increase lisinopril  to 20 mg twice daily.  She was given items from the food pantry and referral to social work was placed.  Advised to return in 1 month for follow-up.   History of Present Illness [name] is a 69 y.o. female ***  ROS: ***  Studies Reviewed      ***  No results found for: LIPOA  Risk Assessment/Calculations {Does this patient have ATRIAL FIBRILLATION?:[id number]} No BP recorded.  {Refresh Note OR Click here to enter BP  :1}***       Physical Exam VS:  There were no vitals taken for this visit.   Wt Readings from Last 3 Encounters:  08/08/24 160 lb (72.6 kg)  08/04/24 163 lb (73.9 kg)  03/17/24 159 lb (72.1 kg)    GEN: Well nourished, well developed in no acute distress NECK: No JVD; No carotid bruits CARDIAC: ***RRR, no murmurs, rubs, gallops RESPIRATORY:  Clear to auscultation without rales, wheezing or rhonchi  ABDOMEN: Soft, non-tender, non-distended EXTREMITIES:  No edema; No deformity   ASSESSMENT AND PLAN ***    {Are you ordering a CV Procedure (e.g. stress test, cath, DCCV, TEE, etc)?   Press F2        :[id number]}  Dispo: ***  Signed, Rosaline Bane, NP-C "

## 2024-09-15 ENCOUNTER — Ambulatory Visit (HOSPITAL_BASED_OUTPATIENT_CLINIC_OR_DEPARTMENT_OTHER): Admitting: Nurse Practitioner

## 2024-09-16 ENCOUNTER — Encounter (HOSPITAL_COMMUNITY)
Admission: RE | Admit: 2024-09-16 | Discharge: 2024-09-16 | Disposition: A | Discharge status: Home / Self Care | Source: Ambulatory Visit | Attending: Oncology | Admitting: Oncology

## 2024-09-16 DIAGNOSIS — C7B02 Secondary carcinoid tumors of liver: Secondary | ICD-10-CM | POA: Diagnosis present

## 2024-09-16 MED ORDER — COPPER CU 64 DOTATATE 1 MCI/ML IV SOLN
4.0000 | Freq: Once | INTRAVENOUS | Status: AC
  Administered 2024-09-16: 3.8 via INTRAVENOUS

## 2024-09-22 ENCOUNTER — Inpatient Hospital Stay: Attending: Oncology

## 2024-09-22 ENCOUNTER — Inpatient Hospital Stay: Admitting: Oncology

## 2024-09-22 VITALS — BP 159/79 | HR 67 | Temp 97.6°F | Resp 15 | Wt 163.5 lb

## 2024-09-22 DIAGNOSIS — C7B02 Secondary carcinoid tumors of liver: Secondary | ICD-10-CM

## 2024-09-22 NOTE — Progress Notes (Signed)
 " [facility] OFFICE PROGRESS NOTE   Diagnosis: Carcinoid tumor  INTERVAL HISTORY:  [name] returns as scheduled.  She generally feels well.  Left subcostal pain improved after treatment for H. pylori.  She complains of pain below the right scapula for the past few months.  No diarrhea.  She says her AC is intermittently red , but she does not feel hot.  She has intermittent urinary incontinence.  Objective:  Vital signs in last 24 hours:  Blood pressure (!) 159/79, pulse 67, temperature 97.6 F (36.4 C), temperature source Temporal, resp. rate 15, weight 163 lb 8 oz (74.2 kg), SpO2 98%.    Lymphatics: No cervical, supraclavicular, axillary, or inguinal nodes Resp: Lungs clear bilaterally Cardio: Regular rate and rhythm GI: No mass, nontender, no hepatosplenomegaly Vascular: No leg edema Musculoskeletall: No tenderness at the right subscapular region   Lab Results:  Lab Results  Component Value Date   WBC 6.5 08/08/2024   HGB 14.6 08/08/2024   HCT 43.0 08/08/2024   MCV 87.2 08/08/2024   PLT 227 08/08/2024   NEUTROABS 4.5 08/08/2024    CMP  Lab Results  Component Value Date   NA 142 08/08/2024   K 3.8 08/08/2024   CL 107 08/08/2024   CO2 24 08/08/2024   GLUCOSE 88 08/08/2024   BUN 13 08/08/2024   CREATININE 0.80 08/08/2024   CALCIUM 9.2 08/08/2024   PROT 7.2 09/17/2023   ALBUMIN 4.3 09/17/2023   AST 15 09/17/2023   ALT 16 09/17/2023   ALKPHOS 62 09/17/2023   BILITOT 0.5 09/17/2023   GFRNONAA >60 08/08/2024   GFRAA 107 10/21/2019    No results found for: CEA1, CEA, CAN199, CA125  Lab Results  Component Value Date   INR 0.9 04/19/2021   LABPROT 12.0 04/19/2021    Imaging:  No results found.  Medications: I have reviewed the patient's current medications.   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, 2/5 lymph nodes,  tumor invaded through the muscularis into subserosal tissues,pT3pN1 Isolated right liver lesion noted on multiple imaging studies dating back to 2016 CT abdomen/pelvis 03/11/2021-[facility], increased size of right hepatic lesion measuring up to 3.4 cm MRI abdomen 03/25/2021-enhancing right hepatic lesion concerning for malignancy, 3.2 x 3.7 cm Ultrasound-guided biopsy of right liver lesion 04/19/2021-well differentiated neuroendocrine tumor [facility]  05/11/2021-intense activity associated with the right hepatic lobe metastasis, single 11 mm mesenteric nodule with intense radiotracer activity CT abdomen/pelvis 11/15/2021-stable right hepatic metastasis, stable mesenteric lymph node CT abdomen/pelvis 08/16/2022 no acute findings.  Stable right hepatic lobe mass.  Stable borderline enlarged lymph nodes in the small bowel mesentery CT abdomen/pelvis 12/15/2022-solitary right hepatic metastasis-measured slightly larger, stable right lower quadrant mesenteric nodular metastasis Dotatate PET 09/07/2023: Slight enlargement of the right liver metastasis, no new liver lesion, unchanged small bowel mesenteric nodule, single focus of radiotracer activity at the small bowel at the ventral peritoneum beneath the right rectus muscle with no CT correlate no focal activity in the skeleton Dotatate PET 09/16/2024: Unchanged dominant right hepatic lesion, 2 small hepatic lesions are more prominent versus new, stable right central mesenteric lesion and small bowel activity, no dotatate activity in the bones 2.  Left breast DCIS 2014 3.  Hypertension 4.  Tobacco use 5.  CAD/MI 6.  History of a CVA 7.  History of hepatitis C 8.  Colonoscopy 12/27/2022-two 4 mm polyps in the ascending colon, five 3 to 6 mm polyps  in the sigmoid colon, descending colon and rectum-tubular adenoma and hyperplastic polyps 9.  Upper endoscopy 12/27/2022-normal esophagus, 3 cm hiatal hernia, erythematous mucosa in the gastric antrum and gastric body, nodular  mucosa in the first portion of the duodenum-active H. pylori on the gastric biopsy-treated       Disposition: [name] appears unchanged.  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  She appears asymptomatic.  We reviewed the dotatate PET findings and images.  We discussed observation versus beginning somatostatin analog therapy.  She understands the goal of somatostatin therapy is to delay disease progression.  She favors continued observation. We will follow-up on the chromogranin a level from today.  She will return for an office visit and chromogranin A in 6 months.  We will plan for a restaging dotatate PET in 1 year.  I recommended she follow-up with her primary provider to evaluate the urinary incontinence and right back pain.  Arley Hof, MD  09/22/2024  12:34 PM   "

## 2024-09-24 ENCOUNTER — Ambulatory Visit: Payer: Self-pay | Admitting: Oncology

## 2024-09-24 LAB — CHROMOGRANIN A: Chromogranin A (ng/mL): 205.9 ng/mL — ABNORMAL HIGH (ref 0.0–101.8)

## 2024-09-24 NOTE — Telephone Encounter (Signed)
-----   Message from Arley Hof, MD sent at 09/24/2024  2:00 PM EST ----- Please call patient, the chromogranin A is elevated, but not significantly changed over the past 1.5 years, follow-up as scheduled

## 2024-10-07 NOTE — Progress Notes (Unsigned)
" °  Cardiology Office Note   Date:  10/07/2024  ID:  [name], DOB [birth year], MRN [id number] PCP: Jacques Camie Pepper, PA-C  [facility] Providers Cardiologist:  Annabella Scarce, MD { Click to update primary MD,subspecialty MD or APP then REFRESH:1}    PMH Takotsubo cardiomyopathy Hypertension Hyperlipidemia CVA Breast cancer s/p XRT Nonobstructive CAD Tobacco abuse Metastatic malignant carcinoid of liver  Admission 12/2017 following a syncopal episode in the setting of a family argument.  Troponin was positive.  She underwent cath with 10% RCA stenosis and EF 25 to 30% via left ventriculography.  She was placed on low-dose Toprol  but her course was complicated by hypotension.  Echo during that admission revealed LVEF 50 to 55%, G1 DD, hypokinesis of mid to apical inferolateral and inferior myocardium.  She established with Dr. Scarce.  Echo 03/2018 EF 55 to 60%, normal wall motion, grade 1 DD, mild AI.  Coronary CTA 01/25/2022 with minimal CAD, less than 25% stenosis RCA, severe left atrial enlargement and mild right atrial enlargement.  At office visit 06/01/2022 with Reche Finder, NP she reported not feeling good but could not provide further details.  She was on Zetia  and lisinopril  had recently been increased to 20 mg daily because BP in clinic was 180/98.  She was not monitoring home BP.  She unfortunately continued to smoke which frustrated her.  She reported chest discomfort atypical of angina which was under her left breast, constant, and tender on palpation.  She was referred to lipid clinic for management of hyperlipidemia.  She was referred to health coach for smoking cessation.  No additional change in medical therapy.  Last cardiology clinic visit was 08/04/2024 with Kenzie Campbell, NP.  She reported not feeling well, having some central chest tightness, headache, dizziness, and nausea.  On arrival BP was 202/108 and she reported chest tightness and headache  had resolved.  Having SOB that is chronic at baseline, no orthopnea, weight gain, or swelling.  She admitted to food insecurity with reduction of snap benefits and dumpster diving.  She reported she did not want to start PCSK9 inhibitor for management of hyperlipidemia.  She was advised to start amlodipine  5 mg daily and increase lisinopril  to 20 mg twice daily. She was given items from the food pantry and referral to social work was placed. Advised to return in 1 month for follow-up.   History of Present Illness [name] is a 69 y.o. female ***  ROS: ***  Studies Reviewed      ***  No results found for: LIPOA  Risk Assessment/Calculations {Does this patient have ATRIAL FIBRILLATION?:[phone]} No BP recorded.  {Refresh Note OR Click here to enter BP  :1}***       Physical Exam VS:  There were no vitals taken for this visit.   Wt Readings from Last 3 Encounters:  09/22/24 163 lb 8 oz (74.2 kg)  08/08/24 160 lb (72.6 kg)  08/04/24 163 lb (73.9 kg)    GEN: Well nourished, well developed in no acute distress NECK: No JVD; No carotid bruits CARDIAC: ***RRR, no murmurs, rubs, gallops RESPIRATORY:  Clear to auscultation without rales, wheezing or rhonchi  ABDOMEN: Soft, non-tender, non-distended EXTREMITIES:  No edema; No deformity   ASSESSMENT AND PLAN ***    {Are you ordering a CV Procedure (e.g. stress test, cath, DCCV, TEE, etc)?   Press F2        :[id number]}  Dispo: ***  Signed, Rosaline Bane, NP-C "

## 2024-10-09 ENCOUNTER — Ambulatory Visit (HOSPITAL_BASED_OUTPATIENT_CLINIC_OR_DEPARTMENT_OTHER): Admitting: Nurse Practitioner

## 2025-01-08 ENCOUNTER — Ambulatory Visit (HOSPITAL_BASED_OUTPATIENT_CLINIC_OR_DEPARTMENT_OTHER): Admitting: Nurse Practitioner
